# Patient Record
Sex: Female | Born: 1970
Health system: Southern US, Community
[De-identification: ages and names within clinical notes are randomized; demographics above are authoritative.]

## PROBLEM LIST (undated history)

## (undated) DIAGNOSIS — F329 Major depressive disorder, single episode, unspecified: Secondary | ICD-10-CM

## (undated) DIAGNOSIS — I10 Essential (primary) hypertension: Secondary | ICD-10-CM

## (undated) DIAGNOSIS — E119 Type 2 diabetes mellitus without complications: Secondary | ICD-10-CM

## (undated) DIAGNOSIS — G4730 Sleep apnea, unspecified: Secondary | ICD-10-CM

## (undated) DIAGNOSIS — F419 Anxiety disorder, unspecified: Secondary | ICD-10-CM

## (undated) DIAGNOSIS — A048 Other specified bacterial intestinal infections: Secondary | ICD-10-CM

## (undated) DIAGNOSIS — M1990 Unspecified osteoarthritis, unspecified site: Secondary | ICD-10-CM

## (undated) DIAGNOSIS — G2581 Restless legs syndrome: Secondary | ICD-10-CM

## (undated) DIAGNOSIS — K219 Gastro-esophageal reflux disease without esophagitis: Secondary | ICD-10-CM

## (undated) DIAGNOSIS — E785 Hyperlipidemia, unspecified: Secondary | ICD-10-CM

## (undated) DIAGNOSIS — G4700 Insomnia, unspecified: Secondary | ICD-10-CM

## (undated) DIAGNOSIS — F909 Attention-deficit hyperactivity disorder, unspecified type: Secondary | ICD-10-CM

## (undated) DIAGNOSIS — F32A Depression, unspecified: Secondary | ICD-10-CM

## (undated) DIAGNOSIS — M797 Fibromyalgia: Secondary | ICD-10-CM

## (undated) DIAGNOSIS — K589 Irritable bowel syndrome without diarrhea: Secondary | ICD-10-CM

## (undated) DIAGNOSIS — N946 Dysmenorrhea, unspecified: Secondary | ICD-10-CM

## (undated) HISTORY — DX: Fibromyalgia: M79.7

## (undated) HISTORY — DX: Irritable bowel syndrome, unspecified: K58.9

## (undated) HISTORY — DX: Depression, unspecified: F32.A

## (undated) HISTORY — DX: Major depressive disorder, single episode, unspecified: F32.9

## (undated) HISTORY — DX: Type 2 diabetes mellitus without complications: E11.9

## (undated) HISTORY — DX: Sleep apnea, unspecified: G47.30

## (undated) HISTORY — DX: Dysmenorrhea, unspecified: N94.6

## (undated) HISTORY — DX: Restless legs syndrome: G25.81

## (undated) HISTORY — DX: Anxiety disorder, unspecified: F41.9

## (undated) HISTORY — DX: Insomnia, unspecified: G47.00

## (undated) HISTORY — DX: Other specified bacterial intestinal infections: A04.8

## (undated) HISTORY — PX: KNEE ARTHROSCOPY: SHX127

## (undated) HISTORY — DX: Essential (primary) hypertension: I10

## (undated) HISTORY — DX: Hyperlipidemia, unspecified: E78.5

## (undated) HISTORY — DX: Attention-deficit hyperactivity disorder, unspecified type: F90.9

## (undated) HISTORY — DX: Unspecified osteoarthritis, unspecified site: M19.90

## (undated) HISTORY — DX: Gastro-esophageal reflux disease without esophagitis: K21.9

---
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
## 1998-01-18 ENCOUNTER — Inpatient Hospital Stay (HOSPITAL_COMMUNITY): Admission: AD | Admit: 1998-01-18 | Discharge: 1998-01-19 | Payer: Self-pay | Admitting: Obstetrics

## 1998-01-26 ENCOUNTER — Inpatient Hospital Stay (HOSPITAL_COMMUNITY): Admission: RE | Admit: 1998-01-26 | Discharge: 1998-01-27 | Payer: Self-pay | Admitting: Obstetrics

## 1998-01-30 ENCOUNTER — Inpatient Hospital Stay (HOSPITAL_COMMUNITY): Admission: AD | Admit: 1998-01-30 | Discharge: 1998-01-31 | Payer: Self-pay | Admitting: *Deleted

## 1998-02-08 ENCOUNTER — Observation Stay (HOSPITAL_COMMUNITY): Admission: AD | Admit: 1998-02-08 | Discharge: 1998-02-09 | Payer: Self-pay | Admitting: *Deleted

## 1998-02-23 ENCOUNTER — Inpatient Hospital Stay (HOSPITAL_COMMUNITY): Admission: AD | Admit: 1998-02-23 | Discharge: 1998-02-23 | Payer: Self-pay | Admitting: Obstetrics

## 1998-03-15 ENCOUNTER — Inpatient Hospital Stay (HOSPITAL_COMMUNITY): Admission: AD | Admit: 1998-03-15 | Discharge: 1998-03-15 | Payer: Self-pay | Admitting: *Deleted

## 1998-03-17 ENCOUNTER — Encounter: Admission: RE | Admit: 1998-03-17 | Discharge: 1998-06-15 | Payer: Self-pay | Admitting: Obstetrics

## 1998-03-23 ENCOUNTER — Inpatient Hospital Stay (HOSPITAL_COMMUNITY): Admission: AD | Admit: 1998-03-23 | Discharge: 1998-03-23 | Payer: Self-pay | Admitting: Obstetrics

## 1998-03-27 ENCOUNTER — Inpatient Hospital Stay (HOSPITAL_COMMUNITY): Admission: AD | Admit: 1998-03-27 | Discharge: 1998-03-27 | Payer: Self-pay | Admitting: Obstetrics

## 1998-03-30 ENCOUNTER — Observation Stay (HOSPITAL_COMMUNITY): Admission: AD | Admit: 1998-03-30 | Discharge: 1998-03-31 | Payer: Self-pay | Admitting: Obstetrics

## 1998-04-06 ENCOUNTER — Inpatient Hospital Stay (HOSPITAL_COMMUNITY): Admission: AD | Admit: 1998-04-06 | Discharge: 1998-04-06 | Payer: Self-pay | Admitting: Obstetrics

## 1998-04-09 ENCOUNTER — Inpatient Hospital Stay (HOSPITAL_COMMUNITY): Admission: AD | Admit: 1998-04-09 | Discharge: 1998-04-09 | Payer: Self-pay | Admitting: Obstetrics

## 1998-04-12 ENCOUNTER — Inpatient Hospital Stay (HOSPITAL_COMMUNITY): Admission: AD | Admit: 1998-04-12 | Discharge: 1998-04-12 | Payer: Self-pay | Admitting: *Deleted

## 1998-04-17 ENCOUNTER — Inpatient Hospital Stay (HOSPITAL_COMMUNITY): Admission: AD | Admit: 1998-04-17 | Discharge: 1998-04-17 | Payer: Self-pay | Admitting: *Deleted

## 1998-04-18 ENCOUNTER — Observation Stay (HOSPITAL_COMMUNITY): Admission: AD | Admit: 1998-04-18 | Discharge: 1998-04-19 | Payer: Self-pay | Admitting: Obstetrics

## 1998-04-23 ENCOUNTER — Inpatient Hospital Stay (HOSPITAL_COMMUNITY): Admission: AD | Admit: 1998-04-23 | Discharge: 1998-04-23 | Payer: Self-pay | Admitting: Obstetrics

## 1998-04-29 ENCOUNTER — Inpatient Hospital Stay (HOSPITAL_COMMUNITY): Admission: AD | Admit: 1998-04-29 | Discharge: 1998-04-29 | Payer: Self-pay | Admitting: *Deleted

## 1998-04-30 ENCOUNTER — Inpatient Hospital Stay (HOSPITAL_COMMUNITY): Admission: AD | Admit: 1998-04-30 | Discharge: 1998-04-30 | Payer: Self-pay | Admitting: Obstetrics & Gynecology

## 1998-05-04 ENCOUNTER — Encounter (HOSPITAL_COMMUNITY): Admission: RE | Admit: 1998-05-04 | Discharge: 1998-05-09 | Payer: Self-pay | Admitting: Obstetrics

## 1998-05-06 ENCOUNTER — Inpatient Hospital Stay (HOSPITAL_COMMUNITY): Admission: AD | Admit: 1998-05-06 | Discharge: 1998-05-06 | Payer: Self-pay | Admitting: Obstetrics & Gynecology

## 1998-05-06 ENCOUNTER — Inpatient Hospital Stay (HOSPITAL_COMMUNITY): Admission: AD | Admit: 1998-05-06 | Discharge: 1998-05-10 | Payer: Self-pay | Admitting: Obstetrics & Gynecology

## 2001-05-19 ENCOUNTER — Other Ambulatory Visit: Admission: RE | Admit: 2001-05-19 | Discharge: 2001-05-19 | Payer: Self-pay | Admitting: Family Medicine

## 2002-03-13 ENCOUNTER — Encounter: Admission: RE | Admit: 2002-03-13 | Discharge: 2002-03-13 | Payer: Self-pay | Admitting: Family Medicine

## 2002-03-13 ENCOUNTER — Encounter: Payer: Self-pay | Admitting: Family Medicine

## 2002-12-17 HISTORY — PX: DILATION AND CURETTAGE OF UTERUS: SHX78

## 2003-05-05 ENCOUNTER — Encounter: Payer: Self-pay | Admitting: Family Medicine

## 2003-05-05 ENCOUNTER — Encounter: Admission: RE | Admit: 2003-05-05 | Discharge: 2003-05-05 | Payer: Self-pay | Admitting: Family Medicine

## 2003-06-12 ENCOUNTER — Inpatient Hospital Stay (HOSPITAL_COMMUNITY): Admission: AD | Admit: 2003-06-12 | Discharge: 2003-06-12 | Payer: Self-pay | Admitting: Obstetrics & Gynecology

## 2003-06-13 ENCOUNTER — Inpatient Hospital Stay (HOSPITAL_COMMUNITY): Admission: AD | Admit: 2003-06-13 | Discharge: 2003-06-13 | Payer: Self-pay | Admitting: Obstetrics & Gynecology

## 2003-06-20 ENCOUNTER — Inpatient Hospital Stay (HOSPITAL_COMMUNITY): Admission: AD | Admit: 2003-06-20 | Discharge: 2003-06-21 | Payer: Self-pay | Admitting: Obstetrics

## 2003-06-21 ENCOUNTER — Encounter: Payer: Self-pay | Admitting: Obstetrics

## 2003-06-28 ENCOUNTER — Inpatient Hospital Stay (HOSPITAL_COMMUNITY): Admission: AD | Admit: 2003-06-28 | Discharge: 2003-06-30 | Payer: Self-pay | Admitting: Obstetrics & Gynecology

## 2003-06-29 ENCOUNTER — Encounter: Payer: Self-pay | Admitting: Obstetrics & Gynecology

## 2003-07-05 ENCOUNTER — Encounter (INDEPENDENT_AMBULATORY_CARE_PROVIDER_SITE_OTHER): Payer: Self-pay | Admitting: Specialist

## 2003-07-05 ENCOUNTER — Observation Stay (HOSPITAL_COMMUNITY): Admission: RE | Admit: 2003-07-05 | Discharge: 2003-07-06 | Payer: Self-pay | Admitting: Obstetrics & Gynecology

## 2003-10-07 ENCOUNTER — Emergency Department (HOSPITAL_COMMUNITY): Admission: EM | Admit: 2003-10-07 | Discharge: 2003-10-07 | Payer: Self-pay | Admitting: Emergency Medicine

## 2004-07-19 ENCOUNTER — Ambulatory Visit (HOSPITAL_COMMUNITY): Admission: RE | Admit: 2004-07-19 | Discharge: 2004-07-19 | Payer: Self-pay | Admitting: Obstetrics & Gynecology

## 2004-07-25 ENCOUNTER — Ambulatory Visit (HOSPITAL_COMMUNITY): Admission: RE | Admit: 2004-07-25 | Discharge: 2004-07-25 | Payer: Self-pay | Admitting: Gastroenterology

## 2004-10-12 ENCOUNTER — Encounter: Admission: RE | Admit: 2004-10-12 | Discharge: 2004-10-12 | Payer: Self-pay | Admitting: Rheumatology

## 2006-05-26 ENCOUNTER — Ambulatory Visit (HOSPITAL_BASED_OUTPATIENT_CLINIC_OR_DEPARTMENT_OTHER): Admission: RE | Admit: 2006-05-26 | Discharge: 2006-05-26 | Payer: Self-pay | Admitting: Family Medicine

## 2006-06-02 ENCOUNTER — Ambulatory Visit: Payer: Self-pay | Admitting: Internal Medicine

## 2006-12-17 HISTORY — PX: BUNIONECTOMY: SHX129

## 2007-04-08 ENCOUNTER — Inpatient Hospital Stay (HOSPITAL_COMMUNITY): Admission: AD | Admit: 2007-04-08 | Discharge: 2007-04-08 | Payer: Self-pay | Admitting: Obstetrics & Gynecology

## 2007-07-15 ENCOUNTER — Emergency Department (HOSPITAL_COMMUNITY): Admission: EM | Admit: 2007-07-15 | Discharge: 2007-07-16 | Payer: Self-pay | Admitting: Emergency Medicine

## 2008-06-06 ENCOUNTER — Inpatient Hospital Stay (HOSPITAL_COMMUNITY): Admission: AD | Admit: 2008-06-06 | Discharge: 2008-06-07 | Payer: Self-pay | Admitting: Obstetrics & Gynecology

## 2009-03-14 ENCOUNTER — Encounter: Admission: RE | Admit: 2009-03-14 | Discharge: 2009-03-14 | Payer: Self-pay | Admitting: Family Medicine

## 2009-05-11 ENCOUNTER — Other Ambulatory Visit: Admission: RE | Admit: 2009-05-11 | Discharge: 2009-05-11 | Payer: Self-pay | Admitting: Family Medicine

## 2010-07-25 ENCOUNTER — Emergency Department (HOSPITAL_COMMUNITY): Admission: EM | Admit: 2010-07-25 | Discharge: 2010-07-26 | Payer: Self-pay | Admitting: Emergency Medicine

## 2011-01-07 ENCOUNTER — Encounter: Payer: Self-pay | Admitting: Family Medicine

## 2011-01-07 ENCOUNTER — Encounter: Payer: Self-pay | Admitting: Obstetrics & Gynecology

## 2011-03-02 LAB — COMPREHENSIVE METABOLIC PANEL
ALT: 17 U/L (ref 0–35)
AST: 17 U/L (ref 0–37)
Albumin: 3.8 g/dL (ref 3.5–5.2)
Alkaline Phosphatase: 82 U/L (ref 39–117)
BUN: 12 mg/dL (ref 6–23)
CO2: 24 mEq/L (ref 19–32)
Calcium: 9 mg/dL (ref 8.4–10.5)
Chloride: 106 mEq/L (ref 96–112)
Creatinine, Ser: 0.75 mg/dL (ref 0.4–1.2)
GFR calc Af Amer: >60 mL/min (ref >60)
GFR calc non Af Amer: >60 mL/min (ref >60)
Glucose, Bld: 123 mg/dL — ABNORMAL HIGH (ref 70–99)
Potassium: 3.6 mEq/L (ref 3.5–5.1)
Sodium: 139 mEq/L (ref 135–145)
Total Bilirubin: 0.4 mg/dL (ref 0.3–1.2)
Total Protein: 7.5 g/dL (ref 6.0–8.3)

## 2011-03-02 LAB — URINALYSIS, ROUTINE W REFLEX MICROSCOPIC
Bilirubin Urine: NEGATIVE
Glucose, UA: NEGATIVE mg/dL
Ketones, ur: NEGATIVE mg/dL
Nitrite: NEGATIVE
Protein, ur: NEGATIVE mg/dL
Specific Gravity, Urine: 1.029 (ref 1.005–1.030)
Urobilinogen, UA: 1 mg/dL (ref 0.0–1.0)
pH: 6 (ref 5.0–8.0)

## 2011-03-02 LAB — WET PREP, GENITAL
Trich, Wet Prep: NONE SEEN
Yeast Wet Prep HPF POC: NONE SEEN

## 2011-03-02 LAB — CBC
HCT: 41.4 % (ref 36.0–46.0)
Hemoglobin: 14.1 g/dL (ref 12.0–15.0)
MCH: 28.5 pg (ref 26.0–34.0)
MCHC: 34 g/dL (ref 30.0–36.0)
MCV: 83.8 fL (ref 78.0–100.0)
Platelets: 300 10*3/uL (ref 150–400)
RBC: 4.94 MIL/uL (ref 3.87–5.11)
RDW: 15.2 % (ref 11.5–15.5)
WBC: 7.3 10*3/uL (ref 4.0–10.5)

## 2011-03-02 LAB — URINE MICROSCOPIC-ADD ON

## 2011-03-02 LAB — DIFFERENTIAL
Basophils Absolute: 0 10*3/uL (ref 0.0–0.1)
Basophils Relative: 0 % (ref 0–1)
Eosinophils Absolute: 0.1 10*3/uL (ref 0.0–0.7)
Eosinophils Relative: 1 % (ref 0–5)
Lymphocytes Relative: 38 % (ref 12–46)
Lymphs Abs: 2.8 10*3/uL (ref 0.7–4.0)
Monocytes Absolute: 0.5 10*3/uL (ref 0.1–1.0)
Monocytes Relative: 7 % (ref 3–12)
Neutro Abs: 3.8 10*3/uL (ref 1.7–7.7)
Neutrophils Relative %: 53 % (ref 43–77)

## 2011-03-02 LAB — PREGNANCY, URINE: Preg Test, Ur: NEGATIVE

## 2011-03-02 LAB — LIPASE, BLOOD: Lipase: 33 U/L (ref 11–59)

## 2011-03-02 LAB — GC/CHLAMYDIA PROBE AMP, GENITAL
Chlamydia, DNA Probe: NEGATIVE
GC Probe Amp, Genital: NEGATIVE

## 2011-05-04 NOTE — Op Note (Signed)
   NAMEZOHA, Goodman                           ACCOUNT NO.:  [id number]   MEDICAL RECORD NO.:  [id number]                   PATIENT TYPE:  OBV   LOCATION:  9308                                 FACILITY:  [facility]   PHYSICIAN:  Roseanna Rainbow, M.D.         DATE OF BIRTH:  [birth year]   DATE OF PROCEDURE:  07/05/2003  DATE OF DISCHARGE:                                 OPERATIVE REPORT   PREOPERATIVE DIAGNOSIS:  Embryonic demise.   POSTOPERATIVE DIAGNOSIS:  Embryonic demise.   PROCEDURE:  Suction dilatation and curettage.   SURGEON:  Roseanna Rainbow, M.D.   ANESTHESIA:  Managed anesthesia care, paracervical block.   ESTIMATED BLOOD LOSS:  Approximately 50 mL.   COMPLICATIONS:  None.   PROCEDURE:  The patient was taken to the operating room where she was placed  in the dorsal lithotomy position and prepped and draped in the usual sterile  fashion.  A sterile speculum was then placed inside the patient's vagina.  The anterior lip of the cervix was infiltrated with 2 mL of 2% Xylocaine.  The single-tooth tenaculum was then applied to this location.  Four mL of 2%  Xylocaine were then injected at 5 and 7 o'clock to use as a paracervical  block.  The cervix was then dilated with the [facility] dilators.  A 7-mm suction  curette was then advanced into the intrauterine cavity.  The device was  activated and rotated to evacuate the uterus of the products of conception.  The sharp curettage was then performed until a gritty texture was noted.  The suction curette was then reintroduced into the intrauterine cavity and  the remaining products of conception were evacuated.  The single-tooth  tenaculum was then removed with minimal bleeding noted.  At the closure of  the procedure the instrument and pack counts were said to be correct x2.  The patient was taken to the PACU awake and  in stable condition.   PATHOLOGY:  Products of conception.        Roseanna Rainbow, M.D.    [name]  D:  07/05/2003  T:  07/05/2003  Job:  [id number]

## 2011-05-04 NOTE — Discharge Summary (Signed)
[name], [name]                           ACCOUNT NO.:  [id number]   MEDICAL RECORD NO.:  [id number]                   PATIENT TYPE:  INP   LOCATION:  9104                                 FACILITY:  [facility]   PHYSICIAN:  Roseanna Rainbow, M.D.         DATE OF BIRTH:  Jan 27, 1971   DATE OF ADMISSION:  06/28/2003  DATE OF DISCHARGE:  06/30/2003                                 DISCHARGE SUMMARY   CHIEF COMPLAINT:  The patient is a 40 year old, gravida 3, para 1-0-1-1, at  17 and 3/7ths week estimated gestational age with complaints of nausea and  vomiting.   HISTORY OF PRESENT ILLNESS:  The patient also complains of a headache.  Prenatal course, source of care [facility], pregnancy complications and risks,  nausea and vomiting of pregnancy with multiple presentations to maternity  and admissions with dehydration.   MEDICATIONS:  Phenergan.   ALLERGIES:  NO KNOWN DRUG ALLERGIES.   PAST OBSTETRIC AND GYNECOLOGIC HISTORY:  She is status post one NSVD and one  spontaneous abortion.   PAST MEDICAL HISTORY:  She denies.   PAST SURGICAL HISTORY:  She denies.   FAMILY AND SOCIAL HISTORY:  No tobacco, ethanol, or illicit drug use.   The most recent ultrasound at 8 weeks, 1 day with questionable viability.   PHYSICAL EXAMINATION:  VITAL SIGNS:  Temperature 97.7, pulse 88, respiratory  rate 20, blood pressure 114/63.  GENERAL:  Appears fatigued.  ABDOMEN:  Nontender.  PELVIC:  Deferred.  EXTREMITIES:  Nontender.   LABORATORY WORK:  White blood cell count 5600, hemoglobin 14.2.  Complete  metabolic profile normal.  Urinalysis with a specific gravity of 1.030, and  ketones greater than 80.   ASSESSMENT:  1. Hyperemesis gravidarum with mild dehydration refractory to current     antiemetics.  2. Questionable viable early intrauterine pregnancy.   PLAN:  Admit.  Steroid taper, and repeat the ultrasound.   HOSPITAL COURSE:  The patient was admitted.  Lab work from the office  revealed a positive H. pylori result.  The ultrasound revealed a nonviable  intrauterine pregnancy.  There were no findings consistent with a molar  pregnancy.  A beta hCG was 72,000.  Blood type was noted to be B+.  Her  nausea and vomiting had improved significantly, and she was tolerating a  regular diet.  The steroid taper was discontinued.  The plan was to  discharge her home with treatment for H. pylori as follows - Prilosec 20 mg  p.o. b.i.d., amoxicillin 1 gm p.o. b.i.d., Biaxin 500 mg p.o. b.i.d.  This  regimen was to be taken for 14 days.   DISCHARGE DIAGNOSIS:  Hyperemesis gravidarum.  Nonviable early intrauterine  pregnancy consistent with an embryonic demise.   CONDITION:  Stable.   DIET:  Regular.   ACTIVITY:  Ad lib.   MEDICATIONS:  1. Amoxicillin.  2. Biaxin.  3. Prilosec.   DISPOSITION:  The patient  was to follow up in the office in four weeks.                                               Roseanna Rainbow, M.D.    [name]  D:  08/08/2003  T:  08/08/2003  Job:  [id number]

## 2011-05-04 NOTE — Procedures (Signed)
[name], BAU NO.:  [id number]   MEDICAL RECORD NO.:  [id number]          PATIENT TYPE:  OUT   LOCATION:  SLEEP CENTER                 [facility]ACILITY[facility]   PHYSICIAN:  Clinton D. Maple Hudson, M.D. DATE OF BIRTH:  16-Jan-1971   DATE OF STUDY:  05/26/2006                              NOCTURNAL POLYSOMNOGRAM   REFERRING PHYSICIAN:  Dr. Elias Else   INDICATIONS FOR STUDY:  Hypersomnia with sleep apnea.   EPWORTH SLEEPINESS SCORE:  17/24.   BMI:  34.   WEIGHT:  212 pounds.   HOME MEDICATIONS:  Lexapro, Wellbutrin, D-amphetamine.   SLEEP ARCHITECTURE:  Total sleep time 432 minutes with sleep efficiency 91%.  Stage I was 5%, stage II 74%, stages III and IV 3%, REM 18% of total sleep  time.  Sleep latency 16 minutes, REM latency 83 minutes, awake after sleep  onset 28 minutes, arousal index 16.1.   RESPIRATORY DATA:  Apnea/hypopnea index (AHI, RDI) 2.2 obstructive events  per hour which is within normal limits (normal range 0 to 5 per hour).  This  reflected a total of 16 hypopneas.  Most events occurred while sleeping  supine (AHI 5.0) suggesting positional relationship.  REM AHI was 0.8 per  hour.  There were insufficient events to qualify for CPAP titration by split  protocol on the study night.   OXYGEN DATA:  Moderately loud snoring with oxygen desaturation to a nadir of  90%.  Mean oxygen saturation through the study was 97% on room air.   CARDIAC DATA:  Normal sinus rhythm.   MOVEMENT/PARASOMNIA:  A total of 50 limb jerks were recorded of which five  were associated with arousal or awakening for periodic limb movement with  arousal index of 0.7 per hour which is insignificant.   IMPRESSION/RECOMMENDATIONS:  1.  Overall sleep architecture was unremarkable.  There is some mild tossing      and turning with sleep fragmentation but this study does not suggest a      significant sleep disorder as the basis for her sleep complaints.      Effect on sleep of  her medications and adequacy of sleep hygiene at home      should be discussed.  2.  Occasional sleep disordered breathing events, a AHI 2.2 per hour which      is within normal limits (normal range 0 to 5 per hour).      Clinton D. Maple Hudson, M.D.  Diplomate, Biomedical engineer of Sleep Medicine  Electronically Signed     CDY/MEDQ  D:  06/02/2006 08:58:55  T:  06/03/2006 10:00:05  Job:  [id number]

## 2011-06-06 ENCOUNTER — Emergency Department (HOSPITAL_COMMUNITY)
Admission: EM | Admit: 2011-06-06 | Discharge: 2011-06-06 | Disposition: A | Discharge status: Home / Self Care | Payer: Medicare Other | Attending: Emergency Medicine | Admitting: Emergency Medicine

## 2011-06-06 DIAGNOSIS — R109 Unspecified abdominal pain: Secondary | ICD-10-CM | POA: Insufficient documentation

## 2011-06-06 DIAGNOSIS — M62838 Other muscle spasm: Secondary | ICD-10-CM | POA: Insufficient documentation

## 2011-06-06 DIAGNOSIS — F341 Dysthymic disorder: Secondary | ICD-10-CM | POA: Insufficient documentation

## 2011-06-06 LAB — POCT PREGNANCY, URINE: Preg Test, Ur: NEGATIVE

## 2011-06-06 LAB — URINALYSIS, ROUTINE W REFLEX MICROSCOPIC
Bilirubin Urine: NEGATIVE
Glucose, UA: NEGATIVE mg/dL
Hgb urine dipstick: NEGATIVE
Ketones, ur: NEGATIVE mg/dL
Leukocytes, UA: NEGATIVE
Nitrite: NEGATIVE
Protein, ur: NEGATIVE mg/dL
Specific Gravity, Urine: 1.033 — ABNORMAL HIGH (ref 1.005–1.030)
Urobilinogen, UA: 1 mg/dL (ref 0.0–1.0)
pH: 5 (ref 5.0–8.0)

## 2011-09-05 ENCOUNTER — Telehealth: Payer: Self-pay | Admitting: Gastroenterology

## 2011-09-05 NOTE — Telephone Encounter (Signed)
Pt having problems swallowing. Pt scheduled to see Willette Cluster NP 09/06/11@8 :Neta Ehlers will notify pt of appt date and time and fax records.

## 2011-09-06 ENCOUNTER — Ambulatory Visit: Payer: Medicare Other | Admitting: Nurse Practitioner

## 2011-09-13 LAB — CBC
HCT: 40.1
Hemoglobin: 13.6
MCHC: 33.9
MCV: 88.4
Platelets: 293
RBC: 4.53
RDW: 14.4
WBC: 7.2

## 2011-09-13 LAB — WET PREP, GENITAL
Clue Cells Wet Prep HPF POC: NONE SEEN
Trich, Wet Prep: NONE SEEN
Yeast Wet Prep HPF POC: NONE SEEN

## 2011-09-13 LAB — POCT PREGNANCY, URINE
Operator id: 223331
Preg Test, Ur: NEGATIVE

## 2011-09-13 LAB — DIFFERENTIAL
Basophils Absolute: 0.1
Basophils Relative: 1
Eosinophils Absolute: 0.1
Eosinophils Relative: 2
Lymphocytes Relative: 42
Lymphs Abs: 3
Monocytes Absolute: 0.5
Monocytes Relative: 6
Neutro Abs: 3.6
Neutrophils Relative %: 50

## 2011-09-13 LAB — URINALYSIS, ROUTINE W REFLEX MICROSCOPIC
Bilirubin Urine: NEGATIVE
Glucose, UA: NEGATIVE
Hgb urine dipstick: NEGATIVE
Ketones, ur: NEGATIVE
Nitrite: NEGATIVE
Protein, ur: NEGATIVE
Specific Gravity, Urine: 1.015
Urobilinogen, UA: 1
pH: 7

## 2011-09-13 LAB — GC/CHLAMYDIA PROBE AMP, GENITAL
Chlamydia, DNA Probe: NEGATIVE
GC Probe Amp, Genital: NEGATIVE

## 2011-10-01 LAB — URINALYSIS, ROUTINE W REFLEX MICROSCOPIC
Glucose, UA: NEGATIVE
Hgb urine dipstick: NEGATIVE
Nitrite: NEGATIVE
Protein, ur: NEGATIVE
Specific Gravity, Urine: 1.024
Urobilinogen, UA: 2 — ABNORMAL HIGH
pH: 7.5

## 2011-10-01 LAB — COMPREHENSIVE METABOLIC PANEL
ALT: 19
AST: 21
Albumin: 3.2 — ABNORMAL LOW
Alkaline Phosphatase: 75
BUN: 4 — ABNORMAL LOW
CO2: 26
Calcium: 8 — ABNORMAL LOW
Chloride: 101
Creatinine, Ser: 0.7
GFR calc Af Amer: >60
GFR calc non Af Amer: >60
Glucose, Bld: 99
Potassium: 3.4 — ABNORMAL LOW
Sodium: 134 — ABNORMAL LOW
Total Bilirubin: 0.9
Total Protein: 6.7

## 2011-10-01 LAB — DIFFERENTIAL
Basophils Absolute: 0
Basophils Relative: 0
Eosinophils Absolute: 0
Eosinophils Relative: 0
Lymphocytes Relative: 20
Lymphs Abs: 0.9
Monocytes Absolute: 0.3
Monocytes Relative: 8
Neutro Abs: 3.2
Neutrophils Relative %: 72

## 2011-10-01 LAB — LIPASE, BLOOD: Lipase: 16

## 2011-10-01 LAB — CBC
HCT: 39.5
Hemoglobin: 13.7
MCHC: 34.8
MCV: 82.6
Platelets: 259
RBC: 4.77
RDW: 13.5
WBC: 4.5

## 2011-11-14 ENCOUNTER — Encounter: Payer: Self-pay | Admitting: Gastroenterology

## 2011-11-14 ENCOUNTER — Ambulatory Visit (INDEPENDENT_AMBULATORY_CARE_PROVIDER_SITE_OTHER): Payer: Medicare Other | Admitting: Gastroenterology

## 2011-11-14 DIAGNOSIS — K219 Gastro-esophageal reflux disease without esophagitis: Secondary | ICD-10-CM | POA: Insufficient documentation

## 2011-11-14 DIAGNOSIS — R1310 Dysphagia, unspecified: Secondary | ICD-10-CM

## 2011-11-14 DIAGNOSIS — R1314 Dysphagia, pharyngoesophageal phase: Secondary | ICD-10-CM | POA: Insufficient documentation

## 2011-11-14 MED ORDER — PANTOPRAZOLE SODIUM 40 MG PO TBEC: 40.0000 mg | DELAYED_RELEASE_TABLET | Freq: Every day | ORAL | Status: DC

## 2011-11-14 NOTE — Assessment & Plan Note (Signed)
This likely is due to a peptic stricture.  Recommendations #1 upper endoscopy with dilatation as indicated  Risks, alternatives, and complications of the procedure, including bleeding, perforation, and possible need for surgery, were explained to the patient.  Patient's questions were answered.

## 2011-11-14 NOTE — Assessment & Plan Note (Signed)
Plan to begin Protonix 40 mg daily 

## 2011-11-14 NOTE — Patient Instructions (Signed)
Your procedure has been scheduled for 11/16/2011, please follow the seperate instructions.  CC: [name]

## 2011-11-14 NOTE — Progress Notes (Signed)
History of Present Illness: [name] is a pleasant 40 year old African female,  with history of gastroesophageal reflux,  referred at the request of Dr. Aquilla Solian for evaluation of dysphagia. She is complaining of dysphagia to solids. When this occurs she may have moderate chest discomfort. She's also complaining of severe pyrosis. Weight has been stable. She is on no gastric irritants.    Past Medical History  Diagnosis Date  . ADD (attention deficit disorder with hyperactivity)   . Anxiety   . Allergic rhinitis   . GERD (gastroesophageal reflux disease)   . Insomnia   . Dysmenorrhea   . Fibromyalgia   . Restless leg syndrome    Past Surgical History  Procedure Date  . Ear cyst excision 1983    Left  . Bunionectomy 2008  . Dilation and curettage of uterus 2004   family history includes Breast cancer in her maternal grandmother; Diabetes in her sister; and Heart disease in her brother.  There is no history of Colon cancer. Current Outpatient Prescriptions  Medication Sig Dispense Refill  . cyclobenzaprine (FLEXERIL) 10 MG tablet Take 10 mg by mouth 3 (three) times daily as needed.        . DULoxetine (CYMBALTA) 30 MG capsule Take 30 mg by mouth 2 (two) times daily.        . ergocalciferol (VITAMIN D2) 50000 UNITS capsule Take 50,000 Units by mouth once a week. Once a week.       . lidocaine (LIDODERM) 5 % Place 1 patch onto the skin daily. Remove & Discard patch within 12 hours or as directed by MD       . methocarbamol (ROBAXIN) 500 MG tablet Take 500 mg by mouth 2 (two) times daily. 1 tab twice a day.      . traMADol (ULTRAM) 50 MG tablet Take 50 mg by mouth every 6 (six) hours as needed.         Allergies as of 11/14/2011  . (No Known Allergies)    reports that she has quit smoking. She has never used smokeless tobacco. She reports that she does not drink alcohol or use illicit drugs.     Review of Systems: Pertinent positive and negative review of systems were noted  in the above HPI section. All other review of systems were otherwise negative.  Vital signs were reviewed in today's medical record Physical Exam: General: Well developed , well nourished, no acute distress Head: Normocephalic and atraumatic Eyes:  sclerae anicteric, EOMI Ears: Normal auditory acuity Mouth: No deformity or lesions Neck: Supple, no masses or thyromegaly Lungs: Clear throughout to auscultation Heart: Regular rate and rhythm; no murmurs, rubs or bruits Abdomen: Soft, non tender and non distended. No masses, hepatosplenomegaly or hernias noted. Normal Bowel sounds Rectal:deferred Musculoskeletal: Symmetrical with no gross deformities  Skin: No lesions on visible extremities Pulses:  Normal pulses noted Extremities: No clubbing, cyanosis, edema or deformities noted Neurological: Alert oriented x 4, grossly nonfocal Cervical Nodes:  No significant cervical adenopathy Inguinal Nodes: No significant inguinal adenopathy Psychological:  Alert and cooperative. Normal mood and affect

## 2011-11-16 ENCOUNTER — Encounter: Payer: Self-pay | Admitting: Gastroenterology

## 2011-11-16 ENCOUNTER — Ambulatory Visit (AMBULATORY_SURGERY_CENTER): Payer: Medicare Other | Admitting: Gastroenterology

## 2011-11-16 DIAGNOSIS — K219 Gastro-esophageal reflux disease without esophagitis: Secondary | ICD-10-CM

## 2011-11-16 DIAGNOSIS — D131 Benign neoplasm of stomach: Secondary | ICD-10-CM

## 2011-11-16 DIAGNOSIS — K2960 Other gastritis without bleeding: Secondary | ICD-10-CM

## 2011-11-16 DIAGNOSIS — R1310 Dysphagia, unspecified: Secondary | ICD-10-CM

## 2011-11-16 DIAGNOSIS — A048 Other specified bacterial intestinal infections: Secondary | ICD-10-CM

## 2011-11-16 MED ORDER — SODIUM CHLORIDE 0.9 % IV SOLN: 500.0000 mL | INTRAVENOUS | Status: DC

## 2011-11-16 NOTE — Patient Instructions (Signed)
Please refer to blue and green discharge instruction sheets. 

## 2011-11-16 NOTE — Progress Notes (Signed)
Patient did not experience any of the following events: a burn prior to discharge; a fall within the facility; wrong site/side/patient/procedure/implant event; or a hospital transfer or hospital admission upon discharge from the facility. (G8907) Patient did not have preoperative order for IV antibiotic SSI prophylaxis. (G8918)  

## 2011-11-19 ENCOUNTER — Telehealth: Payer: Self-pay | Admitting: *Deleted

## 2011-11-19 NOTE — Telephone Encounter (Signed)
LMOM

## 2011-11-27 ENCOUNTER — Encounter: Payer: Self-pay | Admitting: *Deleted

## 2011-11-28 ENCOUNTER — Telehealth: Payer: Self-pay

## 2011-11-28 MED ORDER — AMOXICILL-CLARITHRO-LANSOPRAZ PO MISC: Freq: Two times a day (BID) | ORAL | Status: DC

## 2011-11-28 NOTE — Telephone Encounter (Signed)
Letter from: Solon Palm,  Needs prevpak    Pt aware and rx sent to pharmacy.

## 2011-11-29 ENCOUNTER — Telehealth: Payer: Self-pay | Admitting: *Deleted

## 2011-11-29 NOTE — Telephone Encounter (Signed)
Dr Arlyce Dice, Pt cant afford Prevpac. Can she have Pylera??

## 2011-11-30 MED ORDER — BIS SUBCIT-METRONID-TETRACYC 140-125-125 MG PO CAPS: 3.0000 | ORAL_CAPSULE | Freq: Four times a day (QID) | ORAL | Status: DC

## 2011-11-30 MED ORDER — OMEPRAZOLE 20 MG PO CPDR: 20.0000 mg | DELAYED_RELEASE_CAPSULE | Freq: Two times a day (BID) | ORAL | Status: DC

## 2011-11-30 NOTE — Telephone Encounter (Signed)
Try pylera aureus the medicines prescribed individually

## 2011-11-30 NOTE — Telephone Encounter (Signed)
Resent in Pylera and omeprazole for pt Informed pt that new med was sent for her

## 2011-12-04 ENCOUNTER — Telehealth: Payer: Self-pay | Admitting: *Deleted

## 2011-12-04 NOTE — Telephone Encounter (Signed)
Left message for pt that h we have done a prior authorization for Prevpac. They would not approve Pylera It is in coverage review now

## 2011-12-05 MED ORDER — CLARITHROMYCIN 500 MG PO TABS: 500.0000 mg | ORAL_TABLET | Freq: Two times a day (BID) | ORAL | Status: AC

## 2011-12-05 MED ORDER — AMOXICILLIN 500 MG PO CAPS: 500.0000 mg | ORAL_CAPSULE | Freq: Two times a day (BID) | ORAL | Status: AC

## 2011-12-05 NOTE — Telephone Encounter (Signed)
Did not apporve prevac. Had to order the medication separate. Will see if this goes through

## 2011-12-06 NOTE — Telephone Encounter (Signed)
Pt was approved for her medication separately,  Pt called and told me she has it

## 2011-12-21 ENCOUNTER — Telehealth: Payer: Self-pay | Admitting: Gastroenterology

## 2011-12-21 NOTE — Telephone Encounter (Signed)
Pt states she is having difficulty swallowing again. Pt scheduled to see Dr. Arlyce Dice 12/26/11@12noon .

## 2011-12-26 ENCOUNTER — Encounter: Payer: Self-pay | Admitting: Gastroenterology

## 2011-12-26 ENCOUNTER — Ambulatory Visit (INDEPENDENT_AMBULATORY_CARE_PROVIDER_SITE_OTHER): Payer: Medicare Other | Admitting: Gastroenterology

## 2011-12-26 VITALS — BP 110/80 | HR 84 | Ht 66.0 in | Wt 230.6 lb

## 2011-12-26 DIAGNOSIS — R1310 Dysphagia, unspecified: Secondary | ICD-10-CM

## 2011-12-26 NOTE — Progress Notes (Signed)
History of Present Illness:  [name] has returned following upper endoscopy with dilatation. No frank stricture was seen although the patient noted significant improvement in her dysphagia to solids and to pills. A hyperplastic polyp was seen in the stomach. She was treated with  Prevpac for H. pylori. She is now complaining of dysphagia to pills. This is a significant problem. She feels like food is stuck in  her neck. She is without abdominal pain or chest pain    Review of Systems: Pertinent positive and negative review of systems were noted in the above HPI section. All other review of systems were otherwise negative.    Current Medications, Allergies, Past Medical History, Past Surgical History, Family History and Social History were reviewed in [employer] electronic medical record  Vital signs were reviewed in today's medical record. Physical Exam: General: Well developed , well nourished, no acute distress

## 2011-12-26 NOTE — Assessment & Plan Note (Addendum)
Recurrent dysphagia is likely related to a stricture. A motility disorder is less likely.  Recommendations #1 repeat endoscopy with dilatation. If she continues to have problems then I would proceed with barium swallow and possible manometry.

## 2011-12-26 NOTE — Patient Instructions (Signed)
Your procedure has been scheduled for 01/01/2012, please follow the seperate instructions.

## 2012-01-01 ENCOUNTER — Encounter: Payer: Self-pay | Admitting: Gastroenterology

## 2012-01-01 ENCOUNTER — Ambulatory Visit (AMBULATORY_SURGERY_CENTER): Payer: Medicare Other | Admitting: Gastroenterology

## 2012-01-01 VITALS — BP 132/70 | HR 79 | Temp 98.1°F | Resp 21 | Ht 66.0 in | Wt 230.0 lb

## 2012-01-01 DIAGNOSIS — R1310 Dysphagia, unspecified: Secondary | ICD-10-CM

## 2012-01-01 NOTE — Progress Notes (Signed)
Patient did not experience any of the following events: a burn prior to discharge; a fall within the facility; wrong site/side/patient/procedure/implant event; or a hospital transfer or hospital admission upon discharge from the facility. (G8907) Patient did not have preoperative order for IV antibiotic SSI prophylaxis. (G8918)  

## 2012-01-01 NOTE — Op Note (Signed)
[facility] [address] [place], [place]  [address]  ENDOSCOPY PROCEDURE REPORT  PATIENT:  [name], [name]  MR#:  [id number] BIRTHDATE:  06-13-1971, 40 yrs. old  GENDER:  female  ENDOSCOPIST:  Barbette Hair. Arlyce Dice, MD Referred by:  Elias Else, M.D.  PROCEDURE DATE:  01/01/2012 PROCEDURE:  EGD, diagnostic 43235, Maloney Dilation of Esophagus ASA CLASS:  Class II INDICATIONS:  dysphagia  MEDICATIONS:   MAC sedation, administered by CRNA propofol 130mg IV, glycopyrrolate (Robinal) 0.2 mg IV, 0.6cc simethancone 0.6 cc PO TOPICAL ANESTHETIC:  DESCRIPTION OF PROCEDURE:   After the risks and benefits of the procedure were explained, informed consent was obtained.  The LB GIF-H180 [id number] endoscope was introduced through the mouth and advanced to the third portion of the duodenum.  The instrument was slowly withdrawn as the mucosa was fully examined. <<PROCEDUREIMAGES>>  Otherwise the examination was normal (see image2, image3, image1, and image4). Dilation with maloney dilator 18mm Minimal resistance; no heme    Retroflexed views revealed no abnormalities.    The scope was then withdrawn from the patient and the procedure completed.  COMPLICATIONS:  None  ENDOSCOPIC IMPRESSION: 1) Otherwise normal examination - s/p maloney dilitation  RECOMMENDATIONS:Barium swallow for any further swallowing difficulties  ______________________________ Barbette Hair. Arlyce Dice, MD  CC:  n. eSIGNED:   Barbette Hair. Advait Buice at 01/01/2012 09:13 AM  [place], [place], [phone]

## 2012-01-01 NOTE — Patient Instructions (Addendum)
FOLLOW DISCHARGE INSTRUCTIONS (BLUE AND GREEN SHEETS)..FOLLOW DILATION DIET TODAY

## 2012-01-01 NOTE — Progress Notes (Signed)
The pt tolerated the rgd very well. Maw  Propofol was administered by Iline Oven, CRNA. maw

## 2012-01-02 ENCOUNTER — Telehealth: Payer: Self-pay | Admitting: *Deleted

## 2012-01-02 NOTE — Telephone Encounter (Signed)
No answer, left message

## 2012-07-26 ENCOUNTER — Other Ambulatory Visit: Payer: Self-pay | Admitting: Gastroenterology

## 2012-09-21 ENCOUNTER — Other Ambulatory Visit: Payer: Self-pay | Admitting: Gastroenterology

## 2013-02-01 ENCOUNTER — Encounter (HOSPITAL_COMMUNITY): Payer: Self-pay | Admitting: Emergency Medicine

## 2013-02-01 ENCOUNTER — Emergency Department (HOSPITAL_COMMUNITY): Payer: Medicare Other

## 2013-02-01 ENCOUNTER — Emergency Department (HOSPITAL_COMMUNITY)
Admission: EM | Admit: 2013-02-01 | Discharge: 2013-02-01 | Disposition: A | Discharge status: Home / Self Care | Payer: Medicare Other | Attending: Emergency Medicine | Admitting: Emergency Medicine

## 2013-02-01 DIAGNOSIS — K219 Gastro-esophageal reflux disease without esophagitis: Secondary | ICD-10-CM | POA: Insufficient documentation

## 2013-02-01 DIAGNOSIS — Z9889 Other specified postprocedural states: Secondary | ICD-10-CM | POA: Insufficient documentation

## 2013-02-01 DIAGNOSIS — F411 Generalized anxiety disorder: Secondary | ICD-10-CM | POA: Insufficient documentation

## 2013-02-01 DIAGNOSIS — R52 Pain, unspecified: Secondary | ICD-10-CM | POA: Insufficient documentation

## 2013-02-01 DIAGNOSIS — Z8669 Personal history of other diseases of the nervous system and sense organs: Secondary | ICD-10-CM | POA: Insufficient documentation

## 2013-02-01 DIAGNOSIS — IMO0001 Reserved for inherently not codable concepts without codable children: Secondary | ICD-10-CM | POA: Insufficient documentation

## 2013-02-01 DIAGNOSIS — Z8659 Personal history of other mental and behavioral disorders: Secondary | ICD-10-CM | POA: Insufficient documentation

## 2013-02-01 DIAGNOSIS — R197 Diarrhea, unspecified: Secondary | ICD-10-CM | POA: Insufficient documentation

## 2013-02-01 DIAGNOSIS — R112 Nausea with vomiting, unspecified: Secondary | ICD-10-CM

## 2013-02-01 DIAGNOSIS — Z8709 Personal history of other diseases of the respiratory system: Secondary | ICD-10-CM | POA: Insufficient documentation

## 2013-02-01 DIAGNOSIS — Z79899 Other long term (current) drug therapy: Secondary | ICD-10-CM | POA: Insufficient documentation

## 2013-02-01 DIAGNOSIS — Z87891 Personal history of nicotine dependence: Secondary | ICD-10-CM | POA: Insufficient documentation

## 2013-02-01 DIAGNOSIS — Z8742 Personal history of other diseases of the female genital tract: Secondary | ICD-10-CM | POA: Insufficient documentation

## 2013-02-01 DIAGNOSIS — R000 Tachycardia, unspecified: Secondary | ICD-10-CM | POA: Insufficient documentation

## 2013-02-01 DIAGNOSIS — G4700 Insomnia, unspecified: Secondary | ICD-10-CM | POA: Insufficient documentation

## 2013-02-01 DIAGNOSIS — Z3202 Encounter for pregnancy test, result negative: Secondary | ICD-10-CM | POA: Insufficient documentation

## 2013-02-01 DIAGNOSIS — R109 Unspecified abdominal pain: Secondary | ICD-10-CM | POA: Insufficient documentation

## 2013-02-01 LAB — URINALYSIS, ROUTINE W REFLEX MICROSCOPIC
Glucose, UA: NEGATIVE mg/dL
Hgb urine dipstick: NEGATIVE
Nitrite: NEGATIVE
Protein, ur: 30 mg/dL — AB
Specific Gravity, Urine: 1.031 — ABNORMAL HIGH (ref 1.005–1.030)
Urobilinogen, UA: 1 mg/dL (ref 0.0–1.0)
pH: 6.5 (ref 5.0–8.0)

## 2013-02-01 LAB — CBC WITH DIFFERENTIAL/PLATELET
Basophils Absolute: 0 10*3/uL (ref 0.0–0.1)
Basophils Relative: 0 % (ref 0–1)
Eosinophils Absolute: 0.1 10*3/uL (ref 0.0–0.7)
Eosinophils Relative: 1 % (ref 0–5)
HCT: 38.5 % (ref 36.0–46.0)
Hemoglobin: 12.1 g/dL (ref 12.0–15.0)
Lymphocytes Relative: 7 % — ABNORMAL LOW (ref 12–46)
Lymphs Abs: 0.7 10*3/uL (ref 0.7–4.0)
MCH: 23.4 pg — ABNORMAL LOW (ref 26.0–34.0)
MCHC: 31.4 g/dL (ref 30.0–36.0)
MCV: 74.3 fL — ABNORMAL LOW (ref 78.0–100.0)
Monocytes Absolute: 0.4 10*3/uL (ref 0.1–1.0)
Monocytes Relative: 4 % (ref 3–12)
Neutro Abs: 8.8 10*3/uL — ABNORMAL HIGH (ref 1.7–7.7)
Neutrophils Relative %: 88 % — ABNORMAL HIGH (ref 43–77)
Platelets: 332 10*3/uL (ref 150–400)
RBC: 5.18 MIL/uL — ABNORMAL HIGH (ref 3.87–5.11)
RDW: 15.5 % (ref 11.5–15.5)
WBC: 10 10*3/uL (ref 4.0–10.5)

## 2013-02-01 LAB — COMPREHENSIVE METABOLIC PANEL
ALT: 11 U/L (ref 0–35)
AST: 12 U/L (ref 0–37)
Albumin: 3.6 g/dL (ref 3.5–5.2)
Alkaline Phosphatase: 91 U/L (ref 39–117)
BUN: 8 mg/dL (ref 6–23)
CO2: 23 mEq/L (ref 19–32)
Calcium: 8.9 mg/dL (ref 8.4–10.5)
Chloride: 100 mEq/L (ref 96–112)
Creatinine, Ser: 0.77 mg/dL (ref 0.50–1.10)
GFR calc Af Amer: >90 mL/min (ref >90)
GFR calc non Af Amer: >90 mL/min (ref >90)
Glucose, Bld: 141 mg/dL — ABNORMAL HIGH (ref 70–99)
Potassium: 3.5 mEq/L (ref 3.5–5.1)
Sodium: 137 mEq/L (ref 135–145)
Total Bilirubin: 0.4 mg/dL (ref 0.3–1.2)
Total Protein: 8.1 g/dL (ref 6.0–8.3)

## 2013-02-01 LAB — LIPASE, BLOOD: Lipase: 18 U/L (ref 11–59)

## 2013-02-01 LAB — SAMPLE TO BLOOD BANK

## 2013-02-01 LAB — URINE MICROSCOPIC-ADD ON

## 2013-02-01 LAB — PREGNANCY, URINE: Preg Test, Ur: NEGATIVE

## 2013-02-01 MED ORDER — ONDANSETRON 8 MG PO TBDP: 8.0000 mg | ORAL_TABLET | Freq: Three times a day (TID) | ORAL | Status: DC | PRN

## 2013-02-01 MED ORDER — SODIUM CHLORIDE 0.9 % IV BOLUS (SEPSIS)
1000.0000 mL | Freq: Once | INTRAVENOUS | Status: AC
  Administered 2013-02-01: 1000 mL via INTRAVENOUS

## 2013-02-01 MED ORDER — ONDANSETRON HCL 4 MG/2ML IJ SOLN
4.0000 mg | Freq: Once | INTRAMUSCULAR | Status: AC
  Administered 2013-02-01: 4 mg via INTRAVENOUS
  Filled 2013-02-01: qty 2

## 2013-02-01 MED ORDER — MORPHINE SULFATE 4 MG/ML IJ SOLN
4.0000 mg | Freq: Once | INTRAMUSCULAR | Status: AC
  Administered 2013-02-01: 4 mg via INTRAVENOUS
  Filled 2013-02-01: qty 1

## 2013-02-01 NOTE — ED Notes (Signed)
Patient is alert and oriented x3.  She is complaining of mid abdominal to right side pain that  Started yesterday and is accompanied nausea, vomiting and diarrhea.

## 2013-02-01 NOTE — ED Notes (Signed)
Pt alert, arrives from home, c/o gen abd pain, onset last pm, describes pain as sharp, radiating to right, resp even unlabored, skin pwd, states n/v

## 2013-02-01 NOTE — Discharge Instructions (Signed)
Abdominal Pain  Abdominal pain can be caused by many things. Your caregiver decides the seriousness of your pain by an examination and possibly blood tests and X-rays. Many cases can be observed and treated at home. Most abdominal pain is not caused by a disease and will probably improve without treatment. However, in many cases, more time must pass before a clear cause of the pain can be found. Before that point, it may not be known if you need more testing, or if hospitalization or surgery is needed.  HOME CARE INSTRUCTIONS    Do not take laxatives unless directed by your caregiver.   Take pain medicine only as directed by your caregiver.   Only take over-the-counter or prescription medicines for pain, discomfort, or fever as directed by your caregiver.   Try a clear liquid diet (broth, tea, or water) for as long as directed by your caregiver. Slowly move to a bland diet as tolerated.  SEEK IMMEDIATE MEDICAL CARE IF:    The pain does not go away.   You have a fever.   You keep throwing up (vomiting).   The pain is felt only in portions of the abdomen. Pain in the right side could possibly be appendicitis. In an adult, pain in the left lower portion of the abdomen could be colitis or diverticulitis.   You pass bloody or black tarry stools.  MAKE SURE YOU:    Understand these instructions.   Will watch your condition.   Will get help right away if you are not doing well or get worse.  Document Released: 09/12/2005 Document Revised: 02/25/2012 Document Reviewed: 07/21/2008  ExitCare Patient Information 2013 [employer].      Nausea and Vomiting  Nausea is a sick feeling that often comes before throwing up (vomiting). Vomiting is a reflex where stomach contents come out of your mouth. Vomiting can cause severe loss of body fluids (dehydration). Children and elderly adults can become dehydrated quickly, especially if they also have diarrhea. Nausea and vomiting are symptoms of a condition or disease. It  is important to find the cause of your symptoms.  CAUSES    Direct irritation of the stomach lining. This irritation can result from increased acid production (gastroesophageal reflux disease), infection, food poisoning, taking certain medicines (such as nonsteroidal anti-inflammatory drugs), alcohol use, or tobacco use.   Signals from the brain.These signals could be caused by a headache, heat exposure, an inner ear disturbance, increased pressure in the brain from injury, infection, a tumor, or a concussion, pain, emotional stimulus, or metabolic problems.   An obstruction in the gastrointestinal tract (bowel obstruction).   Illnesses such as diabetes, hepatitis, gallbladder problems, appendicitis, kidney problems, cancer, sepsis, atypical symptoms of a heart attack, or eating disorders.   Medical treatments such as chemotherapy and radiation.   Receiving medicine that makes you sleep (general anesthetic) during surgery.  DIAGNOSIS  Your caregiver may ask for tests to be done if the problems do not improve after a few days. Tests may also be done if symptoms are severe or if the reason for the nausea and vomiting is not clear. Tests may include:   Urine tests.   Blood tests.   Stool tests.   Cultures (to look for evidence of infection).   X-rays or other imaging studies.  Test results can help your caregiver make decisions about treatment or the need for additional tests.  TREATMENT  You need to stay well hydrated. Drink frequently but in small amounts.You may   wish to drink water, sports drinks, clear broth, or eat frozen ice pops or gelatin dessert to help stay hydrated.When you eat, eating slowly may help prevent nausea.There are also some antinausea medicines that may help prevent nausea.  HOME CARE INSTRUCTIONS    Take all medicine as directed by your caregiver.   If you do not have an appetite, do not force yourself to eat. However, you must continue to drink fluids.   If you have an  appetite, eat a normal diet unless your caregiver tells you differently.   Eat a variety of complex carbohydrates (rice, wheat, potatoes, bread), lean meats, yogurt, fruits, and vegetables.   Avoid high-fat foods because they are more difficult to digest.   Drink enough water and fluids to keep your urine clear or pale yellow.   If you are dehydrated, ask your caregiver for specific rehydration instructions. Signs of dehydration may include:   Severe thirst.   Dry lips and mouth.   Dizziness.   Dark urine.   Decreasing urine frequency and amount.   Confusion.   Rapid breathing or pulse.  SEEK IMMEDIATE MEDICAL CARE IF:    You have blood or brown flecks (like coffee grounds) in your vomit.   You have black or bloody stools.   You have a severe headache or stiff neck.   You are confused.   You have severe abdominal pain.   You have chest pain or trouble breathing.   You do not urinate at least once every 8 hours.   You develop cold or clammy skin.   You continue to vomit for longer than 24 to 48 hours.   You have a fever.  MAKE SURE YOU:    Understand these instructions.   Will watch your condition.   Will get help right away if you are not doing well or get worse.  Document Released: 12/03/2005 Document Revised: 02/25/2012 Document Reviewed: 05/02/2011  [employer] Patient Information 2013 [employer].

## 2013-02-01 NOTE — ED Provider Notes (Addendum)
History     CSN: [id number]  Arrival date & time 02/01/13  7829   First MD Initiated Contact with Patient 02/01/13 [phone]      Chief Complaint  Patient presents with  . Abdominal Pain    (Consider location/radiation/quality/duration/timing/severity/associated sxs/prior treatment) HPI 42 year old female presents to the emergency department with complaint of midline abdominal pain, nausea vomiting and diarrhea. Symptoms started last night around 10. They have been persistent. She denies any fever. No sick contacts, no travel, no unusual foods. No prior abdominal surgeries. Pain has been constant. She has history of fibromyalgia. Last menstrual period was 2 weeks ago.  Past Medical History  Diagnosis Date  . ADD (attention deficit disorder with hyperactivity)   . Anxiety   . Allergic rhinitis   . GERD (gastroesophageal reflux disease)   . Insomnia   . Dysmenorrhea   . Fibromyalgia   . Restless leg syndrome     Past Surgical History  Procedure Laterality Date  . Ear cyst excision  1983    Left  . Bunionectomy  2008    left  . Dilation and curettage of uterus  2004    Family History  Problem Relation Age of Onset  . Breast cancer Maternal Grandmother   . Diabetes Sister   . Heart disease Brother   . Colon cancer Neg Hx     History  Substance Use Topics  . Smoking status: Former Games developer  . Smokeless tobacco: Never Used  . Alcohol Use: No    OB History   Grav Para Term Preterm Abortions TAB SAB Ect Mult Living                  Review of Systems  All other systems reviewed and are negative.    Allergies  Review of patient's allergies indicates no known allergies.  Home Medications   Current Outpatient Rx  Name  Route  Sig  Dispense  Refill  . cyclobenzaprine (FLEXERIL) 10 MG tablet   Oral   Take 10 mg by mouth 3 (three) times daily as needed.           . DULoxetine (CYMBALTA) 30 MG capsule   Oral   Take 30 mg by mouth 2 (two) times daily.            . ergocalciferol (VITAMIN D2) 50000 UNITS capsule   Oral   Take 50,000 Units by mouth once a week. Once a week.          . lidocaine (LIDODERM) 5 %   Transdermal   Place 1 patch onto the skin daily. Remove & Discard patch within 12 hours or as directed by MD          . methocarbamol (ROBAXIN) 500 MG tablet   Oral   Take 500 mg by mouth 2 (two) times daily. 1 tab twice a day.         Marland Kitchen omeprazole (PRILOSEC) 20 MG capsule      TAKE ONE CAPSULE BY MOUTH TWICE A DAY   30 capsule   1   . EXPIRED: pantoprazole (PROTONIX) 40 MG tablet   Oral   Take 1 tablet (40 mg total) by mouth daily.   30 tablet   1   . traMADol (ULTRAM) 50 MG tablet   Oral   Take 50 mg by mouth every 6 (six) hours as needed.             BP 122/81  Pulse 139  Temp(Src) 98  F (36.7 C)  Resp 96  SpO2 96%  LMP 01/17/2013  Physical Exam  Nursing note and vitals reviewed. Constitutional: She is oriented to person, place, and time. She appears well-developed and well-nourished. She appears distressed (uncomfortable-appearing).  HENT:  Head: Normocephalic and atraumatic.  Nose: Nose normal.  Mouth/Throat: Oropharynx is clear and moist.  Eyes: Conjunctivae and EOM are normal. Pupils are equal, round, and reactive to light.  Neck: Normal range of motion. Neck supple. No JVD present. No tracheal deviation present. No thyromegaly present.  Cardiovascular: Normal rate, regular rhythm, normal heart sounds and intact distal pulses.  Exam reveals no gallop and no friction rub.   No murmur heard. Pulmonary/Chest: Effort normal and breath sounds normal. No stridor. No respiratory distress. She has no wheezes. She has no rales. She exhibits no tenderness.  Abdominal: Soft. Bowel sounds are normal. She exhibits no distension and no mass. There is tenderness (diffuse abdominal tenderness, worse in right upper quadrant and midline of abdominal pain. Abdomen is soft. No rebound or guarding). There is no rebound  and no guarding.  Musculoskeletal: Normal range of motion. She exhibits no edema and no tenderness.  Lymphadenopathy:    She has no cervical adenopathy.  Neurological: She is alert and oriented to person, place, and time. She has normal reflexes. She exhibits normal muscle tone. Coordination normal.  Skin: Skin is warm and dry. No rash noted. No erythema. No pallor.  Psychiatric: She has a normal mood and affect. Her behavior is normal. Judgment and thought content normal.    ED Course  Procedures (including critical care time)  Labs Reviewed  URINALYSIS, ROUTINE W REFLEX MICROSCOPIC - Abnormal; Notable for the following:    Color, Urine AMBER (*)    APPearance CLOUDY (*)    Specific Gravity, Urine 1.031 (*)    Bilirubin Urine SMALL (*)    Ketones, ur TRACE (*)    Protein, ur 30 (*)    Leukocytes, UA SMALL (*)    All other components within normal limits  CBC WITH DIFFERENTIAL - Abnormal; Notable for the following:    RBC 5.18 (*)    MCV 74.3 (*)    MCH 23.4 (*)    Neutrophils Relative 88 (*)    Neutro Abs 8.8 (*)    Lymphocytes Relative 7 (*)    All other components within normal limits  COMPREHENSIVE METABOLIC PANEL - Abnormal; Notable for the following:    Glucose, Bld 141 (*)    All other components within normal limits  URINE MICROSCOPIC-ADD ON - Abnormal; Notable for the following:    Squamous Epithelial / LPF MANY (*)    All other components within normal limits  LIPASE, BLOOD  PREGNANCY, URINE  SAMPLE TO BLOOD BANK   No results found.   Date: 02/01/2013  Rate: 127  Rhythm: sinus tachycardia  QRS Axis: normal  Intervals: normal  ST/T Wave abnormalities: normal  Conduction Disutrbances:none  Narrative Interpretation:   Old EKG Reviewed: none available    1. Abdominal pain, acute   2. Nausea vomiting and diarrhea       MDM  42 year old female with abdominal pain associated with nausea vomiting diarrhea. Patient has significant tachycardia. We'll give  IV fluids, pain medicine, Zofran, we'll check baseline labs. Expect patient will need either ultrasound for possible cholecystitis or CT scan he        Olivia Mackie, MD 02/01/13 0606  Pt re-examined.  She reports pain is still worse in midline, but pain  now only in RUQ.  Will get u/s of abd for possible cholelithiasis.  Labs unremarkable.  No further vomiting.  Pain improved after morphine.  Will redose.  Pt will be signed out to oncoming team pending results of u/s.  Olivia Mackie, MD 02/01/13 [phone]

## 2013-02-01 NOTE — ED Notes (Signed)
Voiced understanding of instructions given 

## 2013-12-20 ENCOUNTER — Other Ambulatory Visit: Payer: Self-pay | Admitting: Gastroenterology

## 2014-01-16 ENCOUNTER — Encounter (HOSPITAL_COMMUNITY): Payer: Self-pay | Admitting: Emergency Medicine

## 2014-01-16 ENCOUNTER — Emergency Department (HOSPITAL_COMMUNITY): Payer: Medicare Other

## 2014-01-16 ENCOUNTER — Emergency Department (HOSPITAL_COMMUNITY)
Admission: EM | Admit: 2014-01-16 | Discharge: 2014-01-16 | Disposition: A | Discharge status: Home / Self Care | Payer: Medicare Other | Attending: Emergency Medicine | Admitting: Emergency Medicine

## 2014-01-16 DIAGNOSIS — Z87891 Personal history of nicotine dependence: Secondary | ICD-10-CM | POA: Insufficient documentation

## 2014-01-16 DIAGNOSIS — K219 Gastro-esophageal reflux disease without esophagitis: Secondary | ICD-10-CM | POA: Insufficient documentation

## 2014-01-16 DIAGNOSIS — Z8669 Personal history of other diseases of the nervous system and sense organs: Secondary | ICD-10-CM | POA: Insufficient documentation

## 2014-01-16 DIAGNOSIS — IMO0001 Reserved for inherently not codable concepts without codable children: Secondary | ICD-10-CM | POA: Insufficient documentation

## 2014-01-16 DIAGNOSIS — M62830 Muscle spasm of back: Secondary | ICD-10-CM

## 2014-01-16 DIAGNOSIS — F411 Generalized anxiety disorder: Secondary | ICD-10-CM | POA: Insufficient documentation

## 2014-01-16 DIAGNOSIS — R109 Unspecified abdominal pain: Secondary | ICD-10-CM | POA: Insufficient documentation

## 2014-01-16 DIAGNOSIS — Z8742 Personal history of other diseases of the female genital tract: Secondary | ICD-10-CM | POA: Insufficient documentation

## 2014-01-16 DIAGNOSIS — Z79899 Other long term (current) drug therapy: Secondary | ICD-10-CM | POA: Insufficient documentation

## 2014-01-16 DIAGNOSIS — M5380 Other specified dorsopathies, site unspecified: Secondary | ICD-10-CM | POA: Insufficient documentation

## 2014-01-16 MED ORDER — METHOCARBAMOL 500 MG PO TABS: 500.0000 mg | ORAL_TABLET | Freq: Two times a day (BID) | ORAL | Status: DC | PRN

## 2014-01-16 NOTE — ED Provider Notes (Signed)
Medical screening examination/treatment/procedure(s) were performed by non-physician practitioner and as supervising physician I was immediately available for consultation/collaboration.  EKG Interpretation   None        Orlie Dakin, MD 01/16/14 2126

## 2014-01-16 NOTE — Discharge Instructions (Signed)
Take the prescribed medication as directed. Follow-up with your primary care physician if no improvement in the next few days. Return to the ED for new or worsening symptoms.

## 2014-01-16 NOTE — ED Provider Notes (Signed)
CSN: [id number]     Arrival date & time 01/16/14  1819 History  This chart was scribed for non-physician practitioner, Quincy Carnes, PA-C,working with Orlie Dakin, MD, by Marlowe Kays, ED Scribe.  This patient was seen in room WTR7/WTR7 and the patient's care was started at 7:16 PM.  Chief Complaint  Patient presents with  . Shortness of Breath    pain in l/ribs and shortness of breath   The history is provided by the patient. No language interpreter was used.   HPI Comments:  Rexann Lueras Comer-Steverson is a 43 y.o. female who presents to the Emergency Department complaining of shooting left flank pain that radiates to her mid-back. No recent injuries, trauma, or falls.  She states it may be a muscle spasm that has not resolved yet as it feels "tight and stiff"-- pt has hx of same with similar sx. She reports twisting, pulling, coughing, deep breathing, and any other movement makes the pain worse. The pain is reproducible with palpation. Pt states she has applied heat and ice compresses without relief.  She has also had her daughter massage her back which seemed to make sx worse.  She denies numbness or paresthesias of extremities. She also notes hx of fibromyalgia.  VS stable on arrival.  Past Medical History  Diagnosis Date  . ADD (attention deficit disorder with hyperactivity)   . Anxiety   . Allergic rhinitis   . GERD (gastroesophageal reflux disease)   . Insomnia   . Dysmenorrhea   . Fibromyalgia   . Restless leg syndrome    Past Surgical History  Procedure Laterality Date  . Ear cyst excision  1983    Left  . Bunionectomy  2008    left  . Dilation and curettage of uterus  2004   Family History  Problem Relation Age of Onset  . Breast cancer Maternal Grandmother   . Diabetes Sister   . Heart disease Brother   . Colon cancer Neg Hx    History  Substance Use Topics  . Smoking status: Former Research scientist (life sciences)  . Smokeless tobacco: Never Used  . Alcohol Use: No   OB History    Grav Para Term Preterm Abortions TAB SAB Ect Mult Living                 Review of Systems  Musculoskeletal: Positive for back pain.  All other systems reviewed and are negative.    Allergies  Review of patient's allergies indicates no known allergies.  Home Medications   Current Outpatient Rx  Name  Route  Sig  Dispense  Refill  . cyclobenzaprine (FLEXERIL) 10 MG tablet   Oral   Take 10 mg by mouth 3 (three) times daily as needed for muscle spasms. For muscle spasms         . DULoxetine (CYMBALTA) 30 MG capsule   Oral   Take 30 mg by mouth daily.         . ergocalciferol (VITAMIN D2) 50000 UNITS capsule   Oral   Take 50,000 Units by mouth once a week. Once a week.          . lidocaine (LIDODERM) 5 %   Transdermal   Place 1 patch onto the skin daily. Remove & Discard patch within 12 hours or as directed by MD          . omeprazole (PRILOSEC) 20 MG capsule      TAKE ONE CAPSULE BY MOUTH TWICE A DAY   30  capsule   6   . ondansetron (ZOFRAN-ODT) 8 MG disintegrating tablet   Oral   Take 1 tablet (8 mg total) by mouth every 8 (eight) hours as needed for nausea.   10 tablet   0   . EXPIRED: pantoprazole (PROTONIX) 40 MG tablet   Oral   Take 1 tablet (40 mg total) by mouth daily.   30 tablet   1   . traMADol (ULTRAM) 50 MG tablet   Oral   Take 50 mg by mouth every 6 (six) hours as needed for pain. For pain          Triage Vitals: BP 141/90  Pulse 87  Temp(Src) 97.9 F (36.6 C) (Oral)  Resp 18  Wt 212 lb (96.163 kg)  SpO2 100%  Physical Exam  Nursing note and vitals reviewed. Constitutional: She is oriented to person, place, and time. She appears well-developed and well-nourished.  HENT:  Head: Normocephalic and atraumatic.  Mouth/Throat: Oropharynx is clear and moist.  Eyes: Conjunctivae and EOM are normal. Pupils are equal, round, and reactive to light.  Neck: Normal range of motion.  Cardiovascular: Normal rate, regular rhythm and normal  heart sounds.   Pulmonary/Chest: Effort normal and breath sounds normal. No accessory muscle usage. No respiratory distress. She has no wheezes. She has no rhonchi.  Tenderness to palpation of left, lower, posterior and lateral ribs and paraspinal regions. Mild spasm present.  No bruising or deformity. No crepitus or flail segment. Lungs CTAB.  Musculoskeletal: Normal range of motion. She exhibits tenderness. She exhibits no edema.  Neurological: She is alert and oriented to person, place, and time.  Skin: Skin is warm and dry. No rash noted. Rash is not pustular, not vesicular and not urticarial.  Psychiatric: She has a normal mood and affect. Her behavior is normal.    ED Course  Procedures (including critical care time) DIAGNOSTIC STUDIES: Oxygen Saturation is 100% on RA, normal by my interpretation.   COORDINATION OF CARE: 7:20 PM- Will prescribe muscle relaxer. Pt verbalizes understanding and agrees to plan.  Medications - No data to display  Labs Review Labs Reviewed - No data to display Imaging Review Dg Chest 2 View  01/16/2014   CLINICAL DATA:  Shortness of breath and back pain  EXAM: CHEST  2 VIEW  COMPARISON:  None.  FINDINGS: The heart size and mediastinal contours are within normal limits. Both lungs are clear. The visualized skeletal structures are unremarkable.  IMPRESSION: No active cardiopulmonary disease.   Electronically Signed   By: Skipper Cliche M.D.   On: 01/16/2014 19:09    EKG Interpretation   None       MDM   1. Muscle spasm of back    Chest x-ray is clear.  During evaluation and exam patient does not complaining of cough or SOB to me although this was documented by triage nurse.  Patient's vital signs have remained stable. She has no tachycardia or hypoxia to suggest PE.  Feel sx likely MSK in nature as pain is reproducible with palpation.  She will be started on Robaxin. She is instructed to followup with her primary care physician if no improvement of  symptoms within the next few days.  Discussed plan with pt, she acknowledged understanding and agreed.  Return precautions advised.  I personally performed the services described in this documentation, which was scribed in my presence. The recorded information has been reviewed and is accurate.  Larene Pickett, PA-C 01/16/14 1945

## 2014-01-16 NOTE — ED Notes (Signed)
Pain in l/rib area and shortness of breath x 4 days. Denies fever. Reports occasional cough

## 2014-11-01 ENCOUNTER — Emergency Department (HOSPITAL_COMMUNITY)
Admission: EM | Admit: 2014-11-01 | Discharge: 2014-11-01 | Disposition: A | Discharge status: Home / Self Care | Payer: Medicare Other | Attending: Emergency Medicine | Admitting: Emergency Medicine

## 2014-11-01 ENCOUNTER — Emergency Department (HOSPITAL_COMMUNITY): Payer: Medicare Other

## 2014-11-01 ENCOUNTER — Encounter (HOSPITAL_COMMUNITY): Payer: Self-pay | Admitting: *Deleted

## 2014-11-01 DIAGNOSIS — K219 Gastro-esophageal reflux disease without esophagitis: Secondary | ICD-10-CM | POA: Insufficient documentation

## 2014-11-01 DIAGNOSIS — M797 Fibromyalgia: Secondary | ICD-10-CM | POA: Insufficient documentation

## 2014-11-01 DIAGNOSIS — Y998 Other external cause status: Secondary | ICD-10-CM | POA: Insufficient documentation

## 2014-11-01 DIAGNOSIS — Z79899 Other long term (current) drug therapy: Secondary | ICD-10-CM | POA: Insufficient documentation

## 2014-11-01 DIAGNOSIS — S92911A Unspecified fracture of right toe(s), initial encounter for closed fracture: Secondary | ICD-10-CM

## 2014-11-01 DIAGNOSIS — X58XXXA Exposure to other specified factors, initial encounter: Secondary | ICD-10-CM | POA: Insufficient documentation

## 2014-11-01 DIAGNOSIS — S92411A Displaced fracture of proximal phalanx of right great toe, initial encounter for closed fracture: Secondary | ICD-10-CM | POA: Insufficient documentation

## 2014-11-01 DIAGNOSIS — Y9389 Activity, other specified: Secondary | ICD-10-CM | POA: Insufficient documentation

## 2014-11-01 DIAGNOSIS — Z8742 Personal history of other diseases of the female genital tract: Secondary | ICD-10-CM | POA: Insufficient documentation

## 2014-11-01 DIAGNOSIS — G2581 Restless legs syndrome: Secondary | ICD-10-CM | POA: Insufficient documentation

## 2014-11-01 DIAGNOSIS — W010XXA Fall on same level from slipping, tripping and stumbling without subsequent striking against object, initial encounter: Secondary | ICD-10-CM | POA: Insufficient documentation

## 2014-11-01 DIAGNOSIS — G4700 Insomnia, unspecified: Secondary | ICD-10-CM | POA: Insufficient documentation

## 2014-11-01 DIAGNOSIS — F419 Anxiety disorder, unspecified: Secondary | ICD-10-CM | POA: Insufficient documentation

## 2014-11-01 DIAGNOSIS — Y9289 Other specified places as the place of occurrence of the external cause: Secondary | ICD-10-CM | POA: Insufficient documentation

## 2014-11-01 DIAGNOSIS — Z87891 Personal history of nicotine dependence: Secondary | ICD-10-CM | POA: Insufficient documentation

## 2014-11-01 DIAGNOSIS — T1490XA Injury, unspecified, initial encounter: Secondary | ICD-10-CM

## 2014-11-01 DIAGNOSIS — Z8709 Personal history of other diseases of the respiratory system: Secondary | ICD-10-CM | POA: Insufficient documentation

## 2014-11-01 DIAGNOSIS — W108XXA Fall (on) (from) other stairs and steps, initial encounter: Secondary | ICD-10-CM | POA: Insufficient documentation

## 2014-11-01 MED ORDER — HYDROCODONE-ACETAMINOPHEN 5-325 MG PO TABS: 1.0000 | ORAL_TABLET | ORAL | Status: DC | PRN

## 2014-11-01 MED ORDER — NAPROXEN 375 MG PO TABS: 375.0000 mg | ORAL_TABLET | Freq: Two times a day (BID) | ORAL | Status: DC

## 2014-11-01 NOTE — ED Notes (Signed)
Bed: WA22 Expected date:  Expected time:  Means of arrival:  Comments: 

## 2014-11-01 NOTE — ED Notes (Signed)
Pt states that she slipped down 2 stairs and injured rt foot; pt c/o pain behind the toe area; pt reports swelling to ankle area; pt able to move ankle without difficulty; pt c/o tenderness upon palpation to top of foot and pain upon standing

## 2014-11-01 NOTE — ED Provider Notes (Signed)
CSN: [id number]     Arrival date & time 11/01/14  0654 History   First MD Initiated Contact with Patient 11/01/14 [phone]     Chief Complaint  Patient presents with  . Foot Injury     (Consider location/radiation/quality/duration/timing/severity/associated sxs/prior Treatment) HPI   Patient to the ER with complaints of right toe pain. She slipped yesterday or early this morning and twister her ankle and stubbed her toe. She is having pain with walking, touching her right great toe and ROM. She has taken Ibuprofen for pain with some mild relief. She denies loc, hitting her head or neck pain.   Past Medical History  Diagnosis Date  . ADD (attention deficit disorder with hyperactivity)   . Anxiety   . Allergic rhinitis   . GERD (gastroesophageal reflux disease)   . Insomnia   . Dysmenorrhea   . Fibromyalgia   . Restless leg syndrome    Past Surgical History  Procedure Laterality Date  . Ear cyst excision  1983    Left  . Bunionectomy  2008    left  . Dilation and curettage of uterus  2004   Family History  Problem Relation Age of Onset  . Breast cancer Maternal Grandmother   . Diabetes Sister   . Heart disease Brother   . Colon cancer Neg Hx    History  Substance Use Topics  . Smoking status: Former Research scientist (life sciences)  . Smokeless tobacco: Never Used  . Alcohol Use: No   OB History    No data available     Review of Systems  10 Systems reviewed and are negative for acute change except as noted in the HPI.     Allergies  Review of patient's allergies indicates no known allergies.  Home Medications   Prior to Admission medications   Medication Sig Start Date End Date Taking? Authorizing Provider  cyclobenzaprine (FLEXERIL) 10 MG tablet Take 10 mg by mouth at bedtime. For muscle spasms   Yes Historical Provider, MD  DULoxetine (CYMBALTA) 60 MG capsule Take 60 mg by mouth 2 (two) times daily.   Yes Historical Provider, MD  gabapentin (NEURONTIN) 100 MG capsule Take 200 mg  by mouth at bedtime.   Yes Historical Provider, MD  omeprazole (PRILOSEC) 20 MG capsule Take 20 mg by mouth 2 (two) times daily before a meal.   Yes Historical Provider, MD  traMADol (ULTRAM) 50 MG tablet Take 100 mg by mouth 2 (two) times daily. For pain   Yes Historical Provider, MD  zolpidem (AMBIEN) 10 MG tablet Take 10 mg by mouth at bedtime.    Yes Historical Provider, MD  methocarbamol (ROBAXIN) 500 MG tablet Take 1 tablet (500 mg total) by mouth 2 (two) times daily as needed. 01/16/14   Larene Pickett, PA-C   BP 138/63 mmHg  Pulse 114  Temp(Src) 97.8 F (36.6 C) (Oral)  Resp 18  Ht 5\' 6"  (1.676 m)  Wt 206 lb (93.441 kg)  BMI 33.27 kg/m2  SpO2 98%  LMP 10/31/2014 Physical Exam  Constitutional: She appears well-developed and well-nourished. No distress.  HENT:  Head: Normocephalic and atraumatic.  Eyes: Pupils are equal, round, and reactive to light.  Neck: Normal range of motion. Neck supple.  Cardiovascular: Normal rate and regular rhythm.   Pulmonary/Chest: Effort normal.  Abdominal: Soft.  Musculoskeletal:       Right foot: There is swelling. There is normal range of motion, no tenderness, no bony tenderness, normal capillary refill, no crepitus, no deformity  and no laceration.       Feet:  Swelling to right malleolus but without tenderness to palpation. + great toe tenderness, swelling, mild ecchymosis  Neurological: She is alert.  Skin: Skin is warm and dry.  Nursing note and vitals reviewed.    ED Course  Procedures (including critical care time) Labs Review Labs Reviewed - No data to display  Imaging Review Dg Foot 2 Views Right  11/01/2014   CLINICAL DATA:  Left on steps, pain in distal metatarsal  EXAM: RIGHT FOOT - 2 VIEW  COMPARISON:  None.  FINDINGS: There is a tiny avulsion fracture along the lateral base of the distal phalanx of the first digit which appears chronic.  No fracture or dislocation of mid foot or forefoot. The phalanges are normal. The  calcaneus is normal. No soft tissue abnormality.  IMPRESSION: No evidence of acute fracture.  Small avulsion fracture at the base of the proximal phalanx of first digit is favored to be a remote fracture.   Electronically Signed   By: Suzy Bouchard M.D.   On: 11/01/2014 07:38     EKG Interpretation None      MDM   Final diagnoses:  Toe fracture, left, closed, initial encounter    Will be placed in post op boot and given crutches. Will be given small rx of Vicodin and antiinflammatories.  Recommend RICE. Follow-up with Ortho  43 y.o.Lavena Loretto Comer-Steverson's evaluation in the Emergency Department is complete. It has been determined that no acute conditions requiring further emergency intervention are present at this time. The patient/guardian have been advised of the diagnosis and plan. We have discussed signs and symptoms that warrant return to the ED, such as changes or worsening in symptoms.  Vital signs are stable at discharge. Filed Vitals:   11/01/14 0712  BP: 138/63  Pulse: 114  Temp: 97.8 F (36.6 C)  Resp: 18    Patient/guardian has voiced understanding and agreed to follow-up with the PCP or specialist.     Linus Mako, PA-C 11/01/14 [place], MD 11/01/14 [phone]

## 2014-11-01 NOTE — ED Notes (Signed)
Gave pt cup of ice and placed ice pack on foot. Pt states no other needs at this time, call bell in reach

## 2014-11-01 NOTE — Discharge Instructions (Signed)

## 2014-12-04 ENCOUNTER — Other Ambulatory Visit: Payer: Self-pay | Admitting: Gastroenterology

## 2015-02-22 ENCOUNTER — Encounter: Payer: Self-pay | Admitting: Gastroenterology

## 2015-04-18 ENCOUNTER — Ambulatory Visit (INDEPENDENT_AMBULATORY_CARE_PROVIDER_SITE_OTHER): Payer: PPO | Admitting: Gastroenterology

## 2015-04-18 ENCOUNTER — Other Ambulatory Visit (INDEPENDENT_AMBULATORY_CARE_PROVIDER_SITE_OTHER): Payer: PPO

## 2015-04-18 ENCOUNTER — Encounter: Payer: Self-pay | Admitting: Gastroenterology

## 2015-04-18 VITALS — BP 108/64 | HR 72 | Ht 65.25 in | Wt 223.0 lb

## 2015-04-18 DIAGNOSIS — D508 Other iron deficiency anemias: Secondary | ICD-10-CM

## 2015-04-18 DIAGNOSIS — D509 Iron deficiency anemia, unspecified: Secondary | ICD-10-CM | POA: Diagnosis not present

## 2015-04-18 DIAGNOSIS — R1314 Dysphagia, pharyngoesophageal phase: Secondary | ICD-10-CM | POA: Diagnosis not present

## 2015-04-18 LAB — CBC WITH DIFFERENTIAL/PLATELET
Basophils Absolute: 0.1 10*3/uL (ref 0.0–0.1)
Basophils Relative: 0.7 % (ref 0.0–3.0)
Eosinophils Absolute: 0.1 10*3/uL (ref 0.0–0.7)
Eosinophils Relative: 1.1 % (ref 0.0–5.0)
HCT: 29.1 % — ABNORMAL LOW (ref 36.0–46.0)
Hemoglobin: 8.9 g/dL — ABNORMAL LOW (ref 12.0–15.0)
Lymphocytes Relative: 34.5 % (ref 12.0–46.0)
Lymphs Abs: 2.4 10*3/uL (ref 0.7–4.0)
MCHC: 30.4 g/dL (ref 30.0–36.0)
MCV: 59.1 fl — ABNORMAL LOW (ref 78.0–100.0)
Monocytes Absolute: 0.5 10*3/uL (ref 0.1–1.0)
Monocytes Relative: 7.7 % (ref 3.0–12.0)
Neutro Abs: 3.9 10*3/uL (ref 1.4–7.7)
Neutrophils Relative %: 56 % (ref 43.0–77.0)
Platelets: 386 10*3/uL (ref 150.0–400.0)
RBC: 4.93 Mil/uL (ref 3.87–5.11)
RDW: 20.4 % — ABNORMAL HIGH (ref 11.5–15.5)
WBC: 6.9 10*3/uL (ref 4.0–10.5)

## 2015-04-18 NOTE — Assessment & Plan Note (Addendum)
Anemia may be due to menorrhagia.  Chronic GI blood loss is also concerned.    Recommendations #1 colonoscopy #2 stool Hemoccults  Cc Dr. Nancy Fetter

## 2015-04-18 NOTE — Progress Notes (Signed)
_                                                                                                                History of Present Illness:  [name] is a pleasant 44 year old Afro-American female referred at the request of Dr. Nancy Fetter for evaluation of anemia.  Blood work demonstrated a hemoglobin of 9 with MCV 62 in September, 2015.  B12 and folate levels were normal.  She states that she has heavy menstrual periods.  She's not aware of rectal bleeding.  She does complain of mild lower and upper abdominal pain.  She's on no gastric irritants including nonsteroidals.  She has occasional pyrosis which is well-controlled with omeprazole.  She has a history of esophageal stricture but denies dysphagia.  Bowel habits have been stable.   Past Medical History  Diagnosis Date  . ADD (attention deficit disorder with hyperactivity)   . Anxiety   . Allergic rhinitis   . GERD (gastroesophageal reflux disease)   . Insomnia   . Dysmenorrhea   . Fibromyalgia   . Restless leg syndrome   . Arthritis   . Depression   . Irritable bowel syndrome    Past Surgical History  Procedure Laterality Date  . Ear cyst excision  1983    Left  . Bunionectomy  2008    left  . Dilation and curettage of uterus  2004   family history includes Breast cancer in her maternal grandmother; Diabetes in her sister; Heart disease in her brother. There is no history of Colon cancer, Colon polyps, Esophageal cancer, Kidney disease, or Gallbladder disease. Current Outpatient Prescriptions  Medication Sig Dispense Refill  . cyclobenzaprine (FLEXERIL) 10 MG tablet Take 10 mg by mouth at bedtime. For muscle spasms    . DULoxetine (CYMBALTA) 30 MG capsule Take 30 mg by mouth 2 (two) times daily.  1  . gabapentin (NEURONTIN) 100 MG capsule Take 200 mg by mouth at bedtime.    Marland Kitchen omeprazole (PRILOSEC) 20 MG capsule TAKE ONE CAPSULE BY MOUTH TWICE A DAY 30 capsule 6  . traMADol (ULTRAM) 50 MG tablet Take 100  mg by mouth 4 (four) times daily. For pain    . Vitamin D, Ergocalciferol, (DRISDOL) 50000 UNITS CAPS capsule Take 50,000 Units by mouth. Pt takes twice a week  0  . VOLTAREN 1 % GEL Apply topically as needed.   3  . zolpidem (AMBIEN) 10 MG tablet Take 10 mg by mouth at bedtime.      No current facility-administered medications for this visit.   Allergies as of 04/18/2015  . (No Known Allergies)    reports that she has quit smoking. She has never used smokeless tobacco. She reports that she does not drink alcohol or use illicit drugs.   Review of Systems: Pertinent positive and negative review of systems were noted in the above HPI section. All other review of systems were otherwise negative.  Vital signs were reviewed in today's medical record Physical Exam: General: Well developed ,  well nourished, no acute distress Skin: anicteric Head: Normocephalic and atraumatic Eyes:  sclerae anicteric, EOMI Ears: Normal auditory acuity Mouth: No deformity or lesions Neck: Supple, no masses or thyromegaly Lymph Nodes: no lymphadenopathy Lungs: Clear throughout to auscultation Heart: Regular rate and rhythm; no murmurs, rubs or bruits Gastroinestinal: Soft, non tender and non distended. No masses, hepatosplenomegaly or hernias noted. Normal Bowel sounds Rectal:deferred Musculoskeletal: Symmetrical with no gross deformities  Skin: No lesions on visible extremities Pulses:  Normal pulses noted Extremities: No clubbing, cyanosis, edema or deformities noted Neurological: Alert oriented x 4, grossly nonfocal Cervical Nodes:  No significant cervical adenopathy Inguinal Nodes: No significant inguinal adenopathy Psychological:  Alert and cooperative. Normal mood and affect  See Assessment and Plan under Problem List

## 2015-04-18 NOTE — Assessment & Plan Note (Signed)
Asymptomatic since last dilation

## 2015-04-18 NOTE — Patient Instructions (Addendum)
You have been scheduled for a colonoscopy. Please follow written instructions given to you at your visit today.  Please pick up your prep supplies at the pharmacy within the next 1-3 days. If you use inhalers (even only as needed), please bring them with you on the day of your procedure. Your physician has requested that you go to www.[email or web address].com and enter the access code given to you at your visit today. This web site gives a general overview about your procedure. However, you should still follow specific instructions given to you by our office regarding your preparation for the procedure.  Go to the basement for labs today Suprep sample kit given

## 2015-04-19 ENCOUNTER — Ambulatory Visit (AMBULATORY_SURGERY_CENTER): Payer: PPO | Admitting: Gastroenterology

## 2015-04-19 ENCOUNTER — Encounter: Payer: Self-pay | Admitting: Gastroenterology

## 2015-04-19 VITALS — BP 131/86 | HR 89 | Temp 97.9°F | Resp 21 | Ht 65.0 in | Wt 223.0 lb

## 2015-04-19 DIAGNOSIS — D509 Iron deficiency anemia, unspecified: Secondary | ICD-10-CM | POA: Diagnosis not present

## 2015-04-19 MED ORDER — SODIUM CHLORIDE 0.9 % IV SOLN: 500.0000 mL | INTRAVENOUS | Status: DC

## 2015-04-19 NOTE — Op Note (Signed)
[place]  [address]. [place], [address]   COLONOSCOPY PROCEDURE REPORT  PATIENT: [name], [name]  MR#: [id number] BIRTHDATE: Oct 10, 1971 , 43  yrs. old GENDER: female ENDOSCOPIST: Inda Castle, MD REFERRED BY: PROCEDURE DATE:  04/19/2015 PROCEDURE:   Colonoscopy, diagnostic First Screening Colonoscopy - Avg.  risk and is 50 yrs.  old or older Yes.  Prior Negative Screening - Now for repeat screening. N/A  History of Adenoma - Now for follow-up colonoscopy & has been > or = to 3 yrs.  N/A ASA CLASS:   Class II INDICATIONS:iron deficiency anemia. MEDICATIONS: Monitored anesthesia care and Propofol 60 mg IV  DESCRIPTION OF PROCEDURE:   After the risks benefits and alternatives of the procedure were thoroughly explained, informed consent was obtained.  The digital rectal exam revealed no abnormalities of the rectum.   The LB [id number]  endoscope was introduced through the anus and advanced to the sigmoid colon. No adverse events experienced.   Limited by poor preparation.   The quality of the prep was poor.  The instrument was then slowly withdrawn as the colon was fully examined.      COLON FINDINGS: Exam was limited to the sigmoid colon and rectum. There was solid stool throughout the examined area.     The time to cecum =    Withdrawal time =      The scope was withdrawn and the procedure completed. COMPLICATIONS: There were no immediate complications.  ENDOSCOPIC IMPRESSION: limited sigmoidoscopy due to 2 retained solid stool  RECOMMENDATIONS: reprep for colonoscopy  eSigned:  Inda Castle, MD 04/19/2015 10:57 AM   cc:

## 2015-04-19 NOTE — Patient Instructions (Signed)
Discharge instructions given. Incomplete exam. Reschedule for repeat exam. Instructions given in recovery room by Earnestine Mealing RN with a double prep. Resume previous medications. YOU HAD AN ENDOSCOPIC PROCEDURE TODAY AT [facility]:   Refer to the procedure report that was given to you for any specific questions about what was found during the examination.  If the procedure report does not answer your questions, please call your gastroenterologist to clarify.  If you requested that your care partner not be given the details of your procedure findings, then the procedure report has been included in a sealed envelope for you to review at your convenience later.  YOU SHOULD EXPECT: Some feelings of bloating in the abdomen. Passage of more gas than usual.  Walking can help get rid of the air that was put into your GI tract during the procedure and reduce the bloating. If you had a lower endoscopy (such as a colonoscopy or flexible sigmoidoscopy) you may notice spotting of blood in your stool or on the toilet paper. If you underwent a bowel prep for your procedure, you may not have a normal bowel movement for a few days.  Please Note:  You might notice some irritation and congestion in your nose or some drainage.  This is from the oxygen used during your procedure.  There is no need for concern and it should clear up in a day or so.  SYMPTOMS TO REPORT IMMEDIATELY:   Following lower endoscopy (colonoscopy or flexible sigmoidoscopy):  Excessive amounts of blood in the stool  Significant tenderness or worsening of abdominal pains  Swelling of the abdomen that is new, acute  Fever of 100F or higher   Following upper endoscopy (EGD)  Vomiting of blood or coffee ground material  New chest pain or pain under the shoulder blades  Painful or persistently difficult swallowing  New shortness of breath  Fever of 100F or higher  Black, tarry-looking stools  For urgent or emergent  issues, a gastroenterologist can be reached at any hour by calling [phone].   DIET: Your first meal following the procedure should be a small meal and then it is ok to progress to your normal diet. Heavy or fried foods are harder to digest and may make you feel nauseous or bloated.  Likewise, meals heavy in dairy and vegetables can increase bloating.  Drink plenty of fluids but you should avoid alcoholic beverages for 24 hours.  ACTIVITY:  You should plan to take it easy for the rest of today and you should NOT DRIVE or use heavy machinery until tomorrow (because of the sedation medicines used during the test).    FOLLOW UP: Our staff will call the number listed on your records the next business day following your procedure to check on you and address any questions or concerns that you may have regarding the information given to you following your procedure. If we do not reach you, we will leave a message.  However, if you are feeling well and you are not experiencing any problems, there is no need to return our call.  We will assume that you have returned to your regular daily activities without incident.  If any biopsies were taken you will be contacted by phone or by letter within the next 1-3 weeks.  Please call us at [phone] if you have not heard about the biopsies in 3 weeks.    SIGNATURES/CONFIDENTIALITY: You and/or your care partner have signed paperwork which will be entered  into your electronic medical record.  These signatures attest to the fact that that the information above on your After Visit Summary has been reviewed and is understood.  Full responsibility of the confidentiality of this discharge information lies with you and/or your care-partner.

## 2015-04-19 NOTE — Progress Notes (Signed)
Exam incomplete - procedure stopped per Dr Deatra Ina  Pt stable to RR

## 2015-04-20 ENCOUNTER — Telehealth: Payer: Self-pay

## 2015-04-20 NOTE — Telephone Encounter (Signed)
No answer, left voicemail message.

## 2015-05-02 ENCOUNTER — Ambulatory Visit (AMBULATORY_SURGERY_CENTER): Payer: PPO | Admitting: Gastroenterology

## 2015-05-02 ENCOUNTER — Encounter: Payer: Self-pay | Admitting: Gastroenterology

## 2015-05-02 VITALS — BP 118/74 | HR 74 | Temp 98.0°F | Resp 18 | Ht 65.0 in | Wt 223.0 lb

## 2015-05-02 DIAGNOSIS — D508 Other iron deficiency anemias: Secondary | ICD-10-CM | POA: Diagnosis not present

## 2015-05-02 MED ORDER — SODIUM CHLORIDE 0.9 % IV SOLN: 500.0000 mL | INTRAVENOUS | Status: DC

## 2015-05-02 NOTE — Progress Notes (Signed)
Stable to RR 

## 2015-05-02 NOTE — Patient Instructions (Signed)
YOU HAD AN ENDOSCOPIC PROCEDURE TODAY AT [facility]:   Refer to the procedure report that was given to you for any specific questions about what was found during the examination.  If the procedure report does not answer your questions, please call your gastroenterologist to clarify.  If you requested that your care partner not be given the details of your procedure findings, then the procedure report has been included in a sealed envelope for you to review at your convenience later.  YOU SHOULD EXPECT: Some feelings of bloating in the abdomen. Passage of more gas than usual.  Walking can help get rid of the air that was put into your GI tract during the procedure and reduce the bloating. If you had a lower endoscopy (such as a colonoscopy or flexible sigmoidoscopy) you may notice spotting of blood in your stool or on the toilet paper. If you underwent a bowel prep for your procedure, you may not have a normal bowel movement for a few days.  Please Note:  You might notice some irritation and congestion in your nose or some drainage.  This is from the oxygen used during your procedure.  There is no need for concern and it should clear up in a day or so.  SYMPTOMS TO REPORT IMMEDIATELY:   Following lower endoscopy (colonoscopy or flexible sigmoidoscopy):  Excessive amounts of blood in the stool  Significant tenderness or worsening of abdominal pains  Swelling of the abdomen that is new, acute  Fever of 100F or higher   For urgent or emergent issues, a gastroenterologist can be reached at any hour by calling [phone].   DIET: Your first meal following the procedure should be a small meal and then it is ok to progress to your normal diet. Heavy or fried foods are harder to digest and may make you feel nauseous or bloated.  Likewise, meals heavy in dairy and vegetables can increase bloating.  Drink plenty of fluids but you should avoid alcoholic beverages for 24  hours.  ACTIVITY:  You should plan to take it easy for the rest of today and you should NOT DRIVE or use heavy machinery until tomorrow (because of the sedation medicines used during the test).    FOLLOW UP: Our staff will call the number listed on your records the next business day following your procedure to check on you and address any questions or concerns that you may have regarding the information given to you following your procedure. If we do not reach you, we will leave a message.  However, if you are feeling well and you are not experiencing any problems, there is no need to return our call.  We will assume that you have returned to your regular daily activities without incident.  If any biopsies were taken you will be contacted by phone or by letter within the next 1-3 weeks.  Please call us at [phone] if you have not heard about the biopsies in 3 weeks.    SIGNATURES/CONFIDENTIALITY: You and/or your care partner have signed paperwork which will be entered into your electronic medical record.  These signatures attest to the fact that that the information above on your After Visit Summary has been reviewed and is understood.  Full responsibility of the confidentiality of this discharge information lies with you and/or your care-partner.  Be sure to follow the directions on your hemoccult cards so you don't have a false positive.

## 2015-05-02 NOTE — Op Note (Signed)
[place]  [address]. [place], [address]   COLONOSCOPY PROCEDURE REPORT  PATIENT: [name], [name]  MR#: [id number] BIRTHDATE: 25-Nov-1971 , 43  yrs. old GENDER: female ENDOSCOPIST: Inda Castle, MD REFERRED BY: PROCEDURE DATE:  05/02/2015 PROCEDURE:   Colonoscopy, screening First Screening Colonoscopy - Avg.  risk and is 50 yrs.  old or older Yes.  Prior Negative Screening - Now for repeat screening. N/A  History of Adenoma - Now for follow-up colonoscopy & has been > or = to 3 yrs.  N/A  Polyps removed today? No ASA CLASS:   Class II INDICATIONS:iron deficiency anemia. MEDICATIONS: Monitored anesthesia care, Propofol 300 mg IV, and Lidocaine 40 mg IV  DESCRIPTION OF PROCEDURE:   After the risks benefits and alternatives of the procedure were thoroughly explained, informed consent was obtained.  The digital rectal exam revealed no abnormalities of the rectum.   The LB [id number]-H190 [id number]  endoscope was introduced through the anus and advanced to the cecum, which was identified by both the appendix and ileocecal valve. No adverse events experienced.   The quality of the prep was (Suprep was used) adequate  The instrument was then slowly withdrawn as the colon was fully examined.      COLON FINDINGS: A normal appearing cecum, ileocecal valve, and appendiceal orifice were identified.  The ascending, transverse, descending, sigmoid colon, and rectum appeared unremarkable. Retroflexed views revealed no abnormalities. The time to cecum = -5.8 Withdrawal time = 5.8   The scope was withdrawn and the procedure completed. COMPLICATIONS: There were no immediate complications.  ENDOSCOPIC IMPRESSION: Normal colonoscopy  RECOMMENDATIONS: Followup hemeoccults  7-10 days  eSigned:  Inda Castle, MD 05/02/2015 2:30 PM   cc: Theodis Aguas, MD

## 2015-05-03 ENCOUNTER — Telehealth: Payer: Self-pay | Admitting: *Deleted

## 2015-05-03 ENCOUNTER — Other Ambulatory Visit: Payer: Self-pay

## 2015-05-03 DIAGNOSIS — D649 Anemia, unspecified: Secondary | ICD-10-CM

## 2015-05-03 NOTE — Telephone Encounter (Signed)
  Follow up Call-  Call back number 05/02/2015 04/19/2015  Post procedure Call Back phone  # [phone]  Permission to leave phone message Yes Yes     Patient questions:  Do you have a fever, pain , or abdominal swelling? No. Pain Score  0 *  Have you tolerated food without any problems? Yes.    Have you been able to return to your normal activities? Yes.    Do you have any questions about your discharge instructions: Diet   No. Medications  No. Follow up visit  No.  Do you have questions or concerns about your Care? No.  Actions: * If pain score is 4 or above: No action needed, pain <4.

## 2015-05-21 ENCOUNTER — Other Ambulatory Visit: Payer: Self-pay | Admitting: Gastroenterology

## 2016-01-11 DIAGNOSIS — Z79899 Other long term (current) drug therapy: Secondary | ICD-10-CM | POA: Diagnosis not present

## 2016-01-11 DIAGNOSIS — M797 Fibromyalgia: Secondary | ICD-10-CM | POA: Diagnosis not present

## 2016-01-11 DIAGNOSIS — M1711 Unilateral primary osteoarthritis, right knee: Secondary | ICD-10-CM | POA: Diagnosis not present

## 2016-01-11 DIAGNOSIS — F5102 Adjustment insomnia: Secondary | ICD-10-CM | POA: Diagnosis not present

## 2016-01-11 DIAGNOSIS — E559 Vitamin D deficiency, unspecified: Secondary | ICD-10-CM | POA: Diagnosis not present

## 2016-01-11 DIAGNOSIS — M2550 Pain in unspecified joint: Secondary | ICD-10-CM | POA: Diagnosis not present

## 2016-01-11 DIAGNOSIS — R5383 Other fatigue: Secondary | ICD-10-CM | POA: Diagnosis not present

## 2016-02-10 DIAGNOSIS — Z1231 Encounter for screening mammogram for malignant neoplasm of breast: Secondary | ICD-10-CM | POA: Diagnosis not present

## 2016-02-10 DIAGNOSIS — Z803 Family history of malignant neoplasm of breast: Secondary | ICD-10-CM | POA: Diagnosis not present

## 2016-06-11 DIAGNOSIS — M797 Fibromyalgia: Secondary | ICD-10-CM | POA: Diagnosis not present

## 2016-06-11 DIAGNOSIS — G4709 Other insomnia: Secondary | ICD-10-CM | POA: Diagnosis not present

## 2016-06-11 DIAGNOSIS — R5381 Other malaise: Secondary | ICD-10-CM | POA: Diagnosis not present

## 2016-06-11 DIAGNOSIS — M174 Other bilateral secondary osteoarthritis of knee: Secondary | ICD-10-CM | POA: Diagnosis not present

## 2016-06-13 DIAGNOSIS — Z1389 Encounter for screening for other disorder: Secondary | ICD-10-CM | POA: Diagnosis not present

## 2016-06-13 DIAGNOSIS — D509 Iron deficiency anemia, unspecified: Secondary | ICD-10-CM | POA: Diagnosis not present

## 2016-06-13 DIAGNOSIS — J3489 Other specified disorders of nose and nasal sinuses: Secondary | ICD-10-CM | POA: Diagnosis not present

## 2016-06-13 DIAGNOSIS — M797 Fibromyalgia: Secondary | ICD-10-CM | POA: Diagnosis not present

## 2016-07-13 DIAGNOSIS — M1712 Unilateral primary osteoarthritis, left knee: Secondary | ICD-10-CM | POA: Diagnosis not present

## 2016-07-13 DIAGNOSIS — M1711 Unilateral primary osteoarthritis, right knee: Secondary | ICD-10-CM | POA: Diagnosis not present

## 2016-07-20 DIAGNOSIS — M1712 Unilateral primary osteoarthritis, left knee: Secondary | ICD-10-CM | POA: Diagnosis not present

## 2016-07-20 DIAGNOSIS — M1711 Unilateral primary osteoarthritis, right knee: Secondary | ICD-10-CM | POA: Diagnosis not present

## 2016-07-20 DIAGNOSIS — Z79899 Other long term (current) drug therapy: Secondary | ICD-10-CM | POA: Diagnosis not present

## 2016-07-20 DIAGNOSIS — M2550 Pain in unspecified joint: Secondary | ICD-10-CM | POA: Diagnosis not present

## 2016-07-21 DIAGNOSIS — Z5181 Encounter for therapeutic drug level monitoring: Secondary | ICD-10-CM | POA: Diagnosis not present

## 2016-08-03 DIAGNOSIS — M1711 Unilateral primary osteoarthritis, right knee: Secondary | ICD-10-CM | POA: Diagnosis not present

## 2016-08-03 DIAGNOSIS — M1712 Unilateral primary osteoarthritis, left knee: Secondary | ICD-10-CM | POA: Diagnosis not present

## 2016-08-06 DIAGNOSIS — M1711 Unilateral primary osteoarthritis, right knee: Secondary | ICD-10-CM | POA: Diagnosis not present

## 2016-08-07 ENCOUNTER — Other Ambulatory Visit: Payer: Self-pay | Admitting: Orthopaedic Surgery

## 2016-08-07 DIAGNOSIS — M25561 Pain in right knee: Secondary | ICD-10-CM

## 2016-08-10 DIAGNOSIS — M1712 Unilateral primary osteoarthritis, left knee: Secondary | ICD-10-CM | POA: Diagnosis not present

## 2016-08-18 ENCOUNTER — Ambulatory Visit
Admission: RE | Admit: 2016-08-18 | Discharge: 2016-08-18 | Disposition: A | Discharge status: Home / Self Care | Payer: PPO | Source: Ambulatory Visit | Attending: Orthopaedic Surgery | Admitting: Orthopaedic Surgery

## 2016-08-18 DIAGNOSIS — M25561 Pain in right knee: Secondary | ICD-10-CM

## 2016-08-21 DIAGNOSIS — M1712 Unilateral primary osteoarthritis, left knee: Secondary | ICD-10-CM | POA: Diagnosis not present

## 2016-08-23 DIAGNOSIS — M1712 Unilateral primary osteoarthritis, left knee: Secondary | ICD-10-CM | POA: Diagnosis not present

## 2016-08-23 DIAGNOSIS — M1611 Unilateral primary osteoarthritis, right hip: Secondary | ICD-10-CM | POA: Diagnosis not present

## 2016-08-29 DIAGNOSIS — M1711 Unilateral primary osteoarthritis, right knee: Secondary | ICD-10-CM | POA: Diagnosis not present

## 2016-09-06 DIAGNOSIS — M25552 Pain in left hip: Secondary | ICD-10-CM | POA: Diagnosis not present

## 2016-10-14 ENCOUNTER — Telehealth: Payer: Self-pay | Admitting: Radiology

## 2016-10-14 NOTE — Telephone Encounter (Signed)
Received refill request for Tramadol

## 2016-10-14 NOTE — Telephone Encounter (Signed)
Patient needs follow up appt with Dr Estanislado Pandy pls call to make appt. Jan/ Feb please

## 2016-10-14 NOTE — Telephone Encounter (Signed)
last visit 08/29/16 next visit not scheduled, sent message for appt scheduling UDS 07/30/16 narcotic agreement 07/20/16 ok to refill Tramadol ?

## 2016-10-15 NOTE — Telephone Encounter (Signed)
ok to refill Tramadol

## 2016-10-15 NOTE — Telephone Encounter (Signed)
LMOM for patient to call back and schedule January follow up.

## 2016-10-16 ENCOUNTER — Telehealth: Payer: Self-pay | Admitting: Radiology

## 2016-10-16 MED ORDER — TRAMADOL HCL 50 MG PO TABS: 100.0000 mg | ORAL_TABLET | Freq: Two times a day (BID) | ORAL | 3 refills | Status: DC

## 2016-10-16 MED ORDER — VOLTAREN 1 % TD GEL: TRANSDERMAL | 3 refills | Status: DC

## 2016-10-16 NOTE — Telephone Encounter (Signed)
Refill request received via fax for Voltaren gel  Ok to refill per Dr Estanislado Pandy

## 2016-10-17 ENCOUNTER — Telehealth: Payer: Self-pay | Admitting: Radiology

## 2016-10-17 NOTE — Telephone Encounter (Signed)
request received via fax for PA v gel

## 2016-10-22 NOTE — Telephone Encounter (Signed)
Have tried to do this on cover my meds / can not pull her up

## 2016-10-23 NOTE — Telephone Encounter (Signed)
Have been able to pull up and complete. Approved

## 2016-11-13 ENCOUNTER — Other Ambulatory Visit: Payer: Self-pay | Admitting: Radiology

## 2016-11-13 NOTE — Telephone Encounter (Signed)
Refill request received via fax for   Flexeril

## 2016-11-14 MED ORDER — CYCLOBENZAPRINE HCL 10 MG PO TABS: 10.0000 mg | ORAL_TABLET | Freq: Every day | ORAL | 5 refills | Status: DC

## 2016-11-14 NOTE — Telephone Encounter (Signed)
09/06/16 last visit  12/26/16 next visit  Ok to refill per Dr Estanislado Pandy

## 2016-11-23 ENCOUNTER — Other Ambulatory Visit: Payer: Self-pay | Admitting: *Deleted

## 2016-11-23 MED ORDER — ZOLPIDEM TARTRATE 10 MG PO TABS: 10.0000 mg | ORAL_TABLET | Freq: Every evening | ORAL | 5 refills | Status: DC | PRN

## 2016-11-23 NOTE — Telephone Encounter (Signed)
Last Visit: 06/11/16 Next Visit: 12/26/16  Okay to refill Ambien ?

## 2016-12-18 DIAGNOSIS — J019 Acute sinusitis, unspecified: Secondary | ICD-10-CM | POA: Diagnosis not present

## 2016-12-25 DIAGNOSIS — M7062 Trochanteric bursitis, left hip: Secondary | ICD-10-CM

## 2016-12-25 DIAGNOSIS — Z862 Personal history of diseases of the blood and blood-forming organs and certain disorders involving the immune mechanism: Secondary | ICD-10-CM | POA: Insufficient documentation

## 2016-12-25 DIAGNOSIS — R768 Other specified abnormal immunological findings in serum: Secondary | ICD-10-CM | POA: Insufficient documentation

## 2016-12-25 DIAGNOSIS — F5101 Primary insomnia: Secondary | ICD-10-CM | POA: Insufficient documentation

## 2016-12-25 DIAGNOSIS — R5383 Other fatigue: Secondary | ICD-10-CM | POA: Insufficient documentation

## 2016-12-25 DIAGNOSIS — M7061 Trochanteric bursitis, right hip: Secondary | ICD-10-CM | POA: Insufficient documentation

## 2016-12-25 DIAGNOSIS — M170 Bilateral primary osteoarthritis of knee: Secondary | ICD-10-CM | POA: Insufficient documentation

## 2016-12-25 DIAGNOSIS — M797 Fibromyalgia: Secondary | ICD-10-CM | POA: Insufficient documentation

## 2016-12-25 DIAGNOSIS — Z8719 Personal history of other diseases of the digestive system: Secondary | ICD-10-CM | POA: Insufficient documentation

## 2016-12-25 NOTE — Progress Notes (Signed)
Office Visit Note  Patient: [name]             Date of Birth: 1971/04/07           MRN: [id number]             PCP: Lynne Logan, MD Referring: Donald Prose, MD Visit Date: 12/26/2016 Occupation: Disability, Financial risk analyst    Subjective:  Pain in right knee   History of Present Illness: [name] is a 46 y.o. female with history of fibromyalgia and osteoarthritis. She states her right knee joint has been having more discomfort since she has popped her patella recently. She had some swelling in her knee joint which is resolved now. She is having pain in bilateral trapezius area, bilateral shoulders lateral knee joints . She also has some discomfort in her bilateral feet. She reports the pain from fibromyalgia scale of 0-10 about 7 and fatigue but 7 as well.  Activities of Daily Living:  Patient reports morning stiffness for 1 hour.   Patient Reports nocturnal pain.  Difficulty dressing/grooming: Reports Difficulty climbing stairs: Reports Difficulty getting out of chair: Reports Difficulty using hands for taps, buttons, cutlery, and/or writing: Reports   Review of Systems  Constitutional: Positive for fatigue. Negative for night sweats, weight gain, weight loss and weakness.  HENT: Positive for mouth dryness. Negative for mouth sores, trouble swallowing, trouble swallowing and nose dryness.   Eyes: Negative for pain, redness, visual disturbance and dryness.  Respiratory: Negative for cough, shortness of breath and difficulty breathing.   Cardiovascular: Positive for hypertension. Negative for chest pain, palpitations, irregular heartbeat and swelling in legs/feet.  Gastrointestinal: Negative for blood in stool, constipation and diarrhea.  Endocrine: Negative for increased urination.  Genitourinary: Negative for vaginal dryness.  Musculoskeletal: Positive for arthralgias, joint pain, myalgias, morning stiffness and myalgias. Negative for joint  swelling, muscle weakness and muscle tenderness.  Skin: Negative for color change, rash, hair loss, skin tightness, ulcers and sensitivity to sunlight.  Allergic/Immunologic: Negative for susceptible to infections.  Neurological: Negative for dizziness, memory loss and night sweats.  Hematological: Negative for swollen glands.  Psychiatric/Behavioral: Positive for sleep disturbance. Negative for depressed mood. The patient is not nervous/anxious.     PMFS History:  Patient Active Problem List   Diagnosis Date Noted  . Fibromyalgia 12/25/2016  . Other fatigue 12/25/2016  . Primary insomnia 12/25/2016  . Primary osteoarthritis of both knees 12/25/2016  . ANA positive 12/25/2016  . History of gastroesophageal reflux (GERD) 12/25/2016  . History of anemia 12/25/2016  . Trochanteric bursitis of both hips 12/25/2016  . Anemia, iron deficiency 04/18/2015  . Dysphagia, pharyngoesophageal phase 11/14/2011  . Esophageal reflux 11/14/2011    Past Medical History:  Diagnosis Date  . ADD (attention deficit disorder with hyperactivity)   . Allergic rhinitis   . Anxiety   . Arthritis   . Depression   . Dysmenorrhea   . Fibromyalgia   . GERD (gastroesophageal reflux disease)   . Insomnia   . Irritable bowel syndrome   . Restless leg syndrome     Family History  Problem Relation Age of Onset  . Breast cancer Maternal Grandmother   . Diabetes Sister   . Heart disease Brother   . Colon cancer Neg Hx   . Colon polyps Neg Hx   . Esophageal cancer Neg Hx   . Kidney disease Neg Hx   . Gallbladder disease Neg Hx    Past Surgical History:  Procedure Laterality  Date  . BUNIONECTOMY  2008   left  . DILATION AND CURETTAGE OF UTERUS  2004  . EAR CYST EXCISION  1983   Left   Social History   Social History Narrative  . No narrative on file     Objective: Vital Signs: BP (!) 146/98   Pulse (!) 119   Resp 18   Ht 5\' 5"  (1.651 m)   Wt 232 lb (105.2 kg)   BMI 38.61 kg/m     Physical Exam  Constitutional: She is oriented to person, place, and time. She appears well-developed and well-nourished.  HENT:  Head: Normocephalic and atraumatic.  Eyes: Conjunctivae and EOM are normal.  Neck: Normal range of motion.  Cardiovascular: Normal rate, regular rhythm, normal heart sounds and intact distal pulses.   Pulmonary/Chest: Effort normal and breath sounds normal.  Abdominal: Soft. Bowel sounds are normal.  Lymphadenopathy:    She has no cervical adenopathy.  Neurological: She is alert and oriented to person, place, and time.  Skin: Skin is warm and dry. Capillary refill takes less than 2 seconds.  Psychiatric: She has a normal mood and affect. Her behavior is normal.  Nursing note and vitals reviewed.    Musculoskeletal Exam: He spine, thoracic spine good range of motion discomfort and lumbar region. Shoulder joints, elbow joints, wrist joints, MCPs PIPs DIPs with good range of motion with no synovitis. Hip joints, knee joints, ankles MTPs PIPs DIPs with good range of motion with no synovitis. She had painful range of motion of her right knee joint. Fibromyalgia tender points out of 18 positive.  CDAI Exam: No CDAI exam completed.    Investigation: Findings:  Labs from July 02, 2011 show CMP with GFR, CBC with diff, and all are normal.  C3-4 are normal.  ANA is positive with a titer of 1:40 with a speckled pattern, vitamin D 25-OH is decreased at 19.   X-rays From July 09, 2011 of hands show PIP/DIP joint space narrowing bilaterally which is consistent with OA.   07/21/2016 UDS , narcotic agreement on file 07/2016/    Imaging: No results found.  Speciality Comments: No specialty comments available.    Procedures:  No procedures performed Allergies: Patient has no known allergies.   Assessment / Plan:     Visit Diagnoses: Fibromyalgia: She's been having generalized pain and discomfort and positive tender points. She's been using tramadol which  controls her pain symptoms. Indications side effects contraindications were again reviewed.  Other fatigue: Increased fatigue due to chronic insomnia  Primary insomnia good sleep hygiene was discussed.  Primary osteoarthritis of both knees she's been having increased pain in bilateral knee joints. She has right patellar instability I've advised her to make a follow-up appointment with Dr. Durward Fortes. I will refill her Voltaren gel prescription today. Weight loss diet and exercise was also discussed at length today  Trochanteric bursitis of both hips: Is better  Her other medical problems are as follows  History of gastroesophageal reflux (GERD)  History of anemia  Vitamin D deficiency  Gastroesophageal reflux disease without esophagitis  Iron deficiency anemia, unspecified iron deficiency anemia type  ANA positive - 1:80 centromere in January 2017, no clinical features of autoimmune disease   Other medical problems include history of depression and attention deficit disorder Orders: No orders of the defined types were placed in this encounter.  Meds ordered this encounter  Medications  . VOLTAREN 1 % GEL    Sig: 3 grams tid  Dispense:  3 Tube    Refill:  3    Face-to-face time spent with patient was 30 minutes. 50% of time was spent in counseling and coordination of care.  Follow-Up Instructions: Return in about 6 months (around 06/25/2017) for Fibromyalgia, osteoarthritis.   Bo Merino, MD  Note - This record has been created using Editor, commissioning.  Chart creation errors have been sought, but may not always  have been located. Such creation errors do not reflect on  the standard of medical care.

## 2016-12-26 ENCOUNTER — Ambulatory Visit (INDEPENDENT_AMBULATORY_CARE_PROVIDER_SITE_OTHER): Payer: PPO | Admitting: Rheumatology

## 2016-12-26 ENCOUNTER — Encounter: Payer: Self-pay | Admitting: Rheumatology

## 2016-12-26 VITALS — BP 146/98 | HR 119 | Resp 18 | Ht 65.0 in | Wt 232.0 lb

## 2016-12-26 DIAGNOSIS — R5383 Other fatigue: Secondary | ICD-10-CM

## 2016-12-26 DIAGNOSIS — Z862 Personal history of diseases of the blood and blood-forming organs and certain disorders involving the immune mechanism: Secondary | ICD-10-CM | POA: Diagnosis not present

## 2016-12-26 DIAGNOSIS — M797 Fibromyalgia: Secondary | ICD-10-CM

## 2016-12-26 DIAGNOSIS — E559 Vitamin D deficiency, unspecified: Secondary | ICD-10-CM | POA: Diagnosis not present

## 2016-12-26 DIAGNOSIS — R768 Other specified abnormal immunological findings in serum: Secondary | ICD-10-CM | POA: Diagnosis not present

## 2016-12-26 DIAGNOSIS — M7062 Trochanteric bursitis, left hip: Secondary | ICD-10-CM | POA: Diagnosis not present

## 2016-12-26 DIAGNOSIS — F5101 Primary insomnia: Secondary | ICD-10-CM | POA: Diagnosis not present

## 2016-12-26 DIAGNOSIS — M7061 Trochanteric bursitis, right hip: Secondary | ICD-10-CM

## 2016-12-26 DIAGNOSIS — Z8719 Personal history of other diseases of the digestive system: Secondary | ICD-10-CM | POA: Diagnosis not present

## 2016-12-26 DIAGNOSIS — D509 Iron deficiency anemia, unspecified: Secondary | ICD-10-CM | POA: Diagnosis not present

## 2016-12-26 DIAGNOSIS — K219 Gastro-esophageal reflux disease without esophagitis: Secondary | ICD-10-CM

## 2016-12-26 DIAGNOSIS — M170 Bilateral primary osteoarthritis of knee: Secondary | ICD-10-CM | POA: Diagnosis not present

## 2016-12-26 MED ORDER — VOLTAREN 1 % TD GEL: TRANSDERMAL | 3 refills | Status: DC

## 2016-12-26 NOTE — Patient Instructions (Signed)
Supplements for OA Natural anti-inflammatories  You can purchase these at [employer], [employer] or online.  . Turmeric (capsules)  . Ginger (ginger root or capsules)  . Omega 3 (Fish, flax seeds, chia seeds, walnuts, almonds)  . Tart cherry (dried or extract)   Patient should be under the care of a physician while taking these supplements. This may not be reproduced without the permission of Dr. Deja Pisarski.  

## 2016-12-27 ENCOUNTER — Ambulatory Visit (INDEPENDENT_AMBULATORY_CARE_PROVIDER_SITE_OTHER): Payer: PPO

## 2016-12-27 ENCOUNTER — Encounter (INDEPENDENT_AMBULATORY_CARE_PROVIDER_SITE_OTHER): Payer: Self-pay | Admitting: Orthopaedic Surgery

## 2016-12-27 ENCOUNTER — Ambulatory Visit (INDEPENDENT_AMBULATORY_CARE_PROVIDER_SITE_OTHER): Payer: PPO | Admitting: Orthopaedic Surgery

## 2016-12-27 VITALS — BP 146/95 | HR 117 | Resp 14 | Ht 65.0 in | Wt 232.0 lb

## 2016-12-27 DIAGNOSIS — M25561 Pain in right knee: Secondary | ICD-10-CM

## 2016-12-27 DIAGNOSIS — G8929 Other chronic pain: Secondary | ICD-10-CM

## 2016-12-27 NOTE — Progress Notes (Signed)
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 Long discussion re above including surgery, procedure, period ofmobilization, physical therapy, and possible limitations of procedure  Follow-Up Instructions: No Follow-up on file.   Orders:  No orders of the defined types were placed in this encounter.  No orders of the defined types were placed in this encounter.     Procedures: No procedures performed   Clinical Data: No additional findings.   Subjective: Chief Complaint  Patient presents with  . Right Knee - Pain    Right knee pain x 6 months, popping, clicking, grinding noises x 2 years, swelling, difficulty walking, difficulty w/stairs, difficulty sleeping, no injury, Tramadol - helps some, ready to discuss total knee replacement  recurrent episodes of right patella "coming out of place".Patient has been able to reduce the patella on each occasion. She feels as though this problem has been recurrent over period of several years. Dr. Estanislado Pandy has completed a course of [place] supplementation. She had an MRI scan of her right knee in September demonstrating mild degenerative changes. Chondromalacia was identified along the lateral patella facet and lateral femoral condyle articulation along with mild degenerative changes in both medial and lateral compartment.  Review of Systems   Objective: Vital Signs: BP (!) 146/95 (BP Location: Right Arm, Patient Position: Sitting, Cuff Size: Normal)   Pulse (!) 117   Resp 14   Ht 5\' 5"  (1.651 m)   Wt 232 lb (105.2 kg)   LMP 12/18/2016   BMI  38.61 kg/m   Physical Exam  Ortho Exam right knee exam with apprehension on lateral patellar motion. Positive patellar crepitation. Mild pain along the lateral patella facet. No pain medially. No medial lateral joint pain. Full extension and flexion to over 100 without instability. No calf discomfort or popliteal mass. Neurovascular exam intact distally. Straight leg raise negative bilaterally . No specialty comments available.  Imaging: No results found.  PMFS History: Patient Active Problem List   Diagnosis Date Noted  . Fibromyalgia 12/25/2016  . Other fatigue 12/25/2016  . Primary insomnia 12/25/2016  . Primary osteoarthritis of both knees 12/25/2016  . ANA positive 12/25/2016  . History of gastroesophageal reflux (GERD) 12/25/2016  . History of anemia 12/25/2016  . Trochanteric bursitis of both hips 12/25/2016  . Anemia, iron deficiency 04/18/2015  . Dysphagia, pharyngoesophageal phase 11/14/2011  . Esophageal reflux 11/14/2011   Past Medical History:  Diagnosis Date  . ADD (attention deficit disorder with hyperactivity)   . Allergic rhinitis   . Anxiety   . Arthritis   . Depression   . Dysmenorrhea   . Fibromyalgia   . GERD (gastroesophageal reflux disease)   . Insomnia   . Irritable bowel syndrome   . Restless leg syndrome     Family History  Problem Relation Age of Onset  . Breast cancer Maternal Grandmother   . Diabetes Sister   . Heart disease Brother   . Colon cancer Neg Hx   . Colon polyps Neg  Hx   . Esophageal cancer Neg Hx   . Kidney disease Neg Hx   . Gallbladder disease Neg Hx     Past Surgical History:  Procedure Laterality Date  . BUNIONECTOMY  2008   left  . DILATION AND CURETTAGE OF UTERUS  2004  . EAR CYST EXCISION  1983   Left   Social History   Occupational History  . Disabled    Social History Main Topics  . Smoking status: Former Smoker    Packs/day: 0.10    Years: 12.00    Types: Cigarettes    Quit date: 12/27/1993  .  Smokeless tobacco: Never Used  . Alcohol use No  . Drug use: No  . Sexual activity: Not on file

## 2017-01-07 ENCOUNTER — Inpatient Hospital Stay (INDEPENDENT_AMBULATORY_CARE_PROVIDER_SITE_OTHER): Payer: PPO | Admitting: Orthopaedic Surgery

## 2017-01-09 ENCOUNTER — Inpatient Hospital Stay (INDEPENDENT_AMBULATORY_CARE_PROVIDER_SITE_OTHER): Payer: PPO | Admitting: Orthopedic Surgery

## 2017-01-10 DIAGNOSIS — M2241 Chondromalacia patellae, right knee: Secondary | ICD-10-CM | POA: Diagnosis not present

## 2017-01-10 DIAGNOSIS — M223X1 Other derangements of patella, right knee: Secondary | ICD-10-CM | POA: Diagnosis not present

## 2017-01-10 DIAGNOSIS — M94261 Chondromalacia, right knee: Secondary | ICD-10-CM | POA: Diagnosis not present

## 2017-01-10 DIAGNOSIS — G8918 Other acute postprocedural pain: Secondary | ICD-10-CM | POA: Diagnosis not present

## 2017-01-10 DIAGNOSIS — M2211 Recurrent subluxation of patella, right knee: Secondary | ICD-10-CM | POA: Diagnosis not present

## 2017-01-14 ENCOUNTER — Encounter (INDEPENDENT_AMBULATORY_CARE_PROVIDER_SITE_OTHER): Payer: Self-pay | Admitting: Orthopaedic Surgery

## 2017-01-14 ENCOUNTER — Ambulatory Visit (INDEPENDENT_AMBULATORY_CARE_PROVIDER_SITE_OTHER): Payer: PPO | Admitting: Orthopaedic Surgery

## 2017-01-14 VITALS — BP 124/95 | HR 123 | Resp 14 | Ht 65.0 in | Wt 232.0 lb

## 2017-01-14 DIAGNOSIS — M25561 Pain in right knee: Secondary | ICD-10-CM

## 2017-01-14 DIAGNOSIS — G8929 Other chronic pain: Secondary | ICD-10-CM

## 2017-01-14 NOTE — Progress Notes (Signed)
Office Visit Note   Patient: [name]           Date of Birth: [birth year]           MRN: [id number] Visit Date: 01/14/2017              Requested by: Donald Prose, MD [place], [place] [address] PCP: Lynne Logan, MD   Assessment & Plan: Visit Diagnoses: 4 days status post right knee arthroscopy with lateral release and plication of the medial patellofemoral ligament. Noted to have significant cartilage pathology in the mid weightbearing surface of the medial femoral condyle. Doing well without any problems.  Plan: Dressing change right knee, continue with knee immobilizer and weightbearing as tolerated in the knee immobilizer and crutches. We'll see in 1 week and start therapy.  Follow-Up Instructions: No Follow-up on file.   Orders:  No orders of the defined types were placed in this encounter.  No orders of the defined types were placed in this encounter.     Procedures: No procedures performed   Clinical Data: No additional findings.   Subjective: No chief complaint on file.   Right knee 4 days status of Right knee arthroscopic lateral release right patella   Ambulating with crutches. Pt states she is having trouble getting comfortable and takesOxy  at night and tramadol daytime  Denies shortness of breath or chest pain. No fever or chills. Calf discomfort. No numbness or tingling.  Pathology identified at the time of surgery included lateral patellar tilt with chondromalacia of the lateral patella. Lateral release was performed. I also performed an open medial patellofemoral ligament plication. There were areas of grade 3 chondromalacia in the mid weightbearing surface of the femoral condyle this was debridement.  Review of Systems   Objective: Vital Signs: LMP 12/18/2016   Physical Exam  Ortho Exam right knee dressing was removed. Wounds looked just fine. Minimal effusion. No calf pain. No distal edema. Neurovascular  exam intact.  Specialty Comments:  No specialty comments available.  Imaging: No results found.   PMFS History: Patient Active Problem List   Diagnosis Date Noted  . Fibromyalgia 12/25/2016  . Other fatigue 12/25/2016  . Primary insomnia 12/25/2016  . Primary osteoarthritis of both knees 12/25/2016  . ANA positive 12/25/2016  . History of gastroesophageal reflux (GERD) 12/25/2016  . History of anemia 12/25/2016  . Trochanteric bursitis of both hips 12/25/2016  . Anemia, iron deficiency 04/18/2015  . Dysphagia, pharyngoesophageal phase 11/14/2011  . Esophageal reflux 11/14/2011   Past Medical History:  Diagnosis Date  . ADD (attention deficit disorder with hyperactivity)   . Allergic rhinitis   . Anxiety   . Arthritis   . Depression   . Dysmenorrhea   . Fibromyalgia   . GERD (gastroesophageal reflux disease)   . Insomnia   . Irritable bowel syndrome   . Restless leg syndrome     Family History  Problem Relation Age of Onset  . Breast cancer Maternal Grandmother   . Diabetes Sister   . Heart disease Brother   . Colon cancer Neg Hx   . Colon polyps Neg Hx   . Esophageal cancer Neg Hx   . Kidney disease Neg Hx   . Gallbladder disease Neg Hx     Past Surgical History:  Procedure Laterality Date  . BUNIONECTOMY  2008   left  . DILATION AND CURETTAGE OF UTERUS  2004  . [place]  Left   Social History   Occupational History  . Disabled    Social History Main Topics  . Smoking status: Former Smoker    Packs/day: 0.10    Years: 12.00    Types: Cigarettes    Quit date: 12/27/1993  . Smokeless tobacco: Never Used  . Alcohol use No  . Drug use: No  . Sexual activity: Not on file

## 2017-01-18 ENCOUNTER — Encounter (INDEPENDENT_AMBULATORY_CARE_PROVIDER_SITE_OTHER): Payer: Self-pay | Admitting: Orthopaedic Surgery

## 2017-01-18 NOTE — Progress Notes (Deleted)
   Office Visit Note   Patient: [name]           Date of Birth: June 04, 1971           MRN: [id number] Visit Date: 01/21/2017              Requested by: Donald Prose, MD [place], [place] [address] PCP: Lynne Logan, MD   Assessment & Plan: Visit Diagnoses: No diagnosis found.  Plan: ***  Follow-Up Instructions: No Follow-up on file.   Orders:  No orders of the defined types were placed in this encounter.  No orders of the defined types were placed in this encounter.     Procedures: No procedures performed   Clinical Data: No additional findings.   Subjective: No chief complaint on file.   2 week, 4 day status post right patella subluxation with mild tri compartment DJD      Review of Systems   Objective: Vital Signs: There were no vitals taken for this visit.  Physical Exam  Ortho Exam  Specialty Comments:  No specialty comments available.  Imaging: No results found.   PMFS History: Patient Active Problem List   Diagnosis Date Noted  . Fibromyalgia 12/25/2016  . Other fatigue 12/25/2016  . Primary insomnia 12/25/2016  . Primary osteoarthritis of both knees 12/25/2016  . ANA positive 12/25/2016  . History of gastroesophageal reflux (GERD) 12/25/2016  . History of anemia 12/25/2016  . Trochanteric bursitis of both hips 12/25/2016  . Anemia, iron deficiency 04/18/2015  . Dysphagia, pharyngoesophageal phase 11/14/2011  . Esophageal reflux 11/14/2011   Past Medical History:  Diagnosis Date  . ADD (attention deficit disorder with hyperactivity)   . Allergic rhinitis   . Anxiety   . Arthritis   . Depression   . Dysmenorrhea   . Fibromyalgia   . GERD (gastroesophageal reflux disease)   . Insomnia   . Irritable bowel syndrome   . Restless leg syndrome     Family History  Problem Relation Age of Onset  . Breast cancer Maternal Grandmother   . Diabetes Sister   . Heart disease Brother   . Colon  cancer Neg Hx   . Colon polyps Neg Hx   . Esophageal cancer Neg Hx   . Kidney disease Neg Hx   . Gallbladder disease Neg Hx     Past Surgical History:  Procedure Laterality Date  . BUNIONECTOMY  2008   left  . DILATION AND CURETTAGE OF UTERUS  2004  . EAR CYST EXCISION  1983   Left   Social History   Occupational History  . Disabled    Social History Main Topics  . Smoking status: Former Smoker    Packs/day: 0.10    Years: 12.00    Types: Cigarettes    Quit date: 12/27/1993  . Smokeless tobacco: Never Used  . Alcohol use No  . Drug use: No  . Sexual activity: Not on file

## 2017-01-21 ENCOUNTER — Ambulatory Visit (INDEPENDENT_AMBULATORY_CARE_PROVIDER_SITE_OTHER): Payer: PPO | Admitting: Orthopaedic Surgery

## 2017-01-22 ENCOUNTER — Ambulatory Visit (INDEPENDENT_AMBULATORY_CARE_PROVIDER_SITE_OTHER): Payer: PPO | Admitting: Orthopaedic Surgery

## 2017-01-25 ENCOUNTER — Encounter (INDEPENDENT_AMBULATORY_CARE_PROVIDER_SITE_OTHER): Payer: Self-pay | Admitting: Orthopaedic Surgery

## 2017-01-25 ENCOUNTER — Ambulatory Visit (INDEPENDENT_AMBULATORY_CARE_PROVIDER_SITE_OTHER): Payer: PPO | Admitting: Orthopaedic Surgery

## 2017-01-25 VITALS — BP 148/98 | HR 110 | Ht 65.0 in | Wt 232.0 lb

## 2017-01-25 DIAGNOSIS — M25561 Pain in right knee: Secondary | ICD-10-CM

## 2017-01-25 DIAGNOSIS — G8929 Other chronic pain: Secondary | ICD-10-CM

## 2017-01-25 NOTE — Progress Notes (Signed)
Office Visit Note   Patient: [name]           Date of Birth: 08/01/1971           MRN: [id number] Visit Date: 01/25/2017              Requested by: Donald Prose, MD [place] [address] [place], [place] [address] PCP: Lynne Logan, MD   Assessment & Plan: Visit Diagnoses: 2 weeks status post right knee arthroscopy including an arthroscopic lateral release and then an open plication of the medial patellofemoral ligament. Doing well without complication  Plan: Range of motion exercises. Crutches as necessary, continue with knee immobilizer. unable to work   Follow-Up Instructions: No Follow-up on file.   Orders:  No orders of the defined types were placed in this encounter.  No orders of the defined types were placed in this encounter.     Procedures: No procedures performed   Clinical Data: No additional findings.   Subjective: Chief Complaint  Patient presents with  . Right Knee - Follow-up    Patient is 46 y.o female she is status postpost right knee arthroscopy with lateral release and plication of the medial patellofemoral ligament. She is in knee immobilizer. She occasionally uses crutches while in the house. Incision is healing well. She is has been trying to move leg some, and can hear noticeable crunching sound upon movement. Overall she is doing better. She takes tramadol for her pain.     Review of Systems   Objective: Vital Signs: BP (!) 148/98 (BP Location: Left Arm, Patient Position: Sitting)   Pulse (!) 110   Ht 5\' 5"  (1.651 m)   Wt 232 lb (105.2 kg)   BMI 38.61 kg/m   Physical Exam  Ortho Exam right knee exam out of the knee immobilizer. Little if any effusion or swelling. Incision is healing nicely without evidence of infection. No calf pain. Neurovascular exam intact. full extension right knee. Approximately 50-60 of flexion. No calf pain.  Specialty Comments:  No specialty comments available.  Imaging: No results  found.   PMFS History: Patient Active Problem List   Diagnosis Date Noted  . Fibromyalgia 12/25/2016  . Other fatigue 12/25/2016  . Primary insomnia 12/25/2016  . Primary osteoarthritis of both knees 12/25/2016  . ANA positive 12/25/2016  . History of gastroesophageal reflux (GERD) 12/25/2016  . History of anemia 12/25/2016  . Trochanteric bursitis of both hips 12/25/2016  . Anemia, iron deficiency 04/18/2015  . Dysphagia, pharyngoesophageal phase 11/14/2011  . Esophageal reflux 11/14/2011   Past Medical History:  Diagnosis Date  . ADD (attention deficit disorder with hyperactivity)   . Allergic rhinitis   . Anxiety   . Arthritis   . Depression   . Dysmenorrhea   . Fibromyalgia   . GERD (gastroesophageal reflux disease)   . Insomnia   . Irritable bowel syndrome   . Restless leg syndrome     Family History  Problem Relation Age of Onset  . Breast cancer Maternal Grandmother   . Diabetes Sister   . Heart disease Brother   . Colon cancer Neg Hx   . Colon polyps Neg Hx   . Esophageal cancer Neg Hx   . Kidney disease Neg Hx   . Gallbladder disease Neg Hx     Past Surgical History:  Procedure Laterality Date  . BUNIONECTOMY  2008   left  . DILATION AND CURETTAGE OF UTERUS  2004  . EAR CYST EXCISION  1983   Left   Social History   Occupational History  . Disabled    Social History Main Topics  . Smoking status: Former Smoker    Packs/day: 0.10    Years: 12.00    Types: Cigarettes    Quit date: 12/27/1993  . Smokeless tobacco: Never Used  . Alcohol use No  . Drug use: No  . Sexual activity: Not on file

## 2017-02-06 ENCOUNTER — Encounter (INDEPENDENT_AMBULATORY_CARE_PROVIDER_SITE_OTHER): Payer: Self-pay | Admitting: Orthopedic Surgery

## 2017-02-06 ENCOUNTER — Ambulatory Visit (INDEPENDENT_AMBULATORY_CARE_PROVIDER_SITE_OTHER): Payer: PPO | Admitting: Orthopedic Surgery

## 2017-02-06 VITALS — BP 140/90 | HR 100 | Resp 14 | Ht 68.0 in | Wt 236.0 lb

## 2017-02-06 DIAGNOSIS — Z9889 Other specified postprocedural states: Secondary | ICD-10-CM

## 2017-02-06 NOTE — Progress Notes (Signed)
Office Visit Note   Patient: [name]           Date of Birth: 12/01/1971           MRN: [id number] Visit Date: 02/06/2017              Requested by: Donald Prose, MD [place], [place] [address] PCP: Lynne Logan, MD   Assessment & Plan: Visit Diagnoses:  1. Post-operative state   2. Status post lateral retinacular release right knee with medial imbrication  Plan:  #1: Continue exercise program as taught. Continue to use knee immobilizer but is to continue doing her motion exercises. #2: Follow back up in 2 weeks for recheck evaluation  Follow-Up Instructions: Return in about 2 weeks (around 02/20/2017).   Orders:  No orders of the defined types were placed in this encounter.  No orders of the defined types were placed in this encounter.     Procedures: No procedures performed   Clinical Data: No additional findings.   Subjective: Chief Complaint  Patient presents with  . Right Knee - Routine Post Op    Patient is 46 y.o female she is 4 status postpost right knee arthroscopy with lateral release and plication of the medial patellofemoral ligament. She is in knee immobilizer. Incision is healing well. She is has been having a "buring" sensation over incision site.  Overall she is doing better. She takes tramadol for her paiin. She also is doing the PT exercises she showed on last visit.    Review of Systems   Objective: Vital Signs: BP 140/90   Pulse 100   Resp 14   Ht 5\' 8"  (1.727 m)   Wt 236 lb (107 kg)   BMI 35.88 kg/m   Physical Exam  Musculoskeletal:       Right knee: She exhibits no effusion.    Right Knee Exam   Tenderness  The patient is experiencing tenderness in the pes anserinus.  Other  Other tests: no effusion present  Comments:  Wound is well-healed. No swelling over the patella. No real effusion. She has motion from 0 to about 95 actively. All nontender. No warmth or erythema. Distal little  bit of crepitus with range of motion.      Specialty Comments:  No specialty comments available.  Imaging: No results found.   PMFS History: Patient Active Problem List   Diagnosis Date Noted  . Fibromyalgia 12/25/2016  . Other fatigue 12/25/2016  . Primary insomnia 12/25/2016  . Primary osteoarthritis of both knees 12/25/2016  . ANA positive 12/25/2016  . History of gastroesophageal reflux (GERD) 12/25/2016  . History of anemia 12/25/2016  . Trochanteric bursitis of both hips 12/25/2016  . Anemia, iron deficiency 04/18/2015  . Dysphagia, pharyngoesophageal phase 11/14/2011  . Esophageal reflux 11/14/2011   Past Medical History:  Diagnosis Date  . ADD (attention deficit disorder with hyperactivity)   . Allergic rhinitis   . Anxiety   . Arthritis   . Depression   . Dysmenorrhea   . Fibromyalgia   . GERD (gastroesophageal reflux disease)   . Insomnia   . Irritable bowel syndrome   . Restless leg syndrome     Family History  Problem Relation Age of Onset  . Breast cancer Maternal Grandmother   . Diabetes Sister   . Heart disease Brother   . Colon cancer Neg Hx   . Colon polyps Neg Hx   . Esophageal cancer Neg Hx   .  Kidney disease Neg Hx   . Gallbladder disease Neg Hx     Past Surgical History:  Procedure Laterality Date  . BUNIONECTOMY  2008   left  . DILATION AND CURETTAGE OF UTERUS  2004  . EAR CYST EXCISION  1983   Left   Social History   Occupational History  . Disabled    Social History Main Topics  . Smoking status: Former Smoker    Packs/day: 0.10    Years: 12.00    Types: Cigarettes    Quit date: 12/27/1993  . Smokeless tobacco: Never Used  . Alcohol use No  . Drug use: No  . Sexual activity: Not on file

## 2017-02-25 ENCOUNTER — Ambulatory Visit (INDEPENDENT_AMBULATORY_CARE_PROVIDER_SITE_OTHER): Payer: PPO | Admitting: Orthopaedic Surgery

## 2017-02-26 ENCOUNTER — Encounter (INDEPENDENT_AMBULATORY_CARE_PROVIDER_SITE_OTHER): Payer: Self-pay | Admitting: Orthopaedic Surgery

## 2017-02-26 ENCOUNTER — Ambulatory Visit (INDEPENDENT_AMBULATORY_CARE_PROVIDER_SITE_OTHER): Payer: PPO | Admitting: Orthopaedic Surgery

## 2017-02-26 ENCOUNTER — Other Ambulatory Visit (INDEPENDENT_AMBULATORY_CARE_PROVIDER_SITE_OTHER): Payer: Self-pay

## 2017-02-26 VITALS — BP 138/91 | HR 124 | Resp 14 | Ht 65.0 in | Wt 235.0 lb

## 2017-02-26 DIAGNOSIS — G8929 Other chronic pain: Secondary | ICD-10-CM | POA: Diagnosis not present

## 2017-02-26 DIAGNOSIS — M25561 Pain in right knee: Secondary | ICD-10-CM | POA: Diagnosis not present

## 2017-02-26 NOTE — Progress Notes (Signed)
Office Visit Note   Patient: [name]           Date of Birth: 01/21/1971           MRN: [id number] Visit Date: 02/26/2017              Requested by: Donald Prose, MD [place], [place] [address] PCP: Lynne Logan, MD   Assessment & Plan: Visit Diagnoses: 6 weeks status post right knee arthroscopic lateral release and open medial patellofemoral ligament plication-doing well  Plan:es: Knee buttress support, continue home exercises follow-up as needed. No procedures performed   Clinical Data: No additional findings.   Subjective: Chief Complaint  Patient presents with  . Right Knee - Follow-up    01/10/17 Arthroscopic Right Knee, tenderness, good range of motion, no swelling, no pain  6 weeks status post arthroscopic lateral release right knee with open medial patellofemoral ligament plication for chronic lateral patella subluxation. Very happy with her present results. Finish her course of physical therapy with a home exercise program. Present sensation of patella subluxation  Review of Systems   Objective: Vital Signs: BP (!) 138/91 (BP Location: Right Arm, Patient Position: Sitting, Cuff Size: Normal)   Pulse (!) 124   Resp 14   Ht 5\' 5"  (1.651 m)   Wt 235 lb (106.6 kg)   LMP 02/10/2017   BMI 39.11 kg/m   Physical Exam  Ortho Exam right knee exam without evidence of effusion. No patella crepitation or apprehension. Midline incision is healing with a keloid formation. Arthroscopic portals healed nicely. No calf pain. No distal edema. Neurovascular exam intact. Full extension and flexion over 105  Specialty Comments:  No specialty comments available.  Imaging: No results found.   PMFS History: Patient Active Problem List   Diagnosis Date Noted  . Fibromyalgia 12/25/2016  . Other fatigue 12/25/2016  . Primary insomnia 12/25/2016  . Primary osteoarthritis of both knees 12/25/2016  . ANA positive 12/25/2016  . History  of gastroesophageal reflux (GERD) 12/25/2016  . History of anemia 12/25/2016  . Trochanteric bursitis of both hips 12/25/2016  . Anemia, iron deficiency 04/18/2015  . Dysphagia, pharyngoesophageal phase 11/14/2011  . Esophageal reflux 11/14/2011   Past Medical History:  Diagnosis Date  . ADD (attention deficit disorder with hyperactivity)   . Allergic rhinitis   . Anxiety   . Arthritis   . Depression   . Dysmenorrhea   . Fibromyalgia   . GERD (gastroesophageal reflux disease)   . Insomnia   . Irritable bowel syndrome   . Restless leg syndrome     Family History  Problem Relation Age of Onset  . Breast cancer Maternal Grandmother   . Diabetes Sister   . Heart disease Brother   . Stroke Mother   . Colon cancer Neg Hx   . Colon polyps Neg Hx   . Esophageal cancer Neg Hx   . Kidney disease Neg Hx   . Gallbladder disease Neg Hx     Past Surgical History:  Procedure Laterality Date  . BUNIONECTOMY  2008   left  . DILATION AND CURETTAGE OF UTERUS  2004  . EAR CYST EXCISION  1983   Left   Social History   Occupational History  . Disabled    Social History Main Topics  . Smoking status: Former Smoker    Packs/day: 0.10    Years: 12.00    Types: Cigarettes    Quit date: 12/27/1993  . Smokeless  tobacco: Never Used  . Alcohol use No  . Drug use: No  . Sexual activity: Not on file

## 2017-03-28 ENCOUNTER — Encounter (INDEPENDENT_AMBULATORY_CARE_PROVIDER_SITE_OTHER): Payer: Self-pay | Admitting: Orthopaedic Surgery

## 2017-03-28 ENCOUNTER — Ambulatory Visit (INDEPENDENT_AMBULATORY_CARE_PROVIDER_SITE_OTHER): Payer: PPO | Admitting: Orthopaedic Surgery

## 2017-03-28 VITALS — BP 140/97 | HR 103 | Ht 64.0 in | Wt 240.0 lb

## 2017-03-28 DIAGNOSIS — M25561 Pain in right knee: Secondary | ICD-10-CM

## 2017-03-28 DIAGNOSIS — G8929 Other chronic pain: Secondary | ICD-10-CM

## 2017-03-28 NOTE — Progress Notes (Signed)
Office Visit Note   Patient: [name]           Date of Birth: 05/04/1971           MRN: [id number] Visit Date: 03/28/2017              Requested by: Donald Prose, MD [place], [place] [address] PCP: Lynne Logan, MD   Assessment & Plan: Visit Diagnoses:  1. Chronic pain of right knee   10 weeks status post right knee arthroscopy with arthroscopic lateral release and open medial plication of the medial patellofemoral ligament. Overall appears to be doing well. Still some weakness of the quadriceps  Plan: Continue with cane as needed, [employer] to work with exercise bicycle and machines. Office 1 month  Follow-Up Instructions: Return in about 1 month (around 04/27/2017).   Orders:  No orders of the defined types were placed in this encounter.  No orders of the defined types were placed in this encounter.     Procedures: No procedures performed   Clinical Data: No additional findings.   Subjective: Chief Complaint  Patient presents with  . Right Knee - Routine Post Op    [name] is 2 1/2 months status post right knee arthroscopy. She relates even with the brace, her kneecap is trying to move left and she has pain in the patella region. Ambulates with a cane   Definite improvement in pain and ambulatory status over the past month. Mrs. Goodman has been experiencing some "warmth" and occasional pain along the medial aspect of her patella and had some concerns. No fever or chills. No numbness or tingling. Spurring and some discomfort as the day progresses with "quivering" of the muscles HPI  Review of Systems   Objective: Vital Signs: BP (!) 140/97   Pulse (!) 103   Ht 5\' 4"  (1.626 m)   Wt 240 lb (108.9 kg)   BMI 41.20 kg/m   Physical Exam  Ortho Exam right knee exam with well healing incision associated with keloid formation. No obvious effusion. No apprehension with patellar motion. Full extension and 108 of  knee flexion. No calf pain. No swelling distally. Neurovascular exam intact.  Specialty Comments:  No specialty comments available.  Imaging: No results found.   PMFS History: Patient Active Problem List   Diagnosis Date Noted  . Fibromyalgia 12/25/2016  . Other fatigue 12/25/2016  . Primary insomnia 12/25/2016  . Primary osteoarthritis of both knees 12/25/2016  . ANA positive 12/25/2016  . History of gastroesophageal reflux (GERD) 12/25/2016  . History of anemia 12/25/2016  . Trochanteric bursitis of both hips 12/25/2016  . Anemia, iron deficiency 04/18/2015  . Dysphagia, pharyngoesophageal phase 11/14/2011  . Esophageal reflux 11/14/2011   Past Medical History:  Diagnosis Date  . ADD (attention deficit disorder with hyperactivity)   . Allergic rhinitis   . Anxiety   . Arthritis   . Depression   . Dysmenorrhea   . Fibromyalgia   . GERD (gastroesophageal reflux disease)   . Insomnia   . Irritable bowel syndrome   . Restless leg syndrome     Family History  Problem Relation Age of Onset  . Breast cancer Maternal Grandmother   . Diabetes Sister   . Heart disease Brother   . Stroke Mother   . Colon cancer Neg Hx   . Colon polyps Neg Hx   . Esophageal cancer Neg Hx   . Kidney disease Neg Hx   .  Gallbladder disease Neg Hx     Past Surgical History:  Procedure Laterality Date  . BUNIONECTOMY  2008   left  . DILATION AND CURETTAGE OF UTERUS  2004  . EAR CYST EXCISION  1983   Left   Social History   Occupational History  . Disabled    Social History Main Topics  . Smoking status: Former Smoker    Packs/day: 0.10    Years: 12.00    Types: Cigarettes    Quit date: 12/27/1993  . Smokeless tobacco: Never Used  . Alcohol use No  . Drug use: No  . Sexual activity: Not on file

## 2017-04-20 DIAGNOSIS — N760 Acute vaginitis: Secondary | ICD-10-CM | POA: Diagnosis not present

## 2017-04-20 DIAGNOSIS — N898 Other specified noninflammatory disorders of vagina: Secondary | ICD-10-CM | POA: Diagnosis not present

## 2017-04-22 ENCOUNTER — Encounter (INDEPENDENT_AMBULATORY_CARE_PROVIDER_SITE_OTHER): Payer: Self-pay | Admitting: Orthopaedic Surgery

## 2017-04-22 ENCOUNTER — Ambulatory Visit (INDEPENDENT_AMBULATORY_CARE_PROVIDER_SITE_OTHER): Payer: PPO | Admitting: Orthopaedic Surgery

## 2017-04-22 VITALS — BP 136/90 | HR 107 | Ht 64.0 in | Wt 240.0 lb

## 2017-04-22 DIAGNOSIS — G8929 Other chronic pain: Secondary | ICD-10-CM

## 2017-04-22 DIAGNOSIS — M25561 Pain in right knee: Secondary | ICD-10-CM

## 2017-04-22 NOTE — Progress Notes (Signed)
Office Visit Note   Patient: [name]           Date of Birth: 1971-04-05           MRN: [id number] Visit Date: 04/22/2017              Requested by: Donald Prose, MD [address], [place] [id number] PCP: Donald Prose, MD   Assessment & Plan: Visit Diagnoses:  1. Chronic pain of right knee   3 months status post arthroscopy right knee with lateral release and medial patellofemoral plication. Doing well  Plan: Continue strengthening exercises and weight loss. We'll see back as needed  Follow-Up Instructions: Return if symptoms worsen or fail to improve.   Orders:  No orders of the defined types were placed in this encounter.  No orders of the defined types were placed in this encounter.     Procedures: No procedures performed   Clinical Data: No additional findings.   Subjective: Chief Complaint  Patient presents with  . Right Knee - Routine Post Op    Ms. [name] is a 46 y o status post 3 months Right knee arthroscopy.   No related patellar apprehension. No fever or chills calf pain or distal edema. No significant knee pain  HPI  Review of Systems   Objective: Vital Signs: BP 136/90   Pulse (!) 107   Ht 5\' 4"  (1.626 m)   Wt 240 lb (108.9 kg)   BMI 41.20 kg/m   Physical Exam  Ortho Exam right knee with full extension and over 105 of flexion. No instability. Longitudinal knee incision healing with a keloid but without any pain. No knee effusion. No patella apprehension. No calf pain or distal edema. No pain with motion of patella  Specialty Comments:  No specialty comments available.  Imaging: No results found.   PMFS History: Patient Active Problem List   Diagnosis Date Noted  . Fibromyalgia 12/25/2016  . Other fatigue 12/25/2016  . Primary insomnia 12/25/2016  . Primary osteoarthritis of both knees 12/25/2016  . ANA positive 12/25/2016  . History of gastroesophageal reflux (GERD) 12/25/2016  .  History of anemia 12/25/2016  . Trochanteric bursitis of both hips 12/25/2016  . Anemia, iron deficiency 04/18/2015  . Dysphagia, pharyngoesophageal phase 11/14/2011  . Esophageal reflux 11/14/2011   Past Medical History:  Diagnosis Date  . ADD (attention deficit disorder with hyperactivity)   . Allergic rhinitis   . Anxiety   . Arthritis   . Depression   . Dysmenorrhea   . Fibromyalgia   . GERD (gastroesophageal reflux disease)   . Insomnia   . Irritable bowel syndrome   . Restless leg syndrome     Family History  Problem Relation Age of Onset  . Breast cancer Maternal Grandmother   . Diabetes Sister   . Heart disease Brother   . Stroke Mother   . Colon cancer Neg Hx   . Colon polyps Neg Hx   . Esophageal cancer Neg Hx   . Kidney disease Neg Hx   . Gallbladder disease Neg Hx     Past Surgical History:  Procedure Laterality Date  . BUNIONECTOMY  2008   left  . DILATION AND CURETTAGE OF UTERUS  2004  . EAR CYST EXCISION  1983   Left   Social History   Occupational History  . Disabled    Social History Main Topics  . Smoking status: Former Smoker    Packs/day: 0.10  Years: 12.00    Types: Cigarettes    Quit date: 12/27/1993  . Smokeless tobacco: Never Used  . Alcohol use No  . Drug use: No  . Sexual activity: Not on file     Garald Balding, MD   Note - This record has been created using [employer].  Chart creation errors have been sought, but may not always  have been located. Such creation errors do not reflect on  the standard of medical care.

## 2017-04-24 ENCOUNTER — Other Ambulatory Visit: Payer: Self-pay | Admitting: *Deleted

## 2017-04-24 MED ORDER — TRAMADOL HCL 50 MG PO TABS: 100.0000 mg | ORAL_TABLET | Freq: Two times a day (BID) | ORAL | 3 refills | Status: DC

## 2017-04-24 NOTE — Telephone Encounter (Signed)
Last Visit: 12/26/16 Next Visit:  06/25/17 UDS: 07/21/16 Narc Agreement: 07/20/16  Okay to refill Tramadol?

## 2017-04-24 NOTE — Telephone Encounter (Signed)
ok 

## 2017-04-30 ENCOUNTER — Encounter: Payer: Self-pay | Admitting: Gastroenterology

## 2017-04-30 ENCOUNTER — Ambulatory Visit (INDEPENDENT_AMBULATORY_CARE_PROVIDER_SITE_OTHER): Payer: PPO | Admitting: Gastroenterology

## 2017-04-30 VITALS — BP 124/82 | HR 84 | Ht 65.75 in | Wt 237.0 lb

## 2017-04-30 DIAGNOSIS — A048 Other specified bacterial intestinal infections: Secondary | ICD-10-CM

## 2017-04-30 DIAGNOSIS — G8929 Other chronic pain: Secondary | ICD-10-CM | POA: Diagnosis not present

## 2017-04-30 DIAGNOSIS — R1013 Epigastric pain: Secondary | ICD-10-CM | POA: Diagnosis not present

## 2017-04-30 MED ORDER — OMEPRAZOLE 40 MG PO CPDR: 40.0000 mg | DELAYED_RELEASE_CAPSULE | Freq: Every day | ORAL | 3 refills | Status: DC

## 2017-04-30 NOTE — Progress Notes (Signed)
[facility] Consult Note:  History: [name] 04/30/2017  Referring physician: Donald Prose, MD  Reason for consult/chief complaint: Abdominal Pain (burning x 1 month) and Gastroesophageal Reflux (usues OTC Prilosec 20mg  BID)   Subjective  HPI:  This is a 46 year old woman referred by primary care for epigastric pain. She previously saw Dr. Deatra Ina for dysphagia and iron deficiency anemia. Colonoscopy was normal in May 2016. Upper endoscopy in January 2013 was normal except for a benign-appearing small gastric polyp. This biopsy revealed hyperplastic tissue, but also positive for H. pylori. It is not clear which antibiotics were used for treatment. She says a Prevpac was originally tried, but insurance required something else. It does not appear that any confirmation of eradication was done afterward. She has been bothered by epigastric and chest burning for about the last month, it may be a little worse after meals. She denies dysphagia, odynophagia, nausea, vomiting, early satiety or weight loss. She has been on omeprazole 20 mg twice daily for years. She denies NSAID use.  ROS:  Review of Systems  Constitutional: Negative for appetite change and unexpected weight change.  HENT: Negative for mouth sores and voice change.   Eyes: Negative for pain and redness.  Respiratory: Negative for cough and shortness of breath.   Cardiovascular: Negative for chest pain and palpitations.  Genitourinary: Negative for dysuria and hematuria.  Musculoskeletal: Positive for arthralgias. Negative for myalgias.  Skin: Negative for pallor and rash.  Neurological: Negative for weakness and headaches.  Hematological: Negative for adenopathy.     Past Medical History: Past Medical History:  Diagnosis Date  . ADD (attention deficit disorder with hyperactivity)   . Allergic rhinitis   . Anxiety   . Arthritis   . Depression   . Dysmenorrhea   . Fibromyalgia   .  GERD (gastroesophageal reflux disease)   . Insomnia   . Irritable bowel syndrome   . Restless leg syndrome      Past Surgical History: Past Surgical History:  Procedure Laterality Date  . BUNIONECTOMY  2008   left  . DILATION AND CURETTAGE OF UTERUS  2004  . EAR CYST EXCISION  1983   Left     Family History: Family History  Problem Relation Age of Onset  . Breast cancer Maternal Grandmother   . Diabetes Sister   . Heart disease Brother   . Stroke Mother   . Colon cancer Neg Hx   . Colon polyps Neg Hx   . Esophageal cancer Neg Hx   . Kidney disease Neg Hx   . Gallbladder disease Neg Hx   . Stomach cancer Neg Hx     Social History: Social History   Social History  . Marital status: Married    Spouse name: N/A  . Number of children: 1  . Years of education: N/A   Occupational History  . Disabled    Social History Main Topics  . Smoking status: Former Smoker    Packs/day: 0.10    Years: 12.00    Types: Cigarettes    Quit date: 12/27/1993  . Smokeless tobacco: Never Used  . Alcohol use No  . Drug use: No  . Sexual activity: Yes   Other Topics Concern  . None   Social History Narrative  . None    Allergies: No Known Allergies  Outpatient Meds: Current Outpatient Prescriptions  Medication Sig Dispense Refill  . cyclobenzaprine (FLEXERIL) 10 MG tablet Take 1 tablet (10 mg total) by mouth  at bedtime. For muscle spasms 30 tablet 5  . DULoxetine (CYMBALTA) 30 MG capsule Take 30 mg by mouth 2 (two) times daily.  1  . gabapentin (NEURONTIN) 300 MG capsule Take 600 mg by mouth at bedtime.     Marland Kitchen oxyCODONE-acetaminophen (PERCOCET/ROXICET) 5-325 MG tablet     . traMADol (ULTRAM) 50 MG tablet Take 2 tablets (100 mg total) by mouth 2 (two) times daily. For pain 120 tablet 3  . Vitamin D, Ergocalciferol, (DRISDOL) 50000 UNITS CAPS capsule Take 50,000 Units by mouth. Pt takes twice a week  0  . VOLTAREN 1 % GEL 3 grams tid 3 Tube 3  . omeprazole (PRILOSEC) 40 MG  capsule Take 1 capsule (40 mg total) by mouth daily. 30 capsule 3  . zolpidem (AMBIEN) 10 MG tablet Take 1 tablet (10 mg total) by mouth at bedtime as needed for sleep. 30 tablet 5   No current facility-administered medications for this visit.       ___________________________________________________________________ Objective   Exam:  BP 124/82   Pulse 84   Ht 5' 5.75" (1.67 m) Comment: w/o shoes  Wt 237 lb (107.5 kg)   BMI 38.55 kg/m    General: this is a(n) Overweight middle-aged woman who is otherwise well-appearing   Eyes: sclera anicteric, no redness  ENT: oral mucosa moist without lesions, no cervical or supraclavicular lymphadenopathy, good dentition  CV: RRR without murmur, S1/S2, no JVD, no peripheral edema  Resp: clear to auscultation bilaterally, normal RR and effort noted  GI: soft, mild epigastric tenderness, with active bowel sounds. No guarding or palpable organomegaly noted.  Skin; warm and dry, no rash or jaundice noted  Neuro: awake, alert and oriented x 3. Normal gross motor function and fluent speech  No recent labs  Assessment: Encounter Diagnoses  Name Primary?  . Abdominal pain, chronic, epigastric Yes  . H. pylori infection     Recurrence of chronic epigastric pain, cause is unclear. She has not been using NSAIDs, and H. pylori was treated in the past. If H. pylori eradicated, ulcer seems unlikely.  Plan:  Urea breath test after she is off omeprazole for 5 days After that, began omeprazole 40 mg once in the morning. If insufficient symptom relief, add 20 mg OTC omeprazole in the evening.   Thank you for the courtesy of this consult.  Please call me with any questions or concerns.  Nelida Meuse III  CC: Donald Prose, MD

## 2017-04-30 NOTE — Patient Instructions (Signed)
If you are age 48 or older, your body mass index should be between 23-30. Your Body mass index is 38.55 kg/m. If this is out of the aforementioned range listed, please consider follow up with your Primary Care Provider.  If you are age 13 or younger, your body mass index should be between 19-25. Your Body mass index is 38.55 kg/m. If this is out of the aformentioned range listed, please consider follow up with your Primary Care Provider.   Your physician has requested that you have a H.pylori breath test. Please take your lab order to a [facility] lab and have completed. Hold Omeprazole 5 days prior.  Thank you for choosing [facility]  Dr Wilfrid Lund III

## 2017-05-01 LAB — H. PYLORI BREATH TEST: H. pylori Breath Test: NOT DETECTED

## 2017-05-02 ENCOUNTER — Telehealth: Payer: Self-pay | Admitting: Rheumatology

## 2017-05-02 NOTE — Telephone Encounter (Signed)
Prescription was authorized on 04/24/17. Called a verbal prescription to pharmacy.

## 2017-05-02 NOTE — Telephone Encounter (Signed)
Pharmacy calling to get approval for refill on patients Tramadol 50mg . States they faxed request around 5/11. Please call or fax to advise.

## 2017-05-20 ENCOUNTER — Emergency Department (HOSPITAL_COMMUNITY)
Admission: EM | Admit: 2017-05-20 | Discharge: 2017-05-20 | Disposition: A | Discharge status: Home / Self Care | Payer: PPO | Attending: Emergency Medicine | Admitting: Emergency Medicine

## 2017-05-20 ENCOUNTER — Emergency Department (HOSPITAL_COMMUNITY): Payer: PPO

## 2017-05-20 ENCOUNTER — Encounter (HOSPITAL_COMMUNITY): Payer: Self-pay | Admitting: Emergency Medicine

## 2017-05-20 DIAGNOSIS — F909 Attention-deficit hyperactivity disorder, unspecified type: Secondary | ICD-10-CM | POA: Diagnosis not present

## 2017-05-20 DIAGNOSIS — R000 Tachycardia, unspecified: Secondary | ICD-10-CM | POA: Diagnosis not present

## 2017-05-20 DIAGNOSIS — J189 Pneumonia, unspecified organism: Secondary | ICD-10-CM | POA: Diagnosis not present

## 2017-05-20 DIAGNOSIS — Z87891 Personal history of nicotine dependence: Secondary | ICD-10-CM | POA: Diagnosis not present

## 2017-05-20 DIAGNOSIS — R079 Chest pain, unspecified: Secondary | ICD-10-CM | POA: Diagnosis not present

## 2017-05-20 DIAGNOSIS — Z79899 Other long term (current) drug therapy: Secondary | ICD-10-CM | POA: Insufficient documentation

## 2017-05-20 DIAGNOSIS — Z5321 Procedure and treatment not carried out due to patient leaving prior to being seen by health care provider: Secondary | ICD-10-CM | POA: Diagnosis not present

## 2017-05-20 DIAGNOSIS — R05 Cough: Secondary | ICD-10-CM | POA: Diagnosis not present

## 2017-05-20 DIAGNOSIS — R0602 Shortness of breath: Secondary | ICD-10-CM | POA: Diagnosis not present

## 2017-05-20 LAB — COMPREHENSIVE METABOLIC PANEL
ALT: 18 U/L (ref 14–54)
AST: 20 U/L (ref 15–41)
Albumin: 3.5 g/dL (ref 3.5–5.0)
Alkaline Phosphatase: 92 U/L (ref 38–126)
Anion gap: 9 (ref 5–15)
BUN: 11 mg/dL (ref 6–20)
CO2: 25 mmol/L (ref 22–32)
Calcium: 8.5 mg/dL — ABNORMAL LOW (ref 8.9–10.3)
Chloride: 102 mmol/L (ref 101–111)
Creatinine, Ser: 0.88 mg/dL (ref 0.44–1.00)
GFR calc Af Amer: >60 mL/min (ref >60)
GFR calc non Af Amer: >60 mL/min (ref >60)
Glucose, Bld: 142 mg/dL — ABNORMAL HIGH (ref 65–99)
Potassium: 3.5 mmol/L (ref 3.5–5.1)
Sodium: 136 mmol/L (ref 135–145)
Total Bilirubin: 0.3 mg/dL (ref 0.3–1.2)
Total Protein: 7.7 g/dL (ref 6.5–8.1)

## 2017-05-20 LAB — CBC WITH DIFFERENTIAL/PLATELET
Basophils Absolute: 0 10*3/uL (ref 0.0–0.1)
Basophils Relative: 0 %
Eosinophils Absolute: 0.1 10*3/uL (ref 0.0–0.7)
Eosinophils Relative: 1 %
HCT: 33.5 % — ABNORMAL LOW (ref 36.0–46.0)
Hemoglobin: 10.1 g/dL — ABNORMAL LOW (ref 12.0–15.0)
Lymphocytes Relative: 26 %
Lymphs Abs: 2.3 10*3/uL (ref 0.7–4.0)
MCH: 19.9 pg — ABNORMAL LOW (ref 26.0–34.0)
MCHC: 30.1 g/dL (ref 30.0–36.0)
MCV: 65.9 fL — ABNORMAL LOW (ref 78.0–100.0)
Monocytes Absolute: 0.8 10*3/uL (ref 0.1–1.0)
Monocytes Relative: 9 %
Neutro Abs: 5.8 10*3/uL (ref 1.7–7.7)
Neutrophils Relative %: 64 %
Platelets: 280 10*3/uL (ref 150–400)
RBC: 5.08 MIL/uL (ref 3.87–5.11)
RDW: 19.8 % — ABNORMAL HIGH (ref 11.5–15.5)
WBC: 9 10*3/uL (ref 4.0–10.5)

## 2017-05-20 LAB — I-STAT CG4 LACTIC ACID, ED: Lactic Acid, Venous: 1.29 mmol/L (ref 0.5–1.9)

## 2017-05-20 LAB — I-STAT BETA HCG BLOOD, ED (MC, WL, AP ONLY): I-stat hCG, quantitative: <5 m[IU]/mL (ref <5)

## 2017-05-20 LAB — I-STAT TROPONIN, ED: Troponin i, poc: 0 ng/mL (ref 0.00–0.08)

## 2017-05-20 MED ORDER — SODIUM CHLORIDE 0.9 % IV BOLUS (SEPSIS)
1000.0000 mL | Freq: Once | INTRAVENOUS | Status: AC
  Administered 2017-05-20: 1000 mL via INTRAVENOUS

## 2017-05-20 MED ORDER — ALBUTEROL SULFATE (2.5 MG/3ML) 0.083% IN NEBU
5.0000 mg | INHALATION_SOLUTION | Freq: Once | RESPIRATORY_TRACT | Status: AC
  Administered 2017-05-20: 5 mg via RESPIRATORY_TRACT
  Filled 2017-05-20: qty 6

## 2017-05-20 MED ORDER — AZITHROMYCIN 250 MG PO TABS
ORAL_TABLET | ORAL | 0 refills | Status: DC
Start: 2017-05-20 — End: 2017-06-25

## 2017-05-20 MED ORDER — DEXTROSE 5 % IV SOLN: 1.0000 g | INTRAVENOUS | Status: DC

## 2017-05-20 MED ORDER — AMOXICILLIN 500 MG PO CAPS: 1000.0000 mg | ORAL_CAPSULE | Freq: Three times a day (TID) | ORAL | 0 refills | Status: DC

## 2017-05-20 MED ORDER — ACETAMINOPHEN 500 MG PO TABS
1000.0000 mg | ORAL_TABLET | Freq: Once | ORAL | Status: AC
  Administered 2017-05-20: 1000 mg via ORAL
  Filled 2017-05-20: qty 2

## 2017-05-20 MED ORDER — IBUPROFEN 200 MG PO TABS
400.0000 mg | ORAL_TABLET | Freq: Once | ORAL | Status: AC
  Administered 2017-05-20: 400 mg via ORAL
  Filled 2017-05-20: qty 2

## 2017-05-20 MED ORDER — DEXTROSE 5 % IV SOLN
500.0000 mg | Freq: Once | INTRAVENOUS | Status: AC
  Administered 2017-05-20: 500 mg via INTRAVENOUS
  Filled 2017-05-20: qty 500

## 2017-05-20 MED ORDER — SODIUM CHLORIDE 0.9 % IV BOLUS (SEPSIS)
500.0000 mL | Freq: Once | INTRAVENOUS | Status: AC
  Administered 2017-05-20: 500 mL via INTRAVENOUS

## 2017-05-20 MED ORDER — CEFTRIAXONE SODIUM 1 G IJ SOLR
1.0000 g | Freq: Once | INTRAMUSCULAR | Status: AC
  Administered 2017-05-20: 1 g via INTRAVENOUS
  Filled 2017-05-20: qty 10

## 2017-05-20 MED ORDER — DEXTROSE 5 % IV SOLN: 500.0000 mg | INTRAVENOUS | Status: DC

## 2017-05-20 NOTE — ED Notes (Signed)
Patient given cup of ice water and tolerating well.

## 2017-05-20 NOTE — Progress Notes (Signed)
Pharmacy Antibiotic Note  [name] is a 46 y.o. female admitted on 05/20/2017 with CAP.  Pharmacy has been consulted for ceftriaxone/azithromycin dosing.  Plan:  Azithromcyin 500 mg IV q24h  Ceftriaxone 1 gr IV q24h  Monitor clinical course, renal function, cultures as available   Dosage will likely remain stable at above dosaeg and need for further dosage adjustment appears unlikely at present.    Will sign off at this time.  Please reconsult if a change in clinical status warrants re-evaluation of dosage.    Height: 5\' 5"  (165.1 cm) Weight: 237 lb (107.5 kg) IBW/kg (Calculated) : 57  Temp (24hrs), Avg:98.7 F (37.1 C), Min:98.7 F (37.1 C), Max:98.7 F (37.1 C)  No results for input(s): WBC, CREATININE, LATICACIDVEN, VANCOTROUGH, VANCOPEAK, VANCORANDOM, GENTTROUGH, GENTPEAK, GENTRANDOM, TOBRATROUGH, TOBRAPEAK, TOBRARND, AMIKACINPEAK, AMIKACINTROU, AMIKACIN in the last 168 hours.  CrCl cannot be calculated (Patient's most recent lab result is older than the maximum 21 days allowed.).    No Known Allergies  Antimicrobials this admission: 6/4 ceftriaxone >>  6/4 azithromycin >>   Dose adjustments this admission: ---  Microbiology results: ----   Thank you for allowing pharmacy to be a part of this patient's care.  Royetta Asal, PharmD, BCPS Pager [phone] 05/20/2017 9:41 AM

## 2017-05-20 NOTE — ED Provider Notes (Signed)
[facility] DEPT Provider Note   CSN: [id number] Arrival date & time: 05/20/17  0830     History   Chief Complaint Chief Complaint  Patient presents with  . Shortness of Breath    HPI [name] is a 46 y.o. female.  Patient c/o non productive cough, rattling feeling in chest w cough, sob, general malaise, for the past 4 days. Symptoms acute onset, persistent, moderate, slowly worse. Poor po intake during that time. No vomiting or diarrhea. Denies sore throat or other viral uri symptoms. No headache. No chest pain. Cough episodic, non productive. No known ill contacts.    The history is provided by the patient.  Shortness of Breath  Associated symptoms include a fever and cough. Pertinent negatives include no headaches, no sore throat, no neck pain, no chest pain, no vomiting, no abdominal pain, no rash and no leg swelling.    Past Medical History:  Diagnosis Date  . ADD (attention deficit disorder with hyperactivity)   . Allergic rhinitis   . Anxiety   . Arthritis   . Depression   . Dysmenorrhea   . Fibromyalgia   . GERD (gastroesophageal reflux disease)   . Insomnia   . Irritable bowel syndrome   . Restless leg syndrome     Patient Active Problem List   Diagnosis Date Noted  . Fibromyalgia 12/25/2016  . Other fatigue 12/25/2016  . Primary insomnia 12/25/2016  . Primary osteoarthritis of both knees 12/25/2016  . ANA positive 12/25/2016  . History of gastroesophageal reflux (GERD) 12/25/2016  . History of anemia 12/25/2016  . Trochanteric bursitis of both hips 12/25/2016  . Anemia, iron deficiency 04/18/2015  . Dysphagia, pharyngoesophageal phase 11/14/2011  . Esophageal reflux 11/14/2011    Past Surgical History:  Procedure Laterality Date  . BUNIONECTOMY  2008   left  . DILATION AND CURETTAGE OF UTERUS  2004  . EAR CYST EXCISION  1983   Left    OB History    No data available       Home Medications    Prior to Admission  medications   Medication Sig Start Date End Date Taking? Authorizing Provider  cyclobenzaprine (FLEXERIL) 10 MG tablet Take 1 tablet (10 mg total) by mouth at bedtime. For muscle spasms 11/14/16   Bo Merino, MD  DULoxetine (CYMBALTA) 30 MG capsule Take 30 mg by mouth 2 (two) times daily. 04/05/15   [provider]  gabapentin (NEURONTIN) 300 MG capsule Take 600 mg by mouth at bedtime.  10/10/16   [provider]  omeprazole (PRILOSEC) 40 MG capsule Take 1 capsule (40 mg total) by mouth daily. 04/30/17   Doran Stabler, MD  oxyCODONE-acetaminophen (PERCOCET/ROXICET) 5-325 MG tablet  01/10/17   [provider]  traMADol (ULTRAM) 50 MG tablet Take 2 tablets (100 mg total) by mouth 2 (two) times daily. For pain 04/24/17   Bo Merino, MD  Vitamin D, Ergocalciferol, (DRISDOL) 50000 UNITS CAPS capsule Take 50,000 Units by mouth. Pt takes twice a week 02/10/15   [provider]  VOLTAREN 1 % GEL 3 grams tid 12/26/16   Bo Merino, MD  zolpidem (AMBIEN) 10 MG tablet Take 1 tablet (10 mg total) by mouth at bedtime as needed for sleep. 11/23/16 12/23/16  Eliezer Lofts, PA-C    Family History Family History  Problem Relation Age of Onset  . Breast cancer Maternal Grandmother   . Diabetes Sister   . Heart disease Brother   . Stroke Mother   .  Colon cancer Neg Hx   . Colon polyps Neg Hx   . Esophageal cancer Neg Hx   . Kidney disease Neg Hx   . Gallbladder disease Neg Hx   . Stomach cancer Neg Hx     Social History Social History  Substance Use Topics  . Smoking status: Former Smoker    Packs/day: 0.10    Years: 12.00    Types: Cigarettes    Quit date: 12/27/1993  . Smokeless tobacco: Never Used  . Alcohol use No     Allergies   Patient has no known allergies.   Review of Systems Review of Systems  Constitutional: Positive for fever.  HENT: Negative for sore throat.   Eyes: Negative for redness.  Respiratory: Positive for cough  and shortness of breath.   Cardiovascular: Negative for chest pain and leg swelling.  Gastrointestinal: Negative for abdominal pain and vomiting.  Genitourinary: Negative for dysuria and flank pain.  Musculoskeletal: Negative for back pain and neck pain.  Skin: Negative for rash.  Neurological: Negative for headaches.  Hematological: Does not bruise/bleed easily.  Psychiatric/Behavioral: Negative for confusion.     Physical Exam Updated Vital Signs BP 113/85 (BP Location: Left Arm)   Pulse (!) 125   Temp 98.7 F (37.1 C) (Oral)   Resp (!) 24   Ht 1.651 m (5\' 5" )   Wt 107.5 kg (237 lb)   LMP 04/22/2017   SpO2 100%   BMI 39.44 kg/m   Physical Exam  Constitutional: She appears well-developed and well-nourished. No distress.  HENT:  Nose: Nose normal.  Mouth/Throat: Oropharynx is clear and moist.  Eyes: Conjunctivae are normal. No scleral icterus.  Neck: Neck supple. No tracheal deviation present.  Cardiovascular: Regular rhythm, normal heart sounds and intact distal pulses.  Exam reveals no gallop and no friction rub.   No murmur heard. Tachycardic.   Pulmonary/Chest: Effort normal. No respiratory distress.  Rhonchi left  Abdominal: Soft. Normal appearance and bowel sounds are normal. She exhibits no distension. There is no tenderness.  Genitourinary:  Genitourinary Comments: No cva tenderness  Musculoskeletal: She exhibits no edema.  Neurological: She is alert.  Skin: Skin is warm and dry. No rash noted. She is not diaphoretic.  Psychiatric: She has a normal mood and affect.  Nursing note and vitals reviewed.    ED Treatments / Results  Labs (all labs ordered are listed, but only abnormal results are displayed)  Results for orders placed or performed during the hospital encounter of 05/20/17  Comprehensive metabolic panel  Result Value Ref Range   Sodium 136 135 - 145 mmol/L   Potassium 3.5 3.5 - 5.1 mmol/L   Chloride 102 101 - 111 mmol/L   CO2 25 22 - 32  mmol/L   Glucose, Bld 142 (H) 65 - 99 mg/dL   BUN 11 6 - 20 mg/dL   Creatinine, Ser 0.88 0.44 - 1.00 mg/dL   Calcium 8.5 (L) 8.9 - 10.3 mg/dL   Total Protein 7.7 6.5 - 8.1 g/dL   Albumin 3.5 3.5 - 5.0 g/dL   AST 20 15 - 41 U/L   ALT 18 14 - 54 U/L   Alkaline Phosphatase 92 38 - 126 U/L   Total Bilirubin 0.3 0.3 - 1.2 mg/dL   GFR calc non Af Amer >60 >60 mL/min   GFR calc Af Amer >60 >60 mL/min   Anion gap 9 5 - 15  CBC with Differential  Result Value Ref Range   WBC 9.0 4.0 -  10.5 K/uL   RBC 5.08 3.87 - 5.11 MIL/uL   Hemoglobin 10.1 (L) 12.0 - 15.0 g/dL   HCT 33.5 (L) 36.0 - 46.0 %   MCV 65.9 (L) 78.0 - 100.0 fL   MCH 19.9 (L) 26.0 - 34.0 pg   MCHC 30.1 30.0 - 36.0 g/dL   RDW 19.8 (H) 11.5 - 15.5 %   Platelets 280 150 - 400 K/uL   Neutrophils Relative % 64 %   Lymphocytes Relative 26 %   Monocytes Relative 9 %   Eosinophils Relative 1 %   Basophils Relative 0 %   Neutro Abs 5.8 1.7 - 7.7 K/uL   Lymphs Abs 2.3 0.7 - 4.0 K/uL   Monocytes Absolute 0.8 0.1 - 1.0 K/uL   Eosinophils Absolute 0.1 0.0 - 0.7 K/uL   Basophils Absolute 0.0 0.0 - 0.1 K/uL   RBC Morphology POLYCHROMASIA PRESENT   I-Stat CG4 Lactic Acid, ED  Result Value Ref Range   Lactic Acid, Venous 1.29 0.5 - 1.9 mmol/L  I-stat troponin, ED  Result Value Ref Range   Troponin i, poc 0.00 0.00 - 0.08 ng/mL   Comment 3          I-Stat Beta hCG blood, ED ([facility], [facility], [facility] only)  Result Value Ref Range   I-stat hCG, quantitative <5.0 <5 mIU/mL   Comment 3           Dg Chest 2 View  Result Date: 05/20/2017 CLINICAL DATA:  Cough, congestion, shortness of Breath, chest pain EXAM: CHEST  2 VIEW COMPARISON:  01/16/2014 FINDINGS: Patchy airspace disease in the lingula concerning for early infiltrate. Right lung is clear. Heart is normal size. No effusions. No acute bony abnormality. IMPRESSION: Patchy airspace disease in the lingula concerning for early pneumonia. Electronically Signed   By: Rolm Baptise M.D.   On: 05/20/2017  09:07    EKG  EKG Interpretation  Date/Time:  Monday May 20 2017 08:39:28 EDT Ventricular Rate:  122 PR Interval:    QRS Duration: 80 QT Interval:  310 QTC Calculation: 442 R Axis:   34 Text Interpretation:  Sinus tachycardia Baseline wander in lead(s) II III aVL aVF V1 V2 V3 V4 V6 Confirmed by Ashok Cordia  MD, Lennette Bihari ([id number]) on 05/20/2017 9:17:26 AM       Radiology Dg Chest 2 View  Result Date: 05/20/2017 CLINICAL DATA:  Cough, congestion, shortness of Breath, chest pain EXAM: CHEST  2 VIEW COMPARISON:  01/16/2014 FINDINGS: Patchy airspace disease in the lingula concerning for early infiltrate. Right lung is clear. Heart is normal size. No effusions. No acute bony abnormality. IMPRESSION: Patchy airspace disease in the lingula concerning for early pneumonia. Electronically Signed   By: Rolm Baptise M.D.   On: 05/20/2017 09:07    Procedures Procedures (including critical care time)  Medications Ordered in ED Medications  cefTRIAXone (ROCEPHIN) 1 g in dextrose 5 % 50 mL IVPB (not administered)  azithromycin (ZITHROMAX) 500 mg in dextrose 5 % 250 mL IVPB (not administered)  sodium chloride 0.9 % bolus 1,000 mL (not administered)  acetaminophen (TYLENOL) tablet 1,000 mg (not administered)  ibuprofen (ADVIL,MOTRIN) tablet 400 mg (not administered)  albuterol (PROVENTIL) (2.5 MG/3ML) 0.083% nebulizer solution 5 mg (5 mg Nebulization Given 05/20/17 0909)     Initial Impression / Assessment and Plan / ED Course  I have reviewed the triage vital signs and the nursing notes.  Pertinent labs & imaging results that were available during my care of the patient were reviewed by  me and considered in my medical decision making (see chart for details).  Iv ns bolus. Labs and cultures.  Motrin and tylenol po.  Iv abx given.   Discussed  xrays w pt.   Reviewed nursing notes and prior charts for additional history.   Productive cough in ed.    Pt is tolerating po fluids.  On recheck, hr  92, rr 16, pulse ox 97% room air.    Pt feels improved, and currently appears stable for d/c.    Final Clinical Impressions(s) / ED Diagnoses   Final diagnoses:  None    New Prescriptions New Prescriptions   No medications on file     Lajean Saver, MD 05/20/17 1230

## 2017-05-20 NOTE — ED Notes (Signed)
Bed: WA20 Expected date:  Expected time:  Means of arrival:  Comments: 

## 2017-05-20 NOTE — ED Triage Notes (Signed)
Pt complaint of SOB onset 0400 this morning with associated nonproductive cough and chest pain.

## 2017-05-20 NOTE — Discharge Instructions (Signed)
It was our pleasure to provide your ER care today - we hope that you feel better.  Take antibiotics (amoxicillin and zithromax) as prescribed.  Rest. Drink plenty of fluids.  Follow up with primary care doctor in 1 week if symptoms fail to improve/resolve.  Return to ER if worse, increased trouble breathing, weak/faint, other concern.

## 2017-05-22 ENCOUNTER — Encounter (HOSPITAL_COMMUNITY): Payer: Self-pay | Admitting: Emergency Medicine

## 2017-05-22 ENCOUNTER — Emergency Department (HOSPITAL_COMMUNITY)
Admission: EM | Admit: 2017-05-22 | Discharge: 2017-05-22 | Disposition: A | Discharge status: Home / Self Care | Payer: PPO | Attending: Emergency Medicine | Admitting: Emergency Medicine

## 2017-05-22 DIAGNOSIS — R0989 Other specified symptoms and signs involving the circulatory and respiratory systems: Secondary | ICD-10-CM | POA: Diagnosis not present

## 2017-05-22 DIAGNOSIS — R0602 Shortness of breath: Secondary | ICD-10-CM | POA: Diagnosis not present

## 2017-05-22 DIAGNOSIS — Z5321 Procedure and treatment not carried out due to patient leaving prior to being seen by health care provider: Secondary | ICD-10-CM | POA: Diagnosis not present

## 2017-05-22 NOTE — ED Triage Notes (Signed)
Per EMS pt from home , was seen here 2 days ago, DX and treated for pneumonia , no relief . Alert and oriented x 4,

## 2017-05-22 NOTE — ED Notes (Signed)
Called pt to place in room X1 No response

## 2017-05-22 NOTE — ED Triage Notes (Signed)
Pt dx with pneumonia 2 days ago. Pt states she continues to feel sick, felt nausea with vomiting today, shortness of breath. States abdominal pain after vomiting. Pt is taking antibiotics for pneumonia. No distress. EKG performed and given to MD>

## 2017-05-22 NOTE — ED Notes (Signed)
Pt called twice, no answer

## 2017-05-25 LAB — CULTURE, BLOOD (ROUTINE X 2)
Culture: NO GROWTH
Culture: NO GROWTH
Special Requests: ADEQUATE
Special Requests: ADEQUATE

## 2017-06-04 ENCOUNTER — Other Ambulatory Visit: Payer: Self-pay | Admitting: *Deleted

## 2017-06-04 MED ORDER — ZOLPIDEM TARTRATE 10 MG PO TABS: 10.0000 mg | ORAL_TABLET | Freq: Every evening | ORAL | 5 refills | Status: DC | PRN

## 2017-06-04 NOTE — Telephone Encounter (Signed)
Refill request received via fax  Last Visit: 12/26/16 Next Visit:  06/25/17  Okay to refill Ambien?

## 2017-06-04 NOTE — Telephone Encounter (Signed)
ok 

## 2017-06-18 ENCOUNTER — Other Ambulatory Visit (HOSPITAL_COMMUNITY)
Admission: RE | Admit: 2017-06-18 | Discharge: 2017-06-18 | Disposition: A | Discharge status: Home / Self Care | Payer: PPO | Source: Ambulatory Visit | Attending: Family Medicine | Admitting: Family Medicine

## 2017-06-18 ENCOUNTER — Other Ambulatory Visit: Payer: Self-pay | Admitting: Family Medicine

## 2017-06-18 DIAGNOSIS — D509 Iron deficiency anemia, unspecified: Secondary | ICD-10-CM | POA: Diagnosis not present

## 2017-06-18 DIAGNOSIS — Z124 Encounter for screening for malignant neoplasm of cervix: Secondary | ICD-10-CM | POA: Insufficient documentation

## 2017-06-18 DIAGNOSIS — F411 Generalized anxiety disorder: Secondary | ICD-10-CM | POA: Diagnosis not present

## 2017-06-18 DIAGNOSIS — Z136 Encounter for screening for cardiovascular disorders: Secondary | ICD-10-CM | POA: Diagnosis not present

## 2017-06-18 DIAGNOSIS — Z0000 Encounter for general adult medical examination without abnormal findings: Secondary | ICD-10-CM | POA: Diagnosis not present

## 2017-06-18 DIAGNOSIS — K219 Gastro-esophageal reflux disease without esophagitis: Secondary | ICD-10-CM | POA: Diagnosis not present

## 2017-06-18 DIAGNOSIS — Z01419 Encounter for gynecological examination (general) (routine) without abnormal findings: Secondary | ICD-10-CM | POA: Diagnosis not present

## 2017-06-18 DIAGNOSIS — M797 Fibromyalgia: Secondary | ICD-10-CM | POA: Diagnosis not present

## 2017-06-20 NOTE — Progress Notes (Signed)
Office Visit Note  Patient: [name]             Date of Birth: 1971-06-20           MRN: [id number]             PCP: Donald Prose, MD Referring: Donald Prose, MD Visit Date: 06/25/2017 Occupation: @GUAROCC @    Subjective:  Joint Pain (fell off porch last week )   History of Present Illness: Makylee Sanborn Goodman is a 46 y.o. female with history of fibromyalgia and osteoarthritis. She states she fell off her porch about a week ago and twisted her right ankle. She states the ankle is gradually getting better. She also had pneumonia in June for which she had to take antibiotics. She continues to have pain in her bilateral trochanteric bursa . She's been having some popping sensation in her knee joints but not much discomfort. Patient reports that her pain level is manageable with tramadol.  Activities of Daily Living:  Patient reports morning stiffness for 1 hour.   Patient Denies nocturnal pain.  Difficulty dressing/grooming: Denies Difficulty climbing stairs: Reports Difficulty getting out of chair: Reports Difficulty using hands for taps, buttons, cutlery, and/or writing: Denies   Review of Systems  Constitutional: Positive for fatigue. Negative for night sweats, weight gain, weight loss and weakness.  HENT: Positive for mouth dryness. Negative for mouth sores, trouble swallowing, trouble swallowing and nose dryness.   Eyes: Negative for pain, redness and visual disturbance.  Respiratory: Negative for cough, shortness of breath and difficulty breathing.   Cardiovascular: Negative for chest pain, palpitations, hypertension, irregular heartbeat and swelling in legs/feet.  Gastrointestinal: Negative for blood in stool, constipation and diarrhea.  Endocrine: Negative for increased urination.  Genitourinary: Negative for vaginal dryness.  Musculoskeletal: Positive for arthralgias, joint pain, myalgias, morning stiffness and myalgias. Negative for joint swelling, muscle  weakness and muscle tenderness.  Skin: Negative for color change, rash, hair loss, skin tightness, ulcers and sensitivity to sunlight.  Allergic/Immunologic: Negative for susceptible to infections.  Neurological: Negative for dizziness, memory loss and night sweats.  Hematological: Negative for swollen glands.  Psychiatric/Behavioral: Negative for depressed mood and sleep disturbance. The patient is not nervous/anxious.     PMFS History:  Patient Active Problem List   Diagnosis Date Noted  . Fibromyalgia 12/25/2016  . Other fatigue 12/25/2016  . Primary insomnia 12/25/2016  . Primary osteoarthritis of both knees 12/25/2016  . ANA positive 12/25/2016  . History of gastroesophageal reflux (GERD) 12/25/2016  . History of anemia 12/25/2016  . Trochanteric bursitis of both hips 12/25/2016  . Anemia, iron deficiency 04/18/2015  . Dysphagia, pharyngoesophageal phase 11/14/2011  . Esophageal reflux 11/14/2011    Past Medical History:  Diagnosis Date  . ADD (attention deficit disorder with hyperactivity)   . Allergic rhinitis   . Anxiety   . Arthritis   . Depression   . Dysmenorrhea   . Fibromyalgia   . GERD (gastroesophageal reflux disease)   . Insomnia   . Irritable bowel syndrome   . Restless leg syndrome     Family History  Problem Relation Age of Onset  . Breast cancer Maternal Grandmother   . Diabetes Sister   . Heart disease Brother   . Stroke Mother   . Colon cancer Neg Hx   . Colon polyps Neg Hx   . Esophageal cancer Neg Hx   . Kidney disease Neg Hx   . Gallbladder disease Neg Hx   . Stomach  cancer Neg Hx    Past Surgical History:  Procedure Laterality Date  . BUNIONECTOMY  2008   left  . DILATION AND CURETTAGE OF UTERUS  2004  . EAR CYST EXCISION  1983   Left   Social History   Social History Narrative  . No narrative on file     Objective: Vital Signs: Resp 14   Ht 5\' 5"  (1.651 m)   Wt 234 lb (106.1 kg)   LMP 06/10/2017   BMI 38.94 kg/m     Physical Exam  Constitutional: She is oriented to person, place, and time. She appears well-developed and well-nourished.  HENT:  Head: Normocephalic and atraumatic.  Eyes: Conjunctivae and EOM are normal.  Neck: Normal range of motion.  Cardiovascular: Normal rate, regular rhythm, normal heart sounds and intact distal pulses.   Pulmonary/Chest: Effort normal and breath sounds normal.  Abdominal: Soft. Bowel sounds are normal.  Lymphadenopathy:    She has no cervical adenopathy.  Neurological: She is alert and oriented to person, place, and time.  Skin: Skin is warm and dry. Capillary refill takes less than 2 seconds.  Psychiatric: She has a normal mood and affect. Her behavior is normal.  Nursing note and vitals reviewed.    Musculoskeletal Exam: C-spine and thoracic lumbar spine good range of motion. Shoulder joints elbow joints wrist joint MCPs PIPs DIPs with good range of motion. She is some tenderness over bilateral trochanteric bursa area. Crepitus with range of motion of the bilateral knee joints. I did not appreciate any warmth or swelling over her writing can joint. Fibromyalgia tender points were 12 out of 18 positive.  CDAI Exam: No CDAI exam completed.    Investigation: Findings:  UDS and Narcotic Agreement 07/30/2016   CBC Latest Ref Rng & Units 05/20/2017 04/18/2015 02/01/2013  WBC 4.0 - 10.5 K/uL 9.0 6.9 10.0  Hemoglobin 12.0 - 15.0 g/dL 10.1(L) 8.9 Repeated and verified X2.(L) 12.1  Hematocrit 36.0 - 46.0 % 33.5(L) 29.1(L) 38.5  Platelets 150 - 400 K/uL 280 386.0 332   CMP Latest Ref Rng & Units 05/20/2017 02/01/2013 07/25/2010  Glucose 65 - 99 mg/dL 142(H) 141(H) 123(H)  BUN 6 - 20 mg/dL 11 8 12   Creatinine 0.44 - 1.00 mg/dL 0.88 0.77 0.75  Sodium 135 - 145 mmol/L 136 137 139  Potassium 3.5 - 5.1 mmol/L 3.5 3.5 3.6  Chloride 101 - 111 mmol/L 102 100 106  CO2 22 - 32 mmol/L 25 23 24   Calcium 8.9 - 10.3 mg/dL 8.5(L) 8.9 9.0  Total Protein 6.5 - 8.1 g/dL 7.7 8.1 7.5   Total Bilirubin 0.3 - 1.2 mg/dL 0.3 0.4 0.4  Alkaline Phos 38 - 126 U/L 92 91 82  AST 15 - 41 U/L 20 12 17   ALT 14 - 54 U/L 18 11 17     Imaging: No results found.  Speciality Comments: No specialty comments available.    Procedures:  No procedures performed Allergies: Patient has no known allergies.   Assessment / Plan:     Visit Diagnoses: Fibromyalgia: She continues to have some generalized pain and discomfort and has positive tender points.  Other fatigue: She's been experiencing chronic fatigue.  Primary insomnia: Good sleep hygiene discussed.  Trochanteric bursitis of both hips: Her trochanteric bursa are still causing some discomfort. Stretching exercises were discussed.  Primary osteoarthritis of both knees: She has some crepitus but no warmth or swelling.  Right ankle injury: She does not have much swelling continue to monitor.  Vitamin D  deficiency - Plan: VITAMIN D 25 Hydroxy (Vit-D Deficiency, Fractures). I will check vitamin D levels today.  Medication monitoring encounter . She takes tramadol for pain management.- Plan: Pain Mgmt, Profile 5 w/Conf, U, Pain Mgmt, Tramadol w/medMATCH, U  ANA positive - 1:80 centromere pattern with no clinical features of autoimmune disease  History of anemia  History of gastroesophageal reflux (GERD)  Gastroesophageal reflux disease without esophagitis    Orders: Orders Placed This Encounter  Procedures  . Pain Mgmt, Profile 5 w/Conf, U  . Pain Mgmt, Tramadol w/medMATCH, U  . VITAMIN D 25 Hydroxy (Vit-D Deficiency, Fractures)   No orders of the defined types were placed in this encounter.   Face-to-face time spent with patient was 30 minutes. 50% of time was spent in counseling and coordination of care.  Follow-Up Instructions: Return in about 6 months (around 12/26/2017) for FMS,OA.   Bo Merino, MD  Note - This record has been created using Editor, commissioning.  Chart creation errors have been sought, but  may not always  have been located. Such creation errors do not reflect on  the standard of medical care.

## 2017-06-21 LAB — CYTOLOGY - PAP: Diagnosis: NEGATIVE

## 2017-06-25 ENCOUNTER — Ambulatory Visit (INDEPENDENT_AMBULATORY_CARE_PROVIDER_SITE_OTHER): Payer: PPO | Admitting: Rheumatology

## 2017-06-25 ENCOUNTER — Encounter: Payer: Self-pay | Admitting: Rheumatology

## 2017-06-25 VITALS — Resp 14 | Ht 65.0 in | Wt 234.0 lb

## 2017-06-25 DIAGNOSIS — E559 Vitamin D deficiency, unspecified: Secondary | ICD-10-CM | POA: Diagnosis not present

## 2017-06-25 DIAGNOSIS — Z862 Personal history of diseases of the blood and blood-forming organs and certain disorders involving the immune mechanism: Secondary | ICD-10-CM

## 2017-06-25 DIAGNOSIS — M7061 Trochanteric bursitis, right hip: Secondary | ICD-10-CM

## 2017-06-25 DIAGNOSIS — M7062 Trochanteric bursitis, left hip: Secondary | ICD-10-CM | POA: Diagnosis not present

## 2017-06-25 DIAGNOSIS — M797 Fibromyalgia: Secondary | ICD-10-CM | POA: Diagnosis not present

## 2017-06-25 DIAGNOSIS — Z8719 Personal history of other diseases of the digestive system: Secondary | ICD-10-CM

## 2017-06-25 DIAGNOSIS — R5383 Other fatigue: Secondary | ICD-10-CM

## 2017-06-25 DIAGNOSIS — R768 Other specified abnormal immunological findings in serum: Secondary | ICD-10-CM | POA: Diagnosis not present

## 2017-06-25 DIAGNOSIS — F5101 Primary insomnia: Secondary | ICD-10-CM | POA: Diagnosis not present

## 2017-06-25 DIAGNOSIS — M170 Bilateral primary osteoarthritis of knee: Secondary | ICD-10-CM

## 2017-06-25 DIAGNOSIS — K219 Gastro-esophageal reflux disease without esophagitis: Secondary | ICD-10-CM

## 2017-06-25 DIAGNOSIS — R7303 Prediabetes: Secondary | ICD-10-CM | POA: Diagnosis not present

## 2017-06-25 DIAGNOSIS — Z5181 Encounter for therapeutic drug level monitoring: Secondary | ICD-10-CM | POA: Diagnosis not present

## 2017-06-26 LAB — PAIN MGMT, PROFILE 5 W/CONF, U
Amphetamines: NEGATIVE ng/mL (ref <500)
Barbiturates: NEGATIVE ng/mL (ref <300)
Benzodiazepines: NEGATIVE ng/mL (ref <100)
Cocaine Metabolite: NEGATIVE ng/mL (ref <150)
Creatinine: 276.7 mg/dL (ref >=20.0)
Marijuana Metabolite: NEGATIVE ng/mL (ref <20)
Methadone Metabolite: NEGATIVE ng/mL (ref <100)
Opiates: NEGATIVE ng/mL (ref <100)
Oxidant: NEGATIVE ug/mL (ref <200)
Oxycodone: NEGATIVE ng/mL (ref <100)
pH: 6.81 (ref 4.5–9.0)

## 2017-06-26 LAB — VITAMIN D 25 HYDROXY (VIT D DEFICIENCY, FRACTURES): Vit D, 25-Hydroxy: 17 ng/mL — ABNORMAL LOW (ref 30–100)

## 2017-06-28 LAB — PAIN MGMT, TRAMADOL W/MEDMATCH, U
Desmethyltramadol: 8284 ng/mL — ABNORMAL HIGH (ref <100)
Tramadol: 30186 ng/mL — ABNORMAL HIGH (ref <100)

## 2017-06-28 NOTE — Progress Notes (Signed)
Call in vitamin D 50,000 units twice a week for 3 months to check vitamin D level in 3 months

## 2017-07-02 ENCOUNTER — Telehealth: Payer: Self-pay | Admitting: *Deleted

## 2017-07-02 DIAGNOSIS — E559 Vitamin D deficiency, unspecified: Secondary | ICD-10-CM

## 2017-07-02 MED ORDER — VITAMIN D (ERGOCALCIFEROL) 1.25 MG (50000 UNIT) PO CAPS
50000.0000 [IU] | ORAL_CAPSULE | ORAL | 0 refills | Status: DC
Start: 2017-07-04 — End: 2018-01-02

## 2017-07-02 NOTE — Telephone Encounter (Signed)
-----   Message from Bo Merino, MD sent at 06/28/2017  1:15 PM EDT ----- Call in vitamin D 50,000 units twice a week for 3 months to check vitamin D level in 3 months

## 2017-07-14 ENCOUNTER — Other Ambulatory Visit: Payer: Self-pay | Admitting: Rheumatology

## 2017-07-15 NOTE — Telephone Encounter (Signed)
Last Visit: 06/25/17 Next Visit due in January 2019. Message sent to the front to schedule patient.   Okay to refill per Dr. Estanislado Pandy.

## 2017-07-19 NOTE — Progress Notes (Deleted)
   Office Visit Note   Patient: [name]           Date of Birth: [birth year]           MRN: [id number] Visit Date: 01/21/2017              Requested by: Donald Prose, MD [place], [place] [address] PCP: Donald Prose, MD   Assessment & Plan: Visit Diagnoses: No diagnosis found.  Plan: ***  Follow-Up Instructions: No Follow-up on file.   Orders:  No orders of the defined types were placed in this encounter.  No orders of the defined types were placed in this encounter.     Procedures: No procedures performed   Clinical Data: No additional findings.   Subjective: No chief complaint on file.   2 week, 4 day status post right patella subluxation with mild tri compartment DJD      Review of Systems   Objective: Vital Signs: Ht 5\' 5"  (1.651 m)   Wt 232 lb (105.2 kg)   BMI 38.61 kg/m   Physical Exam  Ortho Exam  Specialty Comments:  No specialty comments available.  Imaging: No results found.   PMFS History: Patient Active Problem List   Diagnosis Date Noted  . Fibromyalgia 12/25/2016  . Other fatigue 12/25/2016  . Primary insomnia 12/25/2016  . Primary osteoarthritis of both knees 12/25/2016  . ANA positive 12/25/2016  . History of gastroesophageal reflux (GERD) 12/25/2016  . History of anemia 12/25/2016  . Trochanteric bursitis of both hips 12/25/2016  . Anemia, iron deficiency 04/18/2015  . Dysphagia, pharyngoesophageal phase 11/14/2011  . Esophageal reflux 11/14/2011   Past Medical History:  Diagnosis Date  . ADD (attention deficit disorder with hyperactivity)   . Allergic rhinitis   . Anxiety   . Arthritis   . Depression   . Dysmenorrhea   . Fibromyalgia   . GERD (gastroesophageal reflux disease)   . Insomnia   . Irritable bowel syndrome   . Restless leg syndrome     Family History  Problem Relation Age of Onset  . Breast cancer Maternal Grandmother   . Diabetes Sister   . Heart disease  Brother   . Stroke Mother   . Colon cancer Neg Hx   . Colon polyps Neg Hx   . Esophageal cancer Neg Hx   . Kidney disease Neg Hx   . Gallbladder disease Neg Hx   . Stomach cancer Neg Hx     Past Surgical History:  Procedure Laterality Date  . BUNIONECTOMY  2008   left  . DILATION AND CURETTAGE OF UTERUS  2004  . EAR CYST EXCISION  1983   Left   Social History   Occupational History  . Disabled    Social History Main Topics  . Smoking status: Former Smoker    Packs/day: 0.10    Years: 12.00    Types: Cigarettes    Quit date: 12/27/1993  . Smokeless tobacco: Never Used  . Alcohol use No  . Drug use: No  . Sexual activity: Yes

## 2017-07-19 NOTE — Progress Notes (Deleted)
   Office Visit Note   Patient: [name]           Date of Birth: 17-Mar-1971           MRN: [id number] Visit Date: 01/21/2017              Requested by: Donald Prose, MD [place], [place] [address] PCP: Donald Prose, MD   Assessment & Plan: Visit Diagnoses:  1. Erroneous encounter - disregard     Plan: ***  Follow-Up Instructions: No Follow-up on file.   Orders:  No orders of the defined types were placed in this encounter.  No orders of the defined types were placed in this encounter.     Procedures: No procedures performed   Clinical Data: No additional findings.   Subjective: No chief complaint on file.   HPI  Review of Systems   Objective:  Physical Exam  Ortho Exam  Specialty Comments:  No specialty comments available.  Imaging: No results found.   PMFS History: Patient Active Problem List   Diagnosis Date Noted  . Fibromyalgia 12/25/2016  . Other fatigue 12/25/2016  . Primary insomnia 12/25/2016  . Primary osteoarthritis of both knees 12/25/2016  . ANA positive 12/25/2016  . History of gastroesophageal reflux (GERD) 12/25/2016  . History of anemia 12/25/2016  . Trochanteric bursitis of both hips 12/25/2016  . Anemia, iron deficiency 04/18/2015  . Dysphagia, pharyngoesophageal phase 11/14/2011  . Esophageal reflux 11/14/2011   Past Medical History:  Diagnosis Date  . ADD (attention deficit disorder with hyperactivity)   . Allergic rhinitis   . Anxiety   . Arthritis   . Depression   . Dysmenorrhea   . Fibromyalgia   . GERD (gastroesophageal reflux disease)   . Insomnia   . Irritable bowel syndrome   . Restless leg syndrome     Family History  Problem Relation Age of Onset  . Breast cancer Maternal Grandmother   . Diabetes Sister   . Heart disease Brother   . Stroke Mother   . Colon cancer Neg Hx   . Colon polyps Neg Hx   . Esophageal cancer Neg Hx   . Kidney disease Neg Hx   .  Gallbladder disease Neg Hx   . Stomach cancer Neg Hx     Past Surgical History:  Procedure Laterality Date  . BUNIONECTOMY  2008   left  . DILATION AND CURETTAGE OF UTERUS  2004  . EAR CYST EXCISION  1983   Left   Social History   Occupational History  . Disabled    Social History Main Topics  . Smoking status: Former Smoker    Packs/day: 0.10    Years: 12.00    Types: Cigarettes    Quit date: 12/27/1993  . Smokeless tobacco: Never Used  . Alcohol use No  . Drug use: No  . Sexual activity: Yes

## 2017-08-06 ENCOUNTER — Ambulatory Visit (INDEPENDENT_AMBULATORY_CARE_PROVIDER_SITE_OTHER): Payer: PPO | Admitting: Orthopedic Surgery

## 2017-09-25 DIAGNOSIS — R7303 Prediabetes: Secondary | ICD-10-CM | POA: Diagnosis not present

## 2017-09-26 NOTE — Progress Notes (Signed)
Pt cancelled

## 2017-11-15 ENCOUNTER — Inpatient Hospital Stay (HOSPITAL_COMMUNITY)
Admission: AD | Admit: 2017-11-15 | Discharge: 2017-11-15 | Disposition: A | Discharge status: Home / Self Care | Payer: PPO | Source: Ambulatory Visit | Attending: Obstetrics & Gynecology | Admitting: Obstetrics & Gynecology

## 2017-11-15 ENCOUNTER — Other Ambulatory Visit: Payer: Self-pay

## 2017-11-15 ENCOUNTER — Encounter (HOSPITAL_COMMUNITY): Payer: Self-pay | Admitting: *Deleted

## 2017-11-15 DIAGNOSIS — K219 Gastro-esophageal reflux disease without esophagitis: Secondary | ICD-10-CM | POA: Diagnosis not present

## 2017-11-15 DIAGNOSIS — F909 Attention-deficit hyperactivity disorder, unspecified type: Secondary | ICD-10-CM | POA: Insufficient documentation

## 2017-11-15 DIAGNOSIS — R030 Elevated blood-pressure reading, without diagnosis of hypertension: Secondary | ICD-10-CM | POA: Insufficient documentation

## 2017-11-15 DIAGNOSIS — K589 Irritable bowel syndrome without diarrhea: Secondary | ICD-10-CM | POA: Diagnosis not present

## 2017-11-15 DIAGNOSIS — F419 Anxiety disorder, unspecified: Secondary | ICD-10-CM | POA: Diagnosis not present

## 2017-11-15 DIAGNOSIS — Z3202 Encounter for pregnancy test, result negative: Secondary | ICD-10-CM | POA: Diagnosis not present

## 2017-11-15 DIAGNOSIS — F329 Major depressive disorder, single episode, unspecified: Secondary | ICD-10-CM | POA: Insufficient documentation

## 2017-11-15 DIAGNOSIS — R112 Nausea with vomiting, unspecified: Secondary | ICD-10-CM | POA: Diagnosis not present

## 2017-11-15 DIAGNOSIS — R109 Unspecified abdominal pain: Secondary | ICD-10-CM | POA: Diagnosis present

## 2017-11-15 DIAGNOSIS — Z87891 Personal history of nicotine dependence: Secondary | ICD-10-CM | POA: Diagnosis not present

## 2017-11-15 DIAGNOSIS — G2581 Restless legs syndrome: Secondary | ICD-10-CM | POA: Diagnosis not present

## 2017-11-15 DIAGNOSIS — R102 Pelvic and perineal pain: Secondary | ICD-10-CM | POA: Insufficient documentation

## 2017-11-15 LAB — URINALYSIS, ROUTINE W REFLEX MICROSCOPIC
Bacteria, UA: NONE SEEN
Bilirubin Urine: NEGATIVE
Glucose, UA: NEGATIVE mg/dL
Hgb urine dipstick: NEGATIVE
Ketones, ur: NEGATIVE mg/dL
Leukocytes, UA: NEGATIVE
Nitrite: NEGATIVE
Protein, ur: 30 mg/dL — AB
Specific Gravity, Urine: 1.026 (ref 1.005–1.030)
pH: 5 (ref 5.0–8.0)

## 2017-11-15 LAB — CBC WITH DIFFERENTIAL/PLATELET
Basophils Absolute: 0 10*3/uL (ref 0.0–0.1)
Basophils Relative: 0 %
Eosinophils Absolute: 0.1 10*3/uL (ref 0.0–0.7)
Eosinophils Relative: 1 %
HCT: 34.3 % — ABNORMAL LOW (ref 36.0–46.0)
Hemoglobin: 9.9 g/dL — ABNORMAL LOW (ref 12.0–15.0)
Lymphocytes Relative: 34 %
Lymphs Abs: 2.2 10*3/uL (ref 0.7–4.0)
MCH: 20 pg — ABNORMAL LOW (ref 26.0–34.0)
MCHC: 28.9 g/dL — ABNORMAL LOW (ref 30.0–36.0)
MCV: 69.3 fL — ABNORMAL LOW (ref 78.0–100.0)
Monocytes Absolute: 0.4 10*3/uL (ref 0.1–1.0)
Monocytes Relative: 6 %
Neutro Abs: 3.8 10*3/uL (ref 1.7–7.7)
Neutrophils Relative %: 59 %
Platelets: 332 10*3/uL (ref 150–400)
RBC: 4.95 MIL/uL (ref 3.87–5.11)
RDW: 18.1 % — ABNORMAL HIGH (ref 11.5–15.5)
WBC: 6.4 10*3/uL (ref 4.0–10.5)

## 2017-11-15 LAB — COMPREHENSIVE METABOLIC PANEL
ALT: 15 U/L (ref 14–54)
AST: 19 U/L (ref 15–41)
Albumin: 3.9 g/dL (ref 3.5–5.0)
Alkaline Phosphatase: 90 U/L (ref 38–126)
Anion gap: 8 (ref 5–15)
BUN: 11 mg/dL (ref 6–20)
CO2: 26 mmol/L (ref 22–32)
Calcium: 8.8 mg/dL — ABNORMAL LOW (ref 8.9–10.3)
Chloride: 105 mmol/L (ref 101–111)
Creatinine, Ser: 0.86 mg/dL (ref 0.44–1.00)
GFR calc Af Amer: >60 mL/min (ref >60)
GFR calc non Af Amer: >60 mL/min (ref >60)
Glucose, Bld: 150 mg/dL — ABNORMAL HIGH (ref 65–99)
Potassium: 3.7 mmol/L (ref 3.5–5.1)
Sodium: 139 mmol/L (ref 135–145)
Total Bilirubin: 0.4 mg/dL (ref 0.3–1.2)
Total Protein: 8.1 g/dL (ref 6.5–8.1)

## 2017-11-15 LAB — WET PREP, GENITAL
Clue Cells Wet Prep HPF POC: NONE SEEN
Sperm: NONE SEEN
Trich, Wet Prep: NONE SEEN
Yeast Wet Prep HPF POC: NONE SEEN

## 2017-11-15 LAB — POCT PREGNANCY, URINE: Preg Test, Ur: NEGATIVE

## 2017-11-15 LAB — LIPASE, BLOOD: Lipase: 30 U/L (ref 11–51)

## 2017-11-15 MED ORDER — ONDANSETRON 4 MG PO TBDP: 4.0000 mg | ORAL_TABLET | Freq: Three times a day (TID) | ORAL | 0 refills | Status: DC | PRN

## 2017-11-15 MED ORDER — ONDANSETRON 8 MG PO TBDP
8.0000 mg | ORAL_TABLET | Freq: Once | ORAL | Status: AC
  Administered 2017-11-15: 8 mg via ORAL
  Filled 2017-11-15: qty 1

## 2017-11-15 NOTE — MAU Provider Note (Signed)
History     CSN: [id number]  Arrival date and time: 11/15/17 0900   First Provider Initiated Contact with Patient 11/15/17 [phone]      Chief Complaint  Patient presents with  . Abdominal Pain  . Emesis   HPI   Ms.[name] is 46 y.o. non pregnant female here in MAU with vomiting times 3 days. States the vomiting started all of a sudden. She attests to 4-5 episodes of vomiting per day. She has not taken anything for the nausea. States she has regular periods; however has not had a period this month. States she is concerned she is pregnant.  Has a primary care Dr. However has not notified them of this problem. Also having left lower, suprapubic and epigastric pain. The pain started at the same time as the vomiting.  Tried eating peanut butter crackers last night at midnight and vomited that up. New sexual partner; Started having sex Oct 20. Highly desires to become pregnant.   OB History    Gravida Para Term Preterm AB Living   2 1 1  0 1 1   SAB TAB Ectopic Multiple Live Births   1       1      Past Medical History:  Diagnosis Date  . ADD (attention deficit disorder with hyperactivity)   . Allergic rhinitis   . Anxiety   . Arthritis   . Depression   . Dysmenorrhea   . Fibromyalgia   . GERD (gastroesophageal reflux disease)   . Insomnia   . Irritable bowel syndrome   . Restless leg syndrome     Past Surgical History:  Procedure Laterality Date  . BUNIONECTOMY  2008   left  . DILATION AND CURETTAGE OF UTERUS  2004  . EAR CYST EXCISION  1983   Left    Family History  Problem Relation Age of Onset  . Breast cancer Maternal Grandmother   . Diabetes Sister   . Heart disease Brother   . Stroke Mother   . Colon cancer Neg Hx   . Colon polyps Neg Hx   . Esophageal cancer Neg Hx   . Kidney disease Neg Hx   . Gallbladder disease Neg Hx   . Stomach cancer Neg Hx     Social History   Tobacco Use  . Smoking status: Former Smoker    Packs/day: 0.10   Years: 12.00    Pack years: 1.20    Types: Cigarettes    Last attempt to quit: 12/27/1993    Years since quitting: 23.9  . Smokeless tobacco: Never Used  Substance Use Topics  . Alcohol use: No  . Drug use: No    Allergies: No Known Allergies  Medications Prior to Admission  Medication Sig Dispense Refill Last Dose  . cyclobenzaprine (FLEXERIL) 10 MG tablet TAKE 1 TABLET BY MOUTH AT BEDTIME FOR MUSCLE SPASMS 30 tablet 5   . diphenhydrAMINE (BENADRYL) 25 mg capsule Take 25 mg by mouth every 6 (six) hours as needed for allergies.   Taking  . DULoxetine (CYMBALTA) 30 MG capsule Take 90 mg by mouth at bedtime.   1 Taking  . gabapentin (NEURONTIN) 300 MG capsule Take 600 mg by mouth at bedtime.    Taking  . omeprazole (PRILOSEC) 40 MG capsule Take 1 capsule (40 mg total) by mouth daily. 30 capsule 3 Taking  . traMADol (ULTRAM) 50 MG tablet Take 2 tablets (100 mg total) by mouth 2 (two) times daily. For pain (Patient taking  differently: Take 100 mg by mouth at bedtime. For pain) 120 tablet 3 Taking  . Vitamin D, Ergocalciferol, (DRISDOL) 50000 units CAPS capsule Take 1 capsule (50,000 Units total) by mouth 2 (two) times a week. 24 capsule 0   . VOLTAREN 1 % GEL 3 grams tid (Patient taking differently: Apply 3 g topically daily as needed (for pain). ) 3 Tube 3 Taking  . zolpidem (AMBIEN) 10 MG tablet Take 1 tablet (10 mg total) by mouth at bedtime as needed for sleep. 30 tablet 5 Taking   Results for orders placed or performed during the hospital encounter of 11/15/17 (from the past 48 hour(s))  Urinalysis, Routine w reflex microscopic     Status: Abnormal   Collection Time: 11/15/17  9:20 AM  Result Value Ref Range   Color, Urine YELLOW YELLOW   APPearance HAZY (A) CLEAR   Specific Gravity, Urine 1.026 1.005 - 1.030   pH 5.0 5.0 - 8.0   Glucose, UA NEGATIVE NEGATIVE mg/dL   Hgb urine dipstick NEGATIVE NEGATIVE   Bilirubin Urine NEGATIVE NEGATIVE   Ketones, ur NEGATIVE NEGATIVE mg/dL    Protein, ur 30 (A) NEGATIVE mg/dL   Nitrite NEGATIVE NEGATIVE   Leukocytes, UA NEGATIVE NEGATIVE   RBC / HPF 0-5 0 - 5 RBC/hpf   WBC, UA 0-5 0 - 5 WBC/hpf   Bacteria, UA NONE SEEN NONE SEEN   Squamous Epithelial / LPF 0-5 (A) NONE SEEN   Mucus PRESENT   Pregnancy, urine POC     Status: None   Collection Time: 11/15/17  9:42 AM  Result Value Ref Range   Preg Test, Ur NEGATIVE NEGATIVE    Comment:        THE SENSITIVITY OF THIS METHODOLOGY IS >24 mIU/mL   CBC with Differential     Status: Abnormal   Collection Time: 11/15/17  9:56 AM  Result Value Ref Range   WBC 6.4 4.0 - 10.5 K/uL   RBC 4.95 3.87 - 5.11 MIL/uL   Hemoglobin 9.9 (L) 12.0 - 15.0 g/dL   HCT 34.3 (L) 36.0 - 46.0 %   MCV 69.3 (L) 78.0 - 100.0 fL   MCH 20.0 (L) 26.0 - 34.0 pg   MCHC 28.9 (L) 30.0 - 36.0 g/dL   RDW 18.1 (H) 11.5 - 15.5 %   Platelets 332 150 - 400 K/uL   Neutrophils Relative % 59 %   Neutro Abs 3.8 1.7 - 7.7 K/uL   Lymphocytes Relative 34 %   Lymphs Abs 2.2 0.7 - 4.0 K/uL   Monocytes Relative 6 %   Monocytes Absolute 0.4 0.1 - 1.0 K/uL   Eosinophils Relative 1 %   Eosinophils Absolute 0.1 0.0 - 0.7 K/uL   Basophils Relative 0 %   Basophils Absolute 0.0 0.0 - 0.1 K/uL  Comprehensive metabolic panel     Status: Abnormal   Collection Time: 11/15/17  9:56 AM  Result Value Ref Range   Sodium 139 135 - 145 mmol/L   Potassium 3.7 3.5 - 5.1 mmol/L   Chloride 105 101 - 111 mmol/L   CO2 26 22 - 32 mmol/L   Glucose, Bld 150 (H) 65 - 99 mg/dL   BUN 11 6 - 20 mg/dL   Creatinine, Ser 0.86 0.44 - 1.00 mg/dL   Calcium 8.8 (L) 8.9 - 10.3 mg/dL   Total Protein 8.1 6.5 - 8.1 g/dL   Albumin 3.9 3.5 - 5.0 g/dL   AST 19 15 - 41 U/L   ALT 15  14 - 54 U/L   Alkaline Phosphatase 90 38 - 126 U/L   Total Bilirubin 0.4 0.3 - 1.2 mg/dL   GFR calc non Af Amer >60 >60 mL/min   GFR calc Af Amer >60 >60 mL/min    Comment: (NOTE) The eGFR has been calculated using the CKD EPI equation. This calculation has not been  validated in all clinical situations. eGFR's persistently <60 mL/min signify possible Chronic Kidney Disease.    Anion gap 8 5 - 15  Lipase, blood     Status: None   Collection Time: 11/15/17  9:56 AM  Result Value Ref Range   Lipase 30 11 - 51 U/L  Wet prep, genital     Status: Abnormal   Collection Time: 11/15/17 11:16 AM  Result Value Ref Range   Yeast Wet Prep HPF POC NONE SEEN NONE SEEN   Trich, Wet Prep NONE SEEN NONE SEEN   Clue Cells Wet Prep HPF POC NONE SEEN NONE SEEN   WBC, Wet Prep HPF POC FEW (A) NONE SEEN    Comment: MANY BACTERIA SEEN   Sperm NONE SEEN     Review of Systems  Constitutional: Negative for fever.  Gastrointestinal: Positive for abdominal pain, nausea and vomiting. Negative for constipation and diarrhea.  Genitourinary: Negative for dysuria, vaginal bleeding and vaginal discharge.   Physical Exam   Blood pressure (!) 145/94, pulse (!) 102, temperature 97.9 F (36.6 C), temperature source Oral, resp. rate 19, height 5' 5"  (1.651 m), weight 225 lb 8 oz (102.3 kg), last menstrual period 09/23/2017, SpO2 99 %.  Physical Exam  Constitutional: She is oriented to person, place, and time. She appears well-developed and well-nourished. No distress.  HENT:  Head: Normocephalic.  Eyes: Pupils are equal, round, and reactive to light.  GI: Soft. Normal appearance and bowel sounds are normal. There is generalized tenderness. There is no rigidity, no rebound and no guarding.  Musculoskeletal: Normal range of motion.  Neurological: She is alert and oriented to person, place, and time.  Skin: Skin is warm. She is not diaphoretic.  Psychiatric: Her behavior is normal.   MAU Course  Procedures  None  MDM  Zofran given 8 mg ODT Patient states the nausea has subsided.  Labs WNL Patient appears in no distress, minimal tenderness on exam without rebound. No fever or elevated WBC. Stable to for DC home with follow up from PCP   Assessment and Plan   A:  1.  Nausea and vomiting, intractability of vomiting not specified, unspecified vomiting type   2. Pelvic pain in female   3. Encounter for pregnancy test, result negative   4. Elevated BP without diagnosis of hypertension     P:  Discharge home with strict return precautions If pain/vomiting worsens go to [facility] ED Return to MAU for GYN emergencies only Rx: Zofran Follow up with PCP Discussed risk of pregnancy with chronic conditions and maternal age.  Pregnancy tests at home encouraged.    Lezlie Lye, NP 11/15/2017 5:28 PM

## 2017-11-15 NOTE — Discharge Instructions (Signed)
Acute Pain, Adult  Acute pain is a type of pain that may last for just a few days or as long as six months. It is often related to an illness, injury, or medical procedure. Acute pain may be mild, moderate, or severe. It usually goes away once your injury has healed or you are no longer ill.  Pain can make it hard for you to do daily activities. It can cause anxiety and lead to other problems if left untreated. Treatment depends on the cause and severity of your acute pain.  Follow these instructions at home:   Check your pain level as told by your health care provider.   Take over-the-counter and prescription medicines only as told by your health care provider.   If you are taking prescription pain medicine:  ? Ask your health care provider about taking a stool softener or laxative to prevent constipation.  ? Do not stop taking the medicine suddenly. Talk to your health care provider about how and when to discontinue prescription pain medicine.  ? If your pain is severe, do not take more pills than instructed by your health care provider.  ? Do not take other over-the-counter pain medicines in addition to this medicine unless told by your health care provider.  ? Do not drive or operate heavy machinery while taking prescription pain medicine.   Apply ice or heat as told by your health care provider. These may reduce swelling and pain.   Ask your health care provider if other strategies such as distraction, relaxation, or physical therapies can help your pain.   Keep all follow-up visits as told by your health care provider. This is important.  Contact a health care provider if:   You have pain that is not controlled by medicine.   Your pain does not improve or gets worse.   You have side effects from pain medicines, such as vomitingor confusion.  Get help right away if:   You have severe pain.   You have trouble breathing.   You lose consciousness.   You have chest pain or pressure that lasts for more  than a few minutes. Along with the chest pain you may:  ? Have pain or discomfort in one or both arms, your back, neck, jaw, or stomach.  ? Have shortness of breath.  ? Break out in a cold sweat.  ? Feel nauseous.  ? Become light-headed.  These symptoms may represent a serious problem that is an emergency. Do not wait to see if the symptoms will go away. Get medical help right away. Call your local emergency services (911 in the [place] Do not drive yourself to the hospital.  This information is not intended to replace advice given to you by your health care provider. Make sure you discuss any questions you have with your health care provider.  Document Released: 12/18/2015 Document Revised: 05/11/2016 Document Reviewed: 12/18/2015  Elsevier Interactive Patient Education  2018 [employer]

## 2017-11-15 NOTE — MAU Note (Signed)
Pt present to [facility] c/o GI symptoms  for 3 days, last held food down at midnight, and increased abdominal pain today.  Pt also states "my cycle is late".

## 2017-11-15 NOTE — MAU Note (Signed)
Pt presents with c/o lower abdominal pain that began around midnight.  Denies VB.  LMP10/8/18.  Pt also reports vomiting x3 days, able to keep somethings down.

## 2017-11-16 LAB — HIV ANTIBODY (ROUTINE TESTING W REFLEX): HIV Screen 4th Generation wRfx: NONREACTIVE

## 2017-11-16 LAB — RPR: RPR Ser Ql: NONREACTIVE

## 2017-11-18 ENCOUNTER — Ambulatory Visit (INDEPENDENT_AMBULATORY_CARE_PROVIDER_SITE_OTHER): Payer: PPO | Admitting: Orthopaedic Surgery

## 2017-11-18 ENCOUNTER — Ambulatory Visit (INDEPENDENT_AMBULATORY_CARE_PROVIDER_SITE_OTHER): Payer: Self-pay

## 2017-11-18 ENCOUNTER — Encounter (INDEPENDENT_AMBULATORY_CARE_PROVIDER_SITE_OTHER): Payer: Self-pay | Admitting: Orthopaedic Surgery

## 2017-11-18 VITALS — BP 129/74 | HR 112 | Resp 14 | Ht 63.0 in | Wt 245.0 lb

## 2017-11-18 DIAGNOSIS — M25561 Pain in right knee: Secondary | ICD-10-CM | POA: Diagnosis not present

## 2017-11-18 DIAGNOSIS — G8929 Other chronic pain: Secondary | ICD-10-CM | POA: Diagnosis not present

## 2017-11-18 LAB — GC/CHLAMYDIA PROBE AMP (~~LOC~~) NOT AT ARMC
Chlamydia: NEGATIVE
Neisseria Gonorrhea: NEGATIVE

## 2017-11-18 MED ORDER — BUPIVACAINE HCL 0.5 % IJ SOLN
2.0000 mL | INTRAMUSCULAR | Status: AC | PRN
  Administered 2017-11-18: 2 mL via INTRA_ARTICULAR

## 2017-11-18 MED ORDER — METHYLPREDNISOLONE ACETATE 40 MG/ML IJ SUSP
80.0000 mg | INTRAMUSCULAR | Status: AC | PRN
  Administered 2017-11-18: 80 mg

## 2017-11-18 MED ORDER — LIDOCAINE HCL 1 % IJ SOLN
2.0000 mL | INTRAMUSCULAR | Status: AC | PRN
  Administered 2017-11-18: 2 mL

## 2017-11-18 NOTE — Progress Notes (Signed)
Office Visit Note   Patient: [name]           Date of Birth: 1971/02/13           MRN: [id number] Visit Date: 11/18/2017              Requested by: Donald Prose, MD [address] [place], [place] [address] PCP: Donald Prose, MD   Assessment & Plan: Visit Diagnoses:  1. Chronic pain of right knee     Plan: Three-week history of right knee pain without injury or trauma. Films demonstrate mild to moderate degenerative changes. Will inject the joint with Depo-Medrol and monitor her response.  Follow-Up Instructions: Return if symptoms worsen or fail to improve.   Orders:  Orders Placed This Encounter  Procedures  . XR KNEE 3 VIEW RIGHT   No orders of the defined types were placed in this encounter.     Procedures: Large Joint Inj: R knee on 11/18/2017 11:04 AM Indications: pain and diagnostic evaluation Details: 25 G 1.5 in needle, anteromedial approach  Arthrogram: No  Medications: 2 mL lidocaine 1 %; 2 mL bupivacaine 0.5 %; 80 mg methylPREDNISolone acetate 40 MG/ML Procedure, treatment alternatives, risks and benefits explained, specific risks discussed. Consent was given by the patient. Immediately prior to procedure a time out was called to verify the correct patient, procedure, equipment, support staff and site/side marked as required. Patient was prepped and draped in the usual sterile fashion.       Clinical Data: No additional findings.   Subjective: Chief Complaint  Patient presents with  . Right Knee - Routine Post Op, Edema, Pain, Numbness, Fracture    [name] is a 46 y o S/P 10 months Right knee arthroscopy. She relates she has pain, swelling and sometimes knee locks.  Posterior pain that "shoots" down her R knee.  10 months status post arthroscopic lateral release right knee with an open medial patellofemoral ligament plication. Did very well until about 3 weeks ago with an insidious onset of right knee pain with  mild swelling. Has also experienced some popliteal pain and on occasion referred pain to the calf. No fever or chills shortness of breath or chest pain.  HPI  Review of Systems  Constitutional: Negative for chills, fatigue and fever.  Eyes: Negative for itching.  Respiratory: Negative for chest tightness and shortness of breath.   Cardiovascular: Negative for chest pain, palpitations and leg swelling.  Gastrointestinal: Negative for blood in stool, constipation and diarrhea.  Endocrine: Negative for polyuria.  Genitourinary: Negative for dysuria.  Musculoskeletal: Negative for back pain, joint swelling, neck pain and neck stiffness.  Allergic/Immunologic: Negative for immunocompromised state.  Neurological: Negative for dizziness and numbness.  Hematological: Does not bruise/bleed easily.  Psychiatric/Behavioral: The patient is not nervous/anxious.      Objective: Vital Signs: BP 129/74   Pulse (!) 112   Resp 14   Ht 5\' 3"  (1.6 m)   Wt 245 lb (111.1 kg)   BMI 43.40 kg/m   Physical Exam  Ortho Exam awake alert and oriented 3. Comfortable sitting. Minimal effusion right knee. Predominantly medial joint pain with some mild patella crepitation. No pain laterally. Fullness in the popliteal space could be a popliteal cyst. No calf pain. No distal edema. Neurovascular exam intact. Straight leg raise negative. Painless range of motion both hips  Specialty Comments:  No specialty comments available.  Imaging: Xr Knee 3 View Right  Result Date: 11/18/2017 Films of the  right knee replacement in 3 projections standing. There is mild decrease in the medial joint space mild degenerative changes about the patellofemoral joint with peripheral osteophytes. No ectopic calcification no evidence of fracture. Films consistent where a with mild to moderate osteoarthritis and at least the medial and patellofemoral compartments    PMFS History: Patient Active Problem List   Diagnosis Date Noted   . Fibromyalgia 12/25/2016  . Other fatigue 12/25/2016  . Primary insomnia 12/25/2016  . Primary osteoarthritis of both knees 12/25/2016  . ANA positive 12/25/2016  . History of gastroesophageal reflux (GERD) 12/25/2016  . History of anemia 12/25/2016  . Trochanteric bursitis of both hips 12/25/2016  . Anemia, iron deficiency 04/18/2015  . Dysphagia, pharyngoesophageal phase 11/14/2011  . Esophageal reflux 11/14/2011   Past Medical History:  Diagnosis Date  . ADD (attention deficit disorder with hyperactivity)   . Allergic rhinitis   . Anxiety   . Arthritis   . Depression   . Dysmenorrhea   . Fibromyalgia   . GERD (gastroesophageal reflux disease)   . Insomnia   . Irritable bowel syndrome   . Restless leg syndrome     Family History  Problem Relation Age of Onset  . Breast cancer Maternal Grandmother   . Diabetes Sister   . Heart disease Brother   . Stroke Mother   . Colon cancer Neg Hx   . Colon polyps Neg Hx   . Esophageal cancer Neg Hx   . Kidney disease Neg Hx   . Gallbladder disease Neg Hx   . Stomach cancer Neg Hx     Past Surgical History:  Procedure Laterality Date  . BUNIONECTOMY  2008   left  . DILATION AND CURETTAGE OF UTERUS  2004  . EAR CYST EXCISION  1983   Left   Social History   Occupational History  . Occupation: Disabled  Tobacco Use  . Smoking status: Former Smoker    Packs/day: 0.10    Years: 12.00    Pack years: 1.20    Types: Cigarettes    Last attempt to quit: 12/27/1993    Years since quitting: 23.9  . Smokeless tobacco: Never Used  Substance and Sexual Activity  . Alcohol use: No  . Drug use: No  . Sexual activity: Yes

## 2017-11-29 ENCOUNTER — Other Ambulatory Visit: Payer: Self-pay

## 2017-11-29 MED ORDER — TRAMADOL HCL 50 MG PO TABS: 100.0000 mg | ORAL_TABLET | Freq: Every day | ORAL | 2 refills | Status: DC

## 2017-11-29 NOTE — Telephone Encounter (Signed)
ok 

## 2017-11-29 NOTE — Telephone Encounter (Signed)
Last Visit: 06/25/17 Next visit: 01/02/18 UDS: 06/25/17 Narc Agreement: 07/23/17  Okay to refill Tramadol?

## 2017-11-29 NOTE — Addendum Note (Signed)
Addended by: Carole Binning on: 11/29/2017 04:42 PM   Modules accepted: Orders

## 2017-11-29 NOTE — Telephone Encounter (Signed)
Patient would like a Rx refill for Tramadol.  Cb# is [id number].  Please advise.  Thank you.

## 2017-12-20 NOTE — Progress Notes (Signed)
Office Visit Note  Patient: [name]             Date of Birth: [birth year]           MRN: [id number]             PCP: Donald Prose, MD Referring: Donald Prose, MD Visit Date: 01/02/2018 Occupation: @GUAROCC @    Subjective:  Generalized pain    History of Present Illness: [name] is a 47 y.o. female with history of fibromyalgia and osteoarthritis of bilateral knees.  She states that she has been having a fibromyalgia flare since September. She states her generalized pain is worse with weather changes.  She has also had increased stress since moving in with her niece. She has had increased fatigue and insomnia.  She takes Ambien 10 mg at bedtime every night.  She is also on Flexeril, Cymbalta, Gabapentin, and tramadol.  She states that she has tried water aerobics for about 1 year and it did not help.  She tries to meditate on a regular basis.  She continues to have trapezius muscle tension and tenderness.  She does not want a cortisone injection due to it not helping in the past.  She reports point tenderness of bilateral trochanteric bursitis.  She states her right knee occasionally causes discomfort and swelling.  She elevates her knee, which helps resolve the swelling. She states her carpal tunnel symptoms have returned.  She previously had a nerve conduction test that was negative.    She reports a rash on bilateral arms, ear sores, and nasal sores.      Activities of Daily Living:  Patient reports morning stiffness for 1 hour.   Patient Reports nocturnal pain.  Difficulty dressing/grooming: Denies Difficulty climbing stairs: Reports Difficulty getting out of chair: Denies Difficulty using hands for taps, buttons, cutlery, and/or writing: Reports   Review of Systems  Constitutional: Positive for fatigue and weakness. Negative for night sweats.  HENT: Positive for ear pain (sores ) and nose dryness (Nasal sores). Negative for mouth dryness.   Eyes:  Negative for redness, visual disturbance and dryness.  Respiratory: Positive for shortness of breath. Negative for cough.   Cardiovascular: Negative for chest pain, palpitations, hypertension and swelling in legs/feet.  Gastrointestinal: Negative for blood in stool, constipation and diarrhea.  Endocrine: Negative for increased urination.  Genitourinary: Negative for painful urination.  Musculoskeletal: Positive for arthralgias, joint pain and morning stiffness. Negative for gait problem, joint swelling, myalgias, muscle weakness, muscle tenderness and myalgias.  Skin: Positive for rash. Negative for color change, pallor, hair loss, nodules/bumps, redness, skin tightness, ulcers and sensitivity to sunlight.  Neurological: Positive for dizziness. Negative for numbness, headaches, memory loss and night sweats.  Hematological: Positive for bruising/bleeding tendency.  Psychiatric/Behavioral: Negative for depressed mood, hallucinations and sleep disturbance. The patient is nervous/anxious.     PMFS History:  Patient Active Problem List   Diagnosis Date Noted  . Fibromyalgia 12/25/2016  . Other fatigue 12/25/2016  . Primary insomnia 12/25/2016  . Primary osteoarthritis of both knees 12/25/2016  . ANA positive 12/25/2016  . History of gastroesophageal reflux (GERD) 12/25/2016  . History of anemia 12/25/2016  . Trochanteric bursitis of both hips 12/25/2016  . Anemia, iron deficiency 04/18/2015  . Dysphagia, pharyngoesophageal phase 11/14/2011  . Esophageal reflux 11/14/2011    Past Medical History:  Diagnosis Date  . ADD (attention deficit disorder with hyperactivity)   . Allergic rhinitis   . Anxiety   . Arthritis   .  Depression   . Dysmenorrhea   . Fibromyalgia   . GERD (gastroesophageal reflux disease)   . Insomnia   . Irritable bowel syndrome   . Restless leg syndrome     Family History  Problem Relation Age of Onset  . Breast cancer Maternal Grandmother   . Diabetes Sister     . Heart disease Brother   . Stroke Mother   . Colon cancer Neg Hx   . Colon polyps Neg Hx   . Esophageal cancer Neg Hx   . Kidney disease Neg Hx   . Gallbladder disease Neg Hx   . Stomach cancer Neg Hx    Past Surgical History:  Procedure Laterality Date  . BUNIONECTOMY  2008   left  . DILATION AND CURETTAGE OF UTERUS  2004  . EAR CYST EXCISION  1983   Left   Social History   Social History Narrative  . Not on file     Objective: Vital Signs: BP 125/90 (BP Location: Left Arm, Patient Position: Sitting, Cuff Size: Normal)   Pulse (!) 106   Resp 18   Ht 5\' 5"  (1.651 m)   Wt 236 lb (107 kg)   BMI 39.27 kg/m    Physical Exam  Constitutional: She is oriented to person, place, and time. She appears well-developed and well-nourished.  HENT:  Head: Normocephalic and atraumatic.  Eyes: Conjunctivae and EOM are normal.  Neck: Normal range of motion.  Cardiovascular: Normal rate, regular rhythm, normal heart sounds and intact distal pulses.  Pulmonary/Chest: Effort normal and breath sounds normal.  Abdominal: Soft. Bowel sounds are normal.  Lymphadenopathy:    She has no cervical adenopathy.  Neurological: She is alert and oriented to person, place, and time.  Skin: Skin is warm and dry. Capillary refill takes less than 2 seconds.  Psychiatric: She has a normal mood and affect. Her behavior is normal.  Nursing note and vitals reviewed.    Musculoskeletal Exam: C-spine limited ROM.  Thoracic and lumbar spine good ROM.  Shoulder joints, elbow joints, wrist joints, MCPs, PIPs, and DIPs good ROM with no synovitis.  Hip joints, knee joints, ankle joints, MTPs, PIPs, and DIPs good ROM with no synovitis. Right knee crepitus.  No warmth or effusion. Trochanteric bursitis bilaterally.  Generalized hyperalgesia on exam.  She has muscle tension and tenderness in the trapezius region.    Investigation: No additional findings.UDS: 06/25/2017 Narc agreement: 08/02/2017 CBC Latest Ref  Rng & Units 11/15/2017 05/20/2017 04/18/2015  WBC 4.0 - 10.5 K/uL 6.4 9.0 6.9  Hemoglobin 12.0 - 15.0 g/dL 9.9(L) 10.1(L) 8.9 Repeated and verified X2.(L)  Hematocrit 36.0 - 46.0 % 34.3(L) 33.5(L) 29.1(L)  Platelets 150 - 400 K/uL 332 280 386.0   CMP Latest Ref Rng & Units 11/15/2017 05/20/2017 02/01/2013  Glucose 65 - 99 mg/dL 150(H) 142(H) 141(H)  BUN 6 - 20 mg/dL 11 11 8   Creatinine 0.44 - 1.00 mg/dL 0.86 0.88 0.77  Sodium 135 - 145 mmol/L 139 136 137  Potassium 3.5 - 5.1 mmol/L 3.7 3.5 3.5  Chloride 101 - 111 mmol/L 105 102 100  CO2 22 - 32 mmol/L 26 25 23   Calcium 8.9 - 10.3 mg/dL 8.8(L) 8.5(L) 8.9  Total Protein 6.5 - 8.1 g/dL 8.1 7.7 8.1  Total Bilirubin 0.3 - 1.2 mg/dL 0.4 0.3 0.4  Alkaline Phos 38 - 126 U/L 90 92 91  AST 15 - 41 U/L 19 20 12   ALT 14 - 54 U/L 15 18 11  Imaging: No results found.  Speciality Comments: No specialty comments available.    Procedures:  No procedures performed Allergies: Patient has no known allergies.   Assessment / Plan:     Visit Diagnoses: Fibromyalgia: She has been flaring since September 2018.  She is on Flexeril, Ambien, Gabapentin, Cymbalta, and Tramadol.  UDS and narcotic agreement are up to date.  A refill for Ambien as sent to the pharmacy.  Encouraged her to try exercising regularly.    Other fatigue: Chronic and worsening due to insomnia.    Primary insomnia: Worsening.  She is on Ambien 10 mg at bedtime. A refill was sent to the pharmacy today.    Primary osteoarthritis of both knees: She has crepitus of right knee.  No warmth or effusion.   ANA positive - 1:80 centromere pattern with no clinical features of autoimmune disease  Trochanteric bursitis of both hips: Tenderness bilaterally.  She performs exercises at home on a regular basis.    History of vitamin D deficiency -She has a history of vitamin D deficiency.  She has not been on supplements recently.  A vitamin D level was drawn today to monitor level. Plan: VITAMIN D  25 Hydroxy (Vit-D Deficiency, Fractures)  Medication monitoring encounter - tramadol-UDS: 06/25/2017, Narc agreement: 08/02/2017  Intranasal ulcers -she has been having more frequent nasal ulcers and sores in her ear canals.  She also reports an intermittent rash on the anterior surface of her forearms.  No sores were present today on exam. A referral to dermatology was made. Plan: Ambulatory referral to Dermatology   History of gastroesophageal reflux (GERD)  History of anemia     Orders: Orders Placed This Encounter  Procedures  . VITAMIN D 25 Hydroxy (Vit-D Deficiency, Fractures)  . Ambulatory referral to Dermatology   Meds ordered this encounter  Medications  . zolpidem (AMBIEN) 10 MG tablet    Sig: Take 1 tablet (10 mg total) by mouth at bedtime as needed for sleep.    Dispense:  30 tablet    Refill:  2    Face-to-face time spent with patient was 30 minutes. Greater than 50% of time was spent in counseling and coordination of care.  Follow-Up Instructions: Return in about 6 months (around 07/02/2018) for Fibromyalgia, Osteoarthritis.   Bo Merino, MD  Note - This record has been created using Editor, commissioning.  Chart creation errors have been sought, but may not always  have been located. Such creation errors do not reflect on  the standard of medical care.

## 2018-01-02 ENCOUNTER — Ambulatory Visit: Payer: PPO | Admitting: Rheumatology

## 2018-01-02 ENCOUNTER — Encounter: Payer: Self-pay | Admitting: Rheumatology

## 2018-01-02 VITALS — BP 125/90 | HR 106 | Resp 18 | Ht 65.0 in | Wt 236.0 lb

## 2018-01-02 DIAGNOSIS — Z8719 Personal history of other diseases of the digestive system: Secondary | ICD-10-CM

## 2018-01-02 DIAGNOSIS — Z8639 Personal history of other endocrine, nutritional and metabolic disease: Secondary | ICD-10-CM

## 2018-01-02 DIAGNOSIS — Z862 Personal history of diseases of the blood and blood-forming organs and certain disorders involving the immune mechanism: Secondary | ICD-10-CM

## 2018-01-02 DIAGNOSIS — M170 Bilateral primary osteoarthritis of knee: Secondary | ICD-10-CM | POA: Diagnosis not present

## 2018-01-02 DIAGNOSIS — F5101 Primary insomnia: Secondary | ICD-10-CM

## 2018-01-02 DIAGNOSIS — Z5181 Encounter for therapeutic drug level monitoring: Secondary | ICD-10-CM

## 2018-01-02 DIAGNOSIS — M7061 Trochanteric bursitis, right hip: Secondary | ICD-10-CM | POA: Diagnosis not present

## 2018-01-02 DIAGNOSIS — J340 Abscess, furuncle and carbuncle of nose: Secondary | ICD-10-CM | POA: Diagnosis not present

## 2018-01-02 DIAGNOSIS — R5383 Other fatigue: Secondary | ICD-10-CM

## 2018-01-02 DIAGNOSIS — R768 Other specified abnormal immunological findings in serum: Secondary | ICD-10-CM

## 2018-01-02 DIAGNOSIS — M7062 Trochanteric bursitis, left hip: Secondary | ICD-10-CM | POA: Diagnosis not present

## 2018-01-02 DIAGNOSIS — M797 Fibromyalgia: Secondary | ICD-10-CM

## 2018-01-02 MED ORDER — ZOLPIDEM TARTRATE 10 MG PO TABS: 10.0000 mg | ORAL_TABLET | Freq: Every evening | ORAL | 2 refills | Status: DC | PRN

## 2018-01-03 LAB — VITAMIN D 25 HYDROXY (VIT D DEFICIENCY, FRACTURES): Vit D, 25-Hydroxy: 24 ng/mL — ABNORMAL LOW (ref 30–100)

## 2018-01-03 NOTE — Progress Notes (Signed)
Her Vitamin D is low.  Please advise her to take Vitamin D 50,000 units once a week.  We will recheck vitamin D levels in 3 months.

## 2018-01-06 ENCOUNTER — Telehealth: Payer: Self-pay | Admitting: *Deleted

## 2018-01-06 DIAGNOSIS — E559 Vitamin D deficiency, unspecified: Secondary | ICD-10-CM

## 2018-01-06 MED ORDER — VITAMIN D (ERGOCALCIFEROL) 1.25 MG (50000 UNIT) PO CAPS: 50000.0000 [IU] | ORAL_CAPSULE | ORAL | 0 refills | Status: DC

## 2018-01-06 NOTE — Telephone Encounter (Signed)
-----   Message from Ofilia Neas, PA-C sent at 01/03/2018  8:08 AM EST ----- Her Vitamin D is low.  Please advise her to take Vitamin D 50,000 units once a week.  We will recheck vitamin D levels in 3 months.

## 2018-01-10 DIAGNOSIS — N6324 Unspecified lump in the left breast, lower inner quadrant: Secondary | ICD-10-CM | POA: Diagnosis not present

## 2018-01-10 DIAGNOSIS — N6489 Other specified disorders of breast: Secondary | ICD-10-CM | POA: Diagnosis not present

## 2018-01-10 DIAGNOSIS — Z1231 Encounter for screening mammogram for malignant neoplasm of breast: Secondary | ICD-10-CM | POA: Diagnosis not present

## 2018-01-22 DIAGNOSIS — Z1231 Encounter for screening mammogram for malignant neoplasm of breast: Secondary | ICD-10-CM | POA: Diagnosis not present

## 2018-01-22 DIAGNOSIS — N6489 Other specified disorders of breast: Secondary | ICD-10-CM | POA: Diagnosis not present

## 2018-01-22 DIAGNOSIS — N6324 Unspecified lump in the left breast, lower inner quadrant: Secondary | ICD-10-CM | POA: Diagnosis not present

## 2018-01-27 ENCOUNTER — Other Ambulatory Visit: Payer: Self-pay | Admitting: Gastroenterology

## 2018-01-27 NOTE — Telephone Encounter (Signed)
Refill request for omeprazole 40 mg one po daily.  Last seen 04-2017.

## 2018-01-29 DIAGNOSIS — W19XXXA Unspecified fall, initial encounter: Secondary | ICD-10-CM | POA: Diagnosis not present

## 2018-01-29 DIAGNOSIS — S20219A Contusion of unspecified front wall of thorax, initial encounter: Secondary | ICD-10-CM | POA: Diagnosis not present

## 2018-02-13 ENCOUNTER — Telehealth: Payer: Self-pay | Admitting: Rheumatology

## 2018-02-13 DIAGNOSIS — L700 Acne vulgaris: Secondary | ICD-10-CM | POA: Diagnosis not present

## 2018-02-13 MED ORDER — CYCLOBENZAPRINE HCL 10 MG PO TABS: ORAL_TABLET | ORAL | 1 refills | Status: DC

## 2018-02-13 NOTE — Telephone Encounter (Signed)
Patient request a refill on Flexeril sent to [facility] on [address].

## 2018-02-13 NOTE — Telephone Encounter (Signed)
Last visit: 01/02/2018 Next visit: message sent to the front desk to schedule patient.   Last fill:  07/15/2017 with 5 refills   Okay to refill flexeril?

## 2018-02-13 NOTE — Telephone Encounter (Signed)
ok 

## 2018-02-28 ENCOUNTER — Other Ambulatory Visit: Payer: Self-pay | Admitting: Rheumatology

## 2018-03-03 ENCOUNTER — Other Ambulatory Visit: Payer: Self-pay | Admitting: *Deleted

## 2018-03-03 ENCOUNTER — Other Ambulatory Visit: Payer: Self-pay

## 2018-03-03 DIAGNOSIS — Z5181 Encounter for therapeutic drug level monitoring: Secondary | ICD-10-CM

## 2018-03-03 NOTE — Telephone Encounter (Signed)
Last Visit: 01/02/18 Next Visit: 07/01/18 UDS: 06/25/17 Narc Agreement: 07/23/17  Patient is coming 03/03/18 to update UDS.  Okay to refill Tramadol?

## 2018-03-03 NOTE — Telephone Encounter (Signed)
Patient came in office for UDS

## 2018-03-03 NOTE — Telephone Encounter (Signed)
ok to refill after UDS obtained.

## 2018-03-06 LAB — PAIN MGMT, PROFILE 5 W/CONF, U
Alphahydroxyalprazolam: NEGATIVE ng/mL (ref <25)
Alphahydroxymidazolam: NEGATIVE ng/mL (ref <50)
Alphahydroxytriazolam: NEGATIVE ng/mL (ref <50)
Aminoclonazepam: NEGATIVE ng/mL (ref <25)
Amphetamines: NEGATIVE ng/mL (ref <500)
Barbiturates: NEGATIVE ng/mL (ref <300)
Benzodiazepines: NEGATIVE ng/mL (ref <100)
Cocaine Metabolite: NEGATIVE ng/mL (ref <150)
Creatinine: 248.8 mg/dL
Hydroxyethylflurazepam: NEGATIVE ng/mL (ref <50)
Lorazepam: NEGATIVE ng/mL (ref <50)
Marijuana Metabolite: NEGATIVE ng/mL (ref <20)
Methadone Metabolite: NEGATIVE ng/mL (ref <100)
Nordiazepam: NEGATIVE ng/mL (ref <50)
Opiates: NEGATIVE ng/mL (ref <100)
Oxazepam: NEGATIVE ng/mL (ref <50)
Oxidant: NEGATIVE ug/mL (ref <200)
Oxycodone: NEGATIVE ng/mL (ref <100)
Temazepam: NEGATIVE ng/mL (ref <50)
pH: 6.66 (ref 4.5–9.0)

## 2018-03-06 LAB — PAIN MGMT, TRAMADOL W/MEDMATCH, U
Desmethyltramadol: 2923 ng/mL — ABNORMAL HIGH (ref <100)
Tramadol: 11412 ng/mL — ABNORMAL HIGH (ref <100)

## 2018-03-26 DIAGNOSIS — M797 Fibromyalgia: Secondary | ICD-10-CM | POA: Diagnosis not present

## 2018-03-26 DIAGNOSIS — A6000 Herpesviral infection of urogenital system, unspecified: Secondary | ICD-10-CM | POA: Diagnosis not present

## 2018-03-26 DIAGNOSIS — R7303 Prediabetes: Secondary | ICD-10-CM | POA: Diagnosis not present

## 2018-03-26 DIAGNOSIS — Z136 Encounter for screening for cardiovascular disorders: Secondary | ICD-10-CM | POA: Diagnosis not present

## 2018-03-26 DIAGNOSIS — F411 Generalized anxiety disorder: Secondary | ICD-10-CM | POA: Diagnosis not present

## 2018-04-10 ENCOUNTER — Other Ambulatory Visit: Payer: Self-pay | Admitting: Rheumatology

## 2018-04-11 NOTE — Telephone Encounter (Signed)
Last Visit: 01/02/18 Next Visit: 07/01/18 UDS: 03/03/18 Narc Agreement: 07/23/17  Okay to refill Tramadol?

## 2018-04-13 ENCOUNTER — Other Ambulatory Visit: Payer: Self-pay | Admitting: Rheumatology

## 2018-04-14 NOTE — Telephone Encounter (Signed)
Last Visit: 01/02/18 Next Visit: 07/01/18  Okay to refill per Dr. Estanislado Pandy

## 2018-04-20 ENCOUNTER — Other Ambulatory Visit: Payer: Self-pay | Admitting: Rheumatology

## 2018-04-21 NOTE — Telephone Encounter (Signed)
Last Visit: 01/02/18 Next Visit: 07/01/18  Okay to refill Ambien?

## 2018-05-17 ENCOUNTER — Other Ambulatory Visit: Payer: Self-pay | Admitting: Physician Assistant

## 2018-05-19 NOTE — Telephone Encounter (Signed)
Last Visit: 01/02/18 Next Visit: 07/01/18 UDS: 03/03/18 Narc Agreement: 07/23/17  Okay to refill Tramadol?

## 2018-05-20 ENCOUNTER — Telehealth: Payer: Self-pay | Admitting: Rheumatology

## 2018-05-20 NOTE — Telephone Encounter (Signed)
Patient called stating Dr. Estanislado Pandy ordered her a TENS unit approximately 14 years ago.  Patient states it just stopped working and she is requesting a new prescription for one.   Patient states she also notices when she brushes her hair a lot is coming out and states when she parts her hair it looks "wider."  Patient is requesting a return call.

## 2018-05-20 NOTE — Telephone Encounter (Signed)
She will need an appointment to discuss TENS unit.  She should see dermatologist for hair loss.

## 2018-05-21 NOTE — Telephone Encounter (Signed)
Patient has been scheduled to be seen on 05/27/18 at 2:20 pm. Patient advised to see a dermatologist for hair loss. Patient verbalized understanding.

## 2018-05-21 NOTE — Telephone Encounter (Signed)
Attempted to contact the patient and left message for patient to call the office.  

## 2018-05-22 NOTE — Progress Notes (Signed)
Office Visit Note  Patient: [name]             Date of Birth: 1971-11-27           MRN: [id number]             PCP: Donald Prose, MD Referring: Donald Prose, MD Visit Date: 05/27/2018 Occupation: @GUAROCC @    Subjective:  Generalized pain   History of Present Illness: [name] is a 47 y.o. female with history of fibromyalgia and osteoarthritis.  She takes tramadol 50 mg tablets 2 tablets every evening at bedtime as needed for pain relief.  She also takes Ambien 10 mg at bedtime to help with insomnia.  She takes Flexeril 10 mg at bedtime for muscle spasms.  She is on gabapentin 600 mg at bedtime.  She was recently switched from Cymbalta to Lexapro 10 mg.  She has not noticed much benefit since switching to Lexapro will be seeing her PCP in July will be increasing her dose at that point.  She states that her fibromyalgia has been flaring for the past 3 weeks.  She states that she has no air conditioning at home so she has not been sleeping well at night.  She has been having worsening insomnia and fatigue.  She has generalized muscle aches and muscle tenderness.  She denies any pain or swelling in her knee joints at this time.  She continues to have trochanteric bursitis bilaterally worse on the left side.  She states that she has been having episodes of dizziness and has fallen 3 times.  She denies seeing her PCP for evaluation.  She states she recently got glasses but has not noticed any improvement of dizziness.  She has been having increased pain on the dorsal aspect of the left foot and left shoulder pain.  She denies any injuries of either area.   She reports that she was seen by [facility] dermatology in February 2019.  They evaluated several spots on her face.  They recommended using clindamycin and benzyl peroxide gel topically for treatment of acne.  They did not take any biopsies or make any formal diagnosis.    Activities of Daily Living:  Patient reports  morning stiffness for 4 hour.   Patient Reports nocturnal pain.  Difficulty dressing/grooming: Denies Difficulty climbing stairs: Reports Difficulty getting out of chair: Denies Difficulty using hands for taps, buttons, cutlery, and/or writing: Denies   Review of Systems  Constitutional: Positive for fatigue.  HENT: Positive for mouth dryness. Negative for mouth sores and nose dryness.   Eyes: Negative for pain, visual disturbance and dryness.  Respiratory: Negative for cough, hemoptysis and difficulty breathing.   Cardiovascular: Negative for chest pain, palpitations, hypertension and swelling in legs/feet.  Gastrointestinal: Negative for blood in stool, constipation, diarrhea and heartburn.  Endocrine: Negative for increased urination.  Genitourinary: Negative for painful urination.  Musculoskeletal: Positive for arthralgias, joint pain, joint swelling and morning stiffness. Negative for myalgias, muscle weakness, muscle tenderness and myalgias.  Skin: Positive for hair loss. Negative for color change, pallor, rash, nodules/bumps, skin tightness, ulcers and sensitivity to sunlight.  Allergic/Immunologic: Negative for susceptible to infections.  Neurological: Positive for headaches. Negative for numbness and memory loss.  Hematological: Positive for bruising/bleeding tendency. Negative for swollen glands.  Psychiatric/Behavioral: Positive for depressed mood. Negative for sleep disturbance. The patient is nervous/anxious.     PMFS History:  Patient Active Problem List   Diagnosis Date Noted  . Fibromyalgia 12/25/2016  . Other  fatigue 12/25/2016  . Primary insomnia 12/25/2016  . Primary osteoarthritis of both knees 12/25/2016  . ANA positive 12/25/2016  . History of gastroesophageal reflux (GERD) 12/25/2016  . History of anemia 12/25/2016  . Trochanteric bursitis of both hips 12/25/2016  . Anemia, iron deficiency 04/18/2015  . Dysphagia, pharyngoesophageal phase 11/14/2011  .  Esophageal reflux 11/14/2011    Past Medical History:  Diagnosis Date  . ADD (attention deficit disorder with hyperactivity)   . Allergic rhinitis   . Anxiety   . Arthritis   . Depression   . Dysmenorrhea   . Fibromyalgia   . GERD (gastroesophageal reflux disease)   . Insomnia   . Irritable bowel syndrome   . Restless leg syndrome     Family History  Problem Relation Age of Onset  . Breast cancer Maternal Grandmother   . Diabetes Sister   . Heart disease Brother   . Stroke Mother   . Diabetes Sister   . Colon cancer Neg Hx   . Colon polyps Neg Hx   . Esophageal cancer Neg Hx   . Kidney disease Neg Hx   . Gallbladder disease Neg Hx   . Stomach cancer Neg Hx    Past Surgical History:  Procedure Laterality Date  . BUNIONECTOMY  2008   left  . DILATION AND CURETTAGE OF UTERUS  2004  . EAR CYST EXCISION  1983   Left   Social History   Social History Narrative  . Not on file     Objective: Vital Signs: BP (!) 125/94 (BP Location: Left Arm, Patient Position: Sitting, Cuff Size: Normal)   Pulse (!) 108   Resp 17   Ht 5\' 5"  (1.651 m)   Wt 240 lb (108.9 kg)   BMI 39.94 kg/m    Physical Exam  Constitutional: She is oriented to person, place, and time. She appears well-developed and well-nourished.  HENT:  Head: Normocephalic and atraumatic.  Eyes: Conjunctivae and EOM are normal.  Neck: Normal range of motion.  Cardiovascular: Normal rate, regular rhythm, normal heart sounds and intact distal pulses.  Pulmonary/Chest: Effort normal and breath sounds normal.  Abdominal: Soft. Bowel sounds are normal.  Lymphadenopathy:    She has no cervical adenopathy.  Neurological: She is alert and oriented to person, place, and time.  Skin: Skin is warm and dry. Capillary refill takes less than 2 seconds.  Psychiatric: She has a normal mood and affect. Her behavior is normal.  Nursing note and vitals reviewed.   Musculoskeletal Exam: C-spine, thoracic spine, lumbar spine  good range of motion.  No midline spinal tenderness.  No SI joint tenderness.  Shoulder joints, elbow joints, wrist joints, MCPs, PIPs, DIPs good range of motion with no synovitis.  No warmth or effusion of bilateral knee joints.  She has bilateral knee crepitus.  She has a dorsal spur on the left foot.  PIP and DIP synovial thickening consistent with osteoarthritis of bilateral feet.  She has tenderness of the left trochanteric bursa.  CDAI Exam: No CDAI exam completed.    Investigation: No additional findings. CBC Latest Ref Rng & Units 11/15/2017 05/20/2017 04/18/2015  WBC 4.0 - 10.5 K/uL 6.4 9.0 6.9  Hemoglobin 12.0 - 15.0 g/dL 9.9(L) 10.1(L) 8.9 Repeated and verified X2.(L)  Hematocrit 36.0 - 46.0 % 34.3(L) 33.5(L) 29.1(L)  Platelets 150 - 400 K/uL 332 280 386.0   CMP Latest Ref Rng & Units 11/15/2017 05/20/2017 02/01/2013  Glucose 65 - 99 mg/dL 150(H) 142(H) 141(H)  BUN  6 - 20 mg/dL 11 11 8   Creatinine 0.44 - 1.00 mg/dL 0.86 0.88 0.77  Sodium 135 - 145 mmol/L 139 136 137  Potassium 3.5 - 5.1 mmol/L 3.7 3.5 3.5  Chloride 101 - 111 mmol/L 105 102 100  CO2 22 - 32 mmol/L 26 25 23   Calcium 8.9 - 10.3 mg/dL 8.8(L) 8.5(L) 8.9  Total Protein 6.5 - 8.1 g/dL 8.1 7.7 8.1  Total Bilirubin 0.3 - 1.2 mg/dL 0.4 0.3 0.4  Alkaline Phos 38 - 126 U/L 90 92 91  AST 15 - 41 U/L 19 20 12   ALT 14 - 54 U/L 15 18 11      Imaging: No results found.  Speciality Comments: No specialty comments available.    Procedures:  No procedures performed Allergies: Patient has no known allergies.   Assessment / Plan:     Visit Diagnoses: Fibromyalgia -she has been having a fibromyalgia flare for the past 3 weeks.  She has generalized muscle aches and muscle tenderness on exam today.  She was recently switched from Cymbalta 90 mg to Lexapro 10 mg and has not noticed any benefit yet.  She continues to take Ambien 10 mg at bedtime, gabapentin 600 mg at bedtime, and Flexeril 10 mg at bedtime for muscle spasms.  She  takes tramadol 50 mg 1 to 2 tablets as needed for pain relief.  She does not need any refills today.  We discussed the importance of regular exercise as well as good sleep hygiene.   Other fatigue: Chronic and worsening related to insomnia.  Primary insomnia: She is been having worsening insomnia due to not having air conditioning for the past 3 weeks.  We discussed the importance of good sleep hygiene and getting rest on a regular basis.  She takes Ambien 10 mg at bedtime.  She is also on gabapentin 600 mg at bedtime and Flexeril 10 mg at bedtime for muscle spasms.  Primary osteoarthritis of both knees: No warmth or effusion of bilateral knee joints.  She has bilateral knee crepitus.  She has no discomfort in any joints at this time.  ANA positive - 1:80 centromere pattern: We will check AVISE labs.  She has hyperpigmentation on the bridge of her nose as well as several lesions on along her jawline.  She is going to follow-up with [facility] dermatology for a skin biopsy.  She is also been having intermittent intranasal ulcerations and increased mouth dryness.  She is also had increased hair loss.  Intranasal ulcers -She was evaluated by [facility].  She continues to have occasional intranasal ulcerations.   Trochanteric bursitis of both hips: She has tenderness of bilateral trochanteric bursa, worse on the left side.  She performs stretching exercises on a daily basis.  She declined a cortisone injection today in the office.  Other medical conditions are listed as follows:  History of gastroesophageal reflux (GERD)  History of anemia  Vitamin D deficiency      Orders: No orders of the defined types were placed in this encounter.  No orders of the defined types were placed in this encounter.   Face-to-face time spent with patient was 30 minutes.> 50% of time was spent in counseling and coordination of care.  Follow-Up Instructions: Return in about 6 months (around  11/26/2018) for Fibromyalgia, Osteoarthritis.   Ofilia Neas, PA-C   I examined and evaluated the patient with Hazel Sams PA.  Patient still has hyperpigmented rash on her face.  I did not see any synovitis  on examination.  I have advised her to schedule an appointment with a dermatologist for possible biopsy.  We will also obtainAVISE labs to evaluate further.  The plan of care was discussed as noted above.  Bo Merino, MD  Note - This record has been created using Editor, commissioning.  Chart creation errors have been sought, but may not always  have been located. Such creation errors do not reflect on  the standard of medical care.

## 2018-05-27 ENCOUNTER — Ambulatory Visit: Payer: PPO | Admitting: Rheumatology

## 2018-05-27 ENCOUNTER — Encounter: Payer: Self-pay | Admitting: Physician Assistant

## 2018-05-27 ENCOUNTER — Encounter: Payer: Self-pay | Admitting: Rheumatology

## 2018-05-27 VITALS — BP 125/94 | HR 108 | Resp 17 | Ht 65.0 in | Wt 240.0 lb

## 2018-05-27 DIAGNOSIS — M170 Bilateral primary osteoarthritis of knee: Secondary | ICD-10-CM

## 2018-05-27 DIAGNOSIS — D8989 Other specified disorders involving the immune mechanism, not elsewhere classified: Secondary | ICD-10-CM | POA: Diagnosis not present

## 2018-05-27 DIAGNOSIS — M7062 Trochanteric bursitis, left hip: Secondary | ICD-10-CM

## 2018-05-27 DIAGNOSIS — Z862 Personal history of diseases of the blood and blood-forming organs and certain disorders involving the immune mechanism: Secondary | ICD-10-CM | POA: Diagnosis not present

## 2018-05-27 DIAGNOSIS — F5101 Primary insomnia: Secondary | ICD-10-CM | POA: Diagnosis not present

## 2018-05-27 DIAGNOSIS — E559 Vitamin D deficiency, unspecified: Secondary | ICD-10-CM

## 2018-05-27 DIAGNOSIS — J340 Abscess, furuncle and carbuncle of nose: Secondary | ICD-10-CM | POA: Diagnosis not present

## 2018-05-27 DIAGNOSIS — R5383 Other fatigue: Secondary | ICD-10-CM | POA: Diagnosis not present

## 2018-05-27 DIAGNOSIS — Z8719 Personal history of other diseases of the digestive system: Secondary | ICD-10-CM | POA: Diagnosis not present

## 2018-05-27 DIAGNOSIS — M7061 Trochanteric bursitis, right hip: Secondary | ICD-10-CM | POA: Diagnosis not present

## 2018-05-27 DIAGNOSIS — M797 Fibromyalgia: Secondary | ICD-10-CM

## 2018-05-27 DIAGNOSIS — K219 Gastro-esophageal reflux disease without esophagitis: Secondary | ICD-10-CM | POA: Diagnosis not present

## 2018-05-27 DIAGNOSIS — R768 Other specified abnormal immunological findings in serum: Secondary | ICD-10-CM

## 2018-05-28 ENCOUNTER — Telehealth: Payer: Self-pay | Admitting: Rheumatology

## 2018-05-28 NOTE — Telephone Encounter (Signed)
Patient called stating she saw Lovena Le yesterday and forgot to ask her about getting a prescription for a TENS unit.  Patient is requesting a return call.

## 2018-05-29 NOTE — Telephone Encounter (Signed)
Please give patient a prescription for TENS unit with 4 electrodes , 2 for cervical region and to 4 lumbar region.

## 2018-05-29 NOTE — Telephone Encounter (Signed)
Patient states she uses her TENS unit on her shoulders and on her lower back.  Patient has an appointment with the dermatologist on 05/30/18. Patient did her blood work on 05/27/18 and we will be getting a copy of her [facility]ap labs as soon as they are ready.

## 2018-05-30 DIAGNOSIS — L700 Acne vulgaris: Secondary | ICD-10-CM | POA: Diagnosis not present

## 2018-05-30 DIAGNOSIS — L821 Other seborrheic keratosis: Secondary | ICD-10-CM | POA: Diagnosis not present

## 2018-06-04 NOTE — Telephone Encounter (Signed)
Patient advised prescription for TENS unit is ready.

## 2018-06-18 ENCOUNTER — Other Ambulatory Visit: Payer: Self-pay | Admitting: Rheumatology

## 2018-06-18 ENCOUNTER — Other Ambulatory Visit: Payer: Self-pay | Admitting: Physician Assistant

## 2018-06-18 NOTE — Telephone Encounter (Signed)
Last Visit: 05/27/18 Next visit: 11/25/18 UDS: 03/03/18 Narc Agreement: 07/23/18  Okay to refill Tramadol?

## 2018-06-18 NOTE — Telephone Encounter (Signed)
Last Visit: 05/27/18 Next Visit due December 2019. Message sent to the front to schedule patient.   Okay to refill per Dr. Estanislado Pandy

## 2018-06-23 DIAGNOSIS — R7303 Prediabetes: Secondary | ICD-10-CM | POA: Diagnosis not present

## 2018-06-23 DIAGNOSIS — M797 Fibromyalgia: Secondary | ICD-10-CM | POA: Diagnosis not present

## 2018-06-23 DIAGNOSIS — L659 Nonscarring hair loss, unspecified: Secondary | ICD-10-CM | POA: Diagnosis not present

## 2018-06-23 DIAGNOSIS — Z1389 Encounter for screening for other disorder: Secondary | ICD-10-CM | POA: Diagnosis not present

## 2018-06-23 DIAGNOSIS — Z0000 Encounter for general adult medical examination without abnormal findings: Secondary | ICD-10-CM | POA: Diagnosis not present

## 2018-06-23 DIAGNOSIS — F411 Generalized anxiety disorder: Secondary | ICD-10-CM | POA: Diagnosis not present

## 2018-07-01 ENCOUNTER — Ambulatory Visit: Payer: PPO | Admitting: Physician Assistant

## 2018-07-21 ENCOUNTER — Other Ambulatory Visit: Payer: Self-pay | Admitting: Physician Assistant

## 2018-07-21 NOTE — Telephone Encounter (Signed)
Ok to refill. Please notify Pt that she needs to update narcotic agreement.

## 2018-07-21 NOTE — Telephone Encounter (Signed)
Last visit: 05/27/2018 Next visit: 11/25/2018 UDS: 03/03/2018 c/w Narc agreement: 08/02/2017  Last fill: 06/18/2018  Okay to refill tramadol?

## 2018-07-21 NOTE — Telephone Encounter (Signed)
Advised patient the refill has been sent. Also advised patient that before her next refill, she is due to update UDS and narcotic agreement. Patient verbalized understanding.

## 2018-07-23 DIAGNOSIS — R922 Inconclusive mammogram: Secondary | ICD-10-CM | POA: Diagnosis not present

## 2018-08-02 ENCOUNTER — Other Ambulatory Visit: Payer: Self-pay | Admitting: Physician Assistant

## 2018-08-04 NOTE — Telephone Encounter (Signed)
Last visit: 05/27/2018 Next visit: 11/25/2018  Okay to refill Ambien?

## 2018-08-21 ENCOUNTER — Telehealth: Payer: Self-pay | Admitting: Rheumatology

## 2018-08-21 NOTE — Telephone Encounter (Signed)
Called patient to discuss AVISE labs, per Dr. Estanislado Pandy. Patient verbalized understanding. Labs will be sent to the scan center.

## 2018-08-21 NOTE — Telephone Encounter (Signed)
Patient called requesting a return call with the results from her bloodwork.  Patient states she went to [facility].

## 2018-08-25 ENCOUNTER — Other Ambulatory Visit: Payer: Self-pay | Admitting: *Deleted

## 2018-08-25 ENCOUNTER — Other Ambulatory Visit: Payer: Self-pay | Admitting: Rheumatology

## 2018-08-25 ENCOUNTER — Other Ambulatory Visit: Payer: Self-pay

## 2018-08-25 DIAGNOSIS — Z5181 Encounter for therapeutic drug level monitoring: Secondary | ICD-10-CM

## 2018-08-25 DIAGNOSIS — E559 Vitamin D deficiency, unspecified: Secondary | ICD-10-CM | POA: Diagnosis not present

## 2018-08-26 ENCOUNTER — Ambulatory Visit (INDEPENDENT_AMBULATORY_CARE_PROVIDER_SITE_OTHER): Payer: PPO | Admitting: Orthopaedic Surgery

## 2018-08-26 ENCOUNTER — Ambulatory Visit (INDEPENDENT_AMBULATORY_CARE_PROVIDER_SITE_OTHER): Payer: PPO

## 2018-08-26 ENCOUNTER — Encounter (INDEPENDENT_AMBULATORY_CARE_PROVIDER_SITE_OTHER): Payer: Self-pay | Admitting: Orthopaedic Surgery

## 2018-08-26 ENCOUNTER — Telehealth: Payer: Self-pay | Admitting: *Deleted

## 2018-08-26 VITALS — BP 141/96 | HR 96 | Ht 65.0 in | Wt 240.0 lb

## 2018-08-26 DIAGNOSIS — E559 Vitamin D deficiency, unspecified: Secondary | ICD-10-CM

## 2018-08-26 DIAGNOSIS — M25572 Pain in left ankle and joints of left foot: Secondary | ICD-10-CM

## 2018-08-26 MED ORDER — VITAMIN D (ERGOCALCIFEROL) 1.25 MG (50000 UNIT) PO CAPS: 50000.0000 [IU] | ORAL_CAPSULE | ORAL | 0 refills | Status: DC

## 2018-08-26 NOTE — Telephone Encounter (Signed)
-----   Message from Bo Merino, MD sent at 08/26/2018  8:48 AM EDT ----- Please call in vitamin D 50,000 units twice a week for 3 months repeat vitamin D in 3 months

## 2018-08-26 NOTE — Telephone Encounter (Signed)
Last visit: 05/27/2018 Next visit: 11/25/2018  Okay to refill per Dr. Estanislado Pandy

## 2018-08-26 NOTE — Progress Notes (Signed)
Office Visit Note   Patient: [name]           Date of Birth: 1971-08-05           MRN: [id number] Visit Date: 08/26/2018              Requested by: Donald Prose, MD [address] [place], [place] [address] PCP: Donald Prose, MD   Assessment & Plan: Visit Diagnoses:  1. Pain in left ankle and joints of left foot     Plan: Degenerative arthritis midfoot.  Might have posterior tibial tendinitis.  Will apply equalizer boot and use Voltaren gel.  Return in 6 to 8 weeks.  Consider MRI scan if pain persists versus local injections in the midfoot  Follow-Up Instructions: Return if symptoms worsen or fail to improve.   Orders:  Orders Placed This Encounter  Procedures  . XR Foot Complete Left   No orders of the defined types were placed in this encounter.     Procedures: No procedures performed   Clinical Data: No additional findings.   Subjective: Chief Complaint  Patient presents with  . Follow-up    L FOOT PAIN FOR 1 YR JUST GETTING WORSE RADIATES FROM TOP OF FOOT TO BACK OF ANKLE  [name] relates about a year history of a problem with her left foot.  She denies any injury or trauma.  She has difficulty bearing weight.  Mild swelling behind the ankle.  No numbness or tingling.  She is on disability and is not working.  Does have Voltaren gel which she has tried on a limited basis.  Experiencing any pain toes.  No heel pain.  Pain seems to originate in the midfoot and radiates along the posterior medial aspect of the ankle to the level of the Achilles tendon.  HPI  Review of Systems  Constitutional: Positive for fatigue. Negative for fever.  HENT: Negative for ear pain.   Eyes: Negative for pain.  Respiratory: Negative for cough and shortness of breath.   Cardiovascular: Negative for leg swelling.  Gastrointestinal: Negative for constipation and diarrhea.  Genitourinary: Negative for difficulty urinating.  Musculoskeletal:  Negative for back pain and neck pain.  Skin: Negative for rash.  Allergic/Immunologic: Negative for food allergies.  Neurological: Positive for weakness. Negative for numbness.  Hematological: Bruises/bleeds easily.  Psychiatric/Behavioral: Positive for sleep disturbance.     Objective: Vital Signs: BP (!) 141/96 (BP Location: Left Arm, Patient Position: Sitting, Cuff Size: Normal)   Pulse 96   Ht 5\' 5"  (1.651 m)   Wt 240 lb (108.9 kg)   BMI 39.94 kg/m   Physical Exam  Constitutional: She is oriented to person, place, and time. She appears well-developed and well-nourished.  HENT:  Mouth/Throat: Oropharynx is clear and moist.  Eyes: Pupils are equal, round, and reactive to light. EOM are normal.  Pulmonary/Chest: Effort normal.  Neurological: She is alert and oriented to person, place, and time.  Skin: Skin is warm and dry.  Psychiatric: She has a normal mood and affect. Her behavior is normal.    Ortho Exam awake alert and oriented x3.  Comfortable sitting.  Examination left foot reveals some tenderness across the mid foot dorsally.  No crepitation.  Neurovascular exam appears to be intact.  No forefoot pain.  No heel pain.  Mild discomfort along the posterior tibial tendon behind the medial malleolus with slight swelling no crepitation.  Sensory exam intact.  Skin intact.  No ankle pain.  Specialty Comments:  No specialty comments available.  Imaging: Xr Foot Complete Left  Result Date: 08/26/2018 Films of the left foot were obtained several projections.  There are some degenerative changes in the midfoot with slight sagging of the navicular.  No acute changes.    PMFS History: Patient Active Problem List   Diagnosis Date Noted  . Fibromyalgia 12/25/2016  . Other fatigue 12/25/2016  . Primary insomnia 12/25/2016  . Primary osteoarthritis of both knees 12/25/2016  . ANA positive 12/25/2016  . History of gastroesophageal reflux (GERD) 12/25/2016  . History of anemia  12/25/2016  . Trochanteric bursitis of both hips 12/25/2016  . Anemia, iron deficiency 04/18/2015  . Dysphagia, pharyngoesophageal phase 11/14/2011  . Esophageal reflux 11/14/2011   Past Medical History:  Diagnosis Date  . ADD (attention deficit disorder with hyperactivity)   . Allergic rhinitis   . Anxiety   . Arthritis   . Depression   . Dysmenorrhea   . Fibromyalgia   . GERD (gastroesophageal reflux disease)   . Insomnia   . Irritable bowel syndrome   . Restless leg syndrome     Family History  Problem Relation Age of Onset  . Breast cancer Maternal Grandmother   . Diabetes Sister   . Heart disease Brother   . Stroke Mother   . Diabetes Sister   . Colon cancer Neg Hx   . Colon polyps Neg Hx   . Esophageal cancer Neg Hx   . Kidney disease Neg Hx   . Gallbladder disease Neg Hx   . Stomach cancer Neg Hx     Past Surgical History:  Procedure Laterality Date  . BUNIONECTOMY  2008   left  . DILATION AND CURETTAGE OF UTERUS  2004  . EAR CYST EXCISION  1983   Left   Social History   Occupational History  . Occupation: Disabled  Tobacco Use  . Smoking status: Former Smoker    Packs/day: 0.10    Years: 12.00    Pack years: 1.20    Types: Cigarettes    Last attempt to quit: 12/27/1993    Years since quitting: 24.6  . Smokeless tobacco: Never Used  Substance and Sexual Activity  . Alcohol use: No  . Drug use: No  . Sexual activity: Yes

## 2018-08-28 ENCOUNTER — Other Ambulatory Visit: Payer: Self-pay | Admitting: Physician Assistant

## 2018-08-28 LAB — PAIN MGMT, PROFILE 5 W/CONF, U
Amphetamines: NEGATIVE ng/mL (ref <500)
Barbiturates: NEGATIVE ng/mL (ref <300)
Benzodiazepines: NEGATIVE ng/mL (ref <100)
Cocaine Metabolite: NEGATIVE ng/mL (ref <150)
Creatinine: 134.5 mg/dL
Marijuana Metabolite: NEGATIVE ng/mL (ref <20)
Methadone Metabolite: NEGATIVE ng/mL (ref <100)
Opiates: NEGATIVE ng/mL (ref <100)
Oxidant: NEGATIVE ug/mL (ref <200)
Oxycodone: NEGATIVE ng/mL (ref <100)
pH: 7.56 (ref 4.5–9.0)

## 2018-08-28 LAB — PAIN MGMT, TRAMADOL W/MEDMATCH, U
Desmethyltramadol: 4501 ng/mL — ABNORMAL HIGH (ref <100)
Tramadol: 23800 ng/mL — ABNORMAL HIGH (ref <100)

## 2018-08-28 LAB — VITAMIN D 25 HYDROXY (VIT D DEFICIENCY, FRACTURES): Vit D, 25-Hydroxy: 14 ng/mL — ABNORMAL LOW (ref 30–100)

## 2018-08-28 NOTE — Telephone Encounter (Signed)
Last visit: 05/27/2018 Next visit: 11/25/2018 UDS: 08/25/18 Narc Agreement: 08/25/18  Okay to refill Tramadol?

## 2018-09-29 ENCOUNTER — Other Ambulatory Visit: Payer: Self-pay | Admitting: Physician Assistant

## 2018-09-29 NOTE — Telephone Encounter (Signed)
Last visit: 05/27/2018 Next visit: 11/25/2018 UDS: 08/25/18 Narc Agreement: 08/25/18  Last Visit: 08/28/18  Okay to refill Tramadol?

## 2018-10-12 ENCOUNTER — Other Ambulatory Visit: Payer: Self-pay | Admitting: Rheumatology

## 2018-11-06 ENCOUNTER — Telehealth: Payer: Self-pay | Admitting: Rheumatology

## 2018-11-06 MED ORDER — TRAMADOL HCL 50 MG PO TABS: ORAL_TABLET | ORAL | 0 refills | Status: DC

## 2018-11-06 MED ORDER — CYCLOBENZAPRINE HCL 10 MG PO TABS: 10.0000 mg | ORAL_TABLET | Freq: Every day | ORAL | 1 refills | Status: DC

## 2018-11-06 NOTE — Telephone Encounter (Signed)
Patient requesting a refill on Tramadol, and Flexeril sent to [facility] on [address]. Patient has moved, and request rx's to be sent to this location.

## 2018-11-06 NOTE — Telephone Encounter (Signed)
Last visit: 05/27/2018 Next visit: 11/25/2018 UDS: 08/25/18 Narc Agreement: 08/25/18  Okay to refillTramadol and Flexeril?

## 2018-11-12 NOTE — Progress Notes (Deleted)
Office Visit Note  Patient: [name]             Date of Birth: October 08, 1971           MRN: [id number]             PCP: Donald Prose, MD Referring: Donald Prose, MD Visit Date: 11/25/2018 Occupation: @GUAROCC @  Subjective:  No chief complaint on file.   History of Present Illness: [name] is a 47 y.o. female ***   Activities of Daily Living:  Patient reports morning stiffness for *** {minute/hour:19697}.   Patient {ACTIONS;DENIES/REPORTS:21021675::"Denies"} nocturnal pain.  Difficulty dressing/grooming: {ACTIONS;DENIES/REPORTS:21021675::"Denies"} Difficulty climbing stairs: {ACTIONS;DENIES/REPORTS:21021675::"Denies"} Difficulty getting out of chair: {ACTIONS;DENIES/REPORTS:21021675::"Denies"} Difficulty using hands for taps, buttons, cutlery, and/or writing: {ACTIONS;DENIES/REPORTS:21021675::"Denies"}  No Rheumatology ROS completed.   PMFS History:  Patient Active Problem List   Diagnosis Date Noted  . Fibromyalgia 12/25/2016  . Other fatigue 12/25/2016  . Primary insomnia 12/25/2016  . Primary osteoarthritis of both knees 12/25/2016  . ANA positive 12/25/2016  . History of gastroesophageal reflux (GERD) 12/25/2016  . History of anemia 12/25/2016  . Trochanteric bursitis of both hips 12/25/2016  . Anemia, iron deficiency 04/18/2015  . Dysphagia, pharyngoesophageal phase 11/14/2011  . Esophageal reflux 11/14/2011    Past Medical History:  Diagnosis Date  . ADD (attention deficit disorder with hyperactivity)   . Allergic rhinitis   . Anxiety   . Arthritis   . Depression   . Dysmenorrhea   . Fibromyalgia   . GERD (gastroesophageal reflux disease)   . Insomnia   . Irritable bowel syndrome   . Restless leg syndrome     Family History  Problem Relation Age of Onset  . Breast cancer Maternal Grandmother   . Diabetes Sister   . Heart disease Brother   . Stroke Mother   . Diabetes Sister   . Colon cancer Neg Hx   . Colon polyps Neg Hx    . Esophageal cancer Neg Hx   . Kidney disease Neg Hx   . Gallbladder disease Neg Hx   . Stomach cancer Neg Hx    Past Surgical History:  Procedure Laterality Date  . BUNIONECTOMY  2008   left  . DILATION AND CURETTAGE OF UTERUS  2004  . EAR CYST EXCISION  1983   Left   Social History   Social History Narrative  . Not on file    Objective: Vital Signs: There were no vitals taken for this visit.   Physical Exam   Musculoskeletal Exam: ***  CDAI Exam: CDAI Score: Not documented Patient Global Assessment: Not documented; Provider Global Assessment: Not documented Swollen: Not documented; Tender: Not documented Joint Exam   Not documented   There is currently no information documented on the homunculus. Go to the Rheumatology activity and complete the homunculus joint exam.  Investigation: No additional findings.  Imaging: No results found.  Recent Labs: Lab Results  Component Value Date   WBC 6.4 11/15/2017   HGB 9.9 (L) 11/15/2017   PLT 332 11/15/2017   NA 139 11/15/2017   K 3.7 11/15/2017   CL 105 11/15/2017   CO2 26 11/15/2017   GLUCOSE 150 (H) 11/15/2017   BUN 11 11/15/2017   CREATININE 0.86 11/15/2017   BILITOT 0.4 11/15/2017   ALKPHOS 90 11/15/2017   AST 19 11/15/2017   ALT 15 11/15/2017   PROT 8.1 11/15/2017   ALBUMIN 3.9 11/15/2017   CALCIUM 8.8 (L) 11/15/2017   GFRAA >60 11/15/2017  Speciality Comments: No specialty comments available.  Procedures:  No procedures performed Allergies: Patient has no known allergies.   Assessment / Plan:     Visit Diagnoses: No diagnosis found.   Orders: No orders of the defined types were placed in this encounter.  No orders of the defined types were placed in this encounter.   Face-to-face time spent with patient was *** minutes. Greater than 50% of time was spent in counseling and coordination of care.  Follow-Up Instructions: No follow-ups on file.   Earnestine Mealing, CMA  Note - This  record has been created using Editor, commissioning.  Chart creation errors have been sought, but may not always  have been located. Such creation errors do not reflect on  the standard of medical care.

## 2018-11-25 ENCOUNTER — Ambulatory Visit: Payer: PPO | Admitting: Physician Assistant

## 2018-12-12 NOTE — Progress Notes (Signed)
Office Visit Note  Patient: [name]             Date of Birth: 1971-07-18           MRN: [id number]             PCP: Donald Prose, MD Referring: Donald Prose, MD Visit Date: 12/16/2018 Occupation: @GUAROCC @  Subjective:  Generalized pain   History of Present Illness: [name] is a 47 y.o. female with history of fibromyalgia and osteoarthritis.  She is taking Flexeril 10 mg po at bedtime for muscle spasms.  She takes tramadol 50 mg 2 tablets by mouth at bedtime for pain relief.  She is taking Ambien 10 mg po at bedtime.  She uses voltaren gel topically PRN for pain relief.  She reports she recently has had several falls. She states her last fall was around Thanksgiving.  She states she fell down for 4 stairs, which caused increased pain in right knee joint.  She has noticed some right knee joint swelling if standing for prolonged periods of time. She has not followed up with Dr. Durward Fortes recently regarding the right knee joint pain.  She has recently moved to the first floor of an apartment which has helped. She continues to have left heel pain, which did not improve after wearing the boot.  She will be scheduling a follow up visit with Dr. Durward Fortes soon.  Patient but she has been having increased pain in both hands and has noticed decreased grip strength.  She denies any joint swelling.  She reports that she has been having worsening fatigue recently.  She states that she is only been sleeping about 3 hours per night.  She states that she has very interrupted sleep at night.  She states that she snores but is not a sleep study in several years.  She states that she was diagnosed with restless leg syndrome but not sleep apnea at that time.  She continues have generalized muscle aches and muscle tenderness.  She states she is been having more frequent muscle spasms recently.  She continues to have intermittent trochanteric bursitis bilaterally.  She perform stretching  exercises on a regular basis. Patient reports that she has been having right upper quadrant pain off and on for several weeks.  She states that she is following up with her primary care next week for evaluation.  She denies any associated symptoms at this time.  Activities of Daily Living:  Patient reports morning stiffness for 2 hours.   Patient Reports nocturnal pain.  Difficulty dressing/grooming: Denies Difficulty climbing stairs: Reports Difficulty getting out of chair: Denies Difficulty using hands for taps, buttons, cutlery, and/or writing: Reports  Review of Systems  Constitutional: Positive for fatigue.  HENT: Positive for mouth dryness. Negative for mouth sores, trouble swallowing, trouble swallowing and nose dryness.   Eyes: Negative for pain, redness, itching, visual disturbance and dryness.  Respiratory: Negative for cough, hemoptysis, shortness of breath, wheezing and difficulty breathing.   Cardiovascular: Positive for palpitations. Negative for hypertension, irregular heartbeat and swelling in legs/feet.  Gastrointestinal: Positive for abdominal pain. Negative for blood in stool, constipation and diarrhea.  Endocrine: Negative for increased urination.  Genitourinary: Negative for painful urination and nocturia.  Musculoskeletal: Positive for arthralgias, joint pain, joint swelling and morning stiffness. Negative for myalgias, muscle weakness, muscle tenderness and myalgias.  Skin: Positive for rash. Negative for color change, pallor, hair loss, nodules/bumps, skin tightness, ulcers and sensitivity to sunlight.  Allergic/Immunologic: Negative  for susceptible to infections.  Neurological: Positive for headaches. Negative for light-headedness.  Hematological: Negative for swollen glands.  Psychiatric/Behavioral: Positive for depressed mood. Negative for confusion and sleep disturbance. The patient is nervous/anxious.     PMFS History:  Patient Active Problem List    Diagnosis Date Noted  . Fibromyalgia 12/25/2016  . Other fatigue 12/25/2016  . Primary insomnia 12/25/2016  . Primary osteoarthritis of both knees 12/25/2016  . ANA positive 12/25/2016  . History of gastroesophageal reflux (GERD) 12/25/2016  . History of anemia 12/25/2016  . Trochanteric bursitis of both hips 12/25/2016  . Anemia, iron deficiency 04/18/2015  . Dysphagia, pharyngoesophageal phase 11/14/2011  . Esophageal reflux 11/14/2011    Past Medical History:  Diagnosis Date  . ADD (attention deficit disorder with hyperactivity)   . Allergic rhinitis   . Anxiety   . Arthritis   . Depression   . Dysmenorrhea   . Fibromyalgia   . GERD (gastroesophageal reflux disease)   . Insomnia   . Irritable bowel syndrome   . Restless leg syndrome     Family History  Problem Relation Age of Onset  . Breast cancer Maternal Grandmother   . Diabetes Sister   . Heart disease Brother   . Stroke Mother   . Diabetes Sister   . Colon cancer Neg Hx   . Colon polyps Neg Hx   . Esophageal cancer Neg Hx   . Kidney disease Neg Hx   . Gallbladder disease Neg Hx   . Stomach cancer Neg Hx    Past Surgical History:  Procedure Laterality Date  . BUNIONECTOMY  2008   left  . DILATION AND CURETTAGE OF UTERUS  2004  . EAR CYST EXCISION  1983   Left   Social History   Social History Narrative  . Not on file    Objective: Vital Signs: BP (!) 144/96 (BP Location: Left Arm, Patient Position: Sitting, Cuff Size: Normal)   Pulse (!) 110   Resp 14   Ht 5\' 5"  (1.651 m)   Wt 230 lb (104.3 kg)   BMI 38.27 kg/m    Physical Exam Vitals signs and nursing note reviewed.  Constitutional:      Appearance: She is well-developed.  HENT:     Head: Normocephalic and atraumatic.  Eyes:     Conjunctiva/sclera: Conjunctivae normal.  Neck:     Musculoskeletal: Normal range of motion.  Cardiovascular:     Rate and Rhythm: Normal rate and regular rhythm.     Heart sounds: Normal heart sounds.    Pulmonary:     Effort: Pulmonary effort is normal.     Breath sounds: Normal breath sounds.  Abdominal:     General: Bowel sounds are normal.     Palpations: Abdomen is soft.  Lymphadenopathy:     Cervical: No cervical adenopathy.  Skin:    General: Skin is warm and dry.     Capillary Refill: Capillary refill takes less than 2 seconds.  Neurological:     Mental Status: She is alert and oriented to person, place, and time.  Psychiatric:        Behavior: Behavior normal.      Musculoskeletal Exam: She has positive tender points and generalized hyperalgesia on exam.  She has trapezius muscle spasms and muscle tension bilaterally.  C-spine, thoracic spine, lumbar spine good range of motion with some discomfort.  Shoulder joints, elbow joints, wrist joints, MCPs, PIPs, DIPs good range of motion with no synovitis.  She  has complete fist formation bilaterally.  Hip joints good range of motion with no discomfort.  She has no tenderness over trochanteric bursa at this time.  Knee joints, ankle joints, MTPs, PIPs, DIPs good range of motion with no synovitis.  No warmth or effusion of bilateral knee joints.  No tenderness or swelling of ankle joints.  CDAI Exam: CDAI Score: Not documented Patient Global Assessment: Not documented; Provider Global Assessment: Not documented Swollen: Not documented; Tender: Not documented Joint Exam   Not documented   There is currently no information documented on the homunculus. Go to the Rheumatology activity and complete the homunculus joint exam.  Investigation: No additional findings.  Imaging: No results found.  Recent Labs: Lab Results  Component Value Date   WBC 6.4 11/15/2017   HGB 9.9 (L) 11/15/2017   PLT 332 11/15/2017   NA 139 11/15/2017   K 3.7 11/15/2017   CL 105 11/15/2017   CO2 26 11/15/2017   GLUCOSE 150 (H) 11/15/2017   BUN 11 11/15/2017   CREATININE 0.86 11/15/2017   BILITOT 0.4 11/15/2017   ALKPHOS 90 11/15/2017   AST 19  11/15/2017   ALT 15 11/15/2017   PROT 8.1 11/15/2017   ALBUMIN 3.9 11/15/2017   CALCIUM 8.8 (L) 11/15/2017   GFRAA >60 11/15/2017    Speciality Comments: No specialty comments available.  Procedures:  No procedures performed Allergies: Patient has no known allergies.   Assessment / Plan:     Visit Diagnoses: Fibromyalgia -she continues have generalized hyperalgesia and positive tender points on exam.  She has generalized muscle aches and muscle tenderness.  She has been having increased muscle spasms recently.  She continues take Flexeril 10 mg 1 tablet by mouth at bedtime.  She has been having worsening fatigue and insomnia.  She sleeps about 3 hours per night.  She is interrupted sleep at night due to discomfort as well as waking herself up snoring.  She was encouraged to discuss with her PCP ordering a sleep study.  She continues take Ambien 10 mg 1 tablet by mouth at bedtime.  She is also been taking tramadol 50 mg 2 tablets by mouth at bedtime for pain relief.  Several months ago she switched from Cymbalta 60 mg by mouth daily to Lexapro 10 mg by mouth daily which seems to be managing her depression.  She continues to take gabapentin 600 mg by mouth at bedtime.  She is requesting a refill of tramadol today.  Refill be sent to the pharmacy.  She was encouraged to stay active and exercise on a regular basis.  We discussed conceive hygiene habits.  Other fatigue: She is been having worsening fatigue related to insomnia.  She is only been sleeping about 3 hours per night.  She was encouraged to get a sleep study.  Primary insomnia -She takes Ambien 10 mg at bedtime.  She is also on gabapentin 600 mg at bedtime and Flexeril 10 mg at bedtime for muscle spasms.  Good sleep hygiene was discussed.  She was encouraged to discuss with her primary care ordering a sleep study.  She reports that Ambien helps her fall asleep at night but she wakes herself up snoring several times per night and has  difficulty falling back asleep.  Primary osteoarthritis of both knees: No warmth or effusion noted.  She is good range of motion.  She is been having increased right knee pain since falling on her knee around Thanksgiving.  She felt on for steps and landed  on her right knee.  She will be following up with Dr. Durward Fortes soon.  She continues to use Voltaren gel topically PRN.  Trochanteric bursitis of both hips: She has no tenderness on exam today but has intermittent bilateral trochanteric bursitis.  She performs stretching exercises on a regular basis.  ANA positive: She has no clinical features of autoimmune disease at this time.  Other medical conditions are listed as follows:  Vitamin D deficiency  History of gastroesophageal reflux (GERD)  History of anemia   Orders: No orders of the defined types were placed in this encounter.  Meds ordered this encounter  Medications  . traMADol (ULTRAM) 50 MG tablet    Sig: TAKE 2 TABLETS BY MOUTH EVERY EVENING AT BEDTIME AS NEEDED FOR PAIN    Dispense:  60 tablet    Refill:  0    Not to exceed 5 additional fills before 02/24/2019    Face-to-face time spent with patient was 30 minutes. Greater than 50% of time was spent in counseling and coordination of care.  Follow-Up Instructions: Return in about 6 months (around 06/16/2019) for Fibromyalgia, Osteoarthritis.   Ofilia Neas, PA-C  Note - This record has been created using Dragon software.  Chart creation errors have been sought, but may not always  have been located. Such creation errors do not reflect on  the standard of medical care.

## 2018-12-16 ENCOUNTER — Encounter: Payer: Self-pay | Admitting: Physician Assistant

## 2018-12-16 ENCOUNTER — Telehealth: Payer: Self-pay | Admitting: Rheumatology

## 2018-12-16 ENCOUNTER — Ambulatory Visit (INDEPENDENT_AMBULATORY_CARE_PROVIDER_SITE_OTHER): Payer: PPO | Admitting: Physician Assistant

## 2018-12-16 VITALS — BP 144/96 | HR 110 | Resp 14 | Ht 65.0 in | Wt 230.0 lb

## 2018-12-16 DIAGNOSIS — M7062 Trochanteric bursitis, left hip: Secondary | ICD-10-CM | POA: Diagnosis not present

## 2018-12-16 DIAGNOSIS — M170 Bilateral primary osteoarthritis of knee: Secondary | ICD-10-CM

## 2018-12-16 DIAGNOSIS — R7689 Other specified abnormal immunological findings in serum: Secondary | ICD-10-CM

## 2018-12-16 DIAGNOSIS — M7061 Trochanteric bursitis, right hip: Secondary | ICD-10-CM | POA: Diagnosis not present

## 2018-12-16 DIAGNOSIS — Z8719 Personal history of other diseases of the digestive system: Secondary | ICD-10-CM

## 2018-12-16 DIAGNOSIS — R768 Other specified abnormal immunological findings in serum: Secondary | ICD-10-CM | POA: Diagnosis not present

## 2018-12-16 DIAGNOSIS — E559 Vitamin D deficiency, unspecified: Secondary | ICD-10-CM | POA: Diagnosis not present

## 2018-12-16 DIAGNOSIS — F5101 Primary insomnia: Secondary | ICD-10-CM | POA: Diagnosis not present

## 2018-12-16 DIAGNOSIS — Z862 Personal history of diseases of the blood and blood-forming organs and certain disorders involving the immune mechanism: Secondary | ICD-10-CM

## 2018-12-16 DIAGNOSIS — R5383 Other fatigue: Secondary | ICD-10-CM | POA: Diagnosis not present

## 2018-12-16 DIAGNOSIS — M797 Fibromyalgia: Secondary | ICD-10-CM

## 2018-12-16 MED ORDER — TRAMADOL HCL 50 MG PO TABS: ORAL_TABLET | ORAL | 0 refills | Status: DC

## 2018-12-16 MED ORDER — ZOLPIDEM TARTRATE 10 MG PO TABS: ORAL_TABLET | ORAL | 2 refills | Status: DC

## 2018-12-16 NOTE — Telephone Encounter (Signed)
Patient called stating that she does need a refill of her Ambien to be sent to [facility] on [facility].

## 2018-12-16 NOTE — Telephone Encounter (Signed)
Last Visit: 12/16/18 Next visit: 06/16/19  Okay to refill Ambien?

## 2018-12-23 DIAGNOSIS — Z1231 Encounter for screening mammogram for malignant neoplasm of breast: Secondary | ICD-10-CM | POA: Diagnosis not present

## 2018-12-23 DIAGNOSIS — Z803 Family history of malignant neoplasm of breast: Secondary | ICD-10-CM | POA: Diagnosis not present

## 2019-01-06 DIAGNOSIS — G479 Sleep disorder, unspecified: Secondary | ICD-10-CM | POA: Diagnosis not present

## 2019-01-06 DIAGNOSIS — R7303 Prediabetes: Secondary | ICD-10-CM | POA: Diagnosis not present

## 2019-01-06 DIAGNOSIS — R400 Somnolence: Secondary | ICD-10-CM | POA: Diagnosis not present

## 2019-01-06 DIAGNOSIS — M797 Fibromyalgia: Secondary | ICD-10-CM | POA: Diagnosis not present

## 2019-01-06 DIAGNOSIS — F411 Generalized anxiety disorder: Secondary | ICD-10-CM | POA: Diagnosis not present

## 2019-01-16 ENCOUNTER — Telehealth: Payer: Self-pay | Admitting: Rheumatology

## 2019-01-16 MED ORDER — TRAMADOL HCL 50 MG PO TABS: ORAL_TABLET | ORAL | 0 refills | Status: DC

## 2019-01-16 MED ORDER — CYCLOBENZAPRINE HCL 10 MG PO TABS: 10.0000 mg | ORAL_TABLET | Freq: Every day | ORAL | 1 refills | Status: DC

## 2019-01-16 NOTE — Telephone Encounter (Signed)
Patient called requesting prescription refills of Tramadol, Flexeril, and Zolpidem sent to [facility] at [facility] in [place].

## 2019-01-16 NOTE — Telephone Encounter (Signed)
Last Visit: 12/16/18 Next Visit: 06/16/19 UDS:08/25/18 Narc Agreement: 08/25/18  Okay to refill Tramadol and Flexeril  ?

## 2019-01-26 ENCOUNTER — Ambulatory Visit (INDEPENDENT_AMBULATORY_CARE_PROVIDER_SITE_OTHER): Payer: PPO | Admitting: Orthopaedic Surgery

## 2019-02-24 ENCOUNTER — Telehealth: Payer: Self-pay | Admitting: Rheumatology

## 2019-02-24 NOTE — Telephone Encounter (Signed)
Patient called stating she has bilateral hand pain, redness, and itching.  Patient states the itching began 3 days ago.  Patient states her hands are not swollen.  Patient requested a return call before scheduling an appointment.

## 2019-02-24 NOTE — Telephone Encounter (Signed)
Patient states her hands and fingers are hurting and itching. Patient states if she is scratching she can't satisfy the itch. Patient states she does not have a rash. Patient states she had trouble with dry skin and has been using increased lotion. Patient states she has tried benadryl for the itching and that did not help with the itching at all. Patient states when she scratches she ends up with welps. Patient states she has a dermatologist. Patient advised to contact the dermatologist.

## 2019-02-26 ENCOUNTER — Other Ambulatory Visit: Payer: Self-pay | Admitting: Physician Assistant

## 2019-02-26 NOTE — Telephone Encounter (Signed)
Last Visit: 12/16/18 Next Visit: 06/16/19 UDS:08/25/18 Narc Agreement: 08/25/18  Okay to refill Tramadol

## 2019-02-26 NOTE — Telephone Encounter (Signed)
Please advise patient to return to the office to update UDS and narcotic agreement.

## 2019-02-27 ENCOUNTER — Other Ambulatory Visit: Payer: Self-pay | Admitting: *Deleted

## 2019-02-27 DIAGNOSIS — Z5181 Encounter for therapeutic drug level monitoring: Secondary | ICD-10-CM

## 2019-02-27 DIAGNOSIS — G8929 Other chronic pain: Secondary | ICD-10-CM

## 2019-02-27 NOTE — Telephone Encounter (Signed)
Attempted to contact the patient and left message for patient to call the office.  

## 2019-03-01 LAB — PAIN MGMT, PROFILE 5 W/CONF, U
Amphetamines: NEGATIVE ng/mL (ref <500)
Barbiturates: NEGATIVE ng/mL (ref <300)
Benzodiazepines: NEGATIVE ng/mL (ref <100)
Cocaine Metabolite: NEGATIVE ng/mL (ref <150)
Creatinine: 178.2 mg/dL
Marijuana Metabolite: NEGATIVE ng/mL (ref <20)
Methadone Metabolite: NEGATIVE ng/mL (ref <100)
Opiates: NEGATIVE ng/mL (ref <100)
Oxidant: NEGATIVE ug/mL (ref <200)
Oxycodone: NEGATIVE ng/mL (ref <100)
pH: 6.67 (ref 4.5–9.0)

## 2019-03-01 LAB — PAIN MGMT, TRAMADOL W/MEDMATCH, U
Desmethyltramadol: NEGATIVE ng/mL (ref <100)
Tramadol: NEGATIVE ng/mL (ref <100)

## 2019-03-02 NOTE — Progress Notes (Signed)
C/w

## 2019-03-28 ENCOUNTER — Other Ambulatory Visit: Payer: Self-pay | Admitting: Physician Assistant

## 2019-03-30 ENCOUNTER — Other Ambulatory Visit: Payer: Self-pay | Admitting: Physician Assistant

## 2019-03-30 NOTE — Telephone Encounter (Signed)
Last Visit: 12/16/2018 Next Visit: 06/16/2019  Okay to refill per Dr. Estanislado Pandy.

## 2019-03-30 NOTE — Telephone Encounter (Signed)
Last Visit: 12/16/2018 Next Visit: 06/16/2019 UDS:02/27/2019 c/w Narc Agreement: 02/27/2019  Last fill: 02/26/2019  Okay to refill tramadol?

## 2019-05-07 ENCOUNTER — Other Ambulatory Visit: Payer: Self-pay | Admitting: Physician Assistant

## 2019-05-07 NOTE — Telephone Encounter (Signed)
Last Visit: 12/16/2018 Next Visit: 06/16/2019 UDS: 03/16/19 Narc Agreement: 03/16/19  Okay to refill Tramadol and Ambien?

## 2019-05-29 NOTE — Progress Notes (Deleted)
Office Visit Note  Patient: [name]             Date of Birth: [birth year]           MRN: [id number]             PCP: Donald Prose, MD Referring: Donald Prose, MD Visit Date: 06/12/2019 Occupation: @GUAROCC @  Subjective:  No chief complaint on file.   History of Present Illness: [name] is a 48 y.o. female ***   Activities of Daily Living:  Patient reports morning stiffness for *** {minute/hour:19697}.   Patient {ACTIONS;DENIES/REPORTS:21021675::"Denies"} nocturnal pain.  Difficulty dressing/grooming: {ACTIONS;DENIES/REPORTS:21021675::"Denies"} Difficulty climbing stairs: {ACTIONS;DENIES/REPORTS:21021675::"Denies"} Difficulty getting out of chair: {ACTIONS;DENIES/REPORTS:21021675::"Denies"} Difficulty using hands for taps, buttons, cutlery, and/or writing: {ACTIONS;DENIES/REPORTS:21021675::"Denies"}  No Rheumatology ROS completed.   PMFS History:  Patient Active Problem List   Diagnosis Date Noted  . Fibromyalgia 12/25/2016  . Other fatigue 12/25/2016  . Primary insomnia 12/25/2016  . Primary osteoarthritis of both knees 12/25/2016  . ANA positive 12/25/2016  . History of gastroesophageal reflux (GERD) 12/25/2016  . History of anemia 12/25/2016  . Trochanteric bursitis of both hips 12/25/2016  . Anemia, iron deficiency 04/18/2015  . Dysphagia, pharyngoesophageal phase 11/14/2011  . Esophageal reflux 11/14/2011    Past Medical History:  Diagnosis Date  . ADD (attention deficit disorder with hyperactivity)   . Allergic rhinitis   . Anxiety   . Arthritis   . Depression   . Dysmenorrhea   . Fibromyalgia   . GERD (gastroesophageal reflux disease)   . Insomnia   . Irritable bowel syndrome   . Restless leg syndrome     Family History  Problem Relation Age of Onset  . Breast cancer Maternal Grandmother   . Diabetes Sister   . Heart disease Brother   . Stroke Mother   . Diabetes Sister   . Colon cancer Neg Hx   . Colon polyps Neg Hx    . Esophageal cancer Neg Hx   . Kidney disease Neg Hx   . Gallbladder disease Neg Hx   . Stomach cancer Neg Hx    Past Surgical History:  Procedure Laterality Date  . BUNIONECTOMY  2008   left  . DILATION AND CURETTAGE OF UTERUS  2004  . EAR CYST EXCISION  1983   Left   Social History   Social History Narrative  . Not on file    There is no immunization history on file for this patient.   Objective: Vital Signs: There were no vitals taken for this visit.   Physical Exam   Musculoskeletal Exam: ***  CDAI Exam: CDAI Score: - Patient Global: -; Provider Global: - Swollen: -; Tender: - Joint Exam   No joint exam has been documented for this visit   There is currently no information documented on the homunculus. Go to the Rheumatology activity and complete the homunculus joint exam.  Investigation: No additional findings.  Imaging: No results found.  Recent Labs: Lab Results  Component Value Date   WBC 6.4 11/15/2017   HGB 9.9 (L) 11/15/2017   PLT 332 11/15/2017   NA 139 11/15/2017   K 3.7 11/15/2017   CL 105 11/15/2017   CO2 26 11/15/2017   GLUCOSE 150 (H) 11/15/2017   BUN 11 11/15/2017   CREATININE 0.86 11/15/2017   BILITOT 0.4 11/15/2017   ALKPHOS 90 11/15/2017   AST 19 11/15/2017   ALT 15 11/15/2017   PROT 8.1 11/15/2017   ALBUMIN 3.9 11/15/2017  CALCIUM 8.8 (L) 11/15/2017   GFRAA >60 11/15/2017    Speciality Comments: No specialty comments available.  Procedures:  No procedures performed Allergies: Patient has no known allergies.   Assessment / Plan:     Visit Diagnoses: No diagnosis found.   Orders: No orders of the defined types were placed in this encounter.  No orders of the defined types were placed in this encounter.   Face-to-face time spent with patient was *** minutes. Greater than 50% of time was spent in counseling and coordination of care.  Follow-Up Instructions: No follow-ups on file.   Ofilia Neas, PA-C  Note  - This record has been created using Dragon software.  Chart creation errors have been sought, but may not always  have been located. Such creation errors do not reflect on  the standard of medical care.

## 2019-06-03 DIAGNOSIS — R062 Wheezing: Secondary | ICD-10-CM | POA: Diagnosis not present

## 2019-06-03 DIAGNOSIS — R0781 Pleurodynia: Secondary | ICD-10-CM | POA: Diagnosis not present

## 2019-06-12 ENCOUNTER — Ambulatory Visit: Payer: PPO | Admitting: Physician Assistant

## 2019-06-16 ENCOUNTER — Ambulatory Visit: Payer: Self-pay | Admitting: Physician Assistant

## 2019-06-17 DIAGNOSIS — R0681 Apnea, not elsewhere classified: Secondary | ICD-10-CM | POA: Diagnosis not present

## 2019-06-17 DIAGNOSIS — R0683 Snoring: Secondary | ICD-10-CM | POA: Diagnosis not present

## 2019-06-17 DIAGNOSIS — G2581 Restless legs syndrome: Secondary | ICD-10-CM | POA: Diagnosis not present

## 2019-06-17 DIAGNOSIS — G4700 Insomnia, unspecified: Secondary | ICD-10-CM | POA: Diagnosis not present

## 2019-06-17 DIAGNOSIS — D509 Iron deficiency anemia, unspecified: Secondary | ICD-10-CM | POA: Diagnosis not present

## 2019-06-24 DIAGNOSIS — R7303 Prediabetes: Secondary | ICD-10-CM | POA: Diagnosis not present

## 2019-06-24 DIAGNOSIS — Z136 Encounter for screening for cardiovascular disorders: Secondary | ICD-10-CM | POA: Diagnosis not present

## 2019-06-24 DIAGNOSIS — D509 Iron deficiency anemia, unspecified: Secondary | ICD-10-CM | POA: Diagnosis not present

## 2019-06-26 DIAGNOSIS — F411 Generalized anxiety disorder: Secondary | ICD-10-CM | POA: Diagnosis not present

## 2019-06-26 DIAGNOSIS — N3281 Overactive bladder: Secondary | ICD-10-CM | POA: Diagnosis not present

## 2019-06-26 DIAGNOSIS — Z1389 Encounter for screening for other disorder: Secondary | ICD-10-CM | POA: Diagnosis not present

## 2019-06-26 DIAGNOSIS — M797 Fibromyalgia: Secondary | ICD-10-CM | POA: Diagnosis not present

## 2019-06-26 DIAGNOSIS — E119 Type 2 diabetes mellitus without complications: Secondary | ICD-10-CM | POA: Diagnosis not present

## 2019-06-26 DIAGNOSIS — Z0000 Encounter for general adult medical examination without abnormal findings: Secondary | ICD-10-CM | POA: Diagnosis not present

## 2019-06-26 DIAGNOSIS — F900 Attention-deficit hyperactivity disorder, predominantly inattentive type: Secondary | ICD-10-CM | POA: Diagnosis not present

## 2019-06-28 DIAGNOSIS — G4733 Obstructive sleep apnea (adult) (pediatric): Secondary | ICD-10-CM | POA: Diagnosis not present

## 2019-06-29 DIAGNOSIS — D509 Iron deficiency anemia, unspecified: Secondary | ICD-10-CM | POA: Diagnosis not present

## 2019-06-29 DIAGNOSIS — G4733 Obstructive sleep apnea (adult) (pediatric): Secondary | ICD-10-CM | POA: Diagnosis not present

## 2019-06-29 DIAGNOSIS — G2581 Restless legs syndrome: Secondary | ICD-10-CM | POA: Diagnosis not present

## 2019-07-09 ENCOUNTER — Telehealth: Payer: Self-pay | Admitting: Rheumatology

## 2019-07-09 NOTE — Telephone Encounter (Signed)
Patient had labs done around July 8th. PCP was to send over results. Patient would like to know if we received them? Please call to advise.

## 2019-07-09 NOTE — Telephone Encounter (Signed)
Patient advised we do not have lab results from PCP. Patient will call and have them sent over.

## 2019-07-14 NOTE — Progress Notes (Signed)
Office Visit Note  Patient: [name]             Date of Birth: 1971-05-01           MRN: [id number]             PCP: Donald Prose, MD Referring: Donald Prose, MD Visit Date: 07/28/2019 Occupation: @GUAROCC @  Subjective:  RUQ pain   History of Present Illness: [name] is a 48 y.o. female with history of fibromyalgia and osteoarthritis.  She denies any recent fibromyalgia flares.  She states that her muscle aches and muscle tenderness have been manageable recently.  She continues to take tramadol 50 mg 1 to 2 tablets by mouth at bedtime as needed for pain relief.  She is been taking Flexeril 10 mg 1 tablet by mouth at bedtime as needed for muscle spasms.  She reports that she got tested for sleep apnea and started using a CPAP machine for the past 1 week.  She states that her fatigue and insomnia have improved since starting on the CPAP.  She continues to take Ambien 10 mg 1 tablet by mouth at bedtime as needed for insomnia.  She presents today with right upper quadrant and epigastric pain that started 2 to 3 days ago.  She has been experiencing constipation and nausea but has not had any vomiting or blood in her stool.  She states the pain is 10 out of 10 currently.  She states the pain is progressively been getting worse.  She denies any fevers or chills.  She has been taking Tylenol for pain relief.  She has a virtual visit with her PCP at 12 PM today.  She denies any sick contacts.  She continues to have a good appetite but the pain is worse after eating.  She reports that she still has her gallbladder.  She denies any recent NSAID use.  She states that she did start taking iron tablets 1 month ago.   Activities of Daily Living:  Patient reports morning stiffness for 1  hour.   Patient Denies nocturnal pain.  Difficulty dressing/grooming: Denies Difficulty climbing stairs: Denies Difficulty getting out of chair: Denies Difficulty using hands for taps, buttons,  cutlery, and/or writing: Denies  Review of Systems  Constitutional: Negative for fatigue.  HENT: Positive for mouth dryness. Negative for mouth sores and nose dryness.   Eyes: Negative for pain, visual disturbance and dryness.  Respiratory: Negative for cough, hemoptysis, shortness of breath and difficulty breathing.   Cardiovascular: Negative for chest pain, palpitations, hypertension and swelling in legs/feet.  Gastrointestinal: Positive for abdominal pain, constipation and nausea. Negative for blood in stool and diarrhea.  Endocrine: Negative for increased urination.  Genitourinary: Negative for painful urination.  Musculoskeletal: Negative for arthralgias, joint pain, joint swelling, myalgias, muscle weakness, morning stiffness, muscle tenderness and myalgias.  Skin: Negative for color change, pallor, rash, hair loss, nodules/bumps, skin tightness, ulcers and sensitivity to sunlight.  Allergic/Immunologic: Negative for susceptible to infections.  Neurological: Negative for dizziness, numbness, headaches and weakness.  Hematological: Negative for swollen glands.  Psychiatric/Behavioral: Negative for depressed mood and sleep disturbance. The patient is not nervous/anxious.     PMFS History:  Patient Active Problem List   Diagnosis Date Noted  . Fibromyalgia 12/25/2016  . Other fatigue 12/25/2016  . Primary insomnia 12/25/2016  . Primary osteoarthritis of both knees 12/25/2016  . ANA positive 12/25/2016  . History of gastroesophageal reflux (GERD) 12/25/2016  . History of anemia 12/25/2016  .  Trochanteric bursitis of both hips 12/25/2016  . Anemia, iron deficiency 04/18/2015  . Dysphagia, pharyngoesophageal phase 11/14/2011  . Esophageal reflux 11/14/2011    Past Medical History:  Diagnosis Date  . ADD (attention deficit disorder with hyperactivity)   . Allergic rhinitis   . Anxiety   . Arthritis   . Depression   . Dysmenorrhea   . Fibromyalgia   . GERD (gastroesophageal  reflux disease)   . Insomnia   . Irritable bowel syndrome   . Restless leg syndrome     Family History  Problem Relation Age of Onset  . Breast cancer Maternal Grandmother   . Diabetes Sister   . Heart disease Brother   . Stroke Mother   . Diabetes Sister   . Colon cancer Neg Hx   . Colon polyps Neg Hx   . Esophageal cancer Neg Hx   . Kidney disease Neg Hx   . Gallbladder disease Neg Hx   . Stomach cancer Neg Hx    Past Surgical History:  Procedure Laterality Date  . BUNIONECTOMY  2008   left  . DILATION AND CURETTAGE OF UTERUS  2004  . EAR CYST EXCISION  1983   Left   Social History   Social History Narrative  . Not on file    There is no immunization history on file for this patient.   Objective: Vital Signs: BP 129/85 (BP Location: Left Arm, Patient Position: Sitting, Cuff Size: Large)   Pulse 82   Resp 14   Ht 5\' 5"  (1.651 m)   Wt 241 lb 3.2 oz (109.4 kg)   BMI 40.14 kg/m    Physical Exam Vitals signs and nursing note reviewed.  Constitutional:      Appearance: She is well-developed.  HENT:     Head: Normocephalic and atraumatic.  Eyes:     Conjunctiva/sclera: Conjunctivae normal.  Neck:     Musculoskeletal: Normal range of motion.  Cardiovascular:     Rate and Rhythm: Normal rate and regular rhythm.     Heart sounds: Normal heart sounds.  Pulmonary:     Effort: Pulmonary effort is normal.     Breath sounds: Normal breath sounds.  Abdominal:     General: Bowel sounds are normal.     Palpations: Abdomen is soft.     Tenderness: There is abdominal tenderness.     Comments: Tenderness in the right upper quadrant and epigastric region.  Lymphadenopathy:     Cervical: No cervical adenopathy.  Skin:    General: Skin is warm and dry.     Capillary Refill: Capillary refill takes less than 2 seconds.  Neurological:     Mental Status: She is alert and oriented to person, place, and time.  Psychiatric:        Behavior: Behavior normal.       Musculoskeletal Exam: C-spine, thoracic spine, lumbar spine good range of motion.  No midline spinal tenderness.  No SI joint tenderness.  Shoulder joints, elbows, wrist joints, MCPs and PIPs, DIPs good range of motion no synovitis.  Hip joints, knee joints, ankle joints, MTPs, PIPs, DIPs good range of motion no synovitis.  No warmth or effusion of bilateral knee joints.  No tenderness or swelling of ankle joints.  No tenderness over trochanteric bursa bilaterally.  CDAI Exam: CDAI Score: - Patient Global: -; Provider Global: - Swollen: -; Tender: - Joint Exam   No joint exam has been documented for this visit   There is currently no information  documented on the homunculus. Go to the Rheumatology activity and complete the homunculus joint exam.  Investigation: No additional findings.  Imaging: No results found.  Recent Labs: Lab Results  Component Value Date   WBC 6.4 11/15/2017   HGB 9.9 (L) 11/15/2017   PLT 332 11/15/2017   NA 139 11/15/2017   K 3.7 11/15/2017   CL 105 11/15/2017   CO2 26 11/15/2017   GLUCOSE 150 (H) 11/15/2017   BUN 11 11/15/2017   CREATININE 0.86 11/15/2017   BILITOT 0.4 11/15/2017   ALKPHOS 90 11/15/2017   AST 19 11/15/2017   ALT 15 11/15/2017   PROT 8.1 11/15/2017   ALBUMIN 3.9 11/15/2017   CALCIUM 8.8 (L) 11/15/2017   GFRAA >60 11/15/2017    Speciality Comments: No specialty comments available.  Procedures:  No procedures performed Allergies: Patient has no known allergies.   Assessment / Plan:     Visit Diagnoses: Fibromyalgia - Her fibromyalgia pain has been manageable.  She has no generalizedmuscle tenderness at this time.  Her trochanter bursitis has resolved.  Her fatigue and insomnia have improved since starting to use a CPAP machine 1 week ago.  She continues to take tramadol 50 mg 2 tablets by mouth at bedtime as needed for pain relief.  She takes tramadol sparingly.  She takes Flexeril 10 mg 1 tablet by mouth at bedtime as needed for  muscle spasms.  She continues to take Ambien 10 mg by mouth at bedtime as needed for insomnia.  Refills of tramadol and Flexeril was sent to the pharmacy today.  She was encouraged to stay active and exercise on a basis.  She will follow-up in the office in 6 months.  Other fatigue - Her level fatigue has improved since starting to use a CPAP machine 1 week ago.   Primary insomnia - She started using a CPAP machine 1 week ago and has been sleeping better at night.  Her fatigue and daytime drowsiness has also improved.  She continues to take Ambien 10 mg 1 tablet by mouth at bedtime as needed for insomnia.    Primary osteoarthritis of both knees - She has good range of motion with no discomfort.  No warmth or effusion was noted.  Trochanteric bursitis of both hips - Resolved.  She has no tenderness on exam.  ANA positive -She has no clinical features of autoimmune disease.  Right upper quadrant abdominal tenderness without rebound tenderness -She presents today with right upper quadrant and epigastric tenderness.  No rebound tenderness was noted.  Her discomfort started 2 to 3 days ago.  She denies any sick contacts.  She has been experiencing constipation and nausea but has not had any vomiting or blood in her stool.  She denies any fevers or chills.  She continues to have a good appetite but states that the pain is worse after eating.  She has not taken any NSAIDs recently.  She was started on iron tablets 1 month ago.  She currently rates the pain a 10 out of 10.  She has been taking Tylenol as needed for pain relief.  She has a virtual visit scheduled with her PCP at 12 PM today.  She is followed by [facility] and was advised to call their office ASAP to see if they can fit her in for an appointment.  Other medical conditions are listed as follows:  Vitamin D deficiency    History of gastroesophageal reflux (GERD)   History of anemia   Gastroesophageal  reflux disease without esophagitis      Orders: No orders of the defined types were placed in this encounter.  Meds ordered this encounter  Medications  . cyclobenzaprine (FLEXERIL) 10 MG tablet    Sig: TAKE 1 TABLET BY MOUTH EVERYDAY AT BEDTIME AS NEEDED    Dispense:  90 tablet    Refill:  0  . traMADol (ULTRAM) 50 MG tablet    Sig: Take 2 tablets by mouth at bedtime as needed for pain relief.    Dispense:  60 tablet    Refill:  0    Not to exceed 4 additional fills before 09/27/2019      Follow-Up Instructions: Return in about 6 months (around 01/28/2020) for Fibromyalgia.   Ofilia Neas, PA-C  Note - This record has been created using Dragon software.  Chart creation errors have been sought, but may not always  have been located. Such creation errors do not reflect on  the standard of medical care.

## 2019-07-28 ENCOUNTER — Encounter: Payer: Self-pay | Admitting: Physician Assistant

## 2019-07-28 ENCOUNTER — Ambulatory Visit (INDEPENDENT_AMBULATORY_CARE_PROVIDER_SITE_OTHER): Payer: PPO | Admitting: Physician Assistant

## 2019-07-28 ENCOUNTER — Other Ambulatory Visit: Payer: Self-pay

## 2019-07-28 VITALS — BP 129/85 | HR 82 | Resp 14 | Ht 65.0 in | Wt 241.2 lb

## 2019-07-28 DIAGNOSIS — Z862 Personal history of diseases of the blood and blood-forming organs and certain disorders involving the immune mechanism: Secondary | ICD-10-CM

## 2019-07-28 DIAGNOSIS — Z8719 Personal history of other diseases of the digestive system: Secondary | ICD-10-CM | POA: Diagnosis not present

## 2019-07-28 DIAGNOSIS — F5101 Primary insomnia: Secondary | ICD-10-CM

## 2019-07-28 DIAGNOSIS — E559 Vitamin D deficiency, unspecified: Secondary | ICD-10-CM | POA: Diagnosis not present

## 2019-07-28 DIAGNOSIS — R1011 Right upper quadrant pain: Secondary | ICD-10-CM | POA: Diagnosis not present

## 2019-07-28 DIAGNOSIS — K219 Gastro-esophageal reflux disease without esophagitis: Secondary | ICD-10-CM

## 2019-07-28 DIAGNOSIS — R5383 Other fatigue: Secondary | ICD-10-CM

## 2019-07-28 DIAGNOSIS — M7062 Trochanteric bursitis, left hip: Secondary | ICD-10-CM

## 2019-07-28 DIAGNOSIS — R7689 Other specified abnormal immunological findings in serum: Secondary | ICD-10-CM

## 2019-07-28 DIAGNOSIS — F900 Attention-deficit hyperactivity disorder, predominantly inattentive type: Secondary | ICD-10-CM | POA: Diagnosis not present

## 2019-07-28 DIAGNOSIS — M7061 Trochanteric bursitis, right hip: Secondary | ICD-10-CM | POA: Diagnosis not present

## 2019-07-28 DIAGNOSIS — M797 Fibromyalgia: Secondary | ICD-10-CM | POA: Diagnosis not present

## 2019-07-28 DIAGNOSIS — R10811 Right upper quadrant abdominal tenderness: Secondary | ICD-10-CM

## 2019-07-28 DIAGNOSIS — Z7984 Long term (current) use of oral hypoglycemic drugs: Secondary | ICD-10-CM | POA: Diagnosis not present

## 2019-07-28 DIAGNOSIS — N3281 Overactive bladder: Secondary | ICD-10-CM | POA: Diagnosis not present

## 2019-07-28 DIAGNOSIS — R768 Other specified abnormal immunological findings in serum: Secondary | ICD-10-CM | POA: Diagnosis not present

## 2019-07-28 DIAGNOSIS — E1165 Type 2 diabetes mellitus with hyperglycemia: Secondary | ICD-10-CM | POA: Diagnosis not present

## 2019-07-28 DIAGNOSIS — M170 Bilateral primary osteoarthritis of knee: Secondary | ICD-10-CM | POA: Diagnosis not present

## 2019-07-28 MED ORDER — CYCLOBENZAPRINE HCL 10 MG PO TABS: ORAL_TABLET | ORAL | 0 refills | Status: DC

## 2019-07-28 MED ORDER — TRAMADOL HCL 50 MG PO TABS: ORAL_TABLET | ORAL | 0 refills | Status: DC

## 2019-07-29 DIAGNOSIS — E1165 Type 2 diabetes mellitus with hyperglycemia: Secondary | ICD-10-CM | POA: Diagnosis not present

## 2019-07-29 DIAGNOSIS — R1011 Right upper quadrant pain: Secondary | ICD-10-CM | POA: Diagnosis not present

## 2019-07-29 DIAGNOSIS — Z7984 Long term (current) use of oral hypoglycemic drugs: Secondary | ICD-10-CM | POA: Diagnosis not present

## 2019-07-31 ENCOUNTER — Other Ambulatory Visit: Payer: Self-pay | Admitting: Family Medicine

## 2019-07-31 DIAGNOSIS — R110 Nausea: Secondary | ICD-10-CM

## 2019-07-31 DIAGNOSIS — R1011 Right upper quadrant pain: Secondary | ICD-10-CM

## 2019-08-07 ENCOUNTER — Ambulatory Visit
Admission: RE | Admit: 2019-08-07 | Discharge: 2019-08-07 | Disposition: A | Discharge status: Home / Self Care | Payer: PPO | Source: Ambulatory Visit | Attending: Family Medicine | Admitting: Family Medicine

## 2019-08-07 DIAGNOSIS — R110 Nausea: Secondary | ICD-10-CM

## 2019-08-07 DIAGNOSIS — K760 Fatty (change of) liver, not elsewhere classified: Secondary | ICD-10-CM | POA: Diagnosis not present

## 2019-08-07 DIAGNOSIS — R1011 Right upper quadrant pain: Secondary | ICD-10-CM

## 2019-08-18 DIAGNOSIS — R1013 Epigastric pain: Secondary | ICD-10-CM | POA: Diagnosis not present

## 2019-08-18 DIAGNOSIS — R1011 Right upper quadrant pain: Secondary | ICD-10-CM | POA: Diagnosis not present

## 2019-08-20 ENCOUNTER — Other Ambulatory Visit: Payer: Self-pay | Admitting: Gastroenterology

## 2019-08-20 ENCOUNTER — Other Ambulatory Visit (HOSPITAL_COMMUNITY): Payer: Self-pay | Admitting: Gastroenterology

## 2019-08-20 DIAGNOSIS — R1011 Right upper quadrant pain: Secondary | ICD-10-CM

## 2019-08-20 DIAGNOSIS — R1013 Epigastric pain: Secondary | ICD-10-CM

## 2019-08-28 ENCOUNTER — Other Ambulatory Visit: Payer: PPO

## 2019-09-02 ENCOUNTER — Ambulatory Visit (HOSPITAL_COMMUNITY)
Admission: RE | Admit: 2019-09-02 | Discharge: 2019-09-02 | Disposition: A | Discharge status: Home / Self Care | Payer: PPO | Source: Ambulatory Visit | Attending: Gastroenterology | Admitting: Gastroenterology

## 2019-09-02 ENCOUNTER — Other Ambulatory Visit: Payer: Self-pay

## 2019-09-02 DIAGNOSIS — R1011 Right upper quadrant pain: Secondary | ICD-10-CM | POA: Diagnosis not present

## 2019-09-02 MED ORDER — TECHNETIUM TC 99M MEBROFENIN IV KIT
5.0400 | PACK | Freq: Once | INTRAVENOUS | Status: AC | PRN
  Administered 2019-09-02: 5.04 via INTRAVENOUS

## 2019-09-14 DIAGNOSIS — G4733 Obstructive sleep apnea (adult) (pediatric): Secondary | ICD-10-CM | POA: Diagnosis not present

## 2019-09-24 DIAGNOSIS — Z1159 Encounter for screening for other viral diseases: Secondary | ICD-10-CM | POA: Diagnosis not present

## 2019-09-29 DIAGNOSIS — R1013 Epigastric pain: Secondary | ICD-10-CM | POA: Diagnosis not present

## 2019-09-29 DIAGNOSIS — K317 Polyp of stomach and duodenum: Secondary | ICD-10-CM | POA: Diagnosis not present

## 2019-09-29 DIAGNOSIS — K2930 Chronic superficial gastritis without bleeding: Secondary | ICD-10-CM | POA: Diagnosis not present

## 2019-09-29 DIAGNOSIS — K2970 Gastritis, unspecified, without bleeding: Secondary | ICD-10-CM | POA: Diagnosis not present

## 2019-10-07 DIAGNOSIS — K2930 Chronic superficial gastritis without bleeding: Secondary | ICD-10-CM | POA: Diagnosis not present

## 2019-10-07 DIAGNOSIS — K317 Polyp of stomach and duodenum: Secondary | ICD-10-CM | POA: Diagnosis not present

## 2019-10-14 DIAGNOSIS — G4733 Obstructive sleep apnea (adult) (pediatric): Secondary | ICD-10-CM | POA: Diagnosis not present

## 2019-10-14 DIAGNOSIS — D509 Iron deficiency anemia, unspecified: Secondary | ICD-10-CM | POA: Diagnosis not present

## 2019-10-14 DIAGNOSIS — G2581 Restless legs syndrome: Secondary | ICD-10-CM | POA: Diagnosis not present

## 2019-11-11 ENCOUNTER — Other Ambulatory Visit: Payer: Self-pay | Admitting: Physician Assistant

## 2019-11-11 NOTE — Telephone Encounter (Signed)
Last Visit: 07/28/2019 Next Visit: 01/26/2020 UDS:02/27/2019 c/w Narc Agreement: 02/27/2019  Last fill Lorrin Mais: 05/07/2019 (30 tab, 2 refills) Last fill tramadol: 07/28/2019 (60 tab) Last fill flexeril: 07/28/2019 (90d)  Attempted to contact patient and left message on machine to advise patient she is due to update UDS (at a [facility]).   Okay to refill ambien, tramadol and flexeril?

## 2019-11-14 DIAGNOSIS — G4733 Obstructive sleep apnea (adult) (pediatric): Secondary | ICD-10-CM | POA: Diagnosis not present

## 2019-11-25 DIAGNOSIS — G4733 Obstructive sleep apnea (adult) (pediatric): Secondary | ICD-10-CM | POA: Diagnosis not present

## 2019-12-28 ENCOUNTER — Emergency Department (HOSPITAL_COMMUNITY): Payer: PPO

## 2019-12-28 ENCOUNTER — Emergency Department (HOSPITAL_COMMUNITY)
Admission: EM | Admit: 2019-12-28 | Discharge: 2019-12-28 | Disposition: A | Discharge status: Home / Self Care | Payer: PPO | Attending: Emergency Medicine | Admitting: Emergency Medicine

## 2019-12-28 ENCOUNTER — Other Ambulatory Visit: Payer: Self-pay

## 2019-12-28 DIAGNOSIS — R109 Unspecified abdominal pain: Secondary | ICD-10-CM | POA: Diagnosis not present

## 2019-12-28 DIAGNOSIS — Z20822 Contact with and (suspected) exposure to covid-19: Secondary | ICD-10-CM | POA: Diagnosis not present

## 2019-12-28 DIAGNOSIS — R739 Hyperglycemia, unspecified: Secondary | ICD-10-CM

## 2019-12-28 DIAGNOSIS — E1165 Type 2 diabetes mellitus with hyperglycemia: Secondary | ICD-10-CM | POA: Diagnosis not present

## 2019-12-28 DIAGNOSIS — Z7984 Long term (current) use of oral hypoglycemic drugs: Secondary | ICD-10-CM | POA: Insufficient documentation

## 2019-12-28 DIAGNOSIS — R000 Tachycardia, unspecified: Secondary | ICD-10-CM | POA: Diagnosis not present

## 2019-12-28 DIAGNOSIS — Z87891 Personal history of nicotine dependence: Secondary | ICD-10-CM | POA: Diagnosis not present

## 2019-12-28 DIAGNOSIS — R1033 Periumbilical pain: Secondary | ICD-10-CM | POA: Insufficient documentation

## 2019-12-28 DIAGNOSIS — Z79899 Other long term (current) drug therapy: Secondary | ICD-10-CM | POA: Insufficient documentation

## 2019-12-28 DIAGNOSIS — R1084 Generalized abdominal pain: Secondary | ICD-10-CM | POA: Diagnosis not present

## 2019-12-28 DIAGNOSIS — R52 Pain, unspecified: Secondary | ICD-10-CM | POA: Diagnosis not present

## 2019-12-28 DIAGNOSIS — R0602 Shortness of breath: Secondary | ICD-10-CM | POA: Diagnosis not present

## 2019-12-28 DIAGNOSIS — I10 Essential (primary) hypertension: Secondary | ICD-10-CM | POA: Diagnosis not present

## 2019-12-28 LAB — CBC WITH DIFFERENTIAL/PLATELET
Abs Immature Granulocytes: 0.03 10*3/uL (ref 0.00–0.07)
Basophils Absolute: 0.1 10*3/uL (ref 0.0–0.1)
Basophils Relative: 1 %
Eosinophils Absolute: 0.1 10*3/uL (ref 0.0–0.5)
Eosinophils Relative: 1 %
HCT: 43.7 % (ref 36.0–46.0)
Hemoglobin: 13.1 g/dL (ref 12.0–15.0)
Immature Granulocytes: 0 %
Lymphocytes Relative: 36 %
Lymphs Abs: 3 10*3/uL (ref 0.7–4.0)
MCH: 22.7 pg — ABNORMAL LOW (ref 26.0–34.0)
MCHC: 30 g/dL (ref 30.0–36.0)
MCV: 75.9 fL — ABNORMAL LOW (ref 80.0–100.0)
Monocytes Absolute: 0.4 10*3/uL (ref 0.1–1.0)
Monocytes Relative: 5 %
Neutro Abs: 4.8 10*3/uL (ref 1.7–7.7)
Neutrophils Relative %: 57 %
Platelets: 368 10*3/uL (ref 150–400)
RBC: 5.76 MIL/uL — ABNORMAL HIGH (ref 3.87–5.11)
RDW: 18.1 % — ABNORMAL HIGH (ref 11.5–15.5)
WBC: 8.4 10*3/uL (ref 4.0–10.5)
nRBC: 0 % (ref 0.0–0.2)

## 2019-12-28 LAB — URINALYSIS, ROUTINE W REFLEX MICROSCOPIC
Bilirubin Urine: NEGATIVE
Glucose, UA: >=500 mg/dL — AB
Ketones, ur: NEGATIVE mg/dL
Nitrite: NEGATIVE
Protein, ur: NEGATIVE mg/dL
Specific Gravity, Urine: 1.036 — ABNORMAL HIGH (ref 1.005–1.030)
pH: 5 (ref 5.0–8.0)

## 2019-12-28 LAB — BLOOD GAS, VENOUS
Acid-base deficit: 0.3 mmol/L (ref 0.0–2.0)
Bicarbonate: 24.6 mmol/L (ref 20.0–28.0)
O2 Saturation: 87.9 %
Patient temperature: 98.6
pCO2, Ven: 43.4 mmHg — ABNORMAL LOW (ref 44.0–60.0)
pH, Ven: 7.371 (ref 7.250–7.430)
pO2, Ven: 58.7 mmHg — ABNORMAL HIGH (ref 32.0–45.0)

## 2019-12-28 LAB — COMPREHENSIVE METABOLIC PANEL
ALT: 41 U/L (ref 0–44)
AST: 24 U/L (ref 15–41)
Albumin: 4.1 g/dL (ref 3.5–5.0)
Alkaline Phosphatase: 173 U/L — ABNORMAL HIGH (ref 38–126)
Anion gap: 11 (ref 5–15)
BUN: 11 mg/dL (ref 6–20)
CO2: 24 mmol/L (ref 22–32)
Calcium: 9 mg/dL (ref 8.9–10.3)
Chloride: 97 mmol/L — ABNORMAL LOW (ref 98–111)
Creatinine, Ser: 0.89 mg/dL (ref 0.44–1.00)
GFR calc Af Amer: >60 mL/min (ref >60)
GFR calc non Af Amer: >60 mL/min (ref >60)
Glucose, Bld: 593 mg/dL (ref 70–99)
Potassium: 3.7 mmol/L (ref 3.5–5.1)
Sodium: 132 mmol/L — ABNORMAL LOW (ref 135–145)
Total Bilirubin: 0.5 mg/dL (ref 0.3–1.2)
Total Protein: 8.7 g/dL — ABNORMAL HIGH (ref 6.5–8.1)

## 2019-12-28 LAB — I-STAT CHEM 8, ED
BUN: 10 mg/dL (ref 6–20)
BUN: 9 mg/dL (ref 6–20)
Calcium, Ion: 1.03 mmol/L — ABNORMAL LOW (ref 1.15–1.40)
Calcium, Ion: 1.11 mmol/L — ABNORMAL LOW (ref 1.15–1.40)
Chloride: 100 mmol/L (ref 98–111)
Chloride: 100 mmol/L (ref 98–111)
Creatinine, Ser: 0.7 mg/dL (ref 0.44–1.00)
Creatinine, Ser: 0.6 mg/dL (ref 0.44–1.00)
Glucose, Bld: 619 mg/dL (ref 70–99)
Glucose, Bld: 519 mg/dL (ref 70–99)
HCT: 45 % (ref 36.0–46.0)
HCT: 39 % (ref 36.0–46.0)
Hemoglobin: 15.3 g/dL — ABNORMAL HIGH (ref 12.0–15.0)
Hemoglobin: 13.3 g/dL (ref 12.0–15.0)
Potassium: 3.8 mmol/L (ref 3.5–5.1)
Potassium: 3.9 mmol/L (ref 3.5–5.1)
Sodium: 134 mmol/L — ABNORMAL LOW (ref 135–145)
Sodium: 137 mmol/L (ref 135–145)
TCO2: 25 mmol/L (ref 22–32)
TCO2: 27 mmol/L (ref 22–32)

## 2019-12-28 LAB — POC SARS CORONAVIRUS 2 AG -  ED: SARS Coronavirus 2 Ag: NEGATIVE

## 2019-12-28 LAB — I-STAT BETA HCG BLOOD, ED (MC, WL, AP ONLY): I-stat hCG, quantitative: <5 m[IU]/mL (ref <5)

## 2019-12-28 LAB — CBG MONITORING, ED
Glucose-Capillary: 595 mg/dL (ref 70–99)
Glucose-Capillary: 469 mg/dL — ABNORMAL HIGH (ref 70–99)

## 2019-12-28 LAB — LIPASE, BLOOD: Lipase: 42 U/L (ref 11–51)

## 2019-12-28 MED ORDER — SODIUM CHLORIDE 0.9 % IV BOLUS
1000.0000 mL | Freq: Once | INTRAVENOUS | Status: AC
  Administered 2019-12-28: 1000 mL via INTRAVENOUS

## 2019-12-28 MED ORDER — TRAMADOL HCL 50 MG PO TABS: 50.0000 mg | ORAL_TABLET | Freq: Once | ORAL | Status: DC

## 2019-12-28 MED ORDER — IOHEXOL 300 MG/ML  SOLN
100.0000 mL | Freq: Once | INTRAMUSCULAR | Status: AC | PRN
  Administered 2019-12-28: 100 mL via INTRAVENOUS

## 2019-12-28 MED ORDER — DICYCLOMINE HCL 20 MG PO TABS: 20.0000 mg | ORAL_TABLET | Freq: Two times a day (BID) | ORAL | 0 refills | Status: DC

## 2019-12-28 MED ORDER — INSULIN ASPART 100 UNIT/ML ~~LOC~~ SOLN
10.0000 [IU] | Freq: Once | SUBCUTANEOUS | Status: AC
  Administered 2019-12-28: 10 [IU] via INTRAVENOUS
  Filled 2019-12-28: qty 0.1

## 2019-12-28 MED ORDER — ONDANSETRON 8 MG PO TBDP: 8.0000 mg | ORAL_TABLET | Freq: Three times a day (TID) | ORAL | 0 refills | Status: DC | PRN

## 2019-12-28 MED ORDER — ONDANSETRON HCL 4 MG/2ML IJ SOLN
4.0000 mg | Freq: Once | INTRAMUSCULAR | Status: AC
  Administered 2019-12-28: 4 mg via INTRAVENOUS
  Filled 2019-12-28: qty 2

## 2019-12-28 MED ORDER — DICYCLOMINE HCL 10 MG PO CAPS
20.0000 mg | ORAL_CAPSULE | Freq: Once | ORAL | Status: AC
  Administered 2019-12-28: 20 mg via ORAL
  Filled 2019-12-28: qty 2

## 2019-12-28 MED ORDER — SODIUM CHLORIDE (PF) 0.9 % IJ SOLN
INTRAMUSCULAR | Status: AC
  Filled 2019-12-28: qty 50

## 2019-12-28 NOTE — ED Notes (Signed)
Pt ambulatory to RR without assistance  

## 2019-12-28 NOTE — ED Notes (Signed)
X-ray at bedside

## 2019-12-28 NOTE — ED Triage Notes (Signed)
Pt arrived via [facility] from home CC hyperglycemia, and abd pain. Pt reports home meter [phone]. Pt is on metformin. Per EMS A/OX4  Hx diabetes, sleep apnea

## 2019-12-28 NOTE — ED Provider Notes (Signed)
[facility] DEPT Provider Note   CSN: [id number] Arrival date & time: 12/28/19  1431     History Chief Complaint  Patient presents with  . Hyperglycemia    [name] is a 49 y.o. female.  HPI      Olivia Popal Goodman is a 49 y.o. female, with a history of fibromyalgia, DM, IBS, GERD, presenting to the ED with nausea and vomiting for the last 5 days.  Also notes 2 weeks of polyuria and polydipsia, though she states her blood sugars did not rise above 200 until last several days.  CBG at home over 500 today.  Occasional shortness of breath today. She endorses some intermittent epigastric and periumbilical abdominal discomfort that she states is consistent with exacerbation of fibromyalgia.  This has been present for the last several days, intermittent, worse with vomiting. One episode of soft stool today, but otherwise BMs normal. Patient states she does newly diagnosed with diabetes September 2020 and placed on Metformin once daily, which she adds has been previously controlling her blood sugars adequately.  She last took her Metformin last night.  Denies fever/chills, cough, chest pain, orthopnea, lower extremity edema or pain, hematochezia/melena, dysuria, hematuria, or any other complaints.   Past Medical History:  Diagnosis Date  . ADD (attention deficit disorder with hyperactivity)   . Allergic rhinitis   . Anxiety   . Arthritis   . Depression   . Dysmenorrhea   . Fibromyalgia   . GERD (gastroesophageal reflux disease)   . Insomnia   . Irritable bowel syndrome   . Restless leg syndrome     Patient Active Problem List   Diagnosis Date Noted  . Fibromyalgia 12/25/2016  . Other fatigue 12/25/2016  . Primary insomnia 12/25/2016  . Primary osteoarthritis of both knees 12/25/2016  . ANA positive 12/25/2016  . History of gastroesophageal reflux (GERD) 12/25/2016  . History of anemia 12/25/2016  . Trochanteric bursitis of  both hips 12/25/2016  . Anemia, iron deficiency 04/18/2015  . Dysphagia, pharyngoesophageal phase 11/14/2011  . Esophageal reflux 11/14/2011    Past Surgical History:  Procedure Laterality Date  . BUNIONECTOMY  2008   left  . DILATION AND CURETTAGE OF UTERUS  2004  . EAR CYST EXCISION  1983   Left     OB History    Gravida  2   Para  1   Term  1   Preterm  0   AB  1   Living  1     SAB  1   TAB      Ectopic      Multiple      Live Births  1           Family History  Problem Relation Age of Onset  . Breast cancer Maternal Grandmother   . Diabetes Sister   . Heart disease Brother   . Stroke Mother   . Diabetes Sister   . Colon cancer Neg Hx   . Colon polyps Neg Hx   . Esophageal cancer Neg Hx   . Kidney disease Neg Hx   . Gallbladder disease Neg Hx   . Stomach cancer Neg Hx     Social History   Tobacco Use  . Smoking status: Former Smoker    Packs/day: 0.10    Years: 12.00    Pack years: 1.20    Types: Cigarettes    Quit date: 12/27/1993    Years since quitting: 26.0  .  Smokeless tobacco: Never Used  Substance Use Topics  . Alcohol use: No  . Drug use: No    Home Medications Prior to Admission medications   Medication Sig Start Date End Date Taking? Authorizing Provider  cyclobenzaprine (FLEXERIL) 10 MG tablet TAKE 1 TABLET BY MOUTH EVERYDAY AT BEDTIME AS NEEDED Patient taking differently: Take 10 mg by mouth at bedtime as needed for muscle spasms.  11/11/19  Yes Deveshwar, Abel Presto, MD  escitalopram (LEXAPRO) 20 MG tablet Take 20 mg by mouth at bedtime.  11/11/19  Yes [provider]  gabapentin (NEURONTIN) 300 MG capsule Take 600 mg by mouth at bedtime.  10/10/16  Yes [provider]  metFORMIN (GLUCOPHAGE-XR) 500 MG 24 hr tablet Take 500 mg by mouth every evening.  06/26/19  Yes [provider]  omeprazole (PRILOSEC) 40 MG capsule TAKE 1 CAPSULE BY MOUTH EVERY DAY Patient taking differently: Take 40 mg by  mouth daily.  01/27/18  Yes Danis, Kirke Corin, MD  oxybutynin (DITROPAN) 5 MG tablet Take 5 mg by mouth 2 (two) times daily.  07/27/19  Yes [provider]  traMADol (ULTRAM) 50 MG tablet TAKE 2 TABLETS BY MOUTH EVERY DAY AT BEDTIME AS NEEDED FOR PAIN Patient taking differently: Take 100 mg by mouth at bedtime as needed for moderate pain.  11/11/19  Yes Deveshwar, Abel Presto, MD  VOLTAREN 1 % GEL 3 grams tid Patient taking differently: Apply 3 g topically daily as needed (for pain).  12/26/16  Yes Deveshwar, Abel Presto, MD  zolpidem (AMBIEN) 10 MG tablet TAKE 1 TABLET BY MOUTH EVERY EVENING AT BEDTIME AS NEEDED FOR SLEEP Patient taking differently: Take 10 mg by mouth at bedtime as needed for sleep.  11/11/19  Yes Deveshwar, Abel Presto, MD  dicyclomine (BENTYL) 20 MG tablet Take 1 tablet (20 mg total) by mouth 2 (two) times daily. 12/28/19   Treyana Sturgell C, PA-C  ondansetron (ZOFRAN ODT) 8 MG disintegrating tablet Take 1 tablet (8 mg total) by mouth every 8 (eight) hours as needed for nausea or vomiting. 12/28/19   Sunaina Ferrando C, PA-C    Allergies    Patient has no known allergies.  Review of Systems   Review of Systems  Constitutional: Negative for chills, diaphoresis and fever.  Respiratory: Positive for shortness of breath. Negative for cough.   Cardiovascular: Negative for chest pain and leg swelling.  Gastrointestinal: Positive for abdominal pain, nausea and vomiting. Negative for blood in stool and diarrhea.  Endocrine: Positive for polydipsia and polyuria.  Genitourinary: Negative for dysuria, flank pain and hematuria.  Musculoskeletal: Negative for back pain.  Neurological: Positive for light-headedness. Negative for syncope and weakness.  All other systems reviewed and are negative.   Physical Exam Updated Vital Signs BP (!) 153/95   Pulse (!) 118   Temp 98 F (36.7 C)   Resp 18   SpO2 100%   Physical Exam Vitals and nursing note reviewed.  Constitutional:      General: She is  not in acute distress.    Appearance: She is well-developed. She is obese. She is not diaphoretic.  HENT:     Head: Normocephalic and atraumatic.     Mouth/Throat:     Mouth: Mucous membranes are dry.     Pharynx: Oropharynx is clear.  Eyes:     Conjunctiva/sclera: Conjunctivae normal.  Cardiovascular:     Rate and Rhythm: Regular rhythm. Tachycardia present.     Pulses: Normal pulses.  Radial pulses are 2+ on the right side and 2+ on the left side.       Posterior tibial pulses are 2+ on the right side and 2+ on the left side.     Heart sounds: Normal heart sounds.     Comments: Tactile temperature in the extremities appropriate and equal bilaterally. Pulmonary:     Effort: Pulmonary effort is normal. No respiratory distress.     Breath sounds: Normal breath sounds.     Comments: No increased work of breathing.  Speaks in full sentences without difficulty. Abdominal:     Palpations: Abdomen is soft.     Tenderness: There is no abdominal tenderness. There is no guarding.  Musculoskeletal:     Cervical back: Neck supple.     Right lower leg: No edema.     Left lower leg: No edema.  Lymphadenopathy:     Cervical: No cervical adenopathy.  Skin:    General: Skin is warm and dry.  Neurological:     Mental Status: She is alert.  Psychiatric:        Mood and Affect: Mood and affect normal.        Speech: Speech normal.        Behavior: Behavior normal.     ED Results / Procedures / Treatments   Labs (all labs ordered are listed, but only abnormal results are displayed) Labs Reviewed  COMPREHENSIVE METABOLIC PANEL - Abnormal; Notable for the following components:      Result Value   Sodium 132 (*)    Chloride 97 (*)    Glucose, Bld 593 (*)    Total Protein 8.7 (*)    Alkaline Phosphatase 173 (*)    All other components within normal limits  CBC WITH DIFFERENTIAL/PLATELET - Abnormal; Notable for the following components:   RBC 5.76 (*)    MCV 75.9 (*)    MCH 22.7  (*)    RDW 18.1 (*)    All other components within normal limits  URINALYSIS, ROUTINE W REFLEX MICROSCOPIC - Abnormal; Notable for the following components:   Color, Urine STRAW (*)    APPearance HAZY (*)    Specific Gravity, Urine 1.036 (*)    Glucose, UA >=500 (*)    Hgb urine dipstick SMALL (*)    Leukocytes,Ua MODERATE (*)    Bacteria, UA RARE (*)    Crystals PRESENT (*)    All other components within normal limits  BLOOD GAS, VENOUS - Abnormal; Notable for the following components:   pCO2, Ven 43.4 (*)    pO2, Ven 58.7 (*)    All other components within normal limits  I-STAT CHEM 8, ED - Abnormal; Notable for the following components:   Glucose, Bld 519 (*)    Calcium, Ion 1.11 (*)    All other components within normal limits  CBG MONITORING, ED - Abnormal; Notable for the following components:   Glucose-Capillary 595 (*)    All other components within normal limits  CBG MONITORING, ED - Abnormal; Notable for the following components:   Glucose-Capillary 469 (*)    All other components within normal limits  I-STAT CHEM 8, ED - Abnormal; Notable for the following components:   Sodium 134 (*)    Glucose, Bld 619 (*)    Calcium, Ion 1.03 (*)    Hemoglobin 15.3 (*)    All other components within normal limits  NOVEL CORONAVIRUS, NAA (HOSP ORDER, SEND-OUT TO REF LAB; TAT 18-24 HRS)  LIPASE, BLOOD  I-STAT BETA HCG BLOOD, ED ([facility], [facility], [facility] ONLY)  POC SARS CORONAVIRUS 2 AG -  ED    EKG None  Radiology CT ABDOMEN PELVIS W CONTRAST  Result Date: 12/28/2019 CLINICAL DATA:  Periumbilical and lower abdominal pain EXAM: CT ABDOMEN AND PELVIS WITH CONTRAST TECHNIQUE: Multidetector CT imaging of the abdomen and pelvis was performed using the standard protocol following bolus administration of intravenous contrast. CONTRAST:  163mL OMNIPAQUE IOHEXOL 300 MG/ML  SOLN COMPARISON:  07/25/2010 FINDINGS: Lower chest: Lung bases are clear. No effusions. Heart is normal size. Hepatobiliary:  Diffuse low-density throughout the liver compatible with fatty infiltration. No focal abnormality. Gallbladder unremarkable. Pancreas: No focal abnormality or ductal dilatation. Spleen: No focal abnormality.  Normal size. Adrenals/Urinary Tract: No adrenal abnormality. No focal renal abnormality. No stones or hydronephrosis. Urinary bladder is unremarkable. Stomach/Bowel: Normal appendix. Stomach, large and small bowel grossly unremarkable. Vascular/Lymphatic: No evidence of aneurysm or adenopathy. Reproductive: Next Uterus and adnexa unremarkable.  No mass. Other: No free fluid or free air. Small umbilical hernia containing fat. Musculoskeletal: No acute bony abnormality. IMPRESSION: Fatty infiltration of the liver. Normal appendix. Small umbilical hernia containing fat, stable since 2011. Electronically Signed   By: Rolm Baptise M.D.   On: 12/28/2019 18:25   DG Chest Portable 1 View  Result Date: 12/28/2019 CLINICAL DATA:  Shortness of breath.  Hyperglycemia. EXAM: PORTABLE CHEST 1 VIEW COMPARISON:  05/20/2017 FINDINGS: The heart size and mediastinal contours are within normal limits. Both lungs are clear. Mild thoracic spondylosis. No blunting of the costophrenic angles. IMPRESSION: No active disease. Electronically Signed   By: Van Clines M.D.   On: 12/28/2019 16:07    Procedures Procedures (including critical care time)  Medications Ordered in ED Medications  sodium chloride (PF) 0.9 % injection (has no administration in time range)  sodium chloride 0.9 % bolus 1,000 mL (0 mLs Intravenous Stopped 12/28/19 1731)  ondansetron (ZOFRAN) injection 4 mg (4 mg Intravenous Given 12/28/19 1548)  insulin aspart (novoLOG) injection 10 Units (10 Units Intravenous Given 12/28/19 1736)  iohexol (OMNIPAQUE) 300 MG/ML solution 100 mL (100 mLs Intravenous Contrast Given 12/28/19 1807)  dicyclomine (BENTYL) capsule 20 mg (20 mg Oral Given 12/28/19 1854)  sodium chloride 0.9 % bolus 1,000 mL (0 mLs  Intravenous Stopped 12/28/19 2003)  ondansetron (ZOFRAN) injection 4 mg (4 mg Intravenous Given 12/28/19 1854)    ED Course  I have reviewed the triage vital signs and the nursing notes.  Pertinent labs & imaging results that were available during my care of the patient were reviewed by me and considered in my medical decision making (see chart for details).  Clinical Course as of Dec 28 2051  Molli Knock Dec 28, 2019  2040 Patient states she feels much better.  Abdominal pain resolved after Bentyl.  Nausea resolved.  Tolerating PO.  Pulse rate 98.   [SJ]    Clinical Course User Index [SJ] Ortencia Askari, Helane Gunther, PA-C   MDM Rules/Calculators/A&P                       Patient presents with nausea and vomiting over the last several days as well as concerns for high blood sugar. Patient's blood glucose improved.  No evidence of DKA.  Evidence of dehydration noted and addressed with IV fluids. No acute abnormality on CT abdomen/pelvis or chest x-ray. Tolerating PO at time of discharge. The patient was given instructions for home care as well as return precautions. Patient voices understanding of  these instructions, accepts the plan, and is comfortable with discharge.  Findings and plan of care discussed with Hal Neer, MD. Dr. Roslynn Amble personally evaluated and examined this patient.  Final Clinical Impression(s) / ED Diagnoses Final diagnoses:  Hyperglycemia    Rx / DC Orders ED Discharge Orders         Ordered    dicyclomine (BENTYL) 20 MG tablet  2 times daily     12/28/19 2051    ondansetron (ZOFRAN ODT) 8 MG disintegrating tablet  Every 8 hours PRN     12/28/19 2051           Layla Maw 12/28/19 2055    Lucrezia Starch, MD 12/29/19 [phone]

## 2019-12-28 NOTE — ED Notes (Signed)
Pt verbalizes understanding of DC instructions. Pt belongings returned and is ambulatory out of ED.  

## 2019-12-28 NOTE — Discharge Instructions (Signed)
Please follow-up with your primary care provider regarding your high blood sugar. Continue to drink fluids to stay well-hydrated. Dicyclomine: Dicyclomine (generic for Bentyl) is what is known as an antispasmodic and is intended to help reduce abdominal discomfort. Nausea/vomiting: Use the ondansetron (generic for Zofran) for nausea or vomiting.  This medication may not prevent all vomiting or nausea, but can help facilitate better hydration. Things that can help with nausea/vomiting also include peppermint/menthol candies, vitamin B12, and ginger. Return: Return to the emergency department for worsening pain, uncontrolled vomiting, chest pain, shortness of breath, or any other major concerns.

## 2019-12-28 NOTE — ED Notes (Signed)
Pt ambulatory to RR independently  

## 2019-12-28 NOTE — ED Notes (Signed)
Pt provided water.  

## 2019-12-30 LAB — NOVEL CORONAVIRUS, NAA (HOSP ORDER, SEND-OUT TO REF LAB; TAT 18-24 HRS): SARS-CoV-2, NAA: NOT DETECTED

## 2020-01-01 DIAGNOSIS — B373 Candidiasis of vulva and vagina: Secondary | ICD-10-CM | POA: Diagnosis not present

## 2020-01-01 DIAGNOSIS — Z794 Long term (current) use of insulin: Secondary | ICD-10-CM | POA: Diagnosis not present

## 2020-01-01 DIAGNOSIS — E1165 Type 2 diabetes mellitus with hyperglycemia: Secondary | ICD-10-CM | POA: Diagnosis not present

## 2020-01-18 NOTE — Progress Notes (Signed)
Office Visit Note  Patient: [name]             Date of Birth: [birth year]           MRN: [id number]             PCP: Donald Prose, MD Referring: Donald Prose, MD Visit Date: 01/26/2020 Occupation: @GUAROCC @  Subjective:  Pain in bilateral hips.   History of Present Illness: [name] is a 49 y.o. female with history of fibromyalgia syndrome.  She states she was recently hospitalized due to elevated blood glucose.  She was placed on Metformin and Lantus insulin.  Her blood glucose levels are improving but is still significantly high.  She states she has been taking Flexeril 10 mg p.o. nightly and tramadol 2 tablets at bedtime along with Ambien 10 mg p.o. nightly.  She states she is still not sleeping well.  She continues to have some generalized pain and pain in her bilateral trochanteric bursa.  Also has some discomfort in her hands.  Activities of Daily Living:  Patient reports morning stiffness for 24 hours.   Patient Reports nocturnal pain.  Difficulty dressing/grooming: Denies Difficulty climbing stairs: Denies Difficulty getting out of chair: Denies Difficulty using hands for taps, buttons, cutlery, and/or writing: Reports  Review of Systems  Constitutional: Positive for fatigue. Negative for night sweats, weight gain and weight loss.  HENT: Positive for mouth dryness and nose dryness. Negative for mouth sores, trouble swallowing and trouble swallowing.        Nose sores  Eyes: Negative for pain, redness, visual disturbance and dryness.  Respiratory: Negative for cough, shortness of breath, wheezing and difficulty breathing.   Cardiovascular: Negative for chest pain, palpitations, hypertension, irregular heartbeat and swelling in legs/feet.  Gastrointestinal: Negative for blood in stool, constipation and diarrhea.  Endocrine: Negative for increased urination.  Genitourinary: Negative for difficulty urinating, painful urination and vaginal dryness.    Musculoskeletal: Positive for arthralgias, joint pain and morning stiffness. Negative for joint swelling, myalgias, muscle weakness, muscle tenderness and myalgias.  Skin: Negative for color change, rash, hair loss, skin tightness, ulcers and sensitivity to sunlight.  Allergic/Immunologic: Negative for susceptible to infections.  Neurological: Positive for headaches. Negative for dizziness, memory loss, night sweats and weakness.  Hematological: Negative for bruising/bleeding tendency and swollen glands.  Psychiatric/Behavioral: Positive for sleep disturbance. Negative for depressed mood and confusion. The patient is not nervous/anxious.     PMFS History:  Patient Active Problem List   Diagnosis Date Noted  . Fibromyalgia 12/25/2016  . Other fatigue 12/25/2016  . Primary insomnia 12/25/2016  . Primary osteoarthritis of both knees 12/25/2016  . ANA positive 12/25/2016  . History of gastroesophageal reflux (GERD) 12/25/2016  . History of anemia 12/25/2016  . Trochanteric bursitis of both hips 12/25/2016  . Anemia, iron deficiency 04/18/2015  . Dysphagia, pharyngoesophageal phase 11/14/2011  . Esophageal reflux 11/14/2011    Past Medical History:  Diagnosis Date  . ADD (attention deficit disorder with hyperactivity)   . Allergic rhinitis   . Anxiety   . Arthritis   . Depression   . Dysmenorrhea   . Fibromyalgia   . GERD (gastroesophageal reflux disease)   . Insomnia   . Irritable bowel syndrome   . Restless leg syndrome     Family History  Problem Relation Age of Onset  . Breast cancer Maternal Grandmother   . Diabetes Sister   . Heart disease Brother   . Heart attack Brother   .  Stroke Mother   . Diabetes Sister   . Colon cancer Neg Hx   . Colon polyps Neg Hx   . Esophageal cancer Neg Hx   . Kidney disease Neg Hx   . Gallbladder disease Neg Hx   . Stomach cancer Neg Hx    Past Surgical History:  Procedure Laterality Date  . BUNIONECTOMY  2008   left  . DILATION  AND CURETTAGE OF UTERUS  2004  . EAR CYST EXCISION  1983   Left  . KNEE SURGERY Right    Social History   Social History Narrative  . Not on file    There is no immunization history on file for this patient.   Objective: Vital Signs: BP (!) 143/101 (BP Location: Left Arm, Patient Position: Sitting, Cuff Size: Normal)   Pulse (!) 113   Resp 15   Ht 5\' 5"  (1.651 m)   Wt 231 lb 12.8 oz (105.1 kg)   BMI 38.57 kg/m    Physical Exam Vitals and nursing note reviewed.  Constitutional:      Appearance: She is well-developed.  HENT:     Head: Normocephalic and atraumatic.  Eyes:     Conjunctiva/sclera: Conjunctivae normal.  Cardiovascular:     Rate and Rhythm: Normal rate and regular rhythm.     Heart sounds: Normal heart sounds.  Pulmonary:     Effort: Pulmonary effort is normal.     Breath sounds: Normal breath sounds.  Abdominal:     General: Bowel sounds are normal.     Palpations: Abdomen is soft.  Musculoskeletal:     Cervical back: Normal range of motion.  Lymphadenopathy:     Cervical: No cervical adenopathy.  Skin:    General: Skin is warm and dry.     Capillary Refill: Capillary refill takes less than 2 seconds.  Neurological:     Mental Status: She is alert and oriented to person, place, and time.  Psychiatric:        Behavior: Behavior normal.      Musculoskeletal Exam: C-spine thoracic and lumbar spine with good range of motion.  Shoulder joints elbow joints wrist joint MCPs PIPs DIPs with good range of motion.  Hip joints knee joints ankles MTPs PIPs with good range of motion.  She has tenderness of bilateral trapezius area bilateral lateral epicondyle and bilateral trochanteric bursa.  She also has generalized hyperalgesia.  CDAI Exam: CDAI Score: -- Patient Global: --; Provider Global: -- Swollen: --; Tender: -- Joint Exam 01/26/2020   No joint exam has been documented for this visit   There is currently no information documented on the homunculus.  Go to the Rheumatology activity and complete the homunculus joint exam.  Investigation: No additional findings.  Imaging: CT ABDOMEN PELVIS W CONTRAST  Result Date: 12/28/2019 CLINICAL DATA:  Periumbilical and lower abdominal pain EXAM: CT ABDOMEN AND PELVIS WITH CONTRAST TECHNIQUE: Multidetector CT imaging of the abdomen and pelvis was performed using the standard protocol following bolus administration of intravenous contrast. CONTRAST:  141mL OMNIPAQUE IOHEXOL 300 MG/ML  SOLN COMPARISON:  07/25/2010 FINDINGS: Lower chest: Lung bases are clear. No effusions. Heart is normal size. Hepatobiliary: Diffuse low-density throughout the liver compatible with fatty infiltration. No focal abnormality. Gallbladder unremarkable. Pancreas: No focal abnormality or ductal dilatation. Spleen: No focal abnormality.  Normal size. Adrenals/Urinary Tract: No adrenal abnormality. No focal renal abnormality. No stones or hydronephrosis. Urinary bladder is unremarkable. Stomach/Bowel: Normal appendix. Stomach, large and small bowel grossly unremarkable. Vascular/Lymphatic: No  evidence of aneurysm or adenopathy. Reproductive: Next Uterus and adnexa unremarkable.  No mass. Other: No free fluid or free air. Small umbilical hernia containing fat. Musculoskeletal: No acute bony abnormality. IMPRESSION: Fatty infiltration of the liver. Normal appendix. Small umbilical hernia containing fat, stable since 2011. Electronically Signed   By: Rolm Baptise M.D.   On: 12/28/2019 18:25   DG Chest Portable 1 View  Result Date: 12/28/2019 CLINICAL DATA:  Shortness of breath.  Hyperglycemia. EXAM: PORTABLE CHEST 1 VIEW COMPARISON:  05/20/2017 FINDINGS: The heart size and mediastinal contours are within normal limits. Both lungs are clear. Mild thoracic spondylosis. No blunting of the costophrenic angles. IMPRESSION: No active disease. Electronically Signed   By: Van Clines M.D.   On: 12/28/2019 16:07    Recent Labs: Lab Results   Component Value Date   WBC 8.4 12/28/2019   HGB 13.3 12/28/2019   PLT 368 12/28/2019   NA 137 12/28/2019   K 3.9 12/28/2019   CL 100 12/28/2019   CO2 24 12/28/2019   GLUCOSE 519 (HH) 12/28/2019   BUN 9 12/28/2019   CREATININE 0.60 12/28/2019   BILITOT 0.5 12/28/2019   ALKPHOS 173 (H) 12/28/2019   AST 24 12/28/2019   ALT 41 12/28/2019   PROT 8.7 (H) 12/28/2019   ALBUMIN 4.1 12/28/2019   CALCIUM 9.0 12/28/2019   GFRAA >60 12/28/2019    Speciality Comments: No specialty comments available.  Procedures:  No procedures performed Allergies: Patient has no known allergies.   Assessment / Plan:     Visit Diagnoses: Trochanteric bursitis of both hips-she has tenderness over bilateral trochanteric bursa consistent with trochanteric bursitis.  Patient requested cortisone injections.  I would avoid cortisone injection as she has been diabetic now.  PT IT band stretches were demonstrated in the office and a handout was given.  Primary osteoarthritis of both knees-she has chronic discomfort.  Fibromyalgia-she continues to have some generalized pain and hyperalgesia.  Her pain is manageable with tramadol 2 tablets at bedtime.  She states without tramadol she is unable to sleep at night.  Prescription refill will be given today.  She has been out of tramadol for the last few weeks.  UDS will be checked today as well.  Other fatigue-secondary to insomnia.  Primary insomnia-she is able to sleep with Ambien 10 mg p.o. nightly.  She takes Flexeril only on as needed basis.  I have advised her to reduce Flexeril to only 5 mg p.o. nightly as needed.  We will also try to taper Ambien dose in the future which we discussed at length.  History of diabetes mellitus-she has recently been diagnosed with diabetes.  She is on Lantus insulin and Metformin none.  ANA positive-patient has no clinical features of autoimmune disease.  Vitamin D deficiency-she had severe vitamin D deficiency in the past.  She  is requesting to have repeat level checked.  History of gastroesophageal reflux (GERD)  History of anemia  Gastroesophageal reflux disease without esophagitis  Orders: Orders Placed This Encounter  Procedures  . Pain Mgmt, Profile 5 w/Conf, U  . Pain Mgmt, Tramadol w/medMATCH, U  . VITAMIN D 25 Hydroxy (Vit-D Deficiency, Fractures)   Meds ordered this encounter  Medications  . zolpidem (AMBIEN) 10 MG tablet    Sig: Take 1 tablet (10 mg total) by mouth at bedtime as needed for sleep.    Dispense:  30 tablet    Refill:  0    This request is for a new prescription for  a controlled substance as required by Federal/State law.  . traMADol (ULTRAM) 50 MG tablet    Sig: TAKE 2 TABLETS BY MOUTH EVERY DAY AT BEDTIME AS NEEDED FOR PAIN    Dispense:  60 tablet    Refill:  0    Face-to-face time spent with patient was 30 minutes. Greater than 50% of time was spent in counseling and coordination of care.  Follow-Up Instructions: Return in about 6 months (around 07/25/2020) for FMS.   Bo Merino, MD  Note - This record has been created using Editor, commissioning.  Chart creation errors have been sought, but may not always  have been located. Such creation errors do not reflect on  the standard of medical care.

## 2020-01-26 ENCOUNTER — Encounter: Payer: Self-pay | Admitting: Rheumatology

## 2020-01-26 ENCOUNTER — Other Ambulatory Visit: Payer: Self-pay

## 2020-01-26 ENCOUNTER — Ambulatory Visit (INDEPENDENT_AMBULATORY_CARE_PROVIDER_SITE_OTHER): Payer: PPO | Admitting: Rheumatology

## 2020-01-26 VITALS — BP 143/101 | HR 113 | Resp 15 | Ht 65.0 in | Wt 231.8 lb

## 2020-01-26 DIAGNOSIS — M7061 Trochanteric bursitis, right hip: Secondary | ICD-10-CM | POA: Diagnosis not present

## 2020-01-26 DIAGNOSIS — Z8639 Personal history of other endocrine, nutritional and metabolic disease: Secondary | ICD-10-CM

## 2020-01-26 DIAGNOSIS — E559 Vitamin D deficiency, unspecified: Secondary | ICD-10-CM

## 2020-01-26 DIAGNOSIS — Z8719 Personal history of other diseases of the digestive system: Secondary | ICD-10-CM

## 2020-01-26 DIAGNOSIS — K219 Gastro-esophageal reflux disease without esophagitis: Secondary | ICD-10-CM | POA: Diagnosis not present

## 2020-01-26 DIAGNOSIS — R768 Other specified abnormal immunological findings in serum: Secondary | ICD-10-CM

## 2020-01-26 DIAGNOSIS — R5383 Other fatigue: Secondary | ICD-10-CM

## 2020-01-26 DIAGNOSIS — M7062 Trochanteric bursitis, left hip: Secondary | ICD-10-CM

## 2020-01-26 DIAGNOSIS — Z862 Personal history of diseases of the blood and blood-forming organs and certain disorders involving the immune mechanism: Secondary | ICD-10-CM

## 2020-01-26 DIAGNOSIS — F5101 Primary insomnia: Secondary | ICD-10-CM

## 2020-01-26 DIAGNOSIS — M170 Bilateral primary osteoarthritis of knee: Secondary | ICD-10-CM | POA: Diagnosis not present

## 2020-01-26 DIAGNOSIS — M797 Fibromyalgia: Secondary | ICD-10-CM

## 2020-01-26 DIAGNOSIS — Z5181 Encounter for therapeutic drug level monitoring: Secondary | ICD-10-CM

## 2020-01-26 MED ORDER — ZOLPIDEM TARTRATE 10 MG PO TABS: 10.0000 mg | ORAL_TABLET | Freq: Every evening | ORAL | 0 refills | Status: DC | PRN

## 2020-01-26 MED ORDER — TRAMADOL HCL 50 MG PO TABS: ORAL_TABLET | ORAL | 0 refills | Status: DC

## 2020-01-26 NOTE — Patient Instructions (Signed)
Iliotibial Band Syndrome Rehab Ask your health care provider which exercises are safe for you. Do exercises exactly as told by your health care provider and adjust them as directed. It is normal to feel mild stretching, pulling, tightness, or discomfort as you do these exercises. Stop right away if you feel sudden pain or your pain gets significantly worse. Do not begin these exercises until told by your health care provider. Stretching and range-of-motion exercises These exercises warm up your muscles and joints and improve the movement and flexibility of your hip and pelvis. Quadriceps stretch, prone  1. Lie on your abdomen on a firm surface, such as a bed or padded floor (prone position). 2. Bend your left / right knee and reach back to hold your ankle or pant leg. If you cannot reach your ankle or pant leg, loop a belt around your foot and grab the belt instead. 3. Gently pull your heel toward your buttocks. Your knee should not slide out to the side. You should feel a stretch in the front of your thigh and knee (quadriceps). 4. Hold this position for __________ seconds. Repeat __________ times. Complete this exercise __________ times a day. Iliotibial band stretch An iliotibial band is a strong band of muscle tissue that runs from the outer side of your hip to the outer side of your thigh and knee. 1. Lie on your side with your left / right leg in the top position. 2. Bend both of your knees and grab your left / right ankle. Stretch out your bottom arm to help you balance. 3. Slowly bring your top knee back so your thigh goes behind your trunk. 4. Slowly lower your top leg toward the floor until you feel a gentle stretch on the outside of your left / right hip and thigh. If you do not feel a stretch and your knee will not fall farther, place the heel of your other foot on top of your knee and pull your knee down toward the floor with your foot. 5. Hold this position for __________  seconds. Repeat __________ times. Complete this exercise __________ times a day. Strengthening exercises These exercises build strength and endurance in your hip and pelvis. Endurance is the ability to use your muscles for a long time, even after they get tired. Straight leg raises, side-lying This exercise strengthens the muscles that rotate the leg at the hip and move it away from your body (hip abductors). 1. Lie on your side with your left / right leg in the top position. Lie so your head, shoulder, hip, and knee line up. You may bend your bottom knee to help you balance. 2. Roll your hips slightly forward so your hips are stacked directly over each other and your left / right knee is facing forward. 3. Tense the muscles in your outer thigh and lift your top leg 4-6 inches (10-15 cm). 4. Hold this position for __________ seconds. 5. Slowly return to the starting position. Let your muscles relax completely before doing another repetition. Repeat __________ times. Complete this exercise __________ times a day. Leg raises, prone This exercise strengthens the muscles that move the hips (hip extensors). 1. Lie on your abdomen on your bed or a firm surface. You can put a pillow under your hips if that is more comfortable for your lower back. 2. Bend your left / right knee so your foot is straight up in the air. 3. Squeeze your buttocks muscles and lift your left / right thigh   off the bed. Do not let your back arch. 4. Tense your thigh muscle as hard as you can without increasing any knee pain. 5. Hold this position for __________ seconds. 6. Slowly lower your leg to the starting position and allow it to relax completely. Repeat __________ times. Complete this exercise __________ times a day. Hip hike 1. Stand sideways on a bottom step. Stand on your left / right leg with your other foot unsupported next to the step. You can hold on to the railing or wall for balance if needed. 2. Keep your knees  straight and your torso square. Then lift your left / right hip up toward the ceiling. 3. Slowly let your left / right hip lower toward the floor, past the starting position. Your foot should get closer to the floor. Do not lean or bend your knees. Repeat __________ times. Complete this exercise __________ times a day. This information is not intended to replace advice given to you by your health care provider. Make sure you discuss any questions you have with your health care provider. Document Revised: 03/26/2019 Document Reviewed: 09/24/2018 Elsevier Patient Education  2020 [employer]  

## 2020-01-27 ENCOUNTER — Telehealth: Payer: Self-pay | Admitting: *Deleted

## 2020-01-27 DIAGNOSIS — E559 Vitamin D deficiency, unspecified: Secondary | ICD-10-CM

## 2020-01-27 MED ORDER — VITAMIN D (ERGOCALCIFEROL) 1.25 MG (50000 UNIT) PO CAPS: 50000.0000 [IU] | ORAL_CAPSULE | ORAL | 0 refills | Status: DC

## 2020-01-27 NOTE — Telephone Encounter (Signed)
-----   Message from Bo Merino, MD sent at 01/27/2020  8:38 AM EST ----- Vitamin D is very low.  Please advise patient to take vitamin D 50,000 units twice a week for 3 months.  Recheck vitamin D in 3 months.  She will need maintenance therapy once vitamin D is normal.

## 2020-01-27 NOTE — Progress Notes (Signed)
Vitamin D is very low.  Please advise patient to take vitamin D 50,000 units twice a week for 3 months.  Recheck vitamin D in 3 months.  She will need maintenance therapy once vitamin D is normal.

## 2020-01-28 LAB — PAIN MGMT, PROFILE 5 W/CONF, U
Amphetamines: NEGATIVE ng/mL
Barbiturates: NEGATIVE ng/mL
Benzodiazepines: NEGATIVE ng/mL
Cocaine Metabolite: NEGATIVE ng/mL
Creatinine: 108.1 mg/dL
Marijuana Metabolite: NEGATIVE ng/mL
Methadone Metabolite: NEGATIVE ng/mL
Opiates: NEGATIVE ng/mL
Oxidant: NEGATIVE ug/mL
Oxycodone: NEGATIVE ng/mL
pH: 5.2 (ref 4.5–9.0)

## 2020-01-28 LAB — PAIN MGMT, TRAMADOL W/MEDMATCH, U
Desmethyltramadol: NEGATIVE ng/mL
Tramadol: NEGATIVE ng/mL

## 2020-01-28 LAB — VITAMIN D 25 HYDROXY (VIT D DEFICIENCY, FRACTURES): Vit D, 25-Hydroxy: 16 ng/mL — ABNORMAL LOW (ref 30–100)

## 2020-01-28 NOTE — Progress Notes (Signed)
UDS is consistent with treatment.

## 2020-02-16 ENCOUNTER — Other Ambulatory Visit: Payer: Self-pay | Admitting: Rheumatology

## 2020-02-17 NOTE — Telephone Encounter (Signed)
Last Visit: 01/26/20 Next Visit: 07/25/20  Okay to refill per Dr.Deveshwar

## 2020-02-23 DIAGNOSIS — G4733 Obstructive sleep apnea (adult) (pediatric): Secondary | ICD-10-CM | POA: Diagnosis not present

## 2020-02-29 ENCOUNTER — Other Ambulatory Visit: Payer: Self-pay | Admitting: Rheumatology

## 2020-02-29 NOTE — Telephone Encounter (Signed)
Last Visit: 01/26/20 Next Visit: 07/25/20 UDS: 01/26/20 Narc Agreement: 01/26/20  Last Fill: 01/26/20   Okay to refill Tramadol and Ambien?

## 2020-04-13 ENCOUNTER — Other Ambulatory Visit: Payer: Self-pay | Admitting: Physician Assistant

## 2020-04-13 ENCOUNTER — Other Ambulatory Visit: Payer: Self-pay | Admitting: Rheumatology

## 2020-04-13 DIAGNOSIS — G4733 Obstructive sleep apnea (adult) (pediatric): Secondary | ICD-10-CM | POA: Diagnosis not present

## 2020-04-13 DIAGNOSIS — Z794 Long term (current) use of insulin: Secondary | ICD-10-CM | POA: Diagnosis not present

## 2020-04-13 DIAGNOSIS — R05 Cough: Secondary | ICD-10-CM | POA: Diagnosis not present

## 2020-04-13 DIAGNOSIS — E1169 Type 2 diabetes mellitus with other specified complication: Secondary | ICD-10-CM | POA: Diagnosis not present

## 2020-04-13 DIAGNOSIS — D509 Iron deficiency anemia, unspecified: Secondary | ICD-10-CM | POA: Diagnosis not present

## 2020-04-13 DIAGNOSIS — I10 Essential (primary) hypertension: Secondary | ICD-10-CM | POA: Diagnosis not present

## 2020-04-13 DIAGNOSIS — R112 Nausea with vomiting, unspecified: Secondary | ICD-10-CM | POA: Diagnosis not present

## 2020-04-13 NOTE — Telephone Encounter (Signed)
Last Visit: 01/26/2020 Next Visit: 07/25/2020  Last fill: 02/29/2020  Okay to refill Lorrin Mais?

## 2020-05-10 DIAGNOSIS — E1169 Type 2 diabetes mellitus with other specified complication: Secondary | ICD-10-CM | POA: Diagnosis not present

## 2020-05-10 DIAGNOSIS — Z794 Long term (current) use of insulin: Secondary | ICD-10-CM | POA: Diagnosis not present

## 2020-05-10 DIAGNOSIS — D509 Iron deficiency anemia, unspecified: Secondary | ICD-10-CM | POA: Diagnosis not present

## 2020-05-13 DIAGNOSIS — Z6837 Body mass index (BMI) 37.0-37.9, adult: Secondary | ICD-10-CM | POA: Diagnosis not present

## 2020-05-13 DIAGNOSIS — Z794 Long term (current) use of insulin: Secondary | ICD-10-CM | POA: Diagnosis not present

## 2020-05-13 DIAGNOSIS — E1165 Type 2 diabetes mellitus with hyperglycemia: Secondary | ICD-10-CM | POA: Diagnosis not present

## 2020-05-13 DIAGNOSIS — J209 Acute bronchitis, unspecified: Secondary | ICD-10-CM | POA: Diagnosis not present

## 2020-05-18 ENCOUNTER — Other Ambulatory Visit: Payer: Self-pay | Admitting: Physician Assistant

## 2020-05-19 NOTE — Telephone Encounter (Signed)
Attempted to contact patient and left message for patient to call the office.  

## 2020-05-19 NOTE — Telephone Encounter (Signed)
Last Visit: 01/26/2020 Next Visit: 07/25/2020  Last fill: 04/13/2020  Okay to refill Lorrin Mais?

## 2020-05-19 NOTE — Telephone Encounter (Signed)
Her insurance is alerting high-dose on Ambien.  If patient is willing to reduce Ambien to 5 mg p.o. daily then we can refill it.

## 2020-05-20 NOTE — Telephone Encounter (Signed)
Attempted to contact the patient and left message for patient to call the office.  

## 2020-05-24 DIAGNOSIS — G4733 Obstructive sleep apnea (adult) (pediatric): Secondary | ICD-10-CM | POA: Diagnosis not present

## 2020-06-06 NOTE — Progress Notes (Deleted)
Office Visit Note  Patient: [name]             Date of Birth: 1971/01/22           MRN: [id number]             PCP: Donald Prose, MD Referring: Donald Prose, MD Visit Date: 06/07/2020 Occupation: @GUAROCC @  Subjective:  No chief complaint on file.   History of Present Illness: [name] is a 49 y.o. female ***   Activities of Daily Living:  Patient reports morning stiffness for *** {minute/hour:19697}.   Patient {ACTIONS;DENIES/REPORTS:21021675::"Denies"} nocturnal pain.  Difficulty dressing/grooming: {ACTIONS;DENIES/REPORTS:21021675::"Denies"} Difficulty climbing stairs: {ACTIONS;DENIES/REPORTS:21021675::"Denies"} Difficulty getting out of chair: {ACTIONS;DENIES/REPORTS:21021675::"Denies"} Difficulty using hands for taps, buttons, cutlery, and/or writing: {ACTIONS;DENIES/REPORTS:21021675::"Denies"}  No Rheumatology ROS completed.   PMFS History:  Patient Active Problem List   Diagnosis Date Noted  . Fibromyalgia 12/25/2016  . Other fatigue 12/25/2016  . Primary insomnia 12/25/2016  . Primary osteoarthritis of both knees 12/25/2016  . ANA positive 12/25/2016  . History of gastroesophageal reflux (GERD) 12/25/2016  . History of anemia 12/25/2016  . Trochanteric bursitis of both hips 12/25/2016  . Anemia, iron deficiency 04/18/2015  . Dysphagia, pharyngoesophageal phase 11/14/2011  . Esophageal reflux 11/14/2011    Past Medical History:  Diagnosis Date  . ADD (attention deficit disorder with hyperactivity)   . Allergic rhinitis   . Anxiety   . Arthritis   . Depression   . Dysmenorrhea   . Fibromyalgia   . GERD (gastroesophageal reflux disease)   . Insomnia   . Irritable bowel syndrome   . Restless leg syndrome     Family History  Problem Relation Age of Onset  . Breast cancer Maternal Grandmother   . Diabetes Sister   . Heart disease Brother   . Heart attack Brother   . Stroke Mother   . Diabetes Sister   . Colon cancer Neg Hx    . Colon polyps Neg Hx   . Esophageal cancer Neg Hx   . Kidney disease Neg Hx   . Gallbladder disease Neg Hx   . Stomach cancer Neg Hx    Past Surgical History:  Procedure Laterality Date  . BUNIONECTOMY  2008   left  . DILATION AND CURETTAGE OF UTERUS  2004  . EAR CYST EXCISION  1983   Left  . KNEE SURGERY Right    Social History   Social History Narrative  . Not on file    There is no immunization history on file for this patient.   Objective: Vital Signs: There were no vitals taken for this visit.   Physical Exam   Musculoskeletal Exam: ***  CDAI Exam: CDAI Score: -- Patient Global: --; Provider Global: -- Swollen: --; Tender: -- Joint Exam 06/07/2020   No joint exam has been documented for this visit   There is currently no information documented on the homunculus. Go to the Rheumatology activity and complete the homunculus joint exam.  Investigation: No additional findings.  Imaging: No results found.  Recent Labs: Lab Results  Component Value Date   WBC 8.4 12/28/2019   HGB 13.3 12/28/2019   PLT 368 12/28/2019   NA 137 12/28/2019   K 3.9 12/28/2019   CL 100 12/28/2019   CO2 24 12/28/2019   GLUCOSE 519 (HH) 12/28/2019   BUN 9 12/28/2019   CREATININE 0.60 12/28/2019   BILITOT 0.5 12/28/2019   ALKPHOS 173 (H) 12/28/2019   AST 24 12/28/2019   ALT  41 12/28/2019   PROT 8.7 (H) 12/28/2019   ALBUMIN 4.1 12/28/2019   CALCIUM 9.0 12/28/2019   GFRAA >60 12/28/2019    Speciality Comments: No specialty comments available.  Procedures:  No procedures performed Allergies: Patient has no known allergies.   Assessment / Plan:     Visit Diagnoses: No diagnosis found.  Orders: No orders of the defined types were placed in this encounter.  No orders of the defined types were placed in this encounter.   Face-to-face time spent with patient was *** minutes. Greater than 50% of time was spent in counseling and coordination of care.  Follow-Up  Instructions: No follow-ups on file.   Earnestine Mealing, CMA  Note - This record has been created using Editor, commissioning.  Chart creation errors have been sought, but may not always  have been located. Such creation errors do not reflect on  the standard of medical care.

## 2020-06-07 ENCOUNTER — Ambulatory Visit: Payer: PPO | Admitting: Rheumatology

## 2020-06-10 DIAGNOSIS — E1165 Type 2 diabetes mellitus with hyperglycemia: Secondary | ICD-10-CM | POA: Diagnosis not present

## 2020-06-10 DIAGNOSIS — Z794 Long term (current) use of insulin: Secondary | ICD-10-CM | POA: Diagnosis not present

## 2020-06-12 DIAGNOSIS — G4733 Obstructive sleep apnea (adult) (pediatric): Secondary | ICD-10-CM | POA: Diagnosis not present

## 2020-06-18 ENCOUNTER — Other Ambulatory Visit: Payer: Self-pay | Admitting: Rheumatology

## 2020-06-20 DIAGNOSIS — R05 Cough: Secondary | ICD-10-CM | POA: Diagnosis not present

## 2020-06-20 DIAGNOSIS — S6992XA Unspecified injury of left wrist, hand and finger(s), initial encounter: Secondary | ICD-10-CM | POA: Diagnosis not present

## 2020-06-20 DIAGNOSIS — J22 Unspecified acute lower respiratory infection: Secondary | ICD-10-CM | POA: Diagnosis not present

## 2020-06-21 NOTE — Telephone Encounter (Signed)
Last Visit: 01/26/2020 Next Visit: 07/25/2020  Last fill: 05/20/2020  Okay to refill Lorrin Mais?

## 2020-06-29 ENCOUNTER — Ambulatory Visit (INDEPENDENT_AMBULATORY_CARE_PROVIDER_SITE_OTHER): Payer: PPO | Admitting: Nurse Practitioner

## 2020-06-29 ENCOUNTER — Encounter: Payer: Self-pay | Admitting: Nurse Practitioner

## 2020-06-29 ENCOUNTER — Encounter: Payer: Self-pay | Admitting: Gastroenterology

## 2020-06-29 ENCOUNTER — Other Ambulatory Visit (INDEPENDENT_AMBULATORY_CARE_PROVIDER_SITE_OTHER): Payer: PPO

## 2020-06-29 DIAGNOSIS — R1013 Epigastric pain: Secondary | ICD-10-CM | POA: Diagnosis not present

## 2020-06-29 DIAGNOSIS — R112 Nausea with vomiting, unspecified: Secondary | ICD-10-CM

## 2020-06-29 HISTORY — PX: UPPER GASTROINTESTINAL ENDOSCOPY: SHX188

## 2020-06-29 LAB — CBC WITH DIFFERENTIAL/PLATELET
Basophils Absolute: 0.1 10*3/uL (ref 0.0–0.1)
Basophils Relative: 1.2 % (ref 0.0–3.0)
Eosinophils Absolute: 0.2 10*3/uL (ref 0.0–0.7)
Eosinophils Relative: 1.8 % (ref 0.0–5.0)
HCT: 39.4 % (ref 36.0–46.0)
Hemoglobin: 12.7 g/dL (ref 12.0–15.0)
Lymphocytes Relative: 42.6 % (ref 12.0–46.0)
Lymphs Abs: 4.3 10*3/uL — ABNORMAL HIGH (ref 0.7–4.0)
MCHC: 32.3 g/dL (ref 30.0–36.0)
MCV: 76.2 fl — ABNORMAL LOW (ref 78.0–100.0)
Monocytes Absolute: 0.6 10*3/uL (ref 0.1–1.0)
Monocytes Relative: 5.8 % (ref 3.0–12.0)
Neutro Abs: 4.9 10*3/uL (ref 1.4–7.7)
Neutrophils Relative %: 48.6 % (ref 43.0–77.0)
Platelets: 409 10*3/uL — ABNORMAL HIGH (ref 150.0–400.0)
RBC: 5.17 Mil/uL — ABNORMAL HIGH (ref 3.87–5.11)
RDW: 17.6 % — ABNORMAL HIGH (ref 11.5–15.5)
WBC: 10.1 10*3/uL (ref 4.0–10.5)

## 2020-06-29 LAB — COMPREHENSIVE METABOLIC PANEL
ALT: 14 U/L (ref 0–35)
AST: 12 U/L (ref 0–37)
Albumin: 4.1 g/dL (ref 3.5–5.2)
Alkaline Phosphatase: 114 U/L (ref 39–117)
BUN: 8 mg/dL (ref 6–23)
CO2: 28 mEq/L (ref 19–32)
Calcium: 9.2 mg/dL (ref 8.4–10.5)
Chloride: 101 mEq/L (ref 96–112)
Creatinine, Ser: 0.78 mg/dL (ref 0.40–1.20)
GFR: 94.27 mL/min (ref >60.00)
Glucose, Bld: 191 mg/dL — ABNORMAL HIGH (ref 70–99)
Potassium: 3.3 mEq/L — ABNORMAL LOW (ref 3.5–5.1)
Sodium: 137 mEq/L (ref 135–145)
Total Bilirubin: 0.3 mg/dL (ref 0.2–1.2)
Total Protein: 7.8 g/dL (ref 6.0–8.3)

## 2020-06-29 LAB — LIPASE: Lipase: 26 U/L (ref 11.0–59.0)

## 2020-06-29 MED ORDER — OMEPRAZOLE 20 MG PO CPDR: 20.0000 mg | DELAYED_RELEASE_CAPSULE | Freq: Two times a day (BID) | ORAL | 1 refills | Status: DC

## 2020-06-29 MED ORDER — ONDANSETRON HCL 4 MG PO TABS: 4.0000 mg | ORAL_TABLET | Freq: Four times a day (QID) | ORAL | 0 refills | Status: DC | PRN

## 2020-06-29 NOTE — Patient Instructions (Addendum)
If you are age 51 or older, your body mass index should be between 23-30. Your Body mass index is 37.28 kg/m. If this is out of the aforementioned range listed, please consider follow up with your Primary Care Provider.  If you are age 45 or younger, your body mass index should be between 19-25. Your Body mass index is 37.28 kg/m. If this is out of the aformentioned range listed, please consider follow up with your Primary Care Provider.   You have been scheduled for an endoscopy. Please follow written instructions given to you at your visit today. If you use inhalers (even only as needed), please bring them with you on the day of your procedure.  Your provider has requested that you go to the basement level for lab work before leaving today. Press "B" on the elevator. The lab is located at the first door on the left as you exit the elevator.  We have sent the following medications to your pharmacy for you to pick up at your convenience: Ondansetron 4 mg Omeprazole 20mg    Due to recent changes in healthcare laws, you may see the results of your imaging and laboratory studies on MyChart before your provider has had a chance to review them.  We understand that in some cases there may be results that are confusing or concerning to you. Not all laboratory results come back in the same time frame and the provider may be waiting for multiple results in order to interpret others.  Please give [place] 48 hours in order for your provider to thoroughly review all the results before contacting the office for clarification of your results.

## 2020-06-29 NOTE — Progress Notes (Signed)
..    06/29/2020 [name] [id number] 01-18-1971   CHIEF COMPLAINT: Heartburn, stomach burning, nausea   HISTORY OF PRESENT ILLNESS: [name] is a 49 year old female with a past medical history of arthritis, anxiety, depression, fibromyalgia, DM II, sleep apnea uses cpap, dysphagia, IDA, GERD, H. pylori gastritis 12/2011 and irritable bowel syndrome. She was last seen in office by Dr. Loletha Carrow 04/30/2017 due to having epigastric pain.  She was prescribed Omeprazole 20 mg daily to start after she completed H. pylori breath test 04/30/2017 which was negative.  She presents today with complaints of esophageal burning.  She developed a cough 02/2020 and she saw her primary care physician who prescribed amoxicillin for 10 days.  She developed burning to her esophagus a week or 2 after finishing the antibiotic.  Her esophageal burning did not completely resolved but diminished.  She developed recurrence of a cough in June 2021 and she saw her primary care physician and she was prescribed an antibiotic.  Her esophageal and stomach burning worsened after taking this antibiotic.  She developed nausea and describes vomiting up thick white foamy secretions for a few days 1 week ago.  She looked in the mirror and assessed her uvula was red and swollen without exudate.  No further vomiting.  She is passing normal brown bowel movement most days.  No rectal bleeding or melena.  No NSAID use.  No fever, sweats or chills.  No weight loss.  Her most recent EGD was 01/01/2012, at that time her esophagus was dilated otherwise the EGD was normal.  Prior history of H. pylori notified by EGD 10/2011 treated with Prevpac.  No family history of gastric or colon cancer.   Colonoscopy 05/02/2015 by Dr. Deatra Ina: Normal colonoscopy  Colonoscopy 04/19/2015 by Dr. Deatra Ina: Solid stool in the sigmoid colon Reprep for repeat colonoscopy   EGD 01/01/2012: Dilation with [facility] dilator 79mm, otherwise normal EGD.    EGD 11/16/11: S/P dilation with Maloney dilator 70mm 20mm sessile polyp antrum, biopsies showed hyperplastic polyp with H. Pylori. Treated with Prevpak.   Past Medical History:  Diagnosis Date  . ADD (attention deficit disorder with hyperactivity)   . Allergic rhinitis   . Anxiety   . Arthritis   . Depression   . Dysmenorrhea   . Fibromyalgia   . GERD (gastroesophageal reflux disease)   . Insomnia   . Irritable bowel syndrome   . Restless leg syndrome    Past Surgical History:  Procedure Laterality Date  . BUNIONECTOMY  2008   left  . DILATION AND CURETTAGE OF UTERUS  2004  . EAR CYST EXCISION  1983   Left  . KNEE SURGERY Right     Social History: She is widowed.  She has 1 daughter.  She is on disability.  Past smoker 1ppd x 20 years, quit 26 yrs ago.  No alcohol use.  No drug use.  Family History: Mother with a history of a stroke.  Paternal grandmother with breast cancer.  Sisters with diabetes.  Brother with MI.  No Known Allergies    Outpatient Encounter Medications as of 06/29/2020  Medication Sig  . zolpidem (AMBIEN) 5 MG tablet TAKE 1 TABLET (5 MG TOTAL) BY MOUTH AT BEDTIME AS NEEDED FOR SLEEP.  Marland Kitchen cyclobenzaprine (FLEXERIL) 10 MG tablet TAKE 1 TABLET BY MOUTH EVERYDAY AT BEDTIME AS NEEDED  . dicyclomine (BENTYL) 20 MG tablet Take 1 tablet (20 mg total) by mouth 2 (two) times daily.  Marland Kitchen escitalopram (LEXAPRO) 20 MG  tablet Take 20 mg by mouth at bedtime.   . gabapentin (NEURONTIN) 300 MG capsule Take 600 mg by mouth at bedtime.   Marland Kitchen LANTUS SOLOSTAR 100 UNIT/ML Solostar Pen SMARTSIG:10 Unit(s) SUB-Q Daily  . metFORMIN (GLUCOPHAGE-XR) 500 MG 24 hr tablet Take 500 mg by mouth every evening.   Marland Kitchen omeprazole (PRILOSEC) 40 MG capsule TAKE 1 CAPSULE BY MOUTH EVERY DAY (Patient taking differently: Take 40 mg by mouth daily. )  . ondansetron (ZOFRAN ODT) 8 MG disintegrating tablet Take 1 tablet (8 mg total) by mouth every 8 (eight) hours as needed for nausea or vomiting.  Marland Kitchen  oxybutynin (DITROPAN) 5 MG tablet Take 5 mg by mouth 2 (two) times daily.   Marland Kitchen PRAVASTATIN SODIUM PO Take by mouth daily.  . traMADol (ULTRAM) 50 MG tablet TAKE 2 TABLETS BY MOUTH EVERY DAY AT BEDTIME AS NEEDED FOR PAIN  . Vitamin D, Ergocalciferol, (DRISDOL) 1.25 MG (50000 UNIT) CAPS capsule Take 1 capsule (50,000 Units total) by mouth 2 (two) times a week.  . VOLTAREN 1 % GEL 3 grams tid (Patient taking differently: Apply 3 g topically daily as needed (for pain). )   No facility-administered encounter medications on file as of 06/29/2020.     REVIEW OF SYSTEMS:  Gen: + Chronic night sweats since the age of 59.  No weight loss.  CV: Denies chest pain, palpitations or edema. Resp: Denies cough, shortness of breath of hemoptysis.  GI: See HPI. GU : Denies urinary burning, blood in urine, increased urinary frequency or incontinence. MS: Denies joint pain, muscles aches or weakness. Derm: Denies rash, itchiness, skin lesions or unhealing ulcers. Psych: +depression and anxiety. No memory loss or confusion. Heme: Denies bruising, bleeding. Neuro:  Denies headaches, dizziness or paresthesias. Endo:  Denies any problems with thyroid or adrenal function.    PHYSICAL EXAM: BP 122/80 (BP Location: Left Arm, Patient Position: Sitting, Cuff Size: Normal)   Pulse (!) 120   Ht 5' 5.75" (1.67 m) Comment: height measured without shoes  Wt 229 lb 4 oz (104 kg)   LMP 06/01/2020   BMI 37.28 kg/m  LMP July 2021.  General: Well developed  49 year old female in no acute distress. Head: Normocephalic and atraumatic. Eyes:  Sclerae non-icteric, conjunctive pink. Ears: Normal auditory acuity. Mouth: Dentition intact. No ulcers or lesions.  Neck: Supple, no lymphadenopathy or thyromegaly.  Lungs: Clear bilaterally to auscultation without wheezes, crackles or rhonchi. Heart: Regular rate and rhythm. No murmur, rub or gallop appreciated.  Abdomen: Soft, non distended. Mild epigastric tenderness without  rebound or guarding. No masses. No hepatosplenomegaly. Normoactive bowel sounds x 4 quadrants.  Rectal: Deferred. Musculoskeletal: Symmetrical with no gross deformities. Skin: Warm and dry. No rash or lesions on visible extremities. Extremities: No edema. Neurological: Alert oriented x 4, no focal deficits.  Psychological:  Alert and cooperative. Normal mood and affect.  ASSESSMENT AND PLAN:  62.  49 year old female with reflux which worsened after taking 2 courses of antibiotics in March and June 2021. -EGD to rule out reflux esophagitis versus assess esophagitis and peptic ulcer disease.  EGD benefits and risks discussed including risk with sedation, risk of bleeding, perforation and infection  -Omeprazole 20 mg p.o. twice daily -Ondansetron 4mg  ODT Q 6 to 8 hrs PRN -Discussed GERD diet -Abdominal sonogram if EGD negative  -Further follow-up to be determined after EGD completed  2.  Colon cancer screening.  Next colonoscopy due May 2026, earlier if symptoms warrant.  3.  Diabetes mellitus  type 2  4. Sleep Apnea, uses cpap       CC:  Donald Prose, MD

## 2020-06-30 DIAGNOSIS — M65332 Trigger finger, left middle finger: Secondary | ICD-10-CM | POA: Diagnosis not present

## 2020-07-04 ENCOUNTER — Ambulatory Visit (AMBULATORY_SURGERY_CENTER): Payer: PPO | Admitting: Gastroenterology

## 2020-07-04 ENCOUNTER — Other Ambulatory Visit: Payer: Self-pay

## 2020-07-04 ENCOUNTER — Encounter: Payer: Self-pay | Admitting: Gastroenterology

## 2020-07-04 VITALS — BP 116/79 | HR 81 | Temp 97.7°F | Resp 17 | Ht 65.0 in | Wt 226.0 lb

## 2020-07-04 DIAGNOSIS — R112 Nausea with vomiting, unspecified: Secondary | ICD-10-CM

## 2020-07-04 DIAGNOSIS — R634 Abnormal weight loss: Secondary | ICD-10-CM

## 2020-07-04 DIAGNOSIS — K2970 Gastritis, unspecified, without bleeding: Secondary | ICD-10-CM | POA: Diagnosis not present

## 2020-07-04 DIAGNOSIS — B9681 Helicobacter pylori [H. pylori] as the cause of diseases classified elsewhere: Secondary | ICD-10-CM

## 2020-07-04 DIAGNOSIS — R1013 Epigastric pain: Secondary | ICD-10-CM

## 2020-07-04 DIAGNOSIS — K2950 Unspecified chronic gastritis without bleeding: Secondary | ICD-10-CM | POA: Diagnosis not present

## 2020-07-04 MED ORDER — SODIUM CHLORIDE 0.9 % IV SOLN: 500.0000 mL | Freq: Once | INTRAVENOUS | Status: DC

## 2020-07-04 NOTE — Progress Notes (Signed)
____________________________________________________________  Attending physician addendum:  Thank you for sending this case to me. I have reviewed the entire note, and the outlined plan seems appropriate.  I reviewed this note and spoke with her before her EGD earlier today.   CT abdomen ordered.  Wilfrid Lund, MD  ____________________________________________________________

## 2020-07-04 NOTE — Progress Notes (Signed)
Pt's states no medical or surgical changes since previsit or office visit.  Temp/Vitals- Loma Sousa

## 2020-07-04 NOTE — Progress Notes (Signed)
A and O x3. Report to RN. Tolerated MAC anesthesia well.Teeth unchanged after procedure.

## 2020-07-04 NOTE — Patient Instructions (Addendum)
Provided patient with CT contrast to take home for appointment to be scheduled. Dr. Loletha Carrow' office will call to arrange this appointment, keep the paper with you so that you can write down the instructions when they call.   YOU HAD AN ENDOSCOPIC PROCEDURE TODAY AT [facility]:   Refer to the procedure report that was given to you for any specific questions about what was found during the examination.  If the procedure report does not answer your questions, please call your gastroenterologist to clarify.  If you requested that your care partner not be given the details of your procedure findings, then the procedure report has been included in a sealed envelope for you to review at your convenience later.  YOU SHOULD EXPECT: Some feelings of bloating in the abdomen. Passage of more gas than usual.  Walking can help get rid of the air that was put into your GI tract during the procedure and reduce the bloating. If you had a lower endoscopy (such as a colonoscopy or flexible sigmoidoscopy) you may notice spotting of blood in your stool or on the toilet paper. If you underwent a bowel prep for your procedure, you may not have a normal bowel movement for a few days.  Please Note:  You might notice some irritation and congestion in your nose or some drainage.  This is from the oxygen used during your procedure.  There is no need for concern and it should clear up in a day or so.  SYMPTOMS TO REPORT IMMEDIATELY:   Following upper endoscopy (EGD)  Vomiting of blood or coffee ground material  New chest pain or pain under the shoulder blades  Painful or persistently difficult swallowing  New shortness of breath  Fever of 100F or higher  Black, tarry-looking stools  For urgent or emergent issues, a gastroenterologist can be reached at any hour by calling [phone]. Do not use MyChart messaging for urgent concerns.    DIET:  We do recommend a small meal at first, but then you may  proceed to your regular diet.  Drink plenty of fluids but you should avoid alcoholic beverages for 24 hours.  ACTIVITY:  You should plan to take it easy for the rest of today and you should NOT DRIVE or use heavy machinery until tomorrow (because of the sedation medicines used during the test).    FOLLOW UP: Our staff will call the number listed on your records 48-72 hours following your procedure to check on you and address any questions or concerns that you may have regarding the information given to you following your procedure. If we do not reach you, we will leave a message.  We will attempt to reach you two times.  During this call, we will ask if you have developed any symptoms of COVID 19. If you develop any symptoms (ie: fever, flu-like symptoms, shortness of breath, cough etc.) before then, please call [phone].  If you test positive for Covid 19 in the 2 weeks post procedure, please call and report this information to [place].    If any biopsies were taken you will be contacted by phone or by letter within the next 1-3 weeks.  Please call us at [phone] if you have not heard about the biopsies in 3 weeks.    SIGNATURES/CONFIDENTIALITY: You and/or your care partner have signed paperwork which will be entered into your electronic medical record.  These signatures attest to the fact that that the information above on  your After Visit Summary has been reviewed and is understood.  Full responsibility of the confidentiality of this discharge information lies with you and/or your care-partner.

## 2020-07-04 NOTE — Progress Notes (Signed)
Called to room to assist during endoscopic procedure.  Patient ID and intended procedure confirmed with present staff. Received instructions for my participation in the procedure from the performing physician.  

## 2020-07-04 NOTE — Op Note (Signed)
[facility] Patient Name: [name] Procedure Date: 07/04/2020 10:01 AM MRN: [id number] Endoscopist: Mallie Mussel L. Loletha Carrow , MD Age: 49 Referring MD:  Date of Birth: [birth year] Gender: Female Account #: [id number] Procedure:                Upper GI endoscopy Indications:              Epigastric abdominal pain, Nausea with vomiting,                            Weight loss (few months, patient recalls since two                            rounds of Abx for cough. Intermittent epigastric                            pain since at least 2012 with H pylori treatment,                            negative UBT 2018, recent office visit) Medicines:                Monitored Anesthesia Care Procedure:                Pre-Anesthesia Assessment:                           - Prior to the procedure, a History and Physical                            was performed, and patient medications and                            allergies were reviewed. The patient's tolerance of                            previous anesthesia was also reviewed. The risks                            and benefits of the procedure and the sedation                            options and risks were discussed with the patient.                            All questions were answered, and informed consent                            was obtained. Prior Anticoagulants: The patient has                            taken no previous anticoagulant or antiplatelet                            agents. ASA Grade Assessment: III - A patient with  severe systemic disease. After reviewing the risks                            and benefits, the patient was deemed in                            satisfactory condition to undergo the procedure.                           After obtaining informed consent, the endoscope was                            passed under direct vision. Throughout the                             procedure, the patient's blood pressure, pulse, and                            oxygen saturations were monitored continuously. The                            Endoscope was introduced through the mouth, and                            advanced to the second part of duodenum. The upper                            GI endoscopy was accomplished without difficulty.                            The patient tolerated the procedure well. Scope In: Scope Out: Findings:                 The esophagus was normal.                           Diffuse mildly congested mucosa was found in the                            entire examined stomach. Several biopsies were                            obtained in the gastric body and in the gastric                            antrum with cold forceps for histology.                           The exam of the stomach was otherwise normal.                           The cardia and gastric fundus were normal on  retroflexion.                           The examined duodenum was normal. Complications:            No immediate complications. Estimated Blood Loss:     Estimated blood loss was minimal. Impression:               - Normal esophagus.                           - Congestive gastropathy. Mild and nonspecific                            finding that appears unlikely to explain patient's                            symptoms.                           - Normal examined duodenum.                           - Several biopsies were obtained in the gastric                            body and in the gastric antrum. Recommendation:           - Patient has a contact number available for                            emergencies. The signs and symptoms of potential                            delayed complications were discussed with the                            patient. Return to normal activities tomorrow.                            Written discharge  instructions were provided to the                            patient.                           - Resume previous diet.                           - Continue present medications.                           - Await pathology results.                           - Perform a CT scan (computed tomography) of  abdomen with contrast and pelvis with contrast at                            appointment to be scheduled. Henry L. Loletha Carrow, MD 07/04/2020 10:32:49 AM This report has been signed electronically.

## 2020-07-04 NOTE — Progress Notes (Signed)
robinol antisialogogue  Lidocaine   buffer 

## 2020-07-05 ENCOUNTER — Other Ambulatory Visit: Payer: Self-pay

## 2020-07-05 DIAGNOSIS — R1013 Epigastric pain: Secondary | ICD-10-CM

## 2020-07-05 DIAGNOSIS — R634 Abnormal weight loss: Secondary | ICD-10-CM

## 2020-07-05 NOTE — Progress Notes (Signed)
Pt scheduled for CT of A./P at [facility] 07/12/20@3pm , pt to arrive there at 2:45pm. Pt to be NPO 4 hours prior to scan except she is to drink bottle 1 of contrast at 1pm and bottle 2 at 2pm. Left message for pt to call back regarding appt.

## 2020-07-06 ENCOUNTER — Telehealth: Payer: Self-pay

## 2020-07-06 NOTE — Telephone Encounter (Signed)
  Follow up Call-  Call back number 07/04/2020  Post procedure Call Back phone  # [phone]  Permission to leave phone message Yes  Some recent data might be hidden     Patient questions:  Do you have a fever, pain , or abdominal swelling? No. Pain Score  0 *  Have you tolerated food without any problems? Yes.    Have you been able to return to your normal activities? Yes.    Do you have any questions about your discharge instructions: Diet   No. Medications  No. Follow up visit  No.  Do you have questions or concerns about your Care? No.  Actions: * If pain score is 4 or above: No action needed, pain <4.  1. Have you developed a fever since your procedure? no  2.   Have you had an respiratory symptoms (SOB or cough) since your procedure? no  3.   Have you tested positive for COVID 19 since your procedure no  4.   Have you had any family members/close contacts diagnosed with the COVID 19 since your procedure?  no   If yes to any of these questions please route to Joylene John, RN and Erenest Rasher, RN

## 2020-07-06 NOTE — Telephone Encounter (Signed)
-----   Message from Doran Stabler, MD sent at 07/04/2020 11:44 AM EDT ----- [name],  This patient had an EGD today.   Please schedule a CT scan abdomen and pelvis for epigastric pain and weight loss.  She had a normal creatinine on 7/14 and was given oral contrast at [facility] today.  - HD

## 2020-07-11 NOTE — Progress Notes (Signed)
Office Visit Note  Patient: [name]             Date of Birth: 1971-11-24           MRN: [id number]             PCP: Donald Prose, MD Referring: Donald Prose, MD Visit Date: 07/25/2020 Occupation: @GUAROCC @  Subjective:  Generalized pain   History of Present Illness: [name] is a 49 y.o. female with history of fibromyalgia and osteoarthritis.  She presents today having a fibromyalgia flare.  She has been having more frequent fibromyalgia flares.  She is currently having trapezius muscle tension and tenderness.  She also has persistent trochanteric bursitis bilaterally and lower back pain. She has been performing stretching exercises daily and has gone to PT in the past.  Patient reports seeing Dr. Grandville Silos at [place] for a left 3rd MCP joint cortisone injection about 1 month ago. She has persistent pain in both wrist joints. She denies any joint swelling. She uses voltaren gel topically as needed and takes tylenol prn.  She was previously taking tramadol but could not afford a refill so her UDS was negative.   She denies any recent rashes, increased hair loss, symptoms of Raynaud's, swollen lymph nodes, chest pain, or shortness of breath.  She has recurrent nasal ulcerations.     Activities of Daily Living:  Patient reports morning stiffness for 6 hours.   Patient Reports nocturnal pain.  Difficulty dressing/grooming: Denies Difficulty climbing stairs: Reports Difficulty getting out of chair: Reports Difficulty using hands for taps, buttons, cutlery, and/or writing: Reports  Review of Systems  Constitutional: Positive for fatigue.  HENT: Negative for mouth sores, mouth dryness and nose dryness.   Eyes: Negative for itching and dryness.  Respiratory: Negative for shortness of breath and difficulty breathing.   Cardiovascular: Negative for chest pain and palpitations.  Gastrointestinal: Negative for blood in stool, constipation and diarrhea.    Endocrine: Positive for increased urination.  Genitourinary: Negative for difficulty urinating.  Musculoskeletal: Positive for arthralgias, joint pain, myalgias, morning stiffness, muscle tenderness and myalgias. Negative for joint swelling.  Skin: Negative for color change, rash and redness.  Allergic/Immunologic: Negative for susceptible to infections.  Neurological: Positive for numbness, headaches and memory loss. Negative for dizziness and weakness.  Hematological: Positive for bruising/bleeding tendency.  Psychiatric/Behavioral: Positive for confusion.    PMFS History:  Patient Active Problem List   Diagnosis Date Noted  . Fibromyalgia 12/25/2016  . Other fatigue 12/25/2016  . Primary insomnia 12/25/2016  . Primary osteoarthritis of both knees 12/25/2016  . ANA positive 12/25/2016  . History of gastroesophageal reflux (GERD) 12/25/2016  . History of anemia 12/25/2016  . Trochanteric bursitis of both hips 12/25/2016  . Anemia, iron deficiency 04/18/2015  . Dysphagia, pharyngoesophageal phase 11/14/2011  . Esophageal reflux 11/14/2011    Past Medical History:  Diagnosis Date  . ADD (attention deficit disorder with hyperactivity)   . Allergic rhinitis   . Anxiety   . Arthritis   . Depression   . Dysmenorrhea   . Fibromyalgia   . GERD (gastroesophageal reflux disease)   . H. pylori infection    per patient   . HLD (hyperlipidemia)   . HTN (hypertension)   . Insomnia   . Irritable bowel syndrome   . Restless leg syndrome   . Sleep apnea     Family History  Problem Relation Age of Onset  . Breast cancer Maternal Grandmother   .  Diabetes Sister   . Irritable bowel syndrome Sister   . Heart disease Brother   . Heart attack Brother   . Stroke Mother   . Heart disease Mother   . Diabetes Sister   . Irritable bowel syndrome Sister   . Fibromyalgia Daughter   . Colon cancer Neg Hx   . Colon polyps Neg Hx   . Esophageal cancer Neg Hx   . Kidney disease Neg Hx    . Gallbladder disease Neg Hx   . Stomach cancer Neg Hx    Past Surgical History:  Procedure Laterality Date  . BUNIONECTOMY Left 2008  . DILATION AND CURETTAGE OF UTERUS  2004  . EAR CYST EXCISION Left 1983  . KNEE ARTHROSCOPY Right    with knee cap adjustment  . UPPER GASTROINTESTINAL ENDOSCOPY  06/29/2020   Social History   Social History Narrative  . Not on file    There is no immunization history on file for this patient.   Objective: Vital Signs: BP 123/87 (BP Location: Right Arm, Patient Position: Sitting, Cuff Size: Large)   Pulse (!) 113   Resp 15   Ht 5\' 5"  (1.651 m)   Wt 233 lb (105.7 kg)   BMI 38.77 kg/m    Physical Exam Vitals and nursing note reviewed.  Constitutional:      Appearance: She is well-developed.  HENT:     Head: Normocephalic and atraumatic.  Eyes:     Conjunctiva/sclera: Conjunctivae normal.  Pulmonary:     Effort: Pulmonary effort is normal.  Abdominal:     General: Bowel sounds are normal.     Palpations: Abdomen is soft.  Musculoskeletal:     Cervical back: Normal range of motion.  Lymphadenopathy:     Cervical: No cervical adenopathy.  Skin:    General: Skin is warm and dry.     Capillary Refill: Capillary refill takes less than 2 seconds.  Neurological:     Mental Status: She is alert and oriented to person, place, and time.  Psychiatric:        Behavior: Behavior normal.      Musculoskeletal Exam: Generalized hyperalgesia and positive tender points. C-spine, thoracic spine, and lumbar spine good ROM.  Trapezius muscle tension and tenderness.  Shoulder joints, elbow joints, elbow joints, wrist joints, MCPs, PIPs, and DIPs good ROM with no synovitis.  Complete fist formation bilaterally.  Hip joints good ROM.  Tenderness over both trochanteric bursa.  Knee joints good ROM with no warmth or effusion.  Ankle joints good ROM with no tenderness or inflammation.   CDAI Exam: CDAI Score: -- Patient Global: --; Provider Global:  -- Swollen: 0 ; Tender: 2  Joint Exam 07/25/2020      Right  Left  Wrist   Tender   Tender     Investigation: No additional findings.  Imaging: No results found.  Recent Labs: Lab Results  Component Value Date   WBC 10.1 06/29/2020   HGB 12.7 06/29/2020   PLT 409.0 (H) 06/29/2020   NA 137 06/29/2020   K 3.3 (L) 06/29/2020   CL 101 06/29/2020   CO2 28 06/29/2020   GLUCOSE 191 (H) 06/29/2020   BUN 8 06/29/2020   CREATININE 0.78 06/29/2020   BILITOT 0.3 06/29/2020   ALKPHOS 114 06/29/2020   AST 12 06/29/2020   ALT 14 06/29/2020   PROT 7.8 06/29/2020   ALBUMIN 4.1 06/29/2020   CALCIUM 9.2 06/29/2020   GFRAA >60 12/28/2019    Speciality Comments:  No specialty comments available.  Procedures:  No procedures performed Allergies: Patient has no known allergies.   Assessment / Plan:     Visit Diagnoses: Fibromyalgia - She has generalized hyperalgesia and positive tender points on exam.  She is currently having a fibromyalgia flare.  She continues to have intermittent myalgias and muscle tenderness.  She takes gabapentin 600 mg at bedtime and continues to use Voltaren gel topically and takes Tylenol as needed for pain relief.  She was previously taking tramadol which she found to be effective but could no longer afford the medication.  She had an updated UDS performed on 01/26/2020 which was negative for the use of tramadol.  She plans on following up with her PCP to discuss restarting on tramadol.  She declined a pain management referral at this time.  We discussed the importance of regular exercise and good sleep hygiene.  She declined physical therapy at this time.  She will follow-up in the office in 6 months.  Trochanteric bursitis of both hips: She has tenderness palpation over bilateral trochanteric bursa.  She has been performing daily stretching exercises but has not noticed much improvement.  She declined physical therapy at this time.  Pain in both wrists: She has  chronic pain in both wrists.  She has tenderness on the ulnar aspect of both wrists.  She declined obtaining autoimmune lab work today.  She was encouraged to use alternative topically as needed for pain relief.  She was advised to notify us if her discomfort persists or worsens.   Primary osteoarthritis of both knees: She has good range of motion of both knee joints on exam.  No warmth or effusion was noted.  She has bilateral knee crepitus.  She was encouraged to continue to use Voltaren gel topically as needed for pain relief.  Other fatigue: Chronic.  We discussed the importance of regular exercise.   Primary insomnia - She takes Ambien 5 mg by mouth at bedtime for insomnia.  Good sleep hygiene was discussed.   ANA positive: She has recurrent nasal ulcerations but no oral ulcerations.  She has no other clinical features of autoimmune disease at this time.  She declined updated lab work today.  Vitamin D deficiency -Vitamin D was 16 on 01/26/2020.  We will recheck vitamin D level today.  Plan: VITAMIN D 25 Hydroxy (Vit-D Deficiency, Fractures)  Other medical conditions are listed as follows:  History of diabetes mellitus  History of anemia  History of gastroesophageal reflux (GERD)  Orders: Orders Placed This Encounter  Procedures  . VITAMIN D 25 Hydroxy (Vit-D Deficiency, Fractures)   No orders of the defined types were placed in this encounter.     Follow-Up Instructions: Return in about 6 months (around 01/25/2021) for Fibromyalgia, Osteoarthritis.   Ofilia Neas, PA-C  Note - This record has been created using Dragon software.  Chart creation errors have been sought, but may not always  have been located. Such creation errors do not reflect on  the standard of medical care.

## 2020-07-12 ENCOUNTER — Ambulatory Visit (HOSPITAL_COMMUNITY): Payer: PPO

## 2020-07-13 DIAGNOSIS — G4733 Obstructive sleep apnea (adult) (pediatric): Secondary | ICD-10-CM | POA: Diagnosis not present

## 2020-07-18 ENCOUNTER — Other Ambulatory Visit: Payer: Self-pay

## 2020-07-18 MED ORDER — DOXYCYCLINE HYCLATE 100 MG PO TBEC: 100.0000 mg | DELAYED_RELEASE_TABLET | Freq: Two times a day (BID) | ORAL | 0 refills | Status: DC

## 2020-07-18 MED ORDER — METRONIDAZOLE 250 MG PO TABS
250.0000 mg | ORAL_TABLET | Freq: Four times a day (QID) | ORAL | 0 refills | Status: DC
Start: 2020-07-18 — End: 2021-02-23

## 2020-07-18 MED ORDER — BISMUTH 262 MG PO CHEW: 524.0000 mg | CHEWABLE_TABLET | Freq: Four times a day (QID) | ORAL | 0 refills | Status: DC

## 2020-07-25 ENCOUNTER — Ambulatory Visit: Payer: PPO | Admitting: Physician Assistant

## 2020-07-25 ENCOUNTER — Other Ambulatory Visit: Payer: Self-pay

## 2020-07-25 ENCOUNTER — Encounter: Payer: Self-pay | Admitting: Physician Assistant

## 2020-07-25 VITALS — BP 123/87 | HR 113 | Resp 15 | Ht 65.0 in | Wt 233.0 lb

## 2020-07-25 DIAGNOSIS — M7061 Trochanteric bursitis, right hip: Secondary | ICD-10-CM

## 2020-07-25 DIAGNOSIS — F5101 Primary insomnia: Secondary | ICD-10-CM

## 2020-07-25 DIAGNOSIS — R5383 Other fatigue: Secondary | ICD-10-CM

## 2020-07-25 DIAGNOSIS — Z862 Personal history of diseases of the blood and blood-forming organs and certain disorders involving the immune mechanism: Secondary | ICD-10-CM

## 2020-07-25 DIAGNOSIS — Z8719 Personal history of other diseases of the digestive system: Secondary | ICD-10-CM

## 2020-07-25 DIAGNOSIS — M170 Bilateral primary osteoarthritis of knee: Secondary | ICD-10-CM | POA: Diagnosis not present

## 2020-07-25 DIAGNOSIS — M7062 Trochanteric bursitis, left hip: Secondary | ICD-10-CM | POA: Diagnosis not present

## 2020-07-25 DIAGNOSIS — R768 Other specified abnormal immunological findings in serum: Secondary | ICD-10-CM

## 2020-07-25 DIAGNOSIS — M25531 Pain in right wrist: Secondary | ICD-10-CM | POA: Diagnosis not present

## 2020-07-25 DIAGNOSIS — M797 Fibromyalgia: Secondary | ICD-10-CM

## 2020-07-25 DIAGNOSIS — Z8639 Personal history of other endocrine, nutritional and metabolic disease: Secondary | ICD-10-CM

## 2020-07-25 DIAGNOSIS — E559 Vitamin D deficiency, unspecified: Secondary | ICD-10-CM

## 2020-07-25 DIAGNOSIS — M25532 Pain in left wrist: Secondary | ICD-10-CM

## 2020-07-25 LAB — VITAMIN D 25 HYDROXY (VIT D DEFICIENCY, FRACTURES): Vit D, 25-Hydroxy: 44 ng/mL (ref 30–100)

## 2020-07-26 NOTE — Progress Notes (Signed)
Demand is normal.  Patient should take vitamin D 2000 units daily now.  Repeat vitamin D in 6 months

## 2020-07-28 ENCOUNTER — Other Ambulatory Visit: Payer: Self-pay

## 2020-07-28 ENCOUNTER — Ambulatory Visit (HOSPITAL_COMMUNITY)
Admission: RE | Admit: 2020-07-28 | Discharge: 2020-07-28 | Disposition: A | Discharge status: Home / Self Care | Payer: PPO | Source: Ambulatory Visit | Attending: Gastroenterology | Admitting: Gastroenterology

## 2020-07-28 ENCOUNTER — Encounter (HOSPITAL_COMMUNITY): Payer: Self-pay

## 2020-07-28 DIAGNOSIS — R1013 Epigastric pain: Secondary | ICD-10-CM | POA: Insufficient documentation

## 2020-07-28 DIAGNOSIS — R634 Abnormal weight loss: Secondary | ICD-10-CM | POA: Insufficient documentation

## 2020-07-28 DIAGNOSIS — K760 Fatty (change of) liver, not elsewhere classified: Secondary | ICD-10-CM | POA: Diagnosis not present

## 2020-07-28 DIAGNOSIS — R1030 Lower abdominal pain, unspecified: Secondary | ICD-10-CM | POA: Diagnosis not present

## 2020-07-28 MED ORDER — SODIUM CHLORIDE (PF) 0.9 % IJ SOLN
INTRAMUSCULAR | Status: AC
  Filled 2020-07-28: qty 50

## 2020-07-28 MED ORDER — IOHEXOL 300 MG/ML  SOLN
100.0000 mL | Freq: Once | INTRAMUSCULAR | Status: AC | PRN
  Administered 2020-07-28: 100 mL via INTRAVENOUS

## 2020-07-31 ENCOUNTER — Other Ambulatory Visit: Payer: Self-pay | Admitting: Nurse Practitioner

## 2020-08-09 DIAGNOSIS — Z0000 Encounter for general adult medical examination without abnormal findings: Secondary | ICD-10-CM | POA: Diagnosis not present

## 2020-08-09 DIAGNOSIS — I10 Essential (primary) hypertension: Secondary | ICD-10-CM | POA: Diagnosis not present

## 2020-08-09 DIAGNOSIS — M797 Fibromyalgia: Secondary | ICD-10-CM | POA: Diagnosis not present

## 2020-08-09 DIAGNOSIS — G4733 Obstructive sleep apnea (adult) (pediatric): Secondary | ICD-10-CM | POA: Diagnosis not present

## 2020-08-09 DIAGNOSIS — N3281 Overactive bladder: Secondary | ICD-10-CM | POA: Diagnosis not present

## 2020-08-09 DIAGNOSIS — Z6837 Body mass index (BMI) 37.0-37.9, adult: Secondary | ICD-10-CM | POA: Diagnosis not present

## 2020-08-09 DIAGNOSIS — Z1389 Encounter for screening for other disorder: Secondary | ICD-10-CM | POA: Diagnosis not present

## 2020-08-09 DIAGNOSIS — E1169 Type 2 diabetes mellitus with other specified complication: Secondary | ICD-10-CM | POA: Diagnosis not present

## 2020-08-09 DIAGNOSIS — F411 Generalized anxiety disorder: Secondary | ICD-10-CM | POA: Diagnosis not present

## 2020-08-10 ENCOUNTER — Other Ambulatory Visit: Payer: Self-pay | Admitting: Physician Assistant

## 2020-08-10 ENCOUNTER — Other Ambulatory Visit: Payer: Self-pay | Admitting: Rheumatology

## 2020-08-10 NOTE — Telephone Encounter (Signed)
We will refill tramadol and Ambien only one time.  We are transferring patients to pain management who will continue to need these medications.  She may transfer her prescriptions to her PCP or we can refer her to pain management.

## 2020-08-10 NOTE — Telephone Encounter (Signed)
Last Visit: 07/25/2020 Next Visit: 01/23/2021 UDS: 01/26/2020 Narc Agreement: 01/26/2020  Last Fill: 02/29/2020  Okay to refill tramadol?

## 2020-08-10 NOTE — Telephone Encounter (Signed)
Last Visit: 07/25/2020 Next Visit: 01/23/2021  Last Fill: 06/21/2020  Okay to refill Ambien?

## 2020-08-12 NOTE — Telephone Encounter (Signed)
Attempted to contact patient and left message for patient to call the office.  

## 2020-08-12 NOTE — Telephone Encounter (Signed)
Patient advised we will refill tramadol and Ambien only one time.  Patient advised we are transferring patients to pain management who will continue to need these medications. She may transfer her prescriptions to her PCP or we can refer her to pain management. Patient states she will have her PCP take over prescriptions and she will contact her PCP.

## 2020-08-13 DIAGNOSIS — G4733 Obstructive sleep apnea (adult) (pediatric): Secondary | ICD-10-CM | POA: Diagnosis not present

## 2020-08-14 ENCOUNTER — Other Ambulatory Visit: Payer: Self-pay | Admitting: Nurse Practitioner

## 2020-08-17 ENCOUNTER — Ambulatory Visit: Payer: PPO | Admitting: Gastroenterology

## 2020-08-24 DIAGNOSIS — G4733 Obstructive sleep apnea (adult) (pediatric): Secondary | ICD-10-CM | POA: Diagnosis not present

## 2020-09-13 DIAGNOSIS — G4733 Obstructive sleep apnea (adult) (pediatric): Secondary | ICD-10-CM | POA: Diagnosis not present

## 2020-09-15 ENCOUNTER — Other Ambulatory Visit: Payer: Self-pay | Admitting: Rheumatology

## 2020-09-21 ENCOUNTER — Telehealth: Payer: Self-pay

## 2020-09-21 DIAGNOSIS — N3281 Overactive bladder: Secondary | ICD-10-CM | POA: Diagnosis not present

## 2020-09-21 DIAGNOSIS — Z23 Encounter for immunization: Secondary | ICD-10-CM | POA: Diagnosis not present

## 2020-09-21 DIAGNOSIS — M549 Dorsalgia, unspecified: Secondary | ICD-10-CM | POA: Diagnosis not present

## 2020-09-21 DIAGNOSIS — G4700 Insomnia, unspecified: Secondary | ICD-10-CM | POA: Diagnosis not present

## 2020-09-21 NOTE — Telephone Encounter (Signed)
Patient called requesting prescription refill of Zolpidem to be sent to [facility] at [facility].

## 2020-09-21 NOTE — Telephone Encounter (Signed)
Patient advised as discussed previously we would no longer be prescribing her Ambien as it was to be transferred to her PCP.

## 2020-10-13 DIAGNOSIS — G4733 Obstructive sleep apnea (adult) (pediatric): Secondary | ICD-10-CM | POA: Diagnosis not present

## 2020-11-13 DIAGNOSIS — G4733 Obstructive sleep apnea (adult) (pediatric): Secondary | ICD-10-CM | POA: Diagnosis not present

## 2020-11-23 DIAGNOSIS — G4733 Obstructive sleep apnea (adult) (pediatric): Secondary | ICD-10-CM | POA: Diagnosis not present

## 2020-12-29 NOTE — Progress Notes (Addendum)
Office Visit Note  Patient: [name]             Date of Birth: 1971/05/30           MRN: [id number]             PCP: Donald Prose, MD Referring: Donald Prose, MD Visit Date: 12/30/2020 Occupation: @GUAROCC @  Subjective:  Right shoulder joint pain   History of Present Illness: [name] is a 50 y.o. female with history of fibromyalgia and osteoarthritis. She presents today with pain in the right shoulder joint which started on 12/10/2020.  She denies any injuries prior to the onset of symptoms.  She denies any overuse or overhead activities recently.  She states that she was not decorating for Christmas, doing excessive cleaning or cooking around that time.  She states that she woke up with the pain in her right shoulder.  The discomfort has progressively been worsening over the past several weeks.  She is not experiencing nocturnal pain and difficulty raising her right arm.  She has tried using Voltaren gel topically as well as taking tramadol 50 mg 2 tablets by mouth daily for pain relief.  She has also tried using a heating pad but has not noticed any improvement in her symptoms.  She denies any trapezius muscle tension and muscle tenderness.  She states that she has never experienced discomfort like this in her shoulder joint.  She states her left shoulder is doing well and is not having any discomfort at this time.  She denies any recent fibromyalgia flares.  She has intermittent discomfort due to trochanteric bursitis of both hips.  She has been performing stretching exercises on a regular basis which alleviates her discomfort.  She continues to take gabapentin 600 mg by mouth at bedtime and Ambien 5 mg by mouth at bedtime for insomnia.  Her level of fatigue has been stable recently.    Activities of Daily Living:  Patient reports morning stiffness for 2   hour.   Patient Reports nocturnal pain.  Difficulty dressing/grooming: Denies Difficulty climbing stairs:  Reports Difficulty getting out of chair: Denies Difficulty using hands for taps, buttons, cutlery, and/or writing: Denies  Review of Systems  Constitutional: Positive for fatigue.  HENT: Positive for mouth dryness. Negative for mouth sores and nose dryness (Nasal ulcers).   Eyes: Negative for pain, visual disturbance and dryness.  Respiratory: Positive for cough. Negative for hemoptysis, shortness of breath and difficulty breathing.   Cardiovascular: Negative for chest pain, palpitations, hypertension and swelling in legs/feet.  Gastrointestinal: Negative for blood in stool, constipation and diarrhea.  Endocrine: Negative for increased urination.  Genitourinary: Negative for painful urination.  Musculoskeletal: Negative for arthralgias, joint pain, joint swelling, myalgias, muscle weakness, morning stiffness, muscle tenderness and myalgias.  Skin: Negative for color change, pallor, rash, hair loss, nodules/bumps, skin tightness, ulcers and sensitivity to sunlight.  Allergic/Immunologic: Negative for susceptible to infections.  Neurological: Negative for dizziness, numbness, headaches and weakness.  Hematological: Negative for swollen glands.  Psychiatric/Behavioral: Positive for sleep disturbance. Negative for depressed mood. The patient is not nervous/anxious.     PMFS History:  Patient Active Problem List   Diagnosis Date Noted  . Fibromyalgia 12/25/2016  . Other fatigue 12/25/2016  . Primary insomnia 12/25/2016  . Primary osteoarthritis of both knees 12/25/2016  . ANA positive 12/25/2016  . History of gastroesophageal reflux (GERD) 12/25/2016  . History of anemia 12/25/2016  . Trochanteric bursitis of both hips 12/25/2016  .  Anemia, iron deficiency 04/18/2015  . Dysphagia, pharyngoesophageal phase 11/14/2011  . Esophageal reflux 11/14/2011    Past Medical History:  Diagnosis Date  . ADD (attention deficit disorder with hyperactivity)   . Allergic rhinitis   . Anxiety   .  Arthritis   . Depression   . Dysmenorrhea   . Fibromyalgia   . GERD (gastroesophageal reflux disease)   . H. pylori infection    per patient   . HLD (hyperlipidemia)   . HTN (hypertension)   . Insomnia   . Irritable bowel syndrome   . Restless leg syndrome   . Sleep apnea     Family History  Problem Relation Age of Onset  . Breast cancer Maternal Grandmother   . Diabetes Sister   . Irritable bowel syndrome Sister   . Heart disease Brother   . Heart attack Brother   . Stroke Mother   . Heart disease Mother   . Diabetes Sister   . Irritable bowel syndrome Sister   . Fibromyalgia Daughter   . Colon cancer Neg Hx   . Colon polyps Neg Hx   . Esophageal cancer Neg Hx   . Kidney disease Neg Hx   . Gallbladder disease Neg Hx   . Stomach cancer Neg Hx    Past Surgical History:  Procedure Laterality Date  . BUNIONECTOMY Left 2008  . DILATION AND CURETTAGE OF UTERUS  2004  . EAR CYST EXCISION Left 1983  . KNEE ARTHROSCOPY Right    with knee cap adjustment  . UPPER GASTROINTESTINAL ENDOSCOPY  06/29/2020   Social History   Social History Narrative  . Not on file   Immunization History  Administered Date(s) Administered  . PFIZER SARS-COV-2 Vaccination 03/11/2020, 04/01/2020, 12/08/2020     Objective: Vital Signs: BP 127/87 (BP Location: Left Arm, Patient Position: Sitting, Cuff Size: Normal)   Pulse 96   Ht 5\' 5"  (1.651 m)   Wt 220 lb 9.6 oz (100.1 kg)   BMI 36.71 kg/m    Physical Exam Vitals and nursing note reviewed.  Constitutional:      Appearance: She is well-developed and well-nourished.  HENT:     Head: Normocephalic and atraumatic.  Eyes:     Extraocular Movements: EOM normal.     Conjunctiva/sclera: Conjunctivae normal.  Cardiovascular:     Pulses: Intact distal pulses.  Pulmonary:     Effort: Pulmonary effort is normal.  Abdominal:     Palpations: Abdomen is soft.  Musculoskeletal:     Cervical back: Normal range of motion.  Skin:    General:  Skin is warm and dry.     Capillary Refill: Capillary refill takes less than 2 seconds.  Neurological:     Mental Status: She is alert and oriented to person, place, and time.  Psychiatric:        Mood and Affect: Mood and affect normal.        Behavior: Behavior normal.      Musculoskeletal Exam: C-spine, thoracic spine, and lumbar spine good ROM.  No midline spinal tenderness.  No SI joint tenderness.  Right shoulder limited abduction to about 45 degrees.  Painful and limited external and internal rotation.  Left shoulder has full ROM with no discomfort.  Elbow joints, wrist joints, MCPs, PIPs, and DIPs good ROM with no synovitis.  She has complete fist formation bilaterally.  Hip joints, knee joints, and ankle joints good ROM with no discomfort.  No warmth or effusion of knee joints.  No  tenderness or swelling of ankle joints.   CDAI Exam: CDAI Score: - Patient Global: -; Provider Global: - Swollen: -; Tender: - Joint Exam 12/30/2020   No joint exam has been documented for this visit   There is currently no information documented on the homunculus. Go to the Rheumatology activity and complete the homunculus joint exam.  Investigation: No additional findings.  Imaging: No results found.  Recent Labs: Lab Results  Component Value Date   WBC 10.1 06/29/2020   HGB 12.7 06/29/2020   PLT 409.0 (H) 06/29/2020   NA 137 06/29/2020   K 3.3 (L) 06/29/2020   CL 101 06/29/2020   CO2 28 06/29/2020   GLUCOSE 191 (H) 06/29/2020   BUN 8 06/29/2020   CREATININE 0.78 06/29/2020   BILITOT 0.3 06/29/2020   ALKPHOS 114 06/29/2020   AST 12 06/29/2020   ALT 14 06/29/2020   PROT 7.8 06/29/2020   ALBUMIN 4.1 06/29/2020   CALCIUM 9.2 06/29/2020   GFRAA >60 12/28/2019    Speciality Comments: No specialty comments available.  Procedures:  Large Joint Inj: R subacromial bursa on 12/30/2020 11:37 AM Indications: pain Details: 27 G 1.5 in needle, posterior approach  Arthrogram:  No  Medications: 1.5 mL lidocaine 1 %; 40 mg triamcinolone acetonide 40 MG/ML Aspirate: 0 mL Outcome: tolerated well, no immediate complications Procedure, treatment alternatives, risks and benefits explained, specific risks discussed. Consent was given by the patient. Immediately prior to procedure a time out was called to verify the correct patient, procedure, equipment, support staff and site/side marked as required. Patient was prepped and draped in the usual sterile fashion.     Allergies: Patient has no known allergies.   Assessment / Plan:     Visit Diagnoses: Fibromyalgia - She has not had any recent fibromyalgia flares.  Her level of fatigue has been stable recently.  She continues to take Ambien 5 mg 1 tablet by mouth at bedtime for insomnia.  She has also been taking gabapentin 600 mg at bedtime as prescribed.  She has intermittent discomfort due to trochanter bursitis of both hips but has been performing stretching exercises on a daily basis which has alleviated her discomfort.  She presents today with right shoulder joint pain.  She has no trapezius muscle tension or muscle tenderness at this time.  X-rays of the right shoulder were obtained and the right shoulder was injected with cortisone.  She plans on continuing to take tramadol 50 mg 2 tablets daily as needed for pain relief.  She was given a handout of shoulder joint exercises.  Discussed the importance of regular exercise and good sleep hygiene.  She will follow up in 6 months.    Trochanteric bursitis of both hips: She experiences intermittent discomfort due to trochanter bursitis of both hips.  No tenderness palpation on examination today.  She was encouraged to continue to perform stretching exercises on a daily basis.  Primary osteoarthritis of both knees: She has good ROM at both knee joints with no discomfort.  No warmth or effusion was noted.  She is not having a discomfort in her knees at this time.  Other fatigue:  Chronic and secondary to insomnia.   Primary insomnia -She takes Ambien 5 mg by mouth at bedtime for insomnia.   ANA positive: She has no clinical features of systemic lupus.  She has not had any recent rashes, hair loss, symptoms of Raynaud's, oral ulcers.  She has noticed intermittent nasal ulcers and mouth dryness.   Vitamin  D deficiency: She was advised to take a maintenance dose of vitamin D 1,000 units daily.  Chronic right shoulder pain - She presents today with right shoulder joint pain which started on 12/10/2020.  She had not had any injury prior to the onset of symptoms.  According to the patient she woke up with the pain.  Her discomfort has progressively been worsening over the past several weeks.  She has had increased nocturnal pain and difficulty raising her right arm.  She has never experienced shoulder pain like this in the past.  She has tried taking tramadol 50 mg 2 tablets by mouth daily for pain relief and has also been using Voltaren gel topically as needed.  She has also tried a heating pad but has not noticed any improvement in her symptoms.  X-rays of the right shoulder were obtained today.  The x-ray of the shoulder joint was unremarkable.  She had tenderness in the subacromial region.  Right subacromial cortisone injection was performed today.  She tolerated the procedure well.  Procedure note was completed above.  Aftercare was discussed.  She was advised to monitor blood glucose level and blood pressure closely following the injection.  She was advised to follow-up with her PCP if she notices an elevation in her blood sugar.  She was given a handout of shoulder joint exercises to perform.  She was advised to notify us if her discomfort persists or worsens.  Plan: XR Shoulder Right, Large Joint Inj: R subacromial bursa  Other medical conditions are listed as follows:   History of anemia  History of diabetes mellitus  History of gastroesophageal reflux  (GERD)    Orders: Orders Placed This Encounter  Procedures  . Large Joint Inj: R subacromial bursa  . XR Shoulder Right   No orders of the defined types were placed in this encounter.    Follow-Up Instructions: Return in about 6 months (around 06/29/2021) for Fibromyalgia, Osteoarthritis.   Ofilia Neas, PA-C  Note - This record has been created using Dragon software.  Chart creation errors have been sought, but may not always  have been located. Such creation errors do not reflect on  the standard of medical care.

## 2020-12-30 ENCOUNTER — Other Ambulatory Visit: Payer: Self-pay

## 2020-12-30 ENCOUNTER — Encounter: Payer: Self-pay | Admitting: Physician Assistant

## 2020-12-30 ENCOUNTER — Ambulatory Visit: Payer: Self-pay

## 2020-12-30 ENCOUNTER — Ambulatory Visit: Payer: PPO | Admitting: Physician Assistant

## 2020-12-30 VITALS — BP 127/87 | HR 96 | Ht 65.0 in | Wt 220.6 lb

## 2020-12-30 DIAGNOSIS — M7061 Trochanteric bursitis, right hip: Secondary | ICD-10-CM

## 2020-12-30 DIAGNOSIS — Z8719 Personal history of other diseases of the digestive system: Secondary | ICD-10-CM

## 2020-12-30 DIAGNOSIS — M170 Bilateral primary osteoarthritis of knee: Secondary | ICD-10-CM | POA: Diagnosis not present

## 2020-12-30 DIAGNOSIS — R5383 Other fatigue: Secondary | ICD-10-CM

## 2020-12-30 DIAGNOSIS — M25511 Pain in right shoulder: Secondary | ICD-10-CM | POA: Diagnosis not present

## 2020-12-30 DIAGNOSIS — R768 Other specified abnormal immunological findings in serum: Secondary | ICD-10-CM | POA: Diagnosis not present

## 2020-12-30 DIAGNOSIS — M797 Fibromyalgia: Secondary | ICD-10-CM | POA: Diagnosis not present

## 2020-12-30 DIAGNOSIS — E559 Vitamin D deficiency, unspecified: Secondary | ICD-10-CM | POA: Diagnosis not present

## 2020-12-30 DIAGNOSIS — F5101 Primary insomnia: Secondary | ICD-10-CM | POA: Diagnosis not present

## 2020-12-30 DIAGNOSIS — R7689 Other specified abnormal immunological findings in serum: Secondary | ICD-10-CM

## 2020-12-30 DIAGNOSIS — Z8639 Personal history of other endocrine, nutritional and metabolic disease: Secondary | ICD-10-CM | POA: Diagnosis not present

## 2020-12-30 DIAGNOSIS — M7062 Trochanteric bursitis, left hip: Secondary | ICD-10-CM | POA: Diagnosis not present

## 2020-12-30 DIAGNOSIS — G8929 Other chronic pain: Secondary | ICD-10-CM

## 2020-12-30 DIAGNOSIS — Z862 Personal history of diseases of the blood and blood-forming organs and certain disorders involving the immune mechanism: Secondary | ICD-10-CM | POA: Diagnosis not present

## 2020-12-30 MED ORDER — LIDOCAINE HCL 1 % IJ SOLN
1.5000 mL | INTRAMUSCULAR | Status: AC | PRN
  Administered 2020-12-30: 1.5 mL

## 2020-12-30 MED ORDER — DICLOFENAC SODIUM 1 % EX GEL: CUTANEOUS | 2 refills | Status: DC

## 2020-12-30 MED ORDER — TRIAMCINOLONE ACETONIDE 40 MG/ML IJ SUSP
40.0000 mg | INTRAMUSCULAR | Status: AC | PRN
  Administered 2020-12-30: 40 mg via INTRA_ARTICULAR

## 2020-12-30 NOTE — Addendum Note (Signed)
Addended by: Earnestine Mealing on: 12/30/2020 01:52 PM   Modules accepted: Orders

## 2020-12-30 NOTE — Patient Instructions (Signed)
Shoulder Exercises Ask your health care provider which exercises are safe for you. Do exercises exactly as told by your health care provider and adjust them as directed. It is normal to feel mild stretching, pulling, tightness, or discomfort as you do these exercises. Stop right away if you feel sudden pain or your pain gets worse. Do not begin these exercises until told by your health care provider. Stretching exercises External rotation and abduction This exercise is sometimes called corner stretch. This exercise rotates your arm outward (external rotation) and moves your arm out from your body (abduction). 1. Stand in a doorway with one of your feet slightly in front of the other. This is called a staggered stance. If you cannot reach your forearms to the door frame, stand facing a corner of a room. 2. Choose one of the following positions as told by your health care provider: ? Place your hands and forearms on the door frame above your head. ? Place your hands and forearms on the door frame at the height of your head. ? Place your hands on the door frame at the height of your elbows. 3. Slowly move your weight onto your front foot until you feel a stretch across your chest and in the front of your shoulders. Keep your head and chest upright and keep your abdominal muscles tight. 4. Hold for __________ seconds. 5. To release the stretch, shift your weight to your back foot. Repeat __________ times. Complete this exercise __________ times a day.   Extension, standing 1. Stand and hold a broomstick, a cane, or a similar object behind your back. ? Your hands should be a little wider than shoulder width apart. ? Your palms should face away from your back. 2. Keeping your elbows straight and your shoulder muscles relaxed, move the stick away from your body until you feel a stretch in your shoulders (extension). ? Avoid shrugging your shoulders while you move the stick. Keep your shoulder blades  tucked down toward the middle of your back. 3. Hold for __________ seconds. 4. Slowly return to the starting position. Repeat __________ times. Complete this exercise __________ times a day. Range-of-motion exercises Pendulum 1. Stand near a wall or a surface that you can hold onto for balance. 2. Bend at the waist and let your left / right arm hang straight down. Use your other arm to support you. Keep your back straight and do not lock your knees. 3. Relax your left / right arm and shoulder muscles, and move your hips and your trunk so your left / right arm swings freely. Your arm should swing because of the motion of your body, not because you are using your arm or shoulder muscles. 4. Keep moving your hips and trunk so your arm swings in the following directions, as told by your health care provider: ? Side to side. ? Forward and backward. ? In clockwise and counterclockwise circles. 5. Continue each motion for __________ seconds, or for as long as told by your health care provider. 6. Slowly return to the starting position. Repeat __________ times. Complete this exercise __________ times a day.   Shoulder flexion, standing 1. Stand and hold a broomstick, a cane, or a similar object. Place your hands a little more than shoulder width apart on the object. Your left / right hand should be palm up, and your other hand should be palm down. 2. Keep your elbow straight and your shoulder muscles relaxed. Push the stick up with your healthy   arm to raise your left / right arm in front of your body, and then over your head until you feel a stretch in your shoulder (flexion). ? Avoid shrugging your shoulder while you raise your arm. Keep your shoulder blade tucked down toward the middle of your back. 3. Hold for __________ seconds. 4. Slowly return to the starting position. Repeat __________ times. Complete this exercise __________ times a day.   Shoulder abduction, standing 1. Stand and hold a  broomstick, a cane, or a similar object. Place your hands a little more than shoulder width apart on the object. Your left / right hand should be palm up, and your other hand should be palm down. 2. Keep your elbow straight and your shoulder muscles relaxed. Push the object across your body toward your left / right side. Raise your left / right arm to the side of your body (abduction) until you feel a stretch in your shoulder. ? Do not raise your arm above shoulder height unless your health care provider tells you to do that. ? If directed, raise your arm over your head. ? Avoid shrugging your shoulder while you raise your arm. Keep your shoulder blade tucked down toward the middle of your back. 3. Hold for __________ seconds. 4. Slowly return to the starting position. Repeat __________ times. Complete this exercise __________ times a day. Internal rotation 1. Place your left / right hand behind your back, palm up. 2. Use your other hand to dangle an exercise band, a towel, or a similar object over your shoulder. Grasp the band with your left / right hand so you are holding on to both ends. 3. Gently pull up on the band until you feel a stretch in the front of your left / right shoulder. The movement of your arm toward the center of your body is called internal rotation. ? Avoid shrugging your shoulder while you raise your arm. Keep your shoulder blade tucked down toward the middle of your back. 4. Hold for __________ seconds. 5. Release the stretch by letting go of the band and lowering your hands. Repeat __________ times. Complete this exercise __________ times a day.   Strengthening exercises External rotation 1. Sit in a stable chair without armrests. 2. Secure an exercise band to a stable object at elbow height on your left / right side. 3. Place a soft object, such as a folded towel or a small pillow, between your left / right upper arm and your body to move your elbow about 4 inches (10  cm) away from your side. 4. Hold the end of the exercise band so it is tight and there is no slack. 5. Keeping your elbow pressed against the soft object, slowly move your forearm out, away from your abdomen (external rotation). Keep your body steady so only your forearm moves. 6. Hold for __________ seconds. 7. Slowly return to the starting position. Repeat __________ times. Complete this exercise __________ times a day.   Shoulder abduction 1. Sit in a stable chair without armrests, or stand up. 2. Hold a __________ weight in your left / right hand, or hold an exercise band with both hands. 3. Start with your arms straight down and your left / right palm facing in, toward your body. 4. Slowly lift your left / right hand out to your side (abduction). Do not lift your hand above shoulder height unless your health care provider tells you that this is safe. ? Keep your arms straight. ? Avoid   shrugging your shoulder while you do this movement. Keep your shoulder blade tucked down toward the middle of your back. 5. Hold for __________ seconds. 6. Slowly lower your arm, and return to the starting position. Repeat __________ times. Complete this exercise __________ times a day.   Shoulder extension 1. Sit in a stable chair without armrests, or stand up. 2. Secure an exercise band to a stable object in front of you so it is at shoulder height. 3. Hold one end of the exercise band in each hand. Your palms should face each other. 4. Straighten your elbows and lift your hands up to shoulder height. 5. Step back, away from the secured end of the exercise band, until the band is tight and there is no slack. 6. Squeeze your shoulder blades together as you pull your hands down to the sides of your thighs (extension). Stop when your hands are straight down by your sides. Do not let your hands go behind your body. 7. Hold for __________ seconds. 8. Slowly return to the starting position. Repeat __________  times. Complete this exercise __________ times a day. Shoulder row 1. Sit in a stable chair without armrests, or stand up. 2. Secure an exercise band to a stable object in front of you so it is at waist height. 3. Hold one end of the exercise band in each hand. Position your palms so that your thumbs are facing the ceiling (neutral position). 4. Bend each of your elbows to a 90-degree angle (right angle) and keep your upper arms at your sides. 5. Step back until the band is tight and there is no slack. 6. Slowly pull your elbows back behind you. 7. Hold for __________ seconds. 8. Slowly return to the starting position. Repeat __________ times. Complete this exercise __________ times a day. Shoulder press-ups 1. Sit in a stable chair that has armrests. Sit upright, with your feet flat on the floor. 2. Put your hands on the armrests so your elbows are bent and your fingers are pointing forward. Your hands should be about even with the sides of your body. 3. Push down on the armrests and use your arms to lift yourself off the chair. Straighten your elbows and lift yourself up as much as you comfortably can. ? Move your shoulder blades down, and avoid letting your shoulders move up toward your ears. ? Keep your feet on the ground. As you get stronger, your feet should support less of your body weight as you lift yourself up. 4. Hold for __________ seconds. 5. Slowly lower yourself back into the chair. Repeat __________ times. Complete this exercise __________ times a day.   Wall push-ups 1. Stand so you are facing a stable wall. Your feet should be about one arm-length away from the wall. 2. Lean forward and place your palms on the wall at shoulder height. 3. Keep your feet flat on the floor as you bend your elbows and lean forward toward the wall. 4. Hold for __________ seconds. 5. Straighten your elbows to push yourself back to the starting position. Repeat __________ times. Complete this  exercise __________ times a day.   This information is not intended to replace advice given to you by your health care provider. Make sure you discuss any questions you have with your health care provider. Document Revised: 03/27/2019 Document Reviewed: 01/02/2019 Elsevier Patient Education  2021 [employer]  

## 2021-01-23 ENCOUNTER — Ambulatory Visit: Payer: PPO | Admitting: Physician Assistant

## 2021-01-25 MED ORDER — OMEPRAZOLE 20 MG PO CPDR: 40.0000 mg | DELAYED_RELEASE_CAPSULE | Freq: Every day | ORAL | 1 refills | Status: DC

## 2021-01-25 MED ORDER — ONDANSETRON HCL 4 MG PO TABS: 4.0000 mg | ORAL_TABLET | Freq: Four times a day (QID) | ORAL | 0 refills | Status: DC | PRN

## 2021-01-25 NOTE — Telephone Encounter (Signed)
Spoke with patient, she has been scheduled for a follow up with Dr. Loletha Carrow on Thursday, 02/23/21 at 4 PM. Reviewed with patient that she will need to take the increased dose of Omeprazole for 1 week then hold it for 5 days before completing the breath test. Advised that I will place written orders at the front desk for her to pick up along with the information to [facility]. Patient is aware that she can complete the breath test after 2/21. Refill for Zofran has been sent to pharmacy on file. Patient verbalized understanding of all information and had no concerns at the end of the call.

## 2021-01-25 NOTE — Telephone Encounter (Signed)
Difficult to say if it is persistent H. pylori, flare of your reflux or acute infectious illness/gastroenteritis.  My apologies if we miscommunicated about the need to do a breath test to confirm H. pylori eradication. Patients need to be off their acid suppression medicine 5 days prior to the test in order to get an accurate result for that.  My recommendations are the following:  We will send prescription for omeprazole 20 mg so you can take 2 tablets twice daily for the next week in hopes this get your symptoms under better control. Then I would like you to hold the medicine for 5 days and have the "urea breath test" performed, after which you should resume the omeprazole 20 mg at one tablet twice daily as you have been doing.  If you still have a supply of ondansetron 4 mg to take as needed for nausea or vomiting, please do so. If you need a refill on that please let us know.  My nurse will also contact you to arrange clinic follow up.  Hope you feel better.  Dr. Loletha Carrow ___________________________  Herbert Seta,  Please see above.  I sent the omeprazole script.  - HD

## 2021-02-23 ENCOUNTER — Ambulatory Visit: Payer: Medicare Other | Admitting: Gastroenterology

## 2021-02-23 ENCOUNTER — Encounter: Payer: Self-pay | Admitting: Gastroenterology

## 2021-02-23 VITALS — BP 124/80 | HR 88 | Ht 65.0 in | Wt 221.4 lb

## 2021-02-23 DIAGNOSIS — R112 Nausea with vomiting, unspecified: Secondary | ICD-10-CM | POA: Diagnosis not present

## 2021-02-23 DIAGNOSIS — R109 Unspecified abdominal pain: Secondary | ICD-10-CM | POA: Diagnosis not present

## 2021-02-23 DIAGNOSIS — R053 Chronic cough: Secondary | ICD-10-CM

## 2021-02-23 NOTE — Patient Instructions (Signed)
If you are age 52 or older, your body mass index should be between 23-30. Your Body mass index is 36.84 kg/m. If this is out of the aforementioned range listed, please consider follow up with your Primary Care Provider.  If you are age 36 or younger, your body mass index should be between 19-25. Your Body mass index is 36.84 kg/m. If this is out of the aformentioned range listed, please consider follow up with your Primary Care Provider.   You will be contacted by [facility] in the next 2 days to arrange a Gastric Emptying Scan, and Upper GI series.  The number on your caller ID will be [phone], please answer when they call.  If you have not heard from them in 2 days please call [phone] to schedule.    We have sent a referral to [facility]. Please allow 2 weeks for them top contact you.  Follow up pending at this time.  It was a pleasure to see you today!  Dr. Loletha Carrow

## 2021-02-23 NOTE — Progress Notes (Signed)
[facility] Progress Note  Chief Complaint: Chronic abdominal pain with nausea and vomiting  Subjective  History: I first saw [name] in May 2018 for chronic burning epigastric and chest pain, she is a former patient of Dr. Deatra Ina.  She had previously been treated for H. pylori, and I ordered a urea breath test which was negative. She was seen in our office last July for chronic cough treated with amoxicillin by primary care and then progressing to unrelenting esophageal burning discomfort as well as epigastric burning pain. Upper endoscopy July 2019 with diffuse and mildly edematous gastric mucosa, biopsies positive for H. pylori.  She was then treated with bismuth based quadruple therapy and advised to have a follow-up breath test to confirm eradication.  Through some miscommunication, that test was not performed.  She contacted our office about a month ago complaining of vomiting and heartburn.  I prescribed a higher dose of omeprazole and ondansetron. Breath test was performed about 2 weeks ago and result is negative. ___________________  [name] is troubled by ongoing upper digestive symptoms but also a chronic cough.  She says that after she got antibiotics for H. pylori last summer, her cough significantly improved.  Then last fall the cough returned, it is sometimes dry other times productive of white sputum.  She does not feel as if she is wheezing, nor does she seem to get short of breath.  However, both during the day and overnight she will have coughing episodes to the point that she will then become nauseated and began to vomit.  She also has persistent nausea even between coughing episodes.  Her upper abdomen is "on fire" with a burning discomfort previously described.  [name] tells me primary care did a chest x-ray and was told it was clear.  She has not had additional imaging or pulmonary consult, nor any particular therapy directed at cough  suppression.  ROS: Cardiovascular:  no chest pain Respiratory: See above Chronic pain from fibromyalgia Remainder of systems negative except as above The patient's Past Medical, Family and Social History were reviewed and are on file in the EMR.  Objective:  Med list reviewed  Current Outpatient Medications:  .  albuterol (VENTOLIN HFA) 108 (90 Base) MCG/ACT inhaler, Inhale 2 puffs into the lungs in the morning, at noon, in the evening, and at bedtime., Disp: , Rfl:  .  amLODipine (NORVASC) 5 MG tablet, Take 5 mg by mouth daily., Disp: , Rfl:  .  diclofenac Sodium (VOLTAREN) 1 % GEL, Apply 2-4 grams to affected joint 4 times daily as needed., Disp: 400 g, Rfl: 2 .  escitalopram (LEXAPRO) 20 MG tablet, Take 20 mg by mouth at bedtime. , Disp: , Rfl:  .  gabapentin (NEURONTIN) 300 MG capsule, Take 600 mg by mouth at bedtime. , Disp: , Rfl:  .  lisinopril (ZESTRIL) 20 MG tablet, Take 20 mg by mouth daily., Disp: , Rfl:  .  metFORMIN (GLUCOPHAGE-XR) 500 MG 24 hr tablet, Take 500 mg by mouth every evening. , Disp: , Rfl:  .  omeprazole (PRILOSEC) 20 MG capsule, Take 2 capsules (40 mg total) by mouth daily., Disp: 120 capsule, Rfl: 1 .  ondansetron (ZOFRAN) 4 MG tablet, Take 1 tablet (4 mg total) by mouth every 6 (six) hours as needed for nausea or vomiting., Disp: 20 tablet, Rfl: 0 .  ONETOUCH VERIO test strip, 1 each daily., Disp: , Rfl:  .  OZEMPIC, 0.25 OR 0.5 MG/DOSE, 2 MG/1.5ML SOPN, Inject 0.25  mg into the skin at bedtime. , Disp: , Rfl:  .  PRAVASTATIN SODIUM PO, Take 1 tablet by mouth daily. , Disp: , Rfl:  .  traMADol (ULTRAM) 50 MG tablet, TAKE 2 TABLETS BY MOUTH EVERY DAY AT BEDTIME AS NEEDED FOR PAIN, Disp: 60 tablet, Rfl: 0 .  zolpidem (AMBIEN) 5 MG tablet, TAKE 1 TABLET (5 MG TOTAL) BY MOUTH AT BEDTIME AS NEEDED FOR SLEEP., Disp: 30 tablet, Rfl: 0   Vital signs in last 24 hrs: Vitals:   02/23/21 1558  BP: 124/80  Pulse: 88   Wt Readings from Last 3 Encounters:  02/23/21  221 lb 6.4 oz (100.4 kg)  12/30/20 220 lb 9.6 oz (100.1 kg)  07/25/20 233 lb (105.7 kg)    Physical Exam  Well-appearing, pleasant and conversational  HEENT: sclera anicteric, oral mucosa moist without lesions  Neck: supple, no thyromegaly, JVD or lymphadenopathy  Cardiac: RRR without murmurs, S1S2 heard, no peripheral edema  Pulm: clear to auscultation bilaterally, normal RR and effort noted  Abdomen: soft, bandlike upper tenderness to light palpation of abdominal wall, with active bowel sounds. No guarding or palpable hepatosplenomegaly.  Skin; warm and dry, no jaundice or rash  After laying back briefly for exam she was nauseated  Labs:   ___________________________________________ Radiologic studies:   ____________________________________________ Other:  Negative urea breath test with [facility], are resulted 02/14/2021 _____________________________________________ Assessment & Plan  Assessment: Encounter Diagnoses  Name Primary?  . Nausea and vomiting, intractability of vomiting not specified, unspecified vomiting type   . Chronic cough Yes  . Abdominal wall pain     Her upper digestive symptoms are still somewhat puzzling, and I cannot tell if she has primary reflux, delayed gastric emptying, or symptoms secondary to this chronic cough.  She clearly describes how coughing episodes become protracted and then she vomits.  There is some nausea independent of that as well, and I do believe she has some element of a functional upper digestive disorder.  Her upper abdominal wall pain is musculoskeletal and related to fibromyalgia, but also worsened by muscular strain from coughing.  Plan: Zofran works well for her, so she would like to continue it.  Gastric emptying study scheduled  Upper GI both supine and standing see if evidence of significant reflux  Referral to [facility] for chronic cough.  Nelida Meuse III

## 2021-03-03 ENCOUNTER — Encounter: Payer: Self-pay | Admitting: Rheumatology

## 2021-03-03 DIAGNOSIS — G8929 Other chronic pain: Secondary | ICD-10-CM

## 2021-03-03 NOTE — Telephone Encounter (Signed)
Please a schedule MRI of the shoulder joint to rule out rotator cuff tear.  Patient has been having severe pain without much relief from the cortisone injection.

## 2021-03-03 NOTE — Telephone Encounter (Signed)
Patient received a shoulder cortisone on 12/30/2020. Please advise.

## 2021-03-06 MED ORDER — ONDANSETRON HCL 4 MG PO TABS: 4.0000 mg | ORAL_TABLET | Freq: Four times a day (QID) | ORAL | 1 refills | Status: DC | PRN

## 2021-03-10 ENCOUNTER — Other Ambulatory Visit: Payer: Self-pay | Admitting: Gastroenterology

## 2021-03-10 ENCOUNTER — Ambulatory Visit (HOSPITAL_COMMUNITY)
Admission: RE | Admit: 2021-03-10 | Discharge: 2021-03-10 | Disposition: A | Discharge status: Home / Self Care | Payer: Medicare Other | Source: Ambulatory Visit | Attending: Gastroenterology | Admitting: Gastroenterology

## 2021-03-10 ENCOUNTER — Other Ambulatory Visit: Payer: Self-pay

## 2021-03-10 DIAGNOSIS — R112 Nausea with vomiting, unspecified: Secondary | ICD-10-CM

## 2021-03-16 ENCOUNTER — Other Ambulatory Visit: Payer: Self-pay | Admitting: *Deleted

## 2021-03-16 DIAGNOSIS — G8929 Other chronic pain: Secondary | ICD-10-CM

## 2021-03-22 ENCOUNTER — Other Ambulatory Visit: Payer: Self-pay

## 2021-03-22 ENCOUNTER — Encounter: Payer: Self-pay | Admitting: Orthopedic Surgery

## 2021-03-22 ENCOUNTER — Ambulatory Visit (INDEPENDENT_AMBULATORY_CARE_PROVIDER_SITE_OTHER): Payer: Medicare Other | Admitting: Orthopedic Surgery

## 2021-03-22 VITALS — Ht 65.0 in | Wt 219.0 lb

## 2021-03-22 DIAGNOSIS — M25511 Pain in right shoulder: Secondary | ICD-10-CM | POA: Diagnosis not present

## 2021-03-23 ENCOUNTER — Encounter (HOSPITAL_COMMUNITY)
Admission: RE | Admit: 2021-03-23 | Discharge: 2021-03-23 | Disposition: A | Discharge status: Home / Self Care | Payer: Medicare Other | Source: Ambulatory Visit | Attending: Gastroenterology | Admitting: Gastroenterology

## 2021-03-23 DIAGNOSIS — R112 Nausea with vomiting, unspecified: Secondary | ICD-10-CM | POA: Diagnosis not present

## 2021-03-23 MED ORDER — TECHNETIUM TC 99M SULFUR COLLOID
1.9000 | Freq: Once | INTRAVENOUS | Status: AC | PRN
  Administered 2021-03-23: 1.9 via INTRAVENOUS

## 2021-03-26 ENCOUNTER — Encounter: Payer: Self-pay | Admitting: Orthopedic Surgery

## 2021-03-26 NOTE — Progress Notes (Signed)
Office Visit Note   Patient: [name]           Date of Birth: 10/29/1971           MRN: [id number] Visit Date: 03/22/2021 Requested by: Bo Merino, MD [address] [place],  [place] [address] PCP: Donald Prose, MD  Subjective: Chief Complaint  Patient presents with  . Right Shoulder - Pain    HPI: [name] is a 50 year old patient with right shoulder pain [name] is a 50 year old patient with right shoulder pain.  No discrete injury but she felt like she may have aggravated it when she was vacuuming a lot.  Has a history of fibromyalgia.  Naproxen not helpful.  She is disabled from fibromyalgia.  Describes decreased range of motion.  Radiographs unremarkable which are reviewed.  MRI scan is reviewed and it does show some tendinosis of the supraspinatus tendon with a small partial-thickness articular sided tear as well as some tendinosis of the intra-articular portion of the biceps.  She reports occasional radiation into the hand with mild neck pain.              ROS: All systems reviewed are negative as they relate to the chief complaint within the history of present illness.  Patient denies  fevers or chills.   Assessment & Plan: Visit Diagnoses:  1. Right shoulder pain, unspecified chronicity     Plan: Impression is right shoulder adhesive capsulitis with partial-thickness rotator cuff tearing and possible intra-articular biceps pathology.  Plan is fluoroscopic guided injection into the right shoulder with Dr. Ernestina Patches.  Start physical therapy here 2 times a week for 4 weeks for range of motion and manual stretching after that.  We will see her back in 6 weeks for clinical recheck and decision for or against repeat injection or manipulation.  Home exercises also stressed.  Follow-Up Instructions: Return in about 6 weeks (around 05/03/2021).   Orders:  Orders Placed This Encounter  Procedures  . Ambulatory referral to Physical Medicine Rehab  . Ambulatory  referral to Physical Therapy   No orders of the defined types were placed in this encounter.     Procedures: No procedures performed   Clinical Data: No additional findings.  Objective: Vital Signs: Ht 5\' 5"  (1.651 m)   Wt 219 lb (99.3 kg)   BMI 36.44 kg/m   Physical Exam:   Constitutional: Patient appears well-developed HEENT:  Head: Normocephalic Eyes:EOM are normal Neck: Normal range of motion Cardiovascular: Normal rate Pulmonary/chest: Effort normal Neurologic: Patient is alert Skin: Skin is warm Psychiatric: Patient has normal mood and affect    Ortho Exam: Ortho exam demonstrates good cervical spine range of motion.  5 out of 5 grip EPL FPL interosseous is flexion is extension bicep tricep deltoid strength bilaterally.  Does have restricted external rotation on the right compared to the left.  Passive shoulder range of motion on the right is 30/70/110.  This is significantly less than the left-hand side.  Rotator cuff strength is intact infraspinatus supraspinatus and subscap muscle testing.  No discrete AC joint tenderness right versus left  Specialty Comments:  No specialty comments available.  Imaging: No results found.   PMFS History: Patient Active Problem List   Diagnosis Date Noted  . Fibromyalgia 12/25/2016  . Other fatigue 12/25/2016  . Primary insomnia 12/25/2016  . Primary osteoarthritis of both knees 12/25/2016  . ANA positive 12/25/2016  . History of gastroesophageal reflux (GERD) 12/25/2016  . History of anemia  12/25/2016  . Trochanteric bursitis of both hips 12/25/2016  . Anemia, iron deficiency 04/18/2015  . Dysphagia, pharyngoesophageal phase 11/14/2011  . Esophageal reflux 11/14/2011   Past Medical History:  Diagnosis Date  . ADD (attention deficit disorder with hyperactivity)   . Allergic rhinitis   . Anxiety   . Arthritis   . Depression   . Dysmenorrhea   . Fibromyalgia   . GERD (gastroesophageal reflux disease)   . H.  pylori infection    per patient   . HLD (hyperlipidemia)   . HTN (hypertension)   . Insomnia   . Irritable bowel syndrome   . Restless leg syndrome   . Sleep apnea     Family History  Problem Relation Age of Onset  . Breast cancer Maternal Grandmother   . Diabetes Sister   . Irritable bowel syndrome Sister   . Heart disease Brother   . Heart attack Brother   . Stroke Mother   . Heart disease Mother   . Diabetes Sister   . Irritable bowel syndrome Sister   . Fibromyalgia Daughter   . Colon cancer Neg Hx   . Colon polyps Neg Hx   . Esophageal cancer Neg Hx   . Kidney disease Neg Hx   . Gallbladder disease Neg Hx   . Stomach cancer Neg Hx     Past Surgical History:  Procedure Laterality Date  . BUNIONECTOMY Left 2008  . DILATION AND CURETTAGE OF UTERUS  2004  . EAR CYST EXCISION Left 1983  . KNEE ARTHROSCOPY Right    with knee cap adjustment  . UPPER GASTROINTESTINAL ENDOSCOPY  06/29/2020   Social History   Occupational History  . Occupation: Disabled  Tobacco Use  . Smoking status: Former Smoker    Packs/day: 0.10    Years: 12.00    Pack years: 1.20    Types: Cigarettes    Quit date: 12/27/1993    Years since quitting: 27.2  . Smokeless tobacco: Never Used  Vaping Use  . Vaping Use: Never used  Substance and Sexual Activity  . Alcohol use: No  . Drug use: No  . Sexual activity: Yes

## 2021-04-07 ENCOUNTER — Other Ambulatory Visit: Payer: Self-pay | Admitting: Nurse Practitioner

## 2021-04-10 ENCOUNTER — Ambulatory Visit: Payer: Self-pay

## 2021-04-10 ENCOUNTER — Ambulatory Visit (INDEPENDENT_AMBULATORY_CARE_PROVIDER_SITE_OTHER): Payer: Medicare Other | Admitting: Physical Medicine and Rehabilitation

## 2021-04-10 ENCOUNTER — Other Ambulatory Visit: Payer: Self-pay

## 2021-04-10 ENCOUNTER — Encounter: Payer: Self-pay | Admitting: Physical Medicine and Rehabilitation

## 2021-04-10 DIAGNOSIS — M25511 Pain in right shoulder: Secondary | ICD-10-CM

## 2021-04-10 DIAGNOSIS — G8929 Other chronic pain: Secondary | ICD-10-CM

## 2021-04-10 MED ORDER — TRIAMCINOLONE ACETONIDE 40 MG/ML IJ SUSP
60.0000 mg | INTRAMUSCULAR | Status: AC | PRN
  Administered 2021-04-10: 60 mg via INTRA_ARTICULAR

## 2021-04-10 MED ORDER — BUPIVACAINE HCL 0.5 % IJ SOLN
3.0000 mL | INTRAMUSCULAR | Status: AC | PRN
  Administered 2021-04-10: 3 mL via INTRA_ARTICULAR

## 2021-04-10 NOTE — Progress Notes (Signed)
   [name] - 50 y.o. female MRN [id number]  Date of birth: 1971-06-19  Office Visit Note: Visit Date: 04/10/2021 PCP: Donald Prose, MD Referred by: Donald Prose, MD  Subjective: Chief Complaint  Patient presents with  . Right Shoulder - Pain   HPI:  [name] is a 50 y.o. female who comes in today at the request of Dr. Anderson Malta for planned Right anesthetic glenohumeral arthrogram with fluoroscopic guidance.  The patient has failed conservative care including home exercise, medications, time and activity modification.  This injection will be diagnostic and hopefully therapeutic.  Please see requesting physician notes for further details and justification.   ROS Otherwise per HPI.  Assessment & Plan: Visit Diagnoses:    ICD-10-CM   1. Chronic right shoulder pain  M25.511 XR C-ARM NO REPORT   G89.29     Plan: No additional findings.   Meds & Orders: No orders of the defined types were placed in this encounter.   Orders Placed This Encounter  Procedures  . Large Joint Inj  . XR C-ARM NO REPORT    Follow-up: Return in about 2 weeks (around 04/24/2021) for  Anderson Malta, MD.   Procedures: Large Joint Inj: R glenohumeral on 04/10/2021 8:44 AM Indications: pain and diagnostic evaluation Details: 22 G 3.5 in needle, fluoroscopy-guided anteromedial approach  Arthrogram: No  Medications: 3 mL bupivacaine 0.5 %; 60 mg triamcinolone acetonide 40 MG/ML Outcome: tolerated well, no immediate complications  There was excellent flow of contrast producing a partial arthrogram of the glenohumeral joint. The patient did have relief of symptoms during the anesthetic phase of the injection. Procedure, treatment alternatives, risks and benefits explained, specific risks discussed. Consent was given by the patient. Immediately prior to procedure a time out was called to verify the correct patient, procedure, equipment, support staff and site/side marked as required.  Patient was prepped and draped in the usual sterile fashion.          Clinical History: No specialty comments available.     Objective:  VS:  HT:    WT:   BMI:     BP:   HR: bpm  TEMP: ( )  RESP:  Physical Exam   Imaging: No results found.

## 2021-04-10 NOTE — Progress Notes (Signed)
Pt state right shoulder pain. Pt state lifting makes the pain worse. Pt state she take pain med and ice to hep ease the pain.  Numeric Pain Rating Scale and Functional Assessment Average Pain 8   In the last MONTH (on 0-10 scale) has pain interfered with the following?  1. General activity like being  able to carry out your everyday physical activities such as walking, climbing stairs, carrying groceries, or moving a chair?  Rating(10)   +Driver, -BT, -Dye Allergies.

## 2021-04-18 ENCOUNTER — Other Ambulatory Visit: Payer: Self-pay | Admitting: Gastroenterology

## 2021-04-18 DIAGNOSIS — R110 Nausea: Secondary | ICD-10-CM

## 2021-04-18 MED ORDER — METOCLOPRAMIDE HCL 5 MG PO TABS: 5.0000 mg | ORAL_TABLET | Freq: Three times a day (TID) | ORAL | 0 refills | Status: DC | PRN

## 2021-04-26 ENCOUNTER — Ambulatory Visit: Payer: Medicare Other | Admitting: Orthopedic Surgery

## 2021-04-26 ENCOUNTER — Ambulatory Visit (INDEPENDENT_AMBULATORY_CARE_PROVIDER_SITE_OTHER): Payer: Medicare Other

## 2021-04-26 DIAGNOSIS — M25511 Pain in right shoulder: Secondary | ICD-10-CM | POA: Diagnosis not present

## 2021-04-26 DIAGNOSIS — M792 Neuralgia and neuritis, unspecified: Secondary | ICD-10-CM | POA: Diagnosis not present

## 2021-04-26 DIAGNOSIS — M79601 Pain in right arm: Secondary | ICD-10-CM

## 2021-04-26 MED ORDER — CYCLOBENZAPRINE HCL 10 MG PO TABS: 10.0000 mg | ORAL_TABLET | Freq: Two times a day (BID) | ORAL | 0 refills | Status: DC | PRN

## 2021-04-27 ENCOUNTER — Encounter: Payer: Self-pay | Admitting: Orthopedic Surgery

## 2021-04-27 ENCOUNTER — Other Ambulatory Visit: Payer: Self-pay | Admitting: Gastroenterology

## 2021-04-27 NOTE — Progress Notes (Signed)
Office Visit Note   Patient: [name]           Date of Birth: [birth year]           MRN: [id number] Visit Date: 04/26/2021 Requested by: Donald Prose, MD [place],  [place] [address] PCP: Donald Prose, MD  Subjective: Chief Complaint  Patient presents with  . Right Shoulder - Follow-up    HPI: [name] is a 50 y.o. female who presents to the office complaining of continued right shoulder pain.  Patient returns following right shoulder injection with Dr. Ernestina Patches on 04/10/2021 that provided no relief from her pain aside from partial relief for 3 hours.  She does have improvement of stiffness in the right shoulder from the injection but no lasting pain relief.  She complains of waking with pain multiple times per night.  She has mild neck pain with right shoulder pain and radiation down the entirety of her right arm.  She has tingling through the entire arm into her second and third fingers of the right hand.  Tingling is in both the dorsal and volar aspects of the fingers.  She has numbness and tingling every day multiple times per day.  No history of neck or shoulder surgery.  No scapular pain.  She also has 6 months of an increasingly unsteady gait where she feels unsteady on her feet.  This has been in the same timeframe as her right shoulder and radicular pain.  Denies any left-sided symptoms.  Taking naproxen for pain control.  Describes the pain as a stabbing/burning sensation..                ROS: All systems reviewed are negative as they relate to the chief complaint within the history of present illness.  Patient denies fevers or chills.  Assessment & Plan: Visit Diagnoses:  1. Radicular pain in right arm   2. Right shoulder pain, unspecified chronicity   3. Right arm pain     Plan: Patient is a 50 year old female who presents complaining of continued right shoulder pain.  She had glenohumeral injection by Dr. Ernestina Patches on 04/10/2021  for right shoulder adhesive capsulitis.  This helped with her range of motion significantly but has not helped with her pain.  Range of motion is improved from 30/70/110 to 70/90/145.  Excellent rotator cuff strength on exam today.  She does have some neck pain that she has noticed as well as radicular pain that travels into the right shoulder and down the entirety of the arm with associated tingling and an increasingly unsteady gait.  Plan to order MRI cervical spine for further evaluation of radicular pain given her failure of improvement with glenohumeral injection.  Radiographs of the cervical spine were taken today and do show mild to moderate degenerative changes throughout the cervical spine without any spondylolisthesis aside from some very slight spondylolisthesis noted at C5-C6.  Follow-up after MRI to review results.  Follow-Up Instructions: No follow-ups on file.   Orders:  Orders Placed This Encounter  Procedures  . XR Cervical Spine 2 or 3 views  . MR Cervical Spine w/o contrast   Meds ordered this encounter  Medications  . cyclobenzaprine (FLEXERIL) 10 MG tablet    Sig: Take 1 tablet (10 mg total) by mouth 2 (two) times daily as needed for muscle spasms.    Dispense:  30 tablet    Refill:  0      Procedures: No procedures  performed   Clinical Data: No additional findings.  Objective: Vital Signs: There were no vitals taken for this visit.  Physical Exam:  Constitutional: Patient appears well-developed HEENT:  Head: Normocephalic Eyes:EOM are normal Neck: Normal range of motion Cardiovascular: Normal rate Pulmonary/chest: Effort normal Neurologic: Patient is alert Skin: Skin is warm Psychiatric: Patient has normal mood and affect  Ortho Exam: Ortho exam demonstrates right shoulder with 75 degrees external rotation, 90 degrees abduction, 145 degrees forward flexion.  Excellent rotator cuff strength with 5/5 motor strength of supraspinatus, infraspinatus,  subscapularis.  No pain with cervical spine range of motion.  5/5 motor strength of bilateral grip strength, finger abduction, pronation/supination, bicep, tricep, deltoid.  No discrete AC joint tenderness.  No bicipital groove tenderness.  Specialty Comments:  No specialty comments available.  Imaging: No results found.   PMFS History: Patient Active Problem List   Diagnosis Date Noted  . Fibromyalgia 12/25/2016  . Other fatigue 12/25/2016  . Primary insomnia 12/25/2016  . Primary osteoarthritis of both knees 12/25/2016  . ANA positive 12/25/2016  . History of gastroesophageal reflux (GERD) 12/25/2016  . History of anemia 12/25/2016  . Trochanteric bursitis of both hips 12/25/2016  . Anemia, iron deficiency 04/18/2015  . Dysphagia, pharyngoesophageal phase 11/14/2011  . Esophageal reflux 11/14/2011   Past Medical History:  Diagnosis Date  . ADD (attention deficit disorder with hyperactivity)   . Allergic rhinitis   . Anxiety   . Arthritis   . Depression   . Dysmenorrhea   . Fibromyalgia   . GERD (gastroesophageal reflux disease)   . H. pylori infection    per patient   . HLD (hyperlipidemia)   . HTN (hypertension)   . Insomnia   . Irritable bowel syndrome   . Restless leg syndrome   . Sleep apnea     Family History  Problem Relation Age of Onset  . Breast cancer Maternal Grandmother   . Diabetes Sister   . Irritable bowel syndrome Sister   . Heart disease Brother   . Heart attack Brother   . Stroke Mother   . Heart disease Mother   . Diabetes Sister   . Irritable bowel syndrome Sister   . Fibromyalgia Daughter   . Colon cancer Neg Hx   . Colon polyps Neg Hx   . Esophageal cancer Neg Hx   . Kidney disease Neg Hx   . Gallbladder disease Neg Hx   . Stomach cancer Neg Hx     Past Surgical History:  Procedure Laterality Date  . BUNIONECTOMY Left 2008  . DILATION AND CURETTAGE OF UTERUS  2004  . EAR CYST EXCISION Left 1983  . KNEE ARTHROSCOPY Right     with knee cap adjustment  . UPPER GASTROINTESTINAL ENDOSCOPY  06/29/2020   Social History   Occupational History  . Occupation: Disabled  Tobacco Use  . Smoking status: Former Smoker    Packs/day: 0.10    Years: 12.00    Pack years: 1.20    Types: Cigarettes    Quit date: 12/27/1993    Years since quitting: 27.3  . Smokeless tobacco: Never Used  Vaping Use  . Vaping Use: Never used  Substance and Sexual Activity  . Alcohol use: No  . Drug use: No  . Sexual activity: Yes

## 2021-04-29 ENCOUNTER — Encounter: Payer: Self-pay | Admitting: Orthopedic Surgery

## 2021-05-05 ENCOUNTER — Other Ambulatory Visit: Payer: Self-pay | Admitting: Gastroenterology

## 2021-05-05 DIAGNOSIS — R110 Nausea: Secondary | ICD-10-CM

## 2021-05-05 MED ORDER — METOCLOPRAMIDE HCL 5 MG PO TABS: 5.0000 mg | ORAL_TABLET | Freq: Three times a day (TID) | ORAL | 2 refills | Status: DC | PRN

## 2021-05-09 ENCOUNTER — Ambulatory Visit
Admission: RE | Admit: 2021-05-09 | Discharge: 2021-05-09 | Disposition: A | Discharge status: Home / Self Care | Payer: Medicare Other | Source: Ambulatory Visit | Attending: Orthopedic Surgery | Admitting: Orthopedic Surgery

## 2021-05-09 ENCOUNTER — Encounter: Payer: Self-pay | Admitting: Pulmonary Disease

## 2021-05-09 ENCOUNTER — Ambulatory Visit (INDEPENDENT_AMBULATORY_CARE_PROVIDER_SITE_OTHER): Payer: Medicare Other | Admitting: Pulmonary Disease

## 2021-05-09 ENCOUNTER — Other Ambulatory Visit: Payer: Self-pay

## 2021-05-09 VITALS — BP 122/86 | HR 99 | Temp 97.8°F | Ht 65.0 in | Wt 220.0 lb

## 2021-05-09 DIAGNOSIS — J3489 Other specified disorders of nose and nasal sinuses: Secondary | ICD-10-CM | POA: Diagnosis not present

## 2021-05-09 DIAGNOSIS — R059 Cough, unspecified: Secondary | ICD-10-CM | POA: Diagnosis not present

## 2021-05-09 DIAGNOSIS — M79601 Pain in right arm: Secondary | ICD-10-CM

## 2021-05-09 DIAGNOSIS — R042 Hemoptysis: Secondary | ICD-10-CM | POA: Diagnosis not present

## 2021-05-09 MED ORDER — MUPIROCIN CALCIUM 2 % EX CREA: 1.0000 "application " | TOPICAL_CREAM | Freq: Two times a day (BID) | CUTANEOUS | 0 refills | Status: DC

## 2021-05-09 NOTE — Patient Instructions (Signed)
We will check a CT Chest for your cough  Start using mupirocin ointment for your nasal sore twice daily for 14 days.

## 2021-05-09 NOTE — Progress Notes (Signed)
Synopsis: Referred in May 2022 for chronic cough by Wray Kearns, MD  Subjective:   PATIENT ID: [name] GENDER: female DOB: 07-06-1971, MRN: [id number]   HPI  Chief Complaint  Patient presents with  . Consult    Referred by GI for a productive cough that she has had since March 2021. States her phlegm has been clear up until May 12th. She has been noticing small amounts of blood in her phlegm. She has a sensation in her throat that feels like a needle poking her while coughing.     Noreen Comer-Stevenson is a 50 year old woman, former smoker with history of GERD, dysphagia, and fibromyalgia who was referred to pulmonary clinic for evaluation of cough.  She reports having cough since last March.  She has been coughing up clear mucus until recently she has noted blood-tinged sputum.  She describes coughing episodes with posttussive emesis.  She has tried using albuterol and cough syrup without relief of her cough.  She does report shortness of breath and chest tightness with the cough.  She denies wheezing.  She is followed by GI for GERD and ongoing symptoms of nausea and vomiting.  She does report history of dysphagia requiring esophageal dilation.  She had EGD last year with biopsies showing H. pylori positivity in which she has completed treatment.  She is currently taking omeprazole 20 mg twice daily.  She also sleeps with the head of her bed elevated at night.  She denies any epistaxis but she reports a sore in her left nasal passage that can lead to blood-tinged nasal secretions and irritation.   She quit smoking in 1995 and has about a 12 pack year smoking history.  She denies history of blood clots.  Past Medical History:  Diagnosis Date  . ADD (attention deficit disorder with hyperactivity)   . Allergic rhinitis   . Anxiety   . Arthritis   . Depression   . Dysmenorrhea   . Fibromyalgia   . GERD (gastroesophageal reflux disease)   . H. pylori infection     per patient   . HLD (hyperlipidemia)   . HTN (hypertension)   . Insomnia   . Irritable bowel syndrome   . Restless leg syndrome   . Sleep apnea      Family History  Problem Relation Age of Onset  . Breast cancer Maternal Grandmother   . Diabetes Sister   . Irritable bowel syndrome Sister   . Heart disease Brother   . Heart attack Brother   . Stroke Mother   . Heart disease Mother   . Diabetes Sister   . Irritable bowel syndrome Sister   . Fibromyalgia Daughter   . Colon cancer Neg Hx   . Colon polyps Neg Hx   . Esophageal cancer Neg Hx   . Kidney disease Neg Hx   . Gallbladder disease Neg Hx   . Stomach cancer Neg Hx      Social History   Socioeconomic History  . Marital status: Widowed    Spouse name: Not on file  . Number of children: 1  . Years of education: Not on file  . Highest education level: Not on file  Occupational History  . Occupation: Disabled  Tobacco Use  . Smoking status: Former Smoker    Packs/day: 0.10    Years: 12.00    Pack years: 1.20    Types: Cigarettes    Quit date: 12/27/1993    Years since quitting:  27.3  . Smokeless tobacco: Never Used  Vaping Use  . Vaping Use: Never used  Substance and Sexual Activity  . Alcohol use: No  . Drug use: No  . Sexual activity: Yes  Other Topics Concern  . Not on file  Social History Narrative  . Not on file   Social Determinants of Health   Financial Resource Strain: Not on file  Food Insecurity: Not on file  Transportation Needs: Not on file  Physical Activity: Not on file  Stress: Not on file  Social Connections: Not on file  Intimate Partner Violence: Not on file     No Known Allergies   Outpatient Medications Prior to Visit  Medication Sig Dispense Refill  . albuterol (VENTOLIN HFA) 108 (90 Base) MCG/ACT inhaler Inhale 2 puffs into the lungs in the morning, at noon, in the evening, and at bedtime.    Marland Kitchen amLODipine (NORVASC) 5 MG tablet Take 5 mg by mouth daily.    . cyclobenzaprine  (FLEXERIL) 10 MG tablet Take 1 tablet (10 mg total) by mouth 2 (two) times daily as needed for muscle spasms. 30 tablet 0  . diclofenac Sodium (VOLTAREN) 1 % GEL Apply 2-4 grams to affected joint 4 times daily as needed. 400 g 2  . escitalopram (LEXAPRO) 20 MG tablet Take 20 mg by mouth at bedtime.     . gabapentin (NEURONTIN) 300 MG capsule Take 600 mg by mouth at bedtime.     Marland Kitchen lisinopril (ZESTRIL) 20 MG tablet Take 20 mg by mouth daily.    . metFORMIN (GLUCOPHAGE-XR) 500 MG 24 hr tablet Take 500 mg by mouth every evening.     . metoCLOPramide (REGLAN) 5 MG tablet Take 1 tablet (5 mg total) by mouth every 8 (eight) hours as needed for nausea. 60 tablet 2  . omeprazole (PRILOSEC) 20 MG capsule TAKE 2 CAPSULES BY MOUTH EVERY DAY 180 capsule 3  . ONETOUCH VERIO test strip 1 each daily.    Marland Kitchen OZEMPIC, 0.25 OR 0.5 MG/DOSE, 2 MG/1.5ML SOPN Inject 0.25 mg into the skin at bedtime.     Marland Kitchen PRAVASTATIN SODIUM PO Take 1 tablet by mouth daily.     . traMADol (ULTRAM) 50 MG tablet TAKE 2 TABLETS BY MOUTH EVERY DAY AT BEDTIME AS NEEDED FOR PAIN 60 tablet 0  . zolpidem (AMBIEN) 5 MG tablet TAKE 1 TABLET (5 MG TOTAL) BY MOUTH AT BEDTIME AS NEEDED FOR SLEEP. 30 tablet 0  . ondansetron (ZOFRAN) 4 MG tablet TAKE 1 TABLET (4 MG TOTAL) BY MOUTH EVERY 6 (SIX) HOURS AS NEEDED FOR NAUSEA OR VOMITING 20 tablet 1   No facility-administered medications prior to visit.    Review of Systems  Constitutional: Negative for chills, fever, malaise/fatigue and weight loss.  HENT: Negative for congestion, sinus pain and sore throat.   Eyes: Negative.   Respiratory: Positive for cough, hemoptysis, sputum production and shortness of breath. Negative for wheezing.   Cardiovascular: Negative for chest pain, palpitations, orthopnea, claudication and leg swelling.  Gastrointestinal: Positive for heartburn and nausea. Negative for abdominal pain and vomiting.  Genitourinary: Negative.   Musculoskeletal: Negative for joint pain and  myalgias.  Skin: Negative for rash.  Neurological: Negative for weakness.  Endo/Heme/Allergies: Negative.   Psychiatric/Behavioral: Negative.       Objective:   Vitals:   05/09/21 1600  BP: 122/86  Pulse: 99  Temp: 97.8 F (36.6 C)  TempSrc: Temporal  SpO2: 100%  Weight: 220 lb (99.8 kg)  Height: 5'  5" (1.651 m)     Physical Exam Constitutional:      General: She is not in acute distress.    Appearance: She is not ill-appearing.  HENT:     Head: Normocephalic and atraumatic.     Nose:     Comments: erythemia of left nasal passage Eyes:     General: No scleral icterus.    Conjunctiva/sclera: Conjunctivae normal.     Pupils: Pupils are equal, round, and reactive to light.  Cardiovascular:     Rate and Rhythm: Normal rate and regular rhythm.     Pulses: Normal pulses.     Heart sounds: Normal heart sounds. No murmur heard.   Pulmonary:     Effort: Pulmonary effort is normal.     Breath sounds: Normal breath sounds. No wheezing, rhonchi or rales.  Abdominal:     General: Bowel sounds are normal.     Palpations: Abdomen is soft.  Musculoskeletal:     Right lower leg: No edema.     Left lower leg: No edema.  Lymphadenopathy:     Cervical: No cervical adenopathy.  Skin:    General: Skin is warm and dry.  Neurological:     General: No focal deficit present.     Mental Status: She is alert.  Psychiatric:        Mood and Affect: Mood normal.        Behavior: Behavior normal.        Thought Content: Thought content normal.        Judgment: Judgment normal.     CBC    Component Value Date/Time   WBC 10.1 06/29/2020 1155   RBC 5.17 (H) 06/29/2020 1155   HGB 12.7 06/29/2020 1155   HCT 39.4 06/29/2020 1155   PLT 409.0 (H) 06/29/2020 1155   MCV 76.2 (L) 06/29/2020 1155   MCH 22.7 (L) 12/28/2019 1617   MCHC 32.3 06/29/2020 1155   RDW 17.6 (H) 06/29/2020 1155   LYMPHSABS 4.3 (H) 06/29/2020 1155   MONOABS 0.6 06/29/2020 1155   EOSABS 0.2 06/29/2020 1155    BASOSABS 0.1 06/29/2020 1155   BMP Latest Ref Rng & Units 06/29/2020 12/28/2019 12/28/2019  Glucose 70 - 99 mg/dL 191(H) 519(HH) 593(HH)  BUN 6 - 23 mg/dL 8 9 11   Creatinine 0.40 - 1.20 mg/dL 0.78 0.60 0.89  Sodium 135 - 145 mEq/L 137 137 132(L)  Potassium 3.5 - 5.1 mEq/L 3.3(L) 3.9 3.7  Chloride 96 - 112 mEq/L 101 100 97(L)  CO2 19 - 32 mEq/L 28 - 24  Calcium 8.4 - 10.5 mg/dL 9.2 - 9.0   Chest imaging: CXR 12/28/2019 The heart size and mediastinal contours are within normal limits. Both lungs are clear. Mild thoracic spondylosis. No blunting of the costophrenic angles.  PFT: No flowsheet data found.   Assessment & Plan:   Cough with hemoptysis  Nasal sore - Plan: mupirocin cream (BACTROBAN) 2 %  Cough - Plan: CT Chest Wo Contrast  Discussion: Debora Comer-Stevenson is a 50 year old woman, former smoker with history of GERD, dysphagia, and fibromyalgia who was referred to pulmonary clinic for evaluation of cough.  Her cough is likely secondary to her significant GERD along with concern for postnasal drainage.  The postnasal drainage could be the source of the blood-tinged sputum given the ulcerated appearing area of her left nasal passage.  She is to use bacitracin ointment twice daily for 2 weeks in both nasal passages for treatment of the ulcerated area.  We will also obtain a CT chest to further evaluate the concern for hemoptysis.  She did not benefit from albuterol inhaler therapy previously so we will hold off on any inhaler trials at this time.  She is to follow-up in 1 month.  Freda Jackson, MD [facility] Office: [phone]    Current Outpatient Medications:  .  albuterol (VENTOLIN HFA) 108 (90 Base) MCG/ACT inhaler, Inhale 2 puffs into the lungs in the morning, at noon, in the evening, and at bedtime., Disp: , Rfl:  .  amLODipine (NORVASC) 5 MG tablet, Take 5 mg by mouth daily., Disp: , Rfl:  .  cyclobenzaprine (FLEXERIL) 10 MG tablet,  Take 1 tablet (10 mg total) by mouth 2 (two) times daily as needed for muscle spasms., Disp: 30 tablet, Rfl: 0 .  diclofenac Sodium (VOLTAREN) 1 % GEL, Apply 2-4 grams to affected joint 4 times daily as needed., Disp: 400 g, Rfl: 2 .  escitalopram (LEXAPRO) 20 MG tablet, Take 20 mg by mouth at bedtime. , Disp: , Rfl:  .  gabapentin (NEURONTIN) 300 MG capsule, Take 600 mg by mouth at bedtime. , Disp: , Rfl:  .  lisinopril (ZESTRIL) 20 MG tablet, Take 20 mg by mouth daily., Disp: , Rfl:  .  metFORMIN (GLUCOPHAGE-XR) 500 MG 24 hr tablet, Take 500 mg by mouth every evening. , Disp: , Rfl:  .  metoCLOPramide (REGLAN) 5 MG tablet, Take 1 tablet (5 mg total) by mouth every 8 (eight) hours as needed for nausea., Disp: 60 tablet, Rfl: 2 .  mupirocin cream (BACTROBAN) 2 %, Apply 1 application topically 2 (two) times daily., Disp: 30 g, Rfl: 0 .  omeprazole (PRILOSEC) 20 MG capsule, TAKE 2 CAPSULES BY MOUTH EVERY DAY, Disp: 180 capsule, Rfl: 3 .  ONETOUCH VERIO test strip, 1 each daily., Disp: , Rfl:  .  OZEMPIC, 0.25 OR 0.5 MG/DOSE, 2 MG/1.5ML SOPN, Inject 0.25 mg into the skin at bedtime. , Disp: , Rfl:  .  PRAVASTATIN SODIUM PO, Take 1 tablet by mouth daily. , Disp: , Rfl:  .  traMADol (ULTRAM) 50 MG tablet, TAKE 2 TABLETS BY MOUTH EVERY DAY AT BEDTIME AS NEEDED FOR PAIN, Disp: 60 tablet, Rfl: 0 .  zolpidem (AMBIEN) 5 MG tablet, TAKE 1 TABLET (5 MG TOTAL) BY MOUTH AT BEDTIME AS NEEDED FOR SLEEP., Disp: 30 tablet, Rfl: 0

## 2021-05-10 ENCOUNTER — Encounter: Payer: Self-pay | Admitting: Pulmonary Disease

## 2021-05-11 ENCOUNTER — Other Ambulatory Visit: Payer: Self-pay | Admitting: Orthopedic Surgery

## 2021-05-11 NOTE — Telephone Encounter (Signed)
Please advise 

## 2021-05-12 ENCOUNTER — Other Ambulatory Visit: Payer: Medicare Other

## 2021-05-17 ENCOUNTER — Ambulatory Visit (INDEPENDENT_AMBULATORY_CARE_PROVIDER_SITE_OTHER)
Admission: RE | Admit: 2021-05-17 | Discharge: 2021-05-17 | Disposition: A | Discharge status: Home / Self Care | Payer: Medicare Other | Source: Ambulatory Visit | Attending: Pulmonary Disease | Admitting: Pulmonary Disease

## 2021-05-17 ENCOUNTER — Other Ambulatory Visit: Payer: Self-pay

## 2021-05-17 DIAGNOSIS — R059 Cough, unspecified: Secondary | ICD-10-CM

## 2021-05-18 ENCOUNTER — Ambulatory Visit: Payer: Medicare Other | Admitting: Orthopedic Surgery

## 2021-05-18 ENCOUNTER — Encounter: Payer: Self-pay | Admitting: Orthopedic Surgery

## 2021-05-18 DIAGNOSIS — M19011 Primary osteoarthritis, right shoulder: Secondary | ICD-10-CM

## 2021-05-18 NOTE — Progress Notes (Signed)
Office Visit Note   Patient: [name]           Date of Birth: February 16, 1971           MRN: [id number] Visit Date: 05/18/2021 Requested by: Donald Prose, MD [place],  [place] [address] PCP: Donald Prose, MD  Subjective: Chief Complaint  Patient presents with  . Other     Scan review    HPI: [name] is a patient here to review shoulder MRI and cervical spine MRI.  She has some left-sided foraminal stenosis but no real left-sided shoulder or arm symptoms.  Cervical MRI scan images are reviewed with the patient.  Prior MRI scan showed no rotator cuff tear and some tendinitis.  She is having a lot of pain in the superior aspect of the right shoulder.  Denies much in the way of numbness and tingling extending into the hand but she does have some occasional symptoms radiating below the elbow on the right-hand side.  Naproxen helps.  She is disabled.  She has Voltaren gel at home and wants to try that on her shoulder.              ROS: All systems reviewed are negative as they relate to the chief complaint within the history of present illness.  Patient denies  fevers or chills.   Assessment & Plan: Visit Diagnoses:  1. Arthritis of right acromioclavicular joint     Plan: Impression is good movement and motion in the right shoulder joint following glenohumeral joint injection 6 weeks ago.  Now is having AC joint symptoms on the right-hand side.  Cervical spine MRI demonstrated no right-sided radicular localizing pathology.  Rotator cuff strength remains good today.  Rena try topical anti-inflammatory on the [facility] joint for 2 weeks if that does not help come back in 4 ultrasound-guided injection.  Follow-Up Instructions: Return if symptoms worsen or fail to improve.   Orders:  No orders of the defined types were placed in this encounter.  No orders of the defined types were placed in this encounter.     Procedures: No procedures performed   Clinical  Data: No additional findings.  Objective: Vital Signs: There were no vitals taken for this visit.  Physical Exam:   Constitutional: Patient appears well-developed HEENT:  Head: Normocephalic Eyes:EOM are normal Neck: Normal range of motion Cardiovascular: Normal rate Pulmonary/chest: Effort normal Neurologic: Patient is alert Skin: Skin is warm Psychiatric: Patient has normal mood and affect    Ortho Exam: Ortho exam demonstrates external rotation shoulder range of motion on the right 65/100/170.  Rotator cuff strength on the right 5+ out of 5 infraspinatus supraspinatus subscap.  Does have AC joint tenderness on the right compared to the left.  No crepitus with passive or active range of motion.  Cervical spine range of motion is intact with flexion extension and rotation to about 60 degrees bilaterally.  Does have pain with crossarm abduction on the right-hand side  Specialty Comments:  No specialty comments available.  Imaging: CT Chest High Resolution  Result Date: 05/18/2021 CLINICAL DATA:  Persistent cough cough, blood-tinged sputum EXAM: CT CHEST WITHOUT CONTRAST TECHNIQUE: Multidetector CT imaging of the chest was performed following the standard protocol without intravenous contrast. High resolution imaging of the lungs, as well as inspiratory and expiratory imaging, was performed. COMPARISON:  None. FINDINGS: Cardiovascular: No significant vascular findings. Normal heart size. No pericardial effusion. Mediastinum/Nodes: No enlarged mediastinal, hilar, or axillary lymph  nodes. Thyroid gland, trachea, and esophagus demonstrate no significant findings. Lungs/Pleura: 4 mm subpleural nodule of the anterior left upper lobe (series 4, image 87). No evidence of fibrotic interstitial lung disease. No significant air trapping on expiratory phase imaging. No pleural effusion or pneumothorax. Upper Abdomen: No acute abnormality. Musculoskeletal: No chest wall mass or suspicious bone lesions  identified. IMPRESSION: 1. No evidence of fibrotic interstitial lung disease. No significant air trapping on expiratory phase imaging. 2. Incidental 4 mm subpleural nodule of the anterior left upper lobe. No follow-up needed if patient is low-risk. Non-contrast chest CT can be considered in 12 months if patient is high-risk. This recommendation follows the consensus statement: Guidelines for Management of Incidental Pulmonary Nodules Detected on CT Images: From the Fleischner Society 2017; Radiology 2017; 284:228-243. Electronically Signed   By: Eddie Candle M.D.   On: 05/18/2021 10:00     PMFS History: Patient Active Problem List   Diagnosis Date Noted  . Fibromyalgia 12/25/2016  . Other fatigue 12/25/2016  . Primary insomnia 12/25/2016  . Primary osteoarthritis of both knees 12/25/2016  . ANA positive 12/25/2016  . History of gastroesophageal reflux (GERD) 12/25/2016  . History of anemia 12/25/2016  . Trochanteric bursitis of both hips 12/25/2016  . Anemia, iron deficiency 04/18/2015  . Dysphagia, pharyngoesophageal phase 11/14/2011  . Esophageal reflux 11/14/2011   Past Medical History:  Diagnosis Date  . ADD (attention deficit disorder with hyperactivity)   . Allergic rhinitis   . Anxiety   . Arthritis   . Depression   . Dysmenorrhea   . Fibromyalgia   . GERD (gastroesophageal reflux disease)   . H. pylori infection    per patient   . HLD (hyperlipidemia)   . HTN (hypertension)   . Insomnia   . Irritable bowel syndrome   . Restless leg syndrome   . Sleep apnea     Family History  Problem Relation Age of Onset  . Breast cancer Maternal Grandmother   . Diabetes Sister   . Irritable bowel syndrome Sister   . Heart disease Brother   . Heart attack Brother   . Stroke Mother   . Heart disease Mother   . Diabetes Sister   . Irritable bowel syndrome Sister   . Fibromyalgia Daughter   . Colon cancer Neg Hx   . Colon polyps Neg Hx   . Esophageal cancer Neg Hx   . Kidney  disease Neg Hx   . Gallbladder disease Neg Hx   . Stomach cancer Neg Hx     Past Surgical History:  Procedure Laterality Date  . BUNIONECTOMY Left 2008  . DILATION AND CURETTAGE OF UTERUS  2004  . EAR CYST EXCISION Left 1983  . KNEE ARTHROSCOPY Right    with knee cap adjustment  . UPPER GASTROINTESTINAL ENDOSCOPY  06/29/2020   Social History   Occupational History  . Occupation: Disabled  Tobacco Use  . Smoking status: Former Smoker    Packs/day: 0.10    Years: 12.00    Pack years: 1.20    Types: Cigarettes    Quit date: 12/27/1993    Years since quitting: 27.4  . Smokeless tobacco: Never Used  Vaping Use  . Vaping Use: Never used  Substance and Sexual Activity  . Alcohol use: No  . Drug use: No  . Sexual activity: Yes

## 2021-05-23 ENCOUNTER — Other Ambulatory Visit: Payer: Self-pay | Admitting: Physician Assistant

## 2021-05-23 NOTE — Telephone Encounter (Signed)
Last Visit: 12/30/2020 Next Visit: 06/30/2021  Current Dose per office note on 12/30/2020: not discussed.   Okay to refill per Dr. Estanislado Pandy

## 2021-05-28 ENCOUNTER — Other Ambulatory Visit: Payer: Self-pay | Admitting: Surgical

## 2021-06-12 ENCOUNTER — Other Ambulatory Visit: Payer: Self-pay

## 2021-06-12 ENCOUNTER — Ambulatory Visit: Payer: Medicare Other | Admitting: Pulmonary Disease

## 2021-06-12 ENCOUNTER — Encounter: Payer: Self-pay | Admitting: Pulmonary Disease

## 2021-06-12 VITALS — BP 128/82 | HR 115 | Temp 98.2°F | Resp 18 | Ht 65.0 in | Wt 222.4 lb

## 2021-06-12 DIAGNOSIS — R059 Cough, unspecified: Secondary | ICD-10-CM

## 2021-06-12 DIAGNOSIS — R0982 Postnasal drip: Secondary | ICD-10-CM | POA: Diagnosis not present

## 2021-06-12 DIAGNOSIS — K219 Gastro-esophageal reflux disease without esophagitis: Secondary | ICD-10-CM | POA: Diagnosis not present

## 2021-06-12 MED ORDER — IPRATROPIUM BROMIDE 0.03 % NA SOLN: 2.0000 | Freq: Two times a day (BID) | NASAL | 12 refills | Status: DC

## 2021-06-12 MED ORDER — HYDROCODONE BIT-HOMATROP MBR 5-1.5 MG/5ML PO SOLN: 5.0000 mL | Freq: Four times a day (QID) | ORAL | 0 refills | Status: DC | PRN

## 2021-06-12 MED ORDER — FLUTICASONE PROPIONATE 50 MCG/ACT NA SUSP: 1.0000 | Freq: Every day | NASAL | 2 refills | Status: DC

## 2021-06-12 NOTE — Patient Instructions (Addendum)
Start fluticasone nasal spray, 2 sprays per nostril per day  Start ipratropium nasal spray, 2 sprays per nostril twice daily.   Use hydromet cough syrup at night to reduce cough symptoms and improve sleep.

## 2021-06-12 NOTE — Progress Notes (Signed)
Synopsis: Referred in May 2022 for chronic cough by Wray Kearns, MD  Subjective:   PATIENT ID: [name] GENDER: female DOB: [birth year], MRN: [id number]  HPI  Chief Complaint  Patient presents with   Follow-up    Still coughs up blood a little    [name] is a 50 year old woman, former smoker with history of GERD, dysphagia, and fibromyalgia returns to pulmonary clinic for chronic cough.  She reports she is still coughing up blood-tinged sputum.  She is also blowing her nose with bloody secretions.  She has noted that her sinuses have been draining a lot these past few weeks.  She continues to deny any shortness of breath or wheezing with the cough.  HRCT chest on 05/17/2021 shows unremarkable parenchyma and airways.  No nodules or effusions noted.  OV 05/09/21 She reports having cough since last March.  She has been coughing up clear mucus until recently she has noted blood-tinged sputum.  She describes coughing episodes with posttussive emesis.  She has tried using albuterol and cough syrup without relief of her cough.  She does report shortness of breath and chest tightness with the cough.  She denies wheezing.  She is followed by GI for GERD and ongoing symptoms of nausea and vomiting.  She does report history of dysphagia requiring esophageal dilation.  She had EGD last year with biopsies showing H. pylori positivity in which she has completed treatment.  She is currently taking omeprazole 20 mg twice daily.  She also sleeps with the head of her bed elevated at night.  She denies any epistaxis but she reports a sore in her left nasal passage that can lead to blood-tinged nasal secretions and irritation.   She quit smoking in 1995 and has about a 12 pack year smoking history.  She denies history of blood clots.  Past Medical History:  Diagnosis Date   ADD (attention deficit disorder with hyperactivity)    Allergic rhinitis    Anxiety    Arthritis     Depression    Dysmenorrhea    Fibromyalgia    GERD (gastroesophageal reflux disease)    H. pylori infection    per patient    HLD (hyperlipidemia)    HTN (hypertension)    Insomnia    Irritable bowel syndrome    Restless leg syndrome    Sleep apnea      Family History  Problem Relation Age of Onset   Breast cancer Maternal Grandmother    Diabetes Sister    Irritable bowel syndrome Sister    Heart disease Brother    Heart attack Brother    Stroke Mother    Heart disease Mother    Diabetes Sister    Irritable bowel syndrome Sister    Fibromyalgia Daughter    Colon cancer Neg Hx    Colon polyps Neg Hx    Esophageal cancer Neg Hx    Kidney disease Neg Hx    Gallbladder disease Neg Hx    Stomach cancer Neg Hx      Social History   Socioeconomic History   Marital status: Widowed    Spouse name: Not on file   Number of children: 1   Years of education: Not on file   Highest education level: Not on file  Occupational History   Occupation: Disabled  Tobacco Use   Smoking status: Former    Packs/day: 0.10    Years: 12.00    Pack years: 1.20  Types: Cigarettes    Quit date: 12/27/1993    Years since quitting: 27.4   Smokeless tobacco: Never  Vaping Use   Vaping Use: Never used  Substance and Sexual Activity   Alcohol use: No   Drug use: No   Sexual activity: Yes  Other Topics Concern   Not on file  Social History Narrative   Not on file   Social Determinants of Health   Financial Resource Strain: Not on file  Food Insecurity: Not on file  Transportation Needs: Not on file  Physical Activity: Not on file  Stress: Not on file  Social Connections: Not on file  Intimate Partner Violence: Not on file     No Known Allergies   Outpatient Medications Prior to Visit  Medication Sig Dispense Refill   albuterol (VENTOLIN HFA) 108 (90 Base) MCG/ACT inhaler Inhale 2 puffs into the lungs in the morning, at noon, in the evening, and at bedtime.     amLODipine  (NORVASC) 5 MG tablet Take 5 mg by mouth daily.     diclofenac Sodium (VOLTAREN) 1 % GEL APPLY 2-4 GRAMS TO AFFECTED JOINT 4 TIMES DAILY AS NEEDED. 400 g 2   escitalopram (LEXAPRO) 20 MG tablet Take 20 mg by mouth at bedtime.      gabapentin (NEURONTIN) 300 MG capsule Take 600 mg by mouth at bedtime.      lisinopril (ZESTRIL) 20 MG tablet Take 20 mg by mouth daily.     metFORMIN (GLUCOPHAGE-XR) 500 MG 24 hr tablet Take 500 mg by mouth every evening.      metoCLOPramide (REGLAN) 5 MG tablet Take 1 tablet (5 mg total) by mouth every 8 (eight) hours as needed for nausea. 60 tablet 2   mupirocin cream (BACTROBAN) 2 % Apply 1 application topically 2 (two) times daily. 30 g 0   omeprazole (PRILOSEC) 20 MG capsule TAKE 2 CAPSULES BY MOUTH EVERY DAY 180 capsule 3   ONETOUCH VERIO test strip 1 each daily.     OZEMPIC, 0.25 OR 0.5 MG/DOSE, 2 MG/1.5ML SOPN Inject 0.25 mg into the skin at bedtime.      PRAVASTATIN SODIUM PO Take 1 tablet by mouth daily.      traMADol (ULTRAM) 50 MG tablet TAKE 2 TABLETS BY MOUTH EVERY DAY AT BEDTIME AS NEEDED FOR PAIN 60 tablet 0   zolpidem (AMBIEN) 5 MG tablet TAKE 1 TABLET (5 MG TOTAL) BY MOUTH AT BEDTIME AS NEEDED FOR SLEEP. 30 tablet 0   cyclobenzaprine (FLEXERIL) 10 MG tablet TAKE 1 TABLET BY MOUTH TWICE A DAY AS NEEDED FOR MUSCLE SPASM 30 tablet 0   No facility-administered medications prior to visit.    Review of Systems  Constitutional:  Negative for chills, fever, malaise/fatigue and weight loss.  HENT:  Positive for congestion and nosebleeds. Negative for sinus pain and sore throat.   Eyes: Negative.   Respiratory:  Positive for cough, hemoptysis, sputum production and shortness of breath. Negative for wheezing.   Cardiovascular:  Negative for chest pain, palpitations, orthopnea, claudication and leg swelling.  Gastrointestinal:  Positive for heartburn. Negative for abdominal pain, nausea and vomiting.  Genitourinary: Negative.   Musculoskeletal:  Negative  for joint pain and myalgias.  Skin:  Negative for rash.  Neurological:  Negative for weakness.  Endo/Heme/Allergies: Negative.   Psychiatric/Behavioral: Negative.       Objective:   Vitals:   06/12/21 0956  BP: 128/82  Pulse: (!) 115  Resp: 18  Temp: 98.2 F (36.8 C)  TempSrc: Oral  SpO2: 100%  Weight: 222 lb 6.4 oz (100.9 kg)  Height: 5\' 5"  (1.651 m)     Physical Exam Constitutional:      General: She is not in acute distress.    Appearance: She is not ill-appearing.  HENT:     Head: Normocephalic and atraumatic.     Nose:     Comments: Bilateral erythema of nasal passages Eyes:     General: No scleral icterus.    Conjunctiva/sclera: Conjunctivae normal.     Pupils: Pupils are equal, round, and reactive to light.  Cardiovascular:     Rate and Rhythm: Normal rate and regular rhythm.     Pulses: Normal pulses.     Heart sounds: Normal heart sounds. No murmur heard. Pulmonary:     Effort: Pulmonary effort is normal.     Breath sounds: Normal breath sounds. No wheezing, rhonchi or rales.  Abdominal:     General: Bowel sounds are normal.     Palpations: Abdomen is soft.  Musculoskeletal:     Right lower leg: No edema.     Left lower leg: No edema.  Skin:    General: Skin is warm and dry.  Neurological:     General: No focal deficit present.     Mental Status: She is alert.  Psychiatric:        Mood and Affect: Mood normal.        Behavior: Behavior normal.        Thought Content: Thought content normal.        Judgment: Judgment normal.    CBC    Component Value Date/Time   WBC 10.1 06/29/2020 1155   RBC 5.17 (H) 06/29/2020 1155   HGB 12.7 06/29/2020 1155   HCT 39.4 06/29/2020 1155   PLT 409.0 (H) 06/29/2020 1155   MCV 76.2 (L) 06/29/2020 1155   MCH 22.7 (L) 12/28/2019 1617   MCHC 32.3 06/29/2020 1155   RDW 17.6 (H) 06/29/2020 1155   LYMPHSABS 4.3 (H) 06/29/2020 1155   MONOABS 0.6 06/29/2020 1155   EOSABS 0.2 06/29/2020 1155   BASOSABS 0.1  06/29/2020 1155   BMP Latest Ref Rng & Units 06/29/2020 12/28/2019 12/28/2019  Glucose 70 - 99 mg/dL 191(H) 519(HH) 593(HH)  BUN 6 - 23 mg/dL 8 9 11   Creatinine 0.40 - 1.20 mg/dL 0.78 0.60 0.89  Sodium 135 - 145 mEq/L 137 137 132(L)  Potassium 3.5 - 5.1 mEq/L 3.3(L) 3.9 3.7  Chloride 96 - 112 mEq/L 101 100 97(L)  CO2 19 - 32 mEq/L 28 - 24  Calcium 8.4 - 10.5 mg/dL 9.2 - 9.0   Chest imaging: CT Chest 05/17/21 Mediastinum/Nodes: No enlarged mediastinal, hilar, or axillary lymph nodes. Thyroid gland, trachea, and esophagus demonstrate no significant findings.   Lungs/Pleura: 4 mm subpleural nodule of the anterior left upper lobe (series 4, image 87). No evidence of fibrotic interstitial lung disease. No significant air trapping on expiratory phase imaging. No pleural effusion or pneumothorax.  CXR 12/28/2019 The heart size and mediastinal contours are within normal limits. Both lungs are clear. Mild thoracic spondylosis. No blunting of the costophrenic angles.  PFT: No flowsheet data found.   Assessment & Plan:   Cough - Plan: HYDROcodone bit-homatropine (HYDROMET) 5-1.5 MG/5ML syrup  Post-nasal drainage - Plan: fluticasone (FLONASE) 50 MCG/ACT nasal spray, ipratropium (ATROVENT) 0.03 % nasal spray  Gastroesophageal reflux disease without esophagitis  Discussion: [name] is a 50 year old woman, former smoker with history of GERD, dysphagia,  and fibromyalgia who was referred to pulmonary clinic for evaluation of cough.   Her cough is likely secondary to her significant GERD along with concern for postnasal drainage.  The postnasal drainage could be the source of the blood-tinged sputum.  Now that the ulcerated lesions in her nasal passages have been treated with bacitracin ointment for 2-week.  We will start her on fluticasone nasal spray and ipratropium nasal spray.  Her CT chest scan is reassuring that there are no concerning lesions of her airways or parenchyma  leading to the blood-tinged sputum.  We will provide her with Hydromet cough syrup for symptom relief and to help her sleep at night.  She is to follow-up in 3 months.  Freda Jackson, MD [facility] Office: [phone]    Current Outpatient Medications:    albuterol (VENTOLIN HFA) 108 (90 Base) MCG/ACT inhaler, Inhale 2 puffs into the lungs in the morning, at noon, in the evening, and at bedtime., Disp: , Rfl:    amLODipine (NORVASC) 5 MG tablet, Take 5 mg by mouth daily., Disp: , Rfl:    diclofenac Sodium (VOLTAREN) 1 % GEL, APPLY 2-4 GRAMS TO AFFECTED JOINT 4 TIMES DAILY AS NEEDED., Disp: 400 g, Rfl: 2   escitalopram (LEXAPRO) 20 MG tablet, Take 20 mg by mouth at bedtime. , Disp: , Rfl:    fluticasone (FLONASE) 50 MCG/ACT nasal spray, Place 1 spray into both nostrils daily., Disp: 16 g, Rfl: 2   gabapentin (NEURONTIN) 300 MG capsule, Take 600 mg by mouth at bedtime. , Disp: , Rfl:    HYDROcodone bit-homatropine (HYDROMET) 5-1.5 MG/5ML syrup, Take 5 mLs by mouth every 6 (six) hours as needed for cough., Disp: 120 mL, Rfl: 0   ipratropium (ATROVENT) 0.03 % nasal spray, Place 2 sprays into both nostrils every 12 (twelve) hours., Disp: 30 mL, Rfl: 12   lisinopril (ZESTRIL) 20 MG tablet, Take 20 mg by mouth daily., Disp: , Rfl:    metFORMIN (GLUCOPHAGE-XR) 500 MG 24 hr tablet, Take 500 mg by mouth every evening. , Disp: , Rfl:    metoCLOPramide (REGLAN) 5 MG tablet, Take 1 tablet (5 mg total) by mouth every 8 (eight) hours as needed for nausea., Disp: 60 tablet, Rfl: 2   mupirocin cream (BACTROBAN) 2 %, Apply 1 application topically 2 (two) times daily., Disp: 30 g, Rfl: 0   omeprazole (PRILOSEC) 20 MG capsule, TAKE 2 CAPSULES BY MOUTH EVERY DAY, Disp: 180 capsule, Rfl: 3   ONETOUCH VERIO test strip, 1 each daily., Disp: , Rfl:    OZEMPIC, 0.25 OR 0.5 MG/DOSE, 2 MG/1.5ML SOPN, Inject 0.25 mg into the skin at bedtime. , Disp: , Rfl:    PRAVASTATIN SODIUM PO, Take 1  tablet by mouth daily. , Disp: , Rfl:    traMADol (ULTRAM) 50 MG tablet, TAKE 2 TABLETS BY MOUTH EVERY DAY AT BEDTIME AS NEEDED FOR PAIN, Disp: 60 tablet, Rfl: 0   zolpidem (AMBIEN) 5 MG tablet, TAKE 1 TABLET (5 MG TOTAL) BY MOUTH AT BEDTIME AS NEEDED FOR SLEEP., Disp: 30 tablet, Rfl: 0   cyclobenzaprine (FLEXERIL) 10 MG tablet, TAKE 1 TABLET BY MOUTH TWICE A DAY AS NEEDED FOR MUSCLE SPASM, Disp: 30 tablet, Rfl: 0

## 2021-06-13 ENCOUNTER — Other Ambulatory Visit: Payer: Self-pay | Admitting: Surgical

## 2021-06-13 MED ORDER — CYCLOBENZAPRINE HCL 10 MG PO TABS: ORAL_TABLET | ORAL | 0 refills | Status: DC

## 2021-06-13 NOTE — Telephone Encounter (Signed)
Ithink this may be a patient of Dr Marlou Sa

## 2021-06-15 ENCOUNTER — Encounter: Payer: Self-pay | Admitting: Pulmonary Disease

## 2021-06-16 NOTE — Progress Notes (Signed)
Office Visit Note  Patient: [name]             Date of Birth: November 09, 1971           MRN: [id number]             PCP: Donald Prose, MD Referring: Donald Prose, MD Visit Date: 06/30/2021 Occupation: @GUAROCC @  Subjective:  Medication management   History of Present Illness: [name] is a 50 y.o. female with a history of fibromyalgia, and osteoarthritis.  She was seen by Dr. Marlou Sa for the right shoulder pain.  She states she was diagnosed with frozen shoulder and had a cortisone injection.  She has better range of motion in her right shoulder joint now.  She continues to have some generalized pain from fibromyalgia.  She has been taking tramadol 2 tablets at bedtime.  She also takes Flexeril at bedtime for muscle spasms.  She has been taking Ambien for insomnia.  She still continues to have fatigue.  She has been walking for exercise.  She has some discomfort in the right trochanteric bursa.   Activities of Daily Living:  Patient reports morning stiffness for 1 hour.   Patient Reports nocturnal pain.  Difficulty dressing/grooming: Reports Difficulty climbing stairs: Reports Difficulty getting out of chair: Reports Difficulty using hands for taps, buttons, cutlery, and/or writing: Reports  Review of Systems  Constitutional:  Positive for fatigue.  HENT:  Positive for mouth dryness and nose dryness. Negative for mouth sores.   Eyes:  Positive for dryness. Negative for pain, itching and visual disturbance.  Respiratory:  Positive for cough. Negative for hemoptysis, shortness of breath and difficulty breathing.   Cardiovascular:  Negative for chest pain, palpitations and swelling in legs/feet.  Gastrointestinal:  Negative for abdominal pain, blood in stool, constipation and diarrhea.  Endocrine: Negative for increased urination.  Genitourinary:  Negative for painful urination.  Musculoskeletal:  Positive for joint pain, joint pain, myalgias, muscle weakness,  morning stiffness, muscle tenderness and myalgias. Negative for joint swelling.  Skin:  Negative for color change, rash, redness and sensitivity to sunlight.  Allergic/Immunologic: Positive for susceptible to infections.  Neurological:  Positive for dizziness, numbness, headaches, memory loss and weakness.  Hematological:  Negative for swollen glands.  Psychiatric/Behavioral:  Positive for confusion and sleep disturbance.    PMFS History:  Patient Active Problem List   Diagnosis Date Noted   Fibromyalgia 12/25/2016   Other fatigue 12/25/2016   Primary insomnia 12/25/2016   Primary osteoarthritis of both knees 12/25/2016   ANA positive 12/25/2016   History of gastroesophageal reflux (GERD) 12/25/2016   History of anemia 12/25/2016   Trochanteric bursitis of both hips 12/25/2016   Anemia, iron deficiency 04/18/2015   Dysphagia, pharyngoesophageal phase 11/14/2011   Esophageal reflux 11/14/2011    Past Medical History:  Diagnosis Date   ADD (attention deficit disorder with hyperactivity)    Allergic rhinitis    Anxiety    Arthritis    Depression    Dysmenorrhea    Fibromyalgia    GERD (gastroesophageal reflux disease)    H. pylori infection    per patient    HLD (hyperlipidemia)    HTN (hypertension)    Insomnia    Irritable bowel syndrome    Restless leg syndrome    Sleep apnea     Family History  Problem Relation Age of Onset   Breast cancer Maternal Grandmother    Diabetes Sister    Irritable bowel syndrome Sister  Heart disease Brother    Heart attack Brother    Stroke Mother    Heart disease Mother    Diabetes Sister    Irritable bowel syndrome Sister    Fibromyalgia Daughter    Colon cancer Neg Hx    Colon polyps Neg Hx    Esophageal cancer Neg Hx    Kidney disease Neg Hx    Gallbladder disease Neg Hx    Stomach cancer Neg Hx    Past Surgical History:  Procedure Laterality Date   BUNIONECTOMY Left 2008   DILATION AND CURETTAGE OF UTERUS  2004   EAR  CYST EXCISION Left 1983   KNEE ARTHROSCOPY Right    with knee cap adjustment   UPPER GASTROINTESTINAL ENDOSCOPY  06/29/2020   Social History   Social History Narrative   Not on file   Immunization History  Administered Date(s) Administered   PFIZER(Purple Top)SARS-COV-2 Vaccination 03/11/2020, 04/01/2020, 12/08/2020     Objective: Vital Signs: BP 106/75 (BP Location: Left Arm, Patient Position: Sitting, Cuff Size: Normal)   Pulse (!) 109   Ht 5\' 5"  (1.651 m)   Wt 225 lb (102.1 kg)   BMI 37.44 kg/m    Physical Exam Vitals and nursing note reviewed.  Constitutional:      Appearance: She is well-developed.  HENT:     Head: Normocephalic and atraumatic.  Eyes:     Conjunctiva/sclera: Conjunctivae normal.  Cardiovascular:     Rate and Rhythm: Normal rate and regular rhythm.     Heart sounds: Normal heart sounds.  Pulmonary:     Effort: Pulmonary effort is normal.     Breath sounds: Normal breath sounds.  Abdominal:     General: Bowel sounds are normal.     Palpations: Abdomen is soft.  Musculoskeletal:     Cervical back: Normal range of motion.  Lymphadenopathy:     Cervical: No cervical adenopathy.  Skin:    General: Skin is warm and dry.     Capillary Refill: Capillary refill takes less than 2 seconds.  Neurological:     Mental Status: She is alert and oriented to person, place, and time.  Psychiatric:        Behavior: Behavior normal.     Musculoskeletal Exam: C-spine was in good range of motion.  Shoulder joints, elbow joints, wrist joints, MCPs PIPs and DIPs with good range of motion.  Hip joints in good range of motion.  She is some tenderness on palpation of the right trochanteric bursa.  Knee joints in good range of motion without synovitis.  There was no tenderness over ankles or MTPs.  CDAI Exam: CDAI Score: -- Patient Global: --; Provider Global: -- Swollen: --; Tender: -- Joint Exam 06/30/2021   No joint exam has been documented for this visit    There is currently no information documented on the homunculus. Go to the Rheumatology activity and complete the homunculus joint exam.  Investigation: No additional findings.  Imaging: No results found.  Recent Labs: Lab Results  Component Value Date   WBC 10.1 06/29/2020   HGB 12.7 06/29/2020   PLT 409.0 (H) 06/29/2020   NA 137 06/29/2020   K 3.3 (L) 06/29/2020   CL 101 06/29/2020   CO2 28 06/29/2020   GLUCOSE 191 (H) 06/29/2020   BUN 8 06/29/2020   CREATININE 0.78 06/29/2020   BILITOT 0.3 06/29/2020   ALKPHOS 114 06/29/2020   AST 12 06/29/2020   ALT 14 06/29/2020   PROT 7.8 06/29/2020  ALBUMIN 4.1 06/29/2020   CALCIUM 9.2 06/29/2020   GFRAA >60 12/28/2019    Speciality Comments: No specialty comments available.  Procedures:  No procedures performed Allergies: Patient has no known allergies.   Assessment / Plan:     Visit Diagnoses: Fibromyalgia -she continues to have some generalized pain and discomfort.  She is on tramadol 50 mg 1 tablet p.o. twice  daily as needed for pain relief.  Which she is getting from her PCP now.  Chronic right shoulder pain - The x-ray of the shoulder joint was unremarkable on 12/30/2020. cortisone injection performed on 12/30/2020.  She had right glenohumeral joint injection by Dr. Marlou Sa.  Trochanteric bursitis of both hips-she has been experiencing some right trochanteric bursa pain.  IT band stretches were given.  Primary osteoarthritis of both knees-she is off-and-on discomfort in her knee joints.  She is doing quite well today.  She had no warmth swelling or effusion in her knees today.  Other fatigue-she continues to have some fatigue.  Primary insomnia - Ambien 5 mg by mouth at bedtime for insomnia.   ANA positive - She has no clinical features of systemic lupus.She denies any history of oral ulcers, nasal ulcers, Raynaud's phenomenon, malar rash, photosensitivity or lymphadenopathy.  Vitamin D deficiency  History of  gastroesophageal reflux (GERD)  History of diabetes mellitus  History of anemia  Orders: No orders of the defined types were placed in this encounter.  No orders of the defined types were placed in this encounter.    Follow-Up Instructions: Return in about 6 months (around 12/31/2021) for FMS, OA.   Bo Merino, MD  Note - This record has been created using Editor, commissioning.  Chart creation errors have been sought, but may not always  have been located. Such creation errors do not reflect on  the standard of medical care.

## 2021-06-19 ENCOUNTER — Other Ambulatory Visit: Payer: Self-pay

## 2021-06-19 DIAGNOSIS — R059 Cough, unspecified: Secondary | ICD-10-CM

## 2021-06-20 DIAGNOSIS — G4733 Obstructive sleep apnea (adult) (pediatric): Secondary | ICD-10-CM | POA: Diagnosis not present

## 2021-06-20 NOTE — Telephone Encounter (Signed)
Pt requesting Hydromet, pt would like to have refill, just refilled last week.

## 2021-06-22 MED ORDER — HYDROCODONE BIT-HOMATROP MBR 5-1.5 MG/5ML PO SOLN: 5.0000 mL | Freq: Four times a day (QID) | ORAL | 0 refills | Status: DC | PRN

## 2021-06-29 NOTE — Telephone Encounter (Signed)
Called pt and left voicemail in regards to her medication, would like to know how often she is using the HYDROcodone bit- homatropine( HYDROMET) 5-1.5mg /45ml?  Dr Erin Fulling refilled the [facility] but states patient has gone through it a bit to fast.

## 2021-06-30 ENCOUNTER — Encounter: Payer: Self-pay | Admitting: Rheumatology

## 2021-06-30 ENCOUNTER — Ambulatory Visit: Payer: Medicare Other | Admitting: Rheumatology

## 2021-06-30 ENCOUNTER — Other Ambulatory Visit: Payer: Self-pay

## 2021-06-30 VITALS — BP 106/75 | HR 109 | Ht 65.0 in | Wt 225.0 lb

## 2021-06-30 DIAGNOSIS — M170 Bilateral primary osteoarthritis of knee: Secondary | ICD-10-CM

## 2021-06-30 DIAGNOSIS — E559 Vitamin D deficiency, unspecified: Secondary | ICD-10-CM | POA: Diagnosis not present

## 2021-06-30 DIAGNOSIS — F5101 Primary insomnia: Secondary | ICD-10-CM | POA: Diagnosis not present

## 2021-06-30 DIAGNOSIS — M7061 Trochanteric bursitis, right hip: Secondary | ICD-10-CM

## 2021-06-30 DIAGNOSIS — R768 Other specified abnormal immunological findings in serum: Secondary | ICD-10-CM

## 2021-06-30 DIAGNOSIS — G8929 Other chronic pain: Secondary | ICD-10-CM

## 2021-06-30 DIAGNOSIS — Z8719 Personal history of other diseases of the digestive system: Secondary | ICD-10-CM

## 2021-06-30 DIAGNOSIS — R5383 Other fatigue: Secondary | ICD-10-CM

## 2021-06-30 DIAGNOSIS — M797 Fibromyalgia: Secondary | ICD-10-CM

## 2021-06-30 DIAGNOSIS — Z8639 Personal history of other endocrine, nutritional and metabolic disease: Secondary | ICD-10-CM | POA: Diagnosis not present

## 2021-06-30 DIAGNOSIS — Z862 Personal history of diseases of the blood and blood-forming organs and certain disorders involving the immune mechanism: Secondary | ICD-10-CM

## 2021-06-30 DIAGNOSIS — M25511 Pain in right shoulder: Secondary | ICD-10-CM | POA: Diagnosis not present

## 2021-06-30 DIAGNOSIS — M7062 Trochanteric bursitis, left hip: Secondary | ICD-10-CM

## 2021-06-30 NOTE — Patient Instructions (Signed)
Iliotibial Band Syndrome Rehab Ask your health care provider which exercises are safe for you. Do exercises exactly as told by your health care provider and adjust them as directed. It is normal to feel mild stretching, pulling, tightness, or discomfort as you do these exercises. Stop right away if you feel sudden pain or your pain gets significantly worse. Do not begin these exercises until told by your health care provider. Stretching and range-of-motion exercises These exercises warm up your muscles and joints and improve the movement andflexibility of your hip and pelvis. Quadriceps stretch, prone  Lie on your abdomen (prone position) on a firm surface, such as a bed or padded floor. Bend your left / right knee and reach back to hold your ankle or pant leg. If you cannot reach your ankle or pant leg, loop a belt around your foot and grab the belt instead. Gently pull your heel toward your buttocks. Your knee should not slide out to the side. You should feel a stretch in the front of your thigh and knee (quadriceps). Hold this position for __________ seconds. Repeat __________ times. Complete this exercise __________ times a day. Iliotibial band stretch An iliotibial band is a strong band of muscle tissue that runs from the outer side of your hip to the outer side of your thigh and knee. Lie on your side with your left / right leg in the top position. Bend both of your knees and grab your left / right ankle. Stretch out your bottom arm to help you balance. Slowly bring your top knee back so your thigh goes behind your trunk. Slowly lower your top leg toward the floor until you feel a gentle stretch on the outside of your left / right hip and thigh. If you do not feel a stretch and your knee will not fall farther, place the heel of your other foot on top of your knee and pull your knee down toward the floor with your foot. Hold this position for __________ seconds. Repeat __________ times.  Complete this exercise __________ times a day. Strengthening exercises These exercises build strength and endurance in your hip and pelvis. Enduranceis the ability to use your muscles for a long time, even after they get tired. Straight leg raises, side-lying This exercise strengthens the muscles that rotate the leg at the hip and move it away from your body (hip abductors). Lie on your side with your left / right leg in the top position. Lie so your head, shoulder, hip, and knee line up. You may bend your bottom knee to help you balance. Roll your hips slightly forward so your hips are stacked directly over each other and your left / right knee is facing forward. Tense the muscles in your outer thigh and lift your top leg 4-6 inches (10-15 cm). Hold this position for __________ seconds. Slowly lower your leg to return to the starting position. Let your muscles relax completely before doing another repetition. Repeat __________ times. Complete this exercise __________ times a day. Leg raises, prone This exercise strengthens the muscles that move the hips backward (hip extensors). Lie on your abdomen (prone position) on your bed or a firm surface. You can put a pillow under your hips if that is more comfortable for your lower back. Bend your left / right knee so your foot is straight up in the air. Squeeze your buttocks muscles and lift your left / right thigh off the bed. Do not let your back arch. Tense your thigh   muscle as hard as you can without increasing any knee pain. Hold this position for __________ seconds. Slowly lower your leg to return to the starting position and allow it to relax completely. Repeat __________ times. Complete this exercise __________ times a day. Hip hike Stand sideways on a bottom step. Stand on your left / right leg with your other foot unsupported next to the step. You can hold on to a railing or wall for balance if needed. Keep your knees straight and your  torso square. Then lift your left / right hip up toward the ceiling. Slowly let your left / right hip lower toward the floor, past the starting position. Your foot should get closer to the floor. Do not lean or bend your knees. Repeat __________ times. Complete this exercise __________ times a day. This information is not intended to replace advice given to you by your health care provider. Make sure you discuss any questions you have with your healthcare provider. Document Revised: 02/10/2020 Document Reviewed: 02/10/2020 Elsevier Patient Education  2022 [employer]  

## 2021-07-03 ENCOUNTER — Telehealth: Payer: Self-pay

## 2021-07-03 NOTE — Telephone Encounter (Signed)
Dr. Lynnda Child office (patient's PCP) called stating they received a message from patient requesting information be sent to Dr. Estanislado Pandy.  They left a message on patient's home number and called the work number she listed on her paperwork which is our office #.  Please call back at [phone] and let the office know which information Dr. Estanislado Pandy is requesting.

## 2021-07-03 NOTE — Telephone Encounter (Signed)
Attempted to contact the patient and recording come on "I'm sorry your call can not be completed at this time. Please try your call again later."

## 2021-07-04 NOTE — Telephone Encounter (Signed)
Spoke with [facility] and advised we have not requested any information from their office. Advised that it may have been an misunderstanding on the patient's behalf.

## 2021-07-06 NOTE — Telephone Encounter (Signed)
Called patient due to the nature of her message but she did not answer and her VM is not setup.

## 2021-07-06 NOTE — Telephone Encounter (Signed)
Called the patient with the number she provided, it was a wrong number. Called her daughter's number, it went straight to VM. I have asked her to call the office.

## 2021-07-06 NOTE — Telephone Encounter (Signed)
Called and spoke with the pt (phone number [phone] She states that she is having increased cough and SOB past 2 days  Cough has been waking her up in the night, and is prod with white, thick, sputum  She states that she is sometimes SOB with just sitting, and always out of breath walking from room to room at home She is not wheezing, having chest tightness or f/c/s  She has finished her hydromet and not tried anything otc for her cough  She is using atrovent and flonase ns  She is vaccinated x 3 against covid and has not been tested recently  On ACE since Sept 2021  Please advise, thanks  Current Outpatient Medications on File Prior to Visit  Medication Sig Dispense Refill   albuterol (VENTOLIN HFA) 108 (90 Base) MCG/ACT inhaler Inhale 2 puffs into the lungs in the morning, at noon, in the evening, and at bedtime.     amLODipine (NORVASC) 5 MG tablet Take 5 mg by mouth daily.     buPROPion (WELLBUTRIN XL) 150 MG 24 hr tablet Take 150 mg by mouth every morning.     cyclobenzaprine (FLEXERIL) 10 MG tablet TAKE 1 TABLET BY MOUTH TWICE A DAY AS NEEDED FOR MUSCLE SPASM 30 tablet 0   diclofenac Sodium (VOLTAREN) 1 % GEL APPLY 2-4 GRAMS TO AFFECTED JOINT 4 TIMES DAILY AS NEEDED. 400 g 2   escitalopram (LEXAPRO) 20 MG tablet Take 20 mg by mouth at bedtime.      fluticasone (FLONASE) 50 MCG/ACT nasal spray Place 1 spray into both nostrils daily. 16 g 2   gabapentin (NEURONTIN) 300 MG capsule Take 600 mg by mouth at bedtime.      ipratropium (ATROVENT) 0.03 % nasal spray Place 2 sprays into both nostrils every 12 (twelve) hours. 30 mL 12   lisinopril (ZESTRIL) 20 MG tablet Take 20 mg by mouth daily.     metFORMIN (GLUCOPHAGE-XR) 500 MG 24 hr tablet Take 500 mg by mouth every evening.      metoCLOPramide (REGLAN) 5 MG tablet Take 1 tablet (5 mg total) by mouth every 8 (eight) hours as needed for nausea. 60 tablet 2   mupirocin cream (BACTROBAN) 2 % Apply 1 application topically 2 (two) times  daily. 30 g 0   naproxen (NAPROSYN) 500 MG tablet Take 500 mg by mouth as needed.     omeprazole (PRILOSEC) 20 MG capsule TAKE 2 CAPSULES BY MOUTH EVERY DAY 180 capsule 3   ONETOUCH VERIO test strip 1 each daily.     OZEMPIC, 0.25 OR 0.5 MG/DOSE, 2 MG/1.5ML SOPN Inject 0.25 mg into the skin at bedtime.      PRAVASTATIN SODIUM PO Take 1 tablet by mouth daily.      traMADol (ULTRAM) 50 MG tablet Take 50 mg by mouth 2 (two) times daily.     zolpidem (AMBIEN) 5 MG tablet TAKE 1 TABLET (5 MG TOTAL) BY MOUTH AT BEDTIME AS NEEDED FOR SLEEP. 30 tablet 0   No current facility-administered medications on file prior to visit.   No Known Allergies

## 2021-07-06 NOTE — Telephone Encounter (Signed)
Sorry to hear she is no better. Seen x 2 May and June for cough ,  CT unrevealing  Needs ov , she is on ACE , may need to d/c ,  Can set up for video visit or office visit tomorrow if not openings.  If no openings , can double book me tomorrow for videovisit or she can come to [place] office   Please contact office for sooner follow up if symptoms do not improve or worsen or seek emergency care

## 2021-07-06 NOTE — Telephone Encounter (Signed)
Called pt and there was no answer-LMTCB °

## 2021-07-07 ENCOUNTER — Telehealth: Payer: Self-pay

## 2021-07-07 ENCOUNTER — Telehealth (INDEPENDENT_AMBULATORY_CARE_PROVIDER_SITE_OTHER): Payer: Medicare Other | Admitting: Adult Health

## 2021-07-07 ENCOUNTER — Other Ambulatory Visit: Payer: Self-pay

## 2021-07-07 ENCOUNTER — Encounter: Payer: Self-pay | Admitting: Adult Health

## 2021-07-07 DIAGNOSIS — R911 Solitary pulmonary nodule: Secondary | ICD-10-CM

## 2021-07-07 MED ORDER — BENZONATATE 200 MG PO CAPS: 200.0000 mg | ORAL_CAPSULE | Freq: Three times a day (TID) | ORAL | 1 refills | Status: AC | PRN

## 2021-07-07 NOTE — Telephone Encounter (Signed)
Visit was completed

## 2021-07-07 NOTE — Patient Instructions (Addendum)
Delsym 2 tsp Twice daily  for cough As needed   Tessalon Three times a day  for cough as needed.  Continue on Prilosec '20mg'$  daily  Add Pepcid '20mg'$  At bedtime  .  Begin Chlorpheniramine 4 mg At bedtime   Sips of water to soothe throat .  NO MINTS  Discuss with Primary MD to change Lisinopril as may be making your cough worse.  Albuterol inhaler As needed  wheezing  Set up PFT on return 09/12/21  CT chest without contrast in 1 year  Follow up with Dr Erin Fulling in 8 weeks as planned and As needed   Please contact office for sooner follow up if symptoms do not improve or worsen or seek emergency care

## 2021-07-07 NOTE — Progress Notes (Signed)
Virtual Visit via Video Note  I connected with [name] on 07/07/21 at  2:00 PM EDT by a video enabled telemedicine application and verified that I am speaking with the correct person using two identifiers.  Location: Patient: Home  Provider: Office    I discussed the limitations of evaluation and management by telemedicine and the availability of in person appointments. The patient expressed understanding and agreed to proceed.  History of Present Illness: 50 year old female former smoker seen for pulmonary consult May 09, 2021 for chronic cough Medical history significant for diabetes, GERD, dysphagia and fibromyalgia  Today's virtual video visit is a working visit for ongoing cough.  Patient says she has had patient says she has a cough on and off for the last 12 years.  Over the last several months her cough has increased she has had some episodes where she had severe coughing fits and blood-tinged mucus.  She does use an albuterol inhaler and some over-the-counter cough syrups without much relief.  Last visit she was started on Hydromet cough syrup.  She says it only helped a little bit.  She has continued to cough without any significant improvement.  She was set up for a high-resolution CT chest completed on May 17, 2021 that showed no evidence of fibrotic interstitial lung disease.  Incidental 4 mm left upper lobe nodule.  Patient complains that she has ongoing reflux is on Prilosec daily.  She does have some postnasal drainage.  She has some intermittent episodes where she sees some blood-tinged mucus.  She denies any fever, discolored mucus, chest pain, orthopnea. Has tried prednisone in the past without any improvement and has significant side effects   Past Medical History:  Diagnosis Date   ADD (attention deficit disorder with hyperactivity)    Allergic rhinitis    Anxiety    Arthritis    Depression    Dysmenorrhea    Fibromyalgia    GERD (gastroesophageal  reflux disease)    H. pylori infection    per patient    HLD (hyperlipidemia)    HTN (hypertension)    Insomnia    Irritable bowel syndrome    Restless leg syndrome    Sleep apnea     Current Outpatient Medications on File Prior to Visit  Medication Sig Dispense Refill   albuterol (VENTOLIN HFA) 108 (90 Base) MCG/ACT inhaler Inhale 2 puffs into the lungs in the morning, at noon, in the evening, and at bedtime.     amLODipine (NORVASC) 5 MG tablet Take 5 mg by mouth daily.     buPROPion (WELLBUTRIN XL) 150 MG 24 hr tablet Take 150 mg by mouth every morning.     cyclobenzaprine (FLEXERIL) 10 MG tablet TAKE 1 TABLET BY MOUTH TWICE A DAY AS NEEDED FOR MUSCLE SPASM 30 tablet 0   diclofenac Sodium (VOLTAREN) 1 % GEL APPLY 2-4 GRAMS TO AFFECTED JOINT 4 TIMES DAILY AS NEEDED. 400 g 2   escitalopram (LEXAPRO) 20 MG tablet Take 20 mg by mouth at bedtime.      fluticasone (FLONASE) 50 MCG/ACT nasal spray Place 1 spray into both nostrils daily. 16 g 2   gabapentin (NEURONTIN) 300 MG capsule Take 600 mg by mouth at bedtime.      ipratropium (ATROVENT) 0.03 % nasal spray Place 2 sprays into both nostrils every 12 (twelve) hours. 30 mL 12   lisinopril (ZESTRIL) 20 MG tablet Take 20 mg by mouth daily.     metFORMIN (GLUCOPHAGE-XR) 500 MG 24 hr  tablet Take 500 mg by mouth every evening.      metoCLOPramide (REGLAN) 5 MG tablet Take 1 tablet (5 mg total) by mouth every 8 (eight) hours as needed for nausea. 60 tablet 2   mupirocin cream (BACTROBAN) 2 % Apply 1 application topically 2 (two) times daily. 30 g 0   naproxen (NAPROSYN) 500 MG tablet Take 500 mg by mouth as needed.     omeprazole (PRILOSEC) 20 MG capsule TAKE 2 CAPSULES BY MOUTH EVERY DAY 180 capsule 3   ONETOUCH VERIO test strip 1 each daily.     OZEMPIC, 0.25 OR 0.5 MG/DOSE, 2 MG/1.5ML SOPN Inject 0.25 mg into the skin at bedtime.      PRAVASTATIN SODIUM PO Take 1 tablet by mouth daily.      traMADol (ULTRAM) 50 MG tablet Take 50 mg by mouth  2 (two) times daily.     zolpidem (AMBIEN) 5 MG tablet TAKE 1 TABLET (5 MG TOTAL) BY MOUTH AT BEDTIME AS NEEDED FOR SLEEP. 30 tablet 0   No current facility-administered medications on file prior to visit.         Observations/Objective: Speaks in full sentences with no audible distress. NAD     Assessment and Plan: Chronic cough/upper airway cough syndrome-questionable etiology may be triggered by postnasal drainage, reflux.  High resolution CT chest without evidence of interstitial lung disease. Patient is on ACE inhibitor.  This was started in fall 2021 per patient.  This is most likely not the culprit but definitely not helping her chronic cough.  Would recommend changing ACE inhibitor.  We will leave this to her primary care provider.  Patient will get in touch with them today. Will begin aggressive therapy for cough control, GERD management and postnasal drainage treatment. Check PFTs on return.    Small lung nodule 4 mm left upper lobe.  Repeat CT chest in 1 year  Plan  Patient Instructions  Delsym 2 tsp Twice daily  for cough As needed   Tessalon Three times a day  for cough as needed.  Continue on Prilosec '20mg'$  daily  Add Pepcid '20mg'$  At bedtime  .  Begin Chlorpheniramine 4 mg At bedtime   Sips of water to soothe throat .  NO MINTS  Discuss with Primary MD to change Lisinopril as may be making your cough worse.  Albuterol inhaler As needed  wheezing  Set up PFT on return 09/12/21  CT chest without contrast in 1 year  Follow up with Dr Erin Fulling in 8 weeks as planned and As needed   Please contact office for sooner follow up if symptoms do not improve or worsen or seek emergency care      Follow Up Instructions: Follow-up in 4 weeks with PFTs    I discussed the assessment and treatment plan with the patient. The patient was provided an opportunity to ask questions and all were answered. The patient agreed with the plan and demonstrated an understanding of the  instructions.   The patient was advised to call back or seek an in-person evaluation if the symptoms worsen or if the condition fails to improve as anticipated.  I provided 30  minutes of non-face-to-face time during this encounter.   Rexene Edison, NP

## 2021-07-07 NOTE — Addendum Note (Signed)
Addended by: Claudette Head A on: 07/07/2021 03:20 PM   Modules accepted: Orders

## 2021-07-07 NOTE — Telephone Encounter (Signed)
ATC patient to scheduled PFT in [place] prior to 09/12/2021 visit.  Received recording that call could not be completed.  Will call back

## 2021-07-07 NOTE — Telephone Encounter (Signed)
LVM in regards to get patient ready for video visit. Will attempt again.

## 2021-07-10 NOTE — Telephone Encounter (Signed)
ATC patient x2--received recording that call could not be completed at this time.  Send email message requesting that patient contact our office to schedule PFT.  Will close encounter per office protocol.

## 2021-07-13 ENCOUNTER — Other Ambulatory Visit: Payer: Self-pay | Admitting: Surgical

## 2021-08-14 ENCOUNTER — Encounter: Payer: Self-pay | Admitting: Orthopedic Surgery

## 2021-08-14 DIAGNOSIS — M797 Fibromyalgia: Secondary | ICD-10-CM | POA: Diagnosis not present

## 2021-08-14 DIAGNOSIS — E559 Vitamin D deficiency, unspecified: Secondary | ICD-10-CM | POA: Diagnosis not present

## 2021-08-14 DIAGNOSIS — E7800 Pure hypercholesterolemia, unspecified: Secondary | ICD-10-CM | POA: Diagnosis not present

## 2021-08-14 DIAGNOSIS — Z0000 Encounter for general adult medical examination without abnormal findings: Secondary | ICD-10-CM | POA: Diagnosis not present

## 2021-08-14 DIAGNOSIS — E1169 Type 2 diabetes mellitus with other specified complication: Secondary | ICD-10-CM | POA: Diagnosis not present

## 2021-08-14 DIAGNOSIS — G4700 Insomnia, unspecified: Secondary | ICD-10-CM | POA: Diagnosis not present

## 2021-08-14 DIAGNOSIS — Z1389 Encounter for screening for other disorder: Secondary | ICD-10-CM | POA: Diagnosis not present

## 2021-08-14 DIAGNOSIS — I10 Essential (primary) hypertension: Secondary | ICD-10-CM | POA: Diagnosis not present

## 2021-08-20 ENCOUNTER — Other Ambulatory Visit: Payer: Self-pay | Admitting: Surgical

## 2021-08-29 DIAGNOSIS — Z1231 Encounter for screening mammogram for malignant neoplasm of breast: Secondary | ICD-10-CM | POA: Diagnosis not present

## 2021-08-31 DIAGNOSIS — E559 Vitamin D deficiency, unspecified: Secondary | ICD-10-CM | POA: Diagnosis not present

## 2021-08-31 DIAGNOSIS — Z7984 Long term (current) use of oral hypoglycemic drugs: Secondary | ICD-10-CM | POA: Diagnosis not present

## 2021-08-31 DIAGNOSIS — E1169 Type 2 diabetes mellitus with other specified complication: Secondary | ICD-10-CM | POA: Diagnosis not present

## 2021-08-31 DIAGNOSIS — I10 Essential (primary) hypertension: Secondary | ICD-10-CM | POA: Diagnosis not present

## 2021-08-31 DIAGNOSIS — E7800 Pure hypercholesterolemia, unspecified: Secondary | ICD-10-CM | POA: Diagnosis not present

## 2021-09-12 ENCOUNTER — Ambulatory Visit (INDEPENDENT_AMBULATORY_CARE_PROVIDER_SITE_OTHER): Payer: Medicare Other | Admitting: Pulmonary Disease

## 2021-09-12 ENCOUNTER — Encounter: Payer: Self-pay | Admitting: Pulmonary Disease

## 2021-09-12 ENCOUNTER — Other Ambulatory Visit: Payer: Self-pay

## 2021-09-12 VITALS — BP 126/82 | HR 108 | Temp 98.2°F | Ht 66.0 in | Wt 224.0 lb

## 2021-09-12 DIAGNOSIS — R0982 Postnasal drip: Secondary | ICD-10-CM | POA: Diagnosis not present

## 2021-09-12 DIAGNOSIS — Z23 Encounter for immunization: Secondary | ICD-10-CM | POA: Diagnosis not present

## 2021-09-12 DIAGNOSIS — R059 Cough, unspecified: Secondary | ICD-10-CM | POA: Diagnosis not present

## 2021-09-12 DIAGNOSIS — J4530 Mild persistent asthma, uncomplicated: Secondary | ICD-10-CM | POA: Diagnosis not present

## 2021-09-12 DIAGNOSIS — I8391 Asymptomatic varicose veins of right lower extremity: Secondary | ICD-10-CM | POA: Diagnosis not present

## 2021-09-12 NOTE — Progress Notes (Signed)
Synopsis: Referred in May 2022 for chronic cough by Wray Kearns, MD  Subjective:   PATIENT ID: [name], [name]  HPI   Chief Complaint  Patient presents with   Follow-up    Cough has been better since last visit. Minimal cough.    [name] is a 50 year old woman, former smoker with history of GERD, dysphagia, and fibromyalgia returns to pulmonary clinic for chronic cough.  She reports her cough is much improved after stopping lisinopril on 06/2021 after seeing Rexene Edison, NP. She continues to have intermittent cough that is sporadic. She has no night time awakenings from the cough. She denies sputum production. She continues to have on-going reflux symptoms despite 40mg  prilosec daily and sleeping on a wedge pillow. Her GERD symptoms are worse in the morning time. She also has intermittent sinus congestion. She is using flonase daily and ipratropium nasal spray as needed. She uses albuterol inhaler as needed for the cough which she reports was mainly from summer allergies.   OV 06/12/21 She reports she is still coughing up blood-tinged sputum.  She is also blowing her nose with bloody secretions.  She has noted that her sinuses have been draining a lot these past few weeks.  She continues to deny any shortness of breath or wheezing with the cough.  HRCT chest on 05/17/2021 shows unremarkable parenchyma and airways.  No nodules or effusions noted.  OV 05/09/21 She reports having cough since last March.  She has been coughing up clear mucus until recently she has noted blood-tinged sputum.  She describes coughing episodes with posttussive emesis.  She has tried using albuterol and cough syrup without relief of her cough.  She does report shortness of breath and chest tightness with the cough.  She denies wheezing.  She is followed by GI for GERD and ongoing symptoms of nausea and vomiting.  She does report history of  dysphagia requiring esophageal dilation.  She had EGD last year with biopsies showing H. pylori positivity in which she has completed treatment.  She is currently taking omeprazole 20 mg twice daily.  She also sleeps with the head of her bed elevated at night.  She denies any epistaxis but she reports a sore in her left nasal passage that can lead to blood-tinged nasal secretions and irritation.   She quit smoking in 1995 and has about a 12 pack year smoking history.  She denies history of blood clots.  Past Medical History:  Diagnosis Date   ADD (attention deficit disorder with hyperactivity)    Allergic rhinitis    Anxiety    Arthritis    Depression    Dysmenorrhea    Fibromyalgia    GERD (gastroesophageal reflux disease)    H. pylori infection    per patient    HLD (hyperlipidemia)    HTN (hypertension)    Insomnia    Irritable bowel syndrome    Restless leg syndrome    Sleep apnea      Family History  Problem Relation Age of Onset   Breast cancer Maternal Grandmother    Diabetes Sister    Irritable bowel syndrome Sister    Heart disease Brother    Heart attack Brother    Stroke Mother    Heart disease Mother    Diabetes Sister    Irritable bowel syndrome Sister    Fibromyalgia Daughter    Colon cancer Neg Hx    Colon polyps  Neg Hx    Esophageal cancer Neg Hx    Kidney disease Neg Hx    Gallbladder disease Neg Hx    Stomach cancer Neg Hx      Social History   Socioeconomic History   Marital status: Widowed    Spouse name: Not on file   Number of children: 1   Years of education: Not on file   Highest education level: Not on file  Occupational History   Occupation: Disabled  Tobacco Use   Smoking status: Former    Packs/day: 0.10    Years: 12.00    Pack years: 1.20    Types: Cigarettes    Quit date: 12/27/1993    Years since quitting: 27.7   Smokeless tobacco: Never  Vaping Use   Vaping Use: Never used  Substance and Sexual Activity   Alcohol use:  No   Drug use: No   Sexual activity: Yes  Other Topics Concern   Not on file  Social History Narrative   Not on file   Social Determinants of Health   Financial Resource Strain: Not on file  Food Insecurity: Not on file  Transportation Needs: Not on file  Physical Activity: Not on file  Stress: Not on file  Social Connections: Not on file  Intimate Partner Violence: Not on file     No Known Allergies   Outpatient Medications Prior to Visit  Medication Sig Dispense Refill   albuterol (VENTOLIN HFA) 108 (90 Base) MCG/ACT inhaler Inhale 2 puffs into the lungs in the morning, at noon, in the evening, and at bedtime.     amLODipine (NORVASC) 5 MG tablet Take 10 mg by mouth daily.     amphetamine-dextroamphetamine (ADDERALL) 10 MG tablet Take 10 mg by mouth daily with breakfast.     benzonatate (TESSALON) 200 MG capsule Take 1 capsule (200 mg total) by mouth 3 (three) times daily as needed for cough. 45 capsule 1   buPROPion (WELLBUTRIN XL) 150 MG 24 hr tablet Take 150 mg by mouth every morning.     cyclobenzaprine (FLEXERIL) 10 MG tablet TAKE 1 TABLET BY MOUTH TWICE A DAY AS NEEDED FOR MUSCLE SPASM 30 tablet 0   diclofenac Sodium (VOLTAREN) 1 % GEL APPLY 2-4 GRAMS TO AFFECTED JOINT 4 TIMES DAILY AS NEEDED. 400 g 2   escitalopram (LEXAPRO) 20 MG tablet Take 20 mg by mouth at bedtime.      fluticasone (FLONASE) 50 MCG/ACT nasal spray Place 1 spray into both nostrils daily. (Patient taking differently: Place 2 sprays into both nostrils daily.) 16 g 2   gabapentin (NEURONTIN) 300 MG capsule Take 600 mg by mouth at bedtime.      ipratropium (ATROVENT) 0.03 % nasal spray Place 2 sprays into both nostrils every 12 (twelve) hours. 30 mL 12   metFORMIN (GLUCOPHAGE-XR) 500 MG 24 hr tablet Take 500 mg by mouth every evening.      metoCLOPramide (REGLAN) 5 MG tablet Take 1 tablet (5 mg total) by mouth every 8 (eight) hours as needed for nausea. 60 tablet 2   mupirocin cream (BACTROBAN) 2 % Apply 1  application topically 2 (two) times daily. 30 g 0   naproxen (NAPROSYN) 500 MG tablet Take 500 mg by mouth as needed.     omeprazole (PRILOSEC) 20 MG capsule TAKE 2 CAPSULES BY MOUTH EVERY DAY 180 capsule 3   ONETOUCH VERIO test strip 1 each daily.     OZEMPIC, 0.25 OR 0.5 MG/DOSE, 2 MG/1.5ML SOPN Inject  0.25 mg into the skin at bedtime.      PRAVASTATIN SODIUM PO Take 1 tablet by mouth daily.      traMADol (ULTRAM) 50 MG tablet Take 50 mg by mouth 2 (two) times daily.     zolpidem (AMBIEN) 5 MG tablet TAKE 1 TABLET (5 MG TOTAL) BY MOUTH AT BEDTIME AS NEEDED FOR SLEEP. 30 tablet 0   lisinopril (ZESTRIL) 20 MG tablet Take 20 mg by mouth daily.     No facility-administered medications prior to visit.   Review of Systems  Constitutional:  Negative for chills, fever, malaise/fatigue and weight loss.  HENT:  Negative for congestion, nosebleeds, sinus pain and sore throat.   Eyes: Negative.   Respiratory:  Positive for cough. Negative for hemoptysis, sputum production, shortness of breath and wheezing.   Cardiovascular:  Negative for chest pain, palpitations, orthopnea, claudication and leg swelling.  Gastrointestinal:  Positive for heartburn. Negative for abdominal pain, nausea and vomiting.  Genitourinary: Negative.   Musculoskeletal:  Negative for joint pain and myalgias.  Skin:  Negative for rash.  Neurological:  Negative for weakness.  Endo/Heme/Allergies: Negative.   Psychiatric/Behavioral: Negative.     Objective:   Vitals:   09/12/21 1336  BP: 126/82  Pulse: (!) 108  Temp: 98.2 F (36.8 C)  SpO2: 98%  Weight: 224 lb (101.6 kg)  Height: 5\' 6"  (1.676 m)   Physical Exam Constitutional:      General: She is not in acute distress.    Appearance: She is obese. She is not ill-appearing.  HENT:     Head: Normocephalic and atraumatic.     Mouth/Throat:     Mouth: Mucous membranes are moist.     Pharynx: Oropharynx is clear.  Eyes:     General: No scleral icterus.     Conjunctiva/sclera: Conjunctivae normal.     Pupils: Pupils are equal, round, and reactive to light.  Cardiovascular:     Rate and Rhythm: Normal rate and regular rhythm.     Pulses: Normal pulses.     Heart sounds: Normal heart sounds. No murmur heard. Pulmonary:     Effort: Pulmonary effort is normal.     Breath sounds: Normal breath sounds. No wheezing, rhonchi or rales.  Musculoskeletal:     Right lower leg: No edema.     Left lower leg: No edema.  Skin:    General: Skin is warm and dry.  Neurological:     General: No focal deficit present.     Mental Status: She is alert.    CBC    Component Value Date/Time   WBC 10.1 06/29/2020 1155   RBC 5.17 (H) 06/29/2020 1155   HGB 12.7 06/29/2020 1155   HCT 39.4 06/29/2020 1155   PLT 409.0 (H) 06/29/2020 1155   MCV 76.2 (L) 06/29/2020 1155   MCH 22.7 (L) 12/28/2019 1617   MCHC 32.3 06/29/2020 1155   RDW 17.6 (H) 06/29/2020 1155   LYMPHSABS 4.3 (H) 06/29/2020 1155   MONOABS 0.6 06/29/2020 1155   EOSABS 0.2 06/29/2020 1155   BASOSABS 0.1 06/29/2020 1155   BMP Latest Ref Rng & Units 06/29/2020 12/28/2019 12/28/2019  Glucose 70 - 99 mg/dL 191(H) 519(HH) 593(HH)  BUN 6 - 23 mg/dL 8 9 11   Creatinine 0.40 - 1.20 mg/dL 0.78 0.60 0.89  Sodium 135 - 145 mEq/L 137 137 132(L)  Potassium 3.5 - 5.1 mEq/L 3.3(L) 3.9 3.7  Chloride 96 - 112 mEq/L 101 100 97(L)  CO2 19 - 32 mEq/L  28 - 24  Calcium 8.4 - 10.5 mg/dL 9.2 - 9.0   Chest imaging: CT Chest 05/17/21 Mediastinum/Nodes: No enlarged mediastinal, hilar, or axillary lymph nodes. Thyroid gland, trachea, and esophagus demonstrate no significant findings.   Lungs/Pleura: 4 mm subpleural nodule of the anterior left upper lobe (series 4, image 87). No evidence of fibrotic interstitial lung disease. No significant air trapping on expiratory phase imaging. No pleural effusion or pneumothorax.  CXR 12/28/2019 The heart size and mediastinal contours are within normal limits. Both lungs are  clear. Mild thoracic spondylosis. No blunting of the costophrenic angles.  PFT: No flowsheet data found.   Assessment & Plan:   Mild persistent reactive airway disease without complication  Cough - Plan: Pulmonary Function Test  Post-nasal drainage  Discussion: [name] is a 50 year old woman, former smoker with history of GERD, dysphagia, and fibromyalgia who returns to pulmonary clinic for cough.  Her cough was related to ACEi as she had significant improvement in her cough since stopping this medication in July 2022. She continues to have intermittent cough which is related to GERD, intermittent sinus congestion and possible reactive airways disease. She is followed by GI for her GERD and is taking 40mg  prilosec daily and sleeping on a wedge pillow at night. She can continue flonase 2 sprays daily and ipratropium nasal spray as needed for sinus congestion and drainage. She can continue albuterol inhaler as needed for the cough. We will check pulmonary function tests at her next follow up visit.   Follow up in 4 months.   Freda Jackson, MD [facility] Office: [phone]    Current Outpatient Medications:    albuterol (VENTOLIN HFA) 108 (90 Base) MCG/ACT inhaler, Inhale 2 puffs into the lungs in the morning, at noon, in the evening, and at bedtime., Disp: , Rfl:    amLODipine (NORVASC) 5 MG tablet, Take 10 mg by mouth daily., Disp: , Rfl:    amphetamine-dextroamphetamine (ADDERALL) 10 MG tablet, Take 10 mg by mouth daily with breakfast., Disp: , Rfl:    benzonatate (TESSALON) 200 MG capsule, Take 1 capsule (200 mg total) by mouth 3 (three) times daily as needed for cough., Disp: 45 capsule, Rfl: 1   buPROPion (WELLBUTRIN XL) 150 MG 24 hr tablet, Take 150 mg by mouth every morning., Disp: , Rfl:    cyclobenzaprine (FLEXERIL) 10 MG tablet, TAKE 1 TABLET BY MOUTH TWICE A DAY AS NEEDED FOR MUSCLE SPASM, Disp: 30 tablet, Rfl: 0   diclofenac Sodium  (VOLTAREN) 1 % GEL, APPLY 2-4 GRAMS TO AFFECTED JOINT 4 TIMES DAILY AS NEEDED., Disp: 400 g, Rfl: 2   escitalopram (LEXAPRO) 20 MG tablet, Take 20 mg by mouth at bedtime. , Disp: , Rfl:    fluticasone (FLONASE) 50 MCG/ACT nasal spray, Place 1 spray into both nostrils daily. (Patient taking differently: Place 2 sprays into both nostrils daily.), Disp: 16 g, Rfl: 2   gabapentin (NEURONTIN) 300 MG capsule, Take 600 mg by mouth at bedtime. , Disp: , Rfl:    ipratropium (ATROVENT) 0.03 % nasal spray, Place 2 sprays into both nostrils every 12 (twelve) hours., Disp: 30 mL, Rfl: 12   metFORMIN (GLUCOPHAGE-XR) 500 MG 24 hr tablet, Take 500 mg by mouth every evening. , Disp: , Rfl:    metoCLOPramide (REGLAN) 5 MG tablet, Take 1 tablet (5 mg total) by mouth every 8 (eight) hours as needed for nausea., Disp: 60 tablet, Rfl: 2   mupirocin cream (BACTROBAN) 2 %, Apply  1 application topically 2 (two) times daily., Disp: 30 g, Rfl: 0   naproxen (NAPROSYN) 500 MG tablet, Take 500 mg by mouth as needed., Disp: , Rfl:    omeprazole (PRILOSEC) 20 MG capsule, TAKE 2 CAPSULES BY MOUTH EVERY DAY, Disp: 180 capsule, Rfl: 3   ONETOUCH VERIO test strip, 1 each daily., Disp: , Rfl:    OZEMPIC, 0.25 OR 0.5 MG/DOSE, 2 MG/1.5ML SOPN, Inject 0.25 mg into the skin at bedtime. , Disp: , Rfl:    PRAVASTATIN SODIUM PO, Take 1 tablet by mouth daily. , Disp: , Rfl:    traMADol (ULTRAM) 50 MG tablet, Take 50 mg by mouth 2 (two) times daily., Disp: , Rfl:    zolpidem (AMBIEN) 5 MG tablet, TAKE 1 TABLET (5 MG TOTAL) BY MOUTH AT BEDTIME AS NEEDED FOR SLEEP., Disp: 30 tablet, Rfl: 0

## 2021-09-12 NOTE — Patient Instructions (Signed)
Your cough is likely secondary to ongoing reflux and sinus congestion/drainage with possible reactive airways disease.  We will schedule you for pulmonary function tests at your 4 month follow up.  Continue to follow with GI for your reflux.   Continue albuterol as needed for cough.

## 2021-09-15 ENCOUNTER — Encounter: Payer: Self-pay | Admitting: *Deleted

## 2021-10-14 ENCOUNTER — Other Ambulatory Visit: Payer: Self-pay | Admitting: Rheumatology

## 2021-10-16 NOTE — Telephone Encounter (Signed)
Next Visit: 12/29/2021  Last Visit: 06/30/2021  Last Fill: 05/23/2021  Dx: Primary osteoarthritis of both knees  Current Dose per office note on 06/30/2021: not discussed.   Okay to refill Voltaren Gel?

## 2021-10-17 ENCOUNTER — Other Ambulatory Visit: Payer: Self-pay | Admitting: Gastroenterology

## 2021-10-17 ENCOUNTER — Other Ambulatory Visit: Payer: Self-pay | Admitting: Surgical

## 2021-10-20 ENCOUNTER — Other Ambulatory Visit: Payer: Self-pay

## 2021-10-20 NOTE — Telephone Encounter (Signed)
Please advise 

## 2021-10-20 NOTE — Telephone Encounter (Signed)
Need to request from PCP if she still needs this, hasn't been seen in almost 6 months

## 2021-10-25 ENCOUNTER — Ambulatory Visit: Payer: Medicare Other | Admitting: Orthopedic Surgery

## 2021-10-28 ENCOUNTER — Other Ambulatory Visit: Payer: Self-pay | Admitting: Pulmonary Disease

## 2021-10-28 DIAGNOSIS — J3489 Other specified disorders of nose and nasal sinuses: Secondary | ICD-10-CM

## 2021-10-30 NOTE — Telephone Encounter (Signed)
Received a refill request for bactroban 2% ointment. RX was last prescribed on 05/09/21 for a nasal sore.   [name], are you ok with this refill?

## 2021-11-06 ENCOUNTER — Telehealth: Payer: Self-pay | Admitting: Pulmonary Disease

## 2021-11-06 NOTE — Telephone Encounter (Signed)
Spoke with pt. Upon review of record I did not see any notes where we called her. Pt sated understanding. Nothing further needed at this time.

## 2021-11-06 NOTE — Telephone Encounter (Signed)
ATC LVMTCB X1  

## 2021-12-15 NOTE — Progress Notes (Deleted)
Office Visit Note  Patient: [name]             Date of Birth: 1971-04-25           MRN: [id number]             PCP: Donald Prose, MD Referring: Donald Prose, MD Visit Date: 12/29/2021 Occupation: @GUAROCC @  Subjective:    History of Present Illness: [name] is a 50 y.o. female with history of fibromyalgia and osteoarthritis.   Activities of Daily Living:  Patient reports morning stiffness for *** {minute/hour:19697}.   Patient {ACTIONS;DENIES/REPORTS:21021675::"Denies"} nocturnal pain.  Difficulty dressing/grooming: {ACTIONS;DENIES/REPORTS:21021675::"Denies"} Difficulty climbing stairs: {ACTIONS;DENIES/REPORTS:21021675::"Denies"} Difficulty getting out of chair: {ACTIONS;DENIES/REPORTS:21021675::"Denies"} Difficulty using hands for taps, buttons, cutlery, and/or writing: {ACTIONS;DENIES/REPORTS:21021675::"Denies"}  No Rheumatology ROS completed.   PMFS History:  Patient Active Problem List   Diagnosis Date Noted   Fibromyalgia 12/25/2016   Other fatigue 12/25/2016   Primary insomnia 12/25/2016   Primary osteoarthritis of both knees 12/25/2016   ANA positive 12/25/2016   History of gastroesophageal reflux (GERD) 12/25/2016   History of anemia 12/25/2016   Trochanteric bursitis of both hips 12/25/2016   Anemia, iron deficiency 04/18/2015   Dysphagia, pharyngoesophageal phase 11/14/2011   Esophageal reflux 11/14/2011    Past Medical History:  Diagnosis Date   ADD (attention deficit disorder with hyperactivity)    Allergic rhinitis    Anxiety    Arthritis    Depression    Dysmenorrhea    Fibromyalgia    GERD (gastroesophageal reflux disease)    H. pylori infection    per patient    HLD (hyperlipidemia)    HTN (hypertension)    Insomnia    Irritable bowel syndrome    Restless leg syndrome    Sleep apnea     Family History  Problem Relation Age of Onset   Breast cancer Maternal Grandmother    Diabetes  Sister    Irritable bowel syndrome Sister    Heart disease Brother    Heart attack Brother    Stroke Mother    Heart disease Mother    Diabetes Sister    Irritable bowel syndrome Sister    Fibromyalgia Daughter    Colon cancer Neg Hx    Colon polyps Neg Hx    Esophageal cancer Neg Hx    Kidney disease Neg Hx    Gallbladder disease Neg Hx    Stomach cancer Neg Hx    Past Surgical History:  Procedure Laterality Date   BUNIONECTOMY Left 2008   DILATION AND CURETTAGE OF UTERUS  2004   EAR CYST EXCISION Left 1983   KNEE ARTHROSCOPY Right    with knee cap adjustment   UPPER GASTROINTESTINAL ENDOSCOPY  06/29/2020   Social History   Social History Narrative   Not on file   Immunization History  Administered Date(s) Administered   PFIZER(Purple Top)SARS-COV-2 Vaccination 03/11/2020, 04/01/2020, 12/08/2020     Objective: Vital Signs: There were no vitals taken for this visit.   Physical Exam Vitals and nursing note reviewed.  Constitutional:      Appearance: She is well-developed.  HENT:     Head: Normocephalic and atraumatic.  Eyes:     Conjunctiva/sclera: Conjunctivae normal.  Pulmonary:     Effort: Pulmonary effort is normal.  Abdominal:     Palpations: Abdomen is soft.  Musculoskeletal:     Cervical back: Normal range of motion.  Skin:    General: Skin is warm and dry.  Capillary Refill: Capillary refill takes less than 2 seconds.  Neurological:     Mental Status: She is alert and oriented to person, place, and time.  Psychiatric:        Behavior: Behavior normal.     Musculoskeletal Exam: ***  CDAI Exam: CDAI Score: -- Patient Global: --; Provider Global: -- Swollen: --; Tender: -- Joint Exam 12/29/2021   No joint exam has been documented for this visit   There is currently no information documented on the homunculus. Go to the Rheumatology activity and complete the homunculus joint exam.  Investigation: No additional  findings.  Imaging: No results found.  Recent Labs: Lab Results  Component Value Date   WBC 10.1 06/29/2020   HGB 12.7 06/29/2020   PLT 409.0 (H) 06/29/2020   NA 137 06/29/2020   K 3.3 (L) 06/29/2020   CL 101 06/29/2020   CO2 28 06/29/2020   GLUCOSE 191 (H) 06/29/2020   BUN 8 06/29/2020   CREATININE 0.78 06/29/2020   BILITOT 0.3 06/29/2020   ALKPHOS 114 06/29/2020   AST 12 06/29/2020   ALT 14 06/29/2020   PROT 7.8 06/29/2020   ALBUMIN 4.1 06/29/2020   CALCIUM 9.2 06/29/2020   GFRAA >60 12/28/2019    Speciality Comments: No specialty comments available.  Procedures:  No procedures performed Allergies: Patient has no known allergies.   Assessment / Plan:     Visit Diagnoses: No diagnosis found.  Orders: No orders of the defined types were placed in this encounter.  No orders of the defined types were placed in this encounter.   Face-to-face time spent with patient was *** minutes. Greater than 50% of time was spent in counseling and coordination of care.  Follow-Up Instructions: No follow-ups on file.   Earnestine Mealing, CMA  Note - This record has been created using Editor, commissioning.  Chart creation errors have been sought, but may not always  have been located. Such creation errors do not reflect on  the standard of medical care.

## 2021-12-28 DIAGNOSIS — R69 Illness, unspecified: Secondary | ICD-10-CM | POA: Diagnosis not present

## 2021-12-29 ENCOUNTER — Ambulatory Visit: Payer: Medicare HMO | Admitting: Physician Assistant

## 2022-01-05 NOTE — Progress Notes (Signed)
Office Visit Note  Patient: [name]             Date of Birth: 05-09-1971           MRN: [id number]             PCP: Donald Prose, MD Referring: Donald Prose, MD Visit Date: 01/08/2022 Occupation: @GUAROCC @  Subjective:  Recent fall   History of Present Illness: [name] is a 51 y.o. female with history of fibromyalgia and osteoarthritis.    Fell in December  No follow up with Dr. Durward Fortes Fibromyalgia  Spasms Bursitis   Naproxen PRN  Tramadol  Ambien 5 mg at bedtime  Gabapentin 300 mg 2 capsules at bedtime   Referral to vascular   Gagapentin   Activities of Daily Living:  Patient reports morning stiffness for all day. Patient Reports nocturnal pain.  Difficulty dressing/grooming: Denies Difficulty climbing stairs: Reports Difficulty getting out of chair: Reports Difficulty using hands for taps, buttons, cutlery, and/or writing: Reports  Review of Systems  Constitutional:  Positive for fatigue.  HENT:  Positive for mouth dryness and nose dryness. Negative for mouth sores.        Nose and ear sores  Eyes:  Negative for pain, itching and dryness.  Respiratory:  Negative for shortness of breath and difficulty breathing.   Cardiovascular:  Negative for chest pain and palpitations.  Gastrointestinal:  Negative for blood in stool, constipation and diarrhea.  Endocrine: Negative for increased urination.  Genitourinary:  Negative for difficulty urinating.  Musculoskeletal:  Positive for joint pain, joint pain, myalgias, morning stiffness, muscle tenderness and myalgias. Negative for joint swelling.  Skin:  Negative for color change, rash and redness.  Allergic/Immunologic: Negative for susceptible to infections.  Neurological:  Positive for dizziness, numbness, headaches, memory loss and weakness.  Hematological:  Negative for bruising/bleeding tendency.  Psychiatric/Behavioral:  Positive for confusion.    PMFS History:  Patient Active  Problem List   Diagnosis Date Noted   Fibromyalgia 12/25/2016   Other fatigue 12/25/2016   Primary insomnia 12/25/2016   Primary osteoarthritis of both knees 12/25/2016   ANA positive 12/25/2016   History of gastroesophageal reflux (GERD) 12/25/2016   History of anemia 12/25/2016   Trochanteric bursitis of both hips 12/25/2016   Anemia, iron deficiency 04/18/2015   Dysphagia, pharyngoesophageal phase 11/14/2011   Esophageal reflux 11/14/2011    Past Medical History:  Diagnosis Date   ADD (attention deficit disorder with hyperactivity)    Allergic rhinitis    Anxiety    Arthritis    Depression    Dysmenorrhea    Fibromyalgia    GERD (gastroesophageal reflux disease)    H. pylori infection    per patient    HLD (hyperlipidemia)    HTN (hypertension)    Insomnia    Irritable bowel syndrome    Restless leg syndrome    Sleep apnea     Family History  Problem Relation Age of Onset   Breast cancer Maternal Grandmother    Diabetes Sister    Irritable bowel syndrome Sister    Heart disease Brother    Heart attack Brother    Stroke Mother    Heart disease Mother    Diabetes Sister    Irritable bowel syndrome Sister    Fibromyalgia Daughter    Colon cancer Neg Hx    Colon polyps Neg Hx    Esophageal cancer Neg Hx    Kidney disease Neg Hx    Gallbladder  disease Neg Hx    Stomach cancer Neg Hx    Past Surgical History:  Procedure Laterality Date   BUNIONECTOMY Left 2008   DILATION AND CURETTAGE OF UTERUS  2004   EAR CYST EXCISION Left 1983   KNEE ARTHROSCOPY Right    with knee cap adjustment   UPPER GASTROINTESTINAL ENDOSCOPY  06/29/2020   Social History   Social History Narrative   Not on file   Immunization History  Administered Date(s) Administered   PFIZER(Purple Top)SARS-COV-2 Vaccination 03/11/2020, 04/01/2020, 12/08/2020     Objective: Vital Signs: BP (!) 140/93 (BP Location: Left Arm, Patient Position: Sitting, Cuff Size: Normal)    Pulse 96    Ht 5'  6" (1.676 m)    Wt 216 lb (98 kg)    BMI 34.86 kg/m    Physical Exam Vitals and nursing note reviewed.  Constitutional:      Appearance: She is well-developed.  HENT:     Head: Normocephalic and atraumatic.  Eyes:     Conjunctiva/sclera: Conjunctivae normal.  Pulmonary:     Effort: Pulmonary effort is normal.  Abdominal:     Palpations: Abdomen is soft.  Musculoskeletal:     Cervical back: Normal range of motion.  Skin:    General: Skin is warm and dry.     Capillary Refill: Capillary refill takes less than 2 seconds.  Neurological:     Mental Status: She is alert and oriented to person, place, and time.  Psychiatric:        Behavior: Behavior normal.     Musculoskeletal Exam: Generalized hyperalgesia and positive tender points on examination. C-spine has limited ROM with lateral rotation.  Trapezius muscle tension and tenderness bilaterally.  Shoulder joints, elbow joints, wrist joints, MCPs, PIPs, and DIPs good ROM with no synovitis.  Complete fist formation bilaterally.  Hip joints have good ROM.  Tenderness over both trochanteric bursa.  Knee joints have good ROM with no warmth or effusion.  Ankle joints have good ROM with no tenderness or joint swelling.   CDAI Exam: CDAI Score: -- Patient Global: --; Provider Global: -- Swollen: --; Tender: -- Joint Exam 01/08/2022   No joint exam has been documented for this visit   There is currently no information documented on the homunculus. Go to the Rheumatology activity and complete the homunculus joint exam.  Investigation: No additional findings.  Imaging: No results found.  Recent Labs: Lab Results  Component Value Date   WBC 10.1 06/29/2020   HGB 12.7 06/29/2020   PLT 409.0 (H) 06/29/2020   NA 137 06/29/2020   K 3.3 (L) 06/29/2020   CL 101 06/29/2020   CO2 28 06/29/2020   GLUCOSE 191 (H) 06/29/2020   BUN 8 06/29/2020   CREATININE 0.78 06/29/2020   BILITOT 0.3 06/29/2020   ALKPHOS 114 06/29/2020   AST 12  06/29/2020   ALT 14 06/29/2020   PROT 7.8 06/29/2020   ALBUMIN 4.1 06/29/2020   CALCIUM 9.2 06/29/2020   GFRAA >60 12/28/2019    Speciality Comments: No specialty comments available.  Procedures:  No procedures performed Allergies: Lisinopril   Assessment / Plan:     Visit Diagnoses: Fibromyalgia: She has generalized hyperalgesia and positive tender points on examination.  She is currently having a fibromyalgia flare.  She has been experiencing increased myalgias and muscle tenderness for the past 3 weeks.  She had difficulty sleeping at night due to nocturnal pain.  She remains on Ambien 5 mg at bedtime and gabapentin 600  mg at bedtime.  She plans on following up with her PCP to further discuss increasing the dose of gabapentin since it does not seem to be as effective as it was in the past.  She has also been experiencing increased fatigue secondary to insomnia.  Discussed the importance of regular exercise.  She would benefit from water aerobics or water therapy.  She feels that a combination of things have led to her current flare.  She fell in mid December 2022 and continues to have discomfort in her right leg.  She has also noticed increased arthralgias and joint stiffness with colder weather temperatures.  She feels that her body has been under a lot of stress recently. She requested a muscle relaxer to help with the muscle spasms she has been experiencing.  A prescription for methocarbamol 500 mg 1 tablet twice daily as needed for muscle spasms was sent to the pharmacy.  She can continue to use a heating pad or heated blanket. She was advised to notify us if her symptoms persist or worsen. She will follow up in 6 months.   Chronic right shoulder pain: She has good ROM of the right knee joint on examination today.  She had a right glenohumeral cortisone injection on 04/10/21, which provided significant relief.   Trochanteric bursitis of both hips: She has tenderness palpation over  bilateral trochanteric bursa.  Discussed the importance performing stretching exercises on a daily basis.  She was advised to notify us if her symptoms persist or worsen.  Primary osteoarthritis of both knees: She has good range of motion of both knee joints on examination today.  No warmth or effusion was noted.  She has had some increased discomfort in her right knee after a fall in mid December 2022.  She previously had right knee arthroscopic surgery performed by Dr. Durward Fortes.  She has not followed up with Dr. Durward Fortes yet for further evaluation. She is not experiencing any mechanical symptoms at this time and did not have any inflammation on examination. She has been concerned about a circulation issue in her right leg.  No calf tightness or tenderness was noted.  No skin discoloration noted.  Very mild pitting edema noted.  She was evaluated by her PCP who recommended wearing compression stockings.  She has not been wearing the stockings on a regular basis.  She requested a referral to vascular surgery for further evaluation.  Other fatigue: She has been experiencing increased fatigue over the past several weeks.  Discussed the importance of remaining active and exercising on a daily basis.  Primary insomnia: She takes Ambien 5 mg at bedtime as needed for insomnia.  Discussed the importance of good sleep hygiene.  ANA positive: She has no clinical features of systemic lupus at this time.  Vitamin D deficiency  Other medical conditions are listed as follows:   History of gastroesophageal reflux (GERD)  History of diabetes mellitus  History of anemia  Localized edema -Patient requested a referral to vascular for further evaluation.   Plan: Ambulatory referral to Vascular Surgery  Orders: Orders Placed This Encounter  Procedures   Ambulatory referral to Vascular Surgery   Meds ordered this encounter  Medications   methocarbamol (ROBAXIN) 500 MG tablet    Sig: Take 1 tablet (500  mg total) by mouth daily as needed for muscle spasms.    Dispense:  30 tablet    Refill:  0     Follow-Up Instructions: Return in about 3 months (around 04/08/2022) for  Fibromyalgia, Osteoarthritis.   Ofilia Neas, PA-C  Note - This record has been created using Dragon software.  Chart creation errors have been sought, but may not always  have been located. Such creation errors do not reflect on  the standard of medical care.

## 2022-01-08 ENCOUNTER — Ambulatory Visit: Payer: Medicare HMO | Admitting: Physician Assistant

## 2022-01-08 ENCOUNTER — Encounter: Payer: Self-pay | Admitting: Physician Assistant

## 2022-01-08 ENCOUNTER — Other Ambulatory Visit: Payer: Self-pay

## 2022-01-08 VITALS — BP 140/93 | HR 96 | Ht 66.0 in | Wt 216.0 lb

## 2022-01-08 DIAGNOSIS — Z8639 Personal history of other endocrine, nutritional and metabolic disease: Secondary | ICD-10-CM

## 2022-01-08 DIAGNOSIS — E559 Vitamin D deficiency, unspecified: Secondary | ICD-10-CM

## 2022-01-08 DIAGNOSIS — F5101 Primary insomnia: Secondary | ICD-10-CM | POA: Diagnosis not present

## 2022-01-08 DIAGNOSIS — R768 Other specified abnormal immunological findings in serum: Secondary | ICD-10-CM

## 2022-01-08 DIAGNOSIS — Z862 Personal history of diseases of the blood and blood-forming organs and certain disorders involving the immune mechanism: Secondary | ICD-10-CM

## 2022-01-08 DIAGNOSIS — R5383 Other fatigue: Secondary | ICD-10-CM

## 2022-01-08 DIAGNOSIS — Z8719 Personal history of other diseases of the digestive system: Secondary | ICD-10-CM

## 2022-01-08 DIAGNOSIS — R600 Localized edema: Secondary | ICD-10-CM

## 2022-01-08 DIAGNOSIS — M797 Fibromyalgia: Secondary | ICD-10-CM

## 2022-01-08 DIAGNOSIS — M7062 Trochanteric bursitis, left hip: Secondary | ICD-10-CM

## 2022-01-08 DIAGNOSIS — M25511 Pain in right shoulder: Secondary | ICD-10-CM | POA: Diagnosis not present

## 2022-01-08 DIAGNOSIS — M170 Bilateral primary osteoarthritis of knee: Secondary | ICD-10-CM | POA: Diagnosis not present

## 2022-01-08 DIAGNOSIS — M7061 Trochanteric bursitis, right hip: Secondary | ICD-10-CM

## 2022-01-08 DIAGNOSIS — G8929 Other chronic pain: Secondary | ICD-10-CM

## 2022-01-08 MED ORDER — METHOCARBAMOL 500 MG PO TABS: 500.0000 mg | ORAL_TABLET | Freq: Every day | ORAL | 0 refills | Status: DC | PRN

## 2022-01-08 NOTE — Patient Instructions (Signed)
Hip Bursitis Rehab Ask your health care provider which exercises are safe for you. Do exercises exactly as told by your health care provider and adjust them as directed. It is normal to feel mild stretching, pulling, tightness, or discomfort as you do these exercises. Stop right away if you feel sudden pain or your pain gets worse. Do not begin these exercises until told by your health care provider. Stretching exercise This exercise warms up your muscles and joints and improves the movement and flexibility of your hip. This exercise also helps to relieve pain and stiffness. Iliotibial band stretch An iliotibial band is a strong band of muscle tissue that runs from the outer side of your hip to the outer side of your thigh and knee. Lie on your side with your left / right leg in the top position. Bend your left / right knee and grab your ankle. Stretch out your bottom arm to help you balance. Slowly bring your knee back so your thigh is behind your body. Slowly lower your knee toward the floor until you feel a gentle stretch on the outside of your left / right thigh. If you do not feel a stretch and your knee will not fall farther, place the heel of your other foot on top of your knee and pull your knee down toward the floor with your foot. Hold this position for __________ seconds. Slowly return to the starting position. Repeat __________ times. Complete this exercise __________ times a day. Strengthening exercises These exercises build strength and endurance in your hip and pelvis. Endurance is the ability to use your muscles for a long time, even after they get tired. Bridge This exercise strengthens the muscles that move your thigh backward (hip extensors). Lie on your back on a firm surface with your knees bent and your feet flat on the floor. Tighten your buttocks muscles and lift your buttocks off the floor until your trunk is level with your thighs. Do not arch your back. You should feel  the muscles working in your buttocks and the back of your thighs. If you do not feel these muscles, slide your feet 1-2 inches (2.5-5 cm) farther away from your buttocks. If this exercise is too easy, try doing it with your arms crossed over your chest. Hold this position for __________ seconds. Slowly lower your hips to the starting position. Let your muscles relax completely after each repetition. Repeat __________ times. Complete this exercise __________ times a day. Squats This exercise strengthens the muscles in front of your thigh and knee (quadriceps). Stand in front of a table, with your feet and knees pointing straight ahead. You may rest your hands on the table for balance but not for support. Slowly bend your knees and lower your hips like you are going to sit in a chair. Keep your weight over your heels, not over your toes. Keep your lower legs upright so they are parallel with the table legs. Do not let your hips go lower than your knees. Do not bend lower than told by your health care provider. If your hip pain increases, do not bend as low. Hold the squat position for __________ seconds. Slowly push with your legs to return to standing. Do not use your hands to pull yourself to standing. Repeat __________ times. Complete this exercise __________ times a day. Hip hike Stand sideways on a bottom step. Stand on your left / right leg with your other foot unsupported next to the step. You can hold  on to the railing or wall for balance if needed. Keep your knees straight and your torso square. Then lift your left / right hip up toward the ceiling. Hold this position for __________ seconds. Slowly let your left / right hip lower toward the floor, past the starting position. Your foot should get closer to the floor. Do not lean or bend your knees. Repeat __________ times. Complete this exercise __________ times a day. Single leg stand Without shoes, stand near a railing or in a  doorway. You may hold on to the railing or door frame as needed for balance. Squeeze your left / right buttock muscles, then lift up your other foot. Do not let your left / right hip push out to the side. It is helpful to stand in front of a mirror for this exercise so you can watch your hip. Hold this position for __________ seconds. Repeat __________ times. Complete this exercise __________ times a day. This information is not intended to replace advice given to you by your health care provider. Make sure you discuss any questions you have with your health care provider. Iliotibial Band Syndrome Rehab Ask your health care provider which exercises are safe for you. Do exercises exactly as told by your health care provider and adjust them as directed. It is normal to feel mild stretching, pulling, tightness, or discomfort as you do these exercises. Stop right away if you feel sudden pain or your pain gets significantly worse. Do not begin these exercises until told by your health care provider. Stretching and range-of-motion exercises These exercises warm up your muscles and joints and improve the movement and flexibility of your hip and pelvis. Quadriceps stretch, prone  Lie on your abdomen (prone position) on a firm surface, such as a bed or padded floor. Bend your left / right knee and reach back to hold your ankle or pant leg. If you cannot reach your ankle or pant leg, loop a belt around your foot and grab the belt instead. Gently pull your heel toward your buttocks. Your knee should not slide out to the side. You should feel a stretch in the front of your thigh and knee (quadriceps). Hold this position for __________ seconds. Repeat __________ times. Complete this exercise __________ times a day. Iliotibial band stretch An iliotibial band is a strong band of muscle tissue that runs from the outer side of your hip to the outer side of your thigh and knee. Lie on your side with your left /  right leg in the top position. Bend both of your knees and grab your left / right ankle. Stretch out your bottom arm to help you balance. Slowly bring your top knee back so your thigh goes behind your trunk. Slowly lower your top leg toward the floor until you feel a gentle stretch on the outside of your left / right hip and thigh. If you do not feel a stretch and your knee will not fall farther, place the heel of your other foot on top of your knee and pull your knee down toward the floor with your foot. Hold this position for __________ seconds. Repeat __________ times. Complete this exercise __________ times a day. Strengthening exercises These exercises build strength and endurance in your hip and pelvis. Endurance is the ability to use your muscles for a long time, even after they get tired. Straight leg raises, side-lying This exercise strengthens the muscles that rotate the leg at the hip and move it away from  your body (hip abductors). Lie on your side with your left / right leg in the top position. Lie so your head, shoulder, hip, and knee line up. You may bend your bottom knee to help you balance. Roll your hips slightly forward so your hips are stacked directly over each other and your left / right knee is facing forward. Tense the muscles in your outer thigh and lift your top leg 4-6 inches (10-15 cm). Hold this position for __________ seconds. Slowly lower your leg to return to the starting position. Let your muscles relax completely before doing another repetition. Repeat __________ times. Complete this exercise __________ times a day. Leg raises, prone This exercise strengthens the muscles that move the hips backward (hip extensors). Lie on your abdomen (prone position) on your bed or a firm surface. You can put a pillow under your hips if that is more comfortable for your lower back. Bend your left / right knee so your foot is straight up in the air. Squeeze your buttocks muscles  and lift your left / right thigh off the bed. Do not let your back arch. Tense your thigh muscle as hard as you can without increasing any knee pain. Hold this position for __________ seconds. Slowly lower your leg to return to the starting position and allow it to relax completely. Repeat __________ times. Complete this exercise __________ times a day. Hip hike Stand sideways on a bottom step. Stand on your left / right leg with your other foot unsupported next to the step. You can hold on to a railing or wall for balance if needed. Keep your knees straight and your torso square. Then lift your left / right hip up toward the ceiling. Slowly let your left / right hip lower toward the floor, past the starting position. Your foot should get closer to the floor. Do not lean or bend your knees. Repeat __________ times. Complete this exercise __________ times a day. This information is not intended to replace advice given to you by your health care provider. Make sure you discuss any questions you have with your health care provider. Document Revised: 02/10/2020 Document Reviewed: 02/10/2020 [employer] Patient Education  2022 [place] Revised: 03/30/2019 Document Reviewed: 03/30/2019 [employer] Patient Education  2022 [employer].

## 2022-01-16 DIAGNOSIS — R69 Illness, unspecified: Secondary | ICD-10-CM | POA: Diagnosis not present

## 2022-01-22 ENCOUNTER — Other Ambulatory Visit: Payer: Self-pay

## 2022-01-22 DIAGNOSIS — I739 Peripheral vascular disease, unspecified: Secondary | ICD-10-CM

## 2022-01-22 NOTE — Progress Notes (Signed)
[facility] [place]  ASSESSMENT / PLAN: 51 y.o. female with bilateral lower extremity numbness, paresthesias, and pain. This occurs both with ambulation and at rest. Her clinical picture is most consistent with diabetic neuropathy. She has a normal peripheral vascular exam. She has a normal ankle-brachial index. Her toe pressure is mildly depressed, consistent with diabetic microvascular disease. I encouraged her to continue working on good control of diabetes, daily exercise of thirty minutes or more, and regular diabetic foot exams by a podiatrist or her primary care physician. She can follow up with me as needed.  CHIEF COMPLAINT: bilateral leg pain  HISTORY OF PRESENT ILLNESS: [name] is a 51 y.o. female with bilateral lower extremity numbness, paresthesias and pain.  She has an established diagnosis of restless leg syndrome.  She takes gabapentin for this.  She is a type II diabetic.  She has fairly good control of her diabetes.  She does not have symptoms typical of intermittent claudication, ischemic rest pain.  She has no ulcers.  Past Medical History:  Diagnosis Date   ADD (attention deficit disorder with hyperactivity)    Allergic rhinitis    Anxiety    Arthritis    Depression    Dysmenorrhea    Fibromyalgia    GERD (gastroesophageal reflux disease)    H. pylori infection    per patient    HLD (hyperlipidemia)    HTN (hypertension)    Insomnia    Irritable bowel syndrome    Restless leg syndrome    Sleep apnea     Past Surgical History:  Procedure Laterality Date   BUNIONECTOMY Left 2008   DILATION AND CURETTAGE OF UTERUS  2004   EAR CYST EXCISION Left 1983   KNEE ARTHROSCOPY Right    with knee cap adjustment   UPPER GASTROINTESTINAL ENDOSCOPY  06/29/2020    Family History  Problem Relation Age of Onset   Breast cancer Maternal Grandmother    Diabetes Sister    Irritable bowel syndrome Sister    Heart disease Brother     Heart attack Brother    Stroke Mother    Heart disease Mother    Diabetes Sister    Irritable bowel syndrome Sister    Fibromyalgia Daughter    Colon cancer Neg Hx    Colon polyps Neg Hx    Esophageal cancer Neg Hx    Kidney disease Neg Hx    Gallbladder disease Neg Hx    Stomach cancer Neg Hx     Social History   Socioeconomic History   Marital status: Widowed    Spouse name: Not on file   Number of children: 1   Years of education: Not on file   Highest education level: Not on file  Occupational History   Occupation: Disabled  Tobacco Use   Smoking status: Former    Packs/day: 0.10    Years: 12.00    Pack years: 1.20    Types: Cigarettes    Quit date: 12/27/1993    Years since quitting: 28.0   Smokeless tobacco: Never  Vaping Use   Vaping Use: Never used  Substance and Sexual Activity   Alcohol use: No   Drug use: No   Sexual activity: Yes  Other Topics Concern   Not on file  Social History Narrative   Not on file   Social Determinants of Health   Financial Resource Strain: Not on file  Food Insecurity: Not on file  Transportation Needs: Not  on file  Physical Activity: Not on file  Stress: Not on file  Social Connections: Not on file  Intimate Partner Violence: Not on file    Allergies  Allergen Reactions   Lisinopril     Cough    Prednisone     Other reaction(s): rapid heartbeat   Ritalin [Methylphenidate]     Other reaction(s): palpitations    Current Outpatient Medications  Medication Sig Dispense Refill   amLODipine (NORVASC) 10 MG tablet Take 10 mg by mouth daily.     albuterol (VENTOLIN HFA) 108 (90 Base) MCG/ACT inhaler Inhale 2 puffs into the lungs in the morning, at noon, in the evening, and at bedtime.     amoxicillin (AMOXIL) 500 MG capsule Take by mouth.     amphetamine-dextroamphetamine (ADDERALL) 10 MG tablet Take 10 mg by mouth daily with breakfast.     benzonatate (TESSALON) 200 MG capsule Take 1 capsule (200 mg total) by mouth 3  (three) times daily as needed for cough. 45 capsule 1   buPROPion (WELLBUTRIN XL) 150 MG 24 hr tablet Take 150 mg by mouth every morning.     cyclobenzaprine (FLEXERIL) 10 MG tablet TAKE 1 TABLET BY MOUTH TWICE A DAY AS NEEDED FOR MUSCLE SPASM (Patient not taking: Reported on 01/08/2022) 30 tablet 0   diclofenac Sodium (VOLTAREN) 1 % GEL APPLY 2-4 GRAMS TO AFFECTED JOINT 4 TIMES DAILY AS NEEDED. 400 g 2   escitalopram (LEXAPRO) 20 MG tablet Take 20 mg by mouth at bedtime.      gabapentin (NEURONTIN) 300 MG capsule Take 600 mg by mouth at bedtime.      gabapentin (NEURONTIN) 300 MG capsule Take 2 capsules by mouth at bedtime.     HYDROcodone-acetaminophen (NORCO/VICODIN) 5-325 MG tablet Take by mouth.     metFORMIN (GLUCOPHAGE-XR) 500 MG 24 hr tablet Take 500 mg by mouth every evening.      metFORMIN (GLUCOPHAGE-XR) 500 MG 24 hr tablet Take 2 tablets by mouth 2 (two) times daily.     methocarbamol (ROBAXIN) 500 MG tablet Take 1 tablet (500 mg total) by mouth daily as needed for muscle spasms. 30 tablet 0   metoCLOPramide (REGLAN) 5 MG tablet TAKE 1 TABLET (5 MG TOTAL) BY MOUTH EVERY 8 (EIGHT) HOURS AS NEEDED FOR NAUSEA. (Patient not taking: Reported on 01/08/2022) 60 tablet 2   naproxen (NAPROSYN) 500 MG tablet Take 500 mg by mouth as needed.     omeprazole (PRILOSEC) 20 MG capsule TAKE 2 CAPSULES BY MOUTH EVERY DAY 180 capsule 3   ONETOUCH VERIO test strip 1 each daily.     OZEMPIC, 0.25 OR 0.5 MG/DOSE, 2 MG/1.5ML SOPN Inject 0.25 mg into the skin at bedtime.      PRAVASTATIN SODIUM PO Take 1 tablet by mouth daily.      traMADol (ULTRAM) 50 MG tablet Take 50 mg by mouth 2 (two) times daily.     zolpidem (AMBIEN) 5 MG tablet TAKE 1 TABLET (5 MG TOTAL) BY MOUTH AT BEDTIME AS NEEDED FOR SLEEP. 30 tablet 0   No current facility-administered medications for this visit.    REVIEW OF SYSTEMS:  [X]  denotes positive finding, [ ]  denotes negative finding Cardiac  Comments:  Chest pain or chest  pressure:    Shortness of breath upon exertion:    Short of breath when lying flat:    Irregular heart rhythm:        Vascular    Pain in calf, thigh, or hip brought on  by ambulation:    Pain in feet at night that wakes you up from your sleep:     Blood clot in your veins:    Leg swelling:         Pulmonary    Oxygen at home:    Productive cough:     Wheezing:         Neurologic    Sudden weakness in arms or legs:     Sudden numbness in arms or legs:     Sudden onset of difficulty speaking or slurred speech:    Temporary loss of vision in one eye:     Problems with dizziness:         Gastrointestinal    Blood in stool:     Vomited blood:         Genitourinary    Burning when urinating:     Blood in urine:        Psychiatric    Major depression:         Hematologic    Bleeding problems:    Problems with blood clotting too easily:        Skin    Rashes or ulcers:        Constitutional    Fever or chills:      PHYSICAL EXAM Vitals:   01/23/22 0902  BP: (!) 133/92  Pulse: 100  Resp: 20  Temp: 98.2 F (36.8 C)  SpO2: 98%  Weight: 216 lb (98 kg)  Height: 5\' 6"  (1.676 m)   Constitutional: well appearing in no distress. Appears well nourished.  Neurologic: CN intact. No focal findings. Subjective sensory loss over calves and feet in a "stocking" distribution. Psychiatric: Mood and affect symmetric and appropriate. Eyes: No icterus. No conjunctival pallor. Ears, nose, throat: mucous membranes moist. Midline trachea.  Cardiac: regular rate and rhythm.  Respiratory: unlabored. Abdominal: soft, non-tender, non-distended.  Peripheral vascular:  2+ DP pulse bilaterally Extremity: No edema. No cyanosis. No pallor.  Skin: No gangrene. No ulceration.  Lymphatic: No Stemmer's sign. No palpable lymphadenopathy.   PERTINENT LABORATORY AND RADIOLOGIC DATA  Most recent CBC CBC Latest Ref Rng & Units 06/29/2020 12/28/2019 12/28/2019  WBC 4.0 - 10.5 K/uL 10.1 - 8.4   Hemoglobin 12.0 - 15.0 g/dL 12.7 13.3 13.1  Hematocrit 36.0 - 46.0 % 39.4 39.0 43.7  Platelets 150.0 - 400.0 K/uL 409.0(H) - 368     Most recent CMP CMP Latest Ref Rng & Units 06/29/2020 12/28/2019 12/28/2019  Glucose 70 - 99 mg/dL 191(H) 519(HH) 593(HH)  BUN 6 - 23 mg/dL 8 9 11   Creatinine 0.40 - 1.20 mg/dL 0.78 0.60 0.89  Sodium 135 - 145 mEq/L 137 137 132(L)  Potassium 3.5 - 5.1 mEq/L 3.3(L) 3.9 3.7  Chloride 96 - 112 mEq/L 101 100 97(L)  CO2 19 - 32 mEq/L 28 - 24  Calcium 8.4 - 10.5 mg/dL 9.2 - 9.0  Total Protein 6.0 - 8.3 g/dL 7.8 - 8.7(H)  Total Bilirubin 0.2 - 1.2 mg/dL 0.3 - 0.5  Alkaline Phos 39 - 117 U/L 114 - 173(H)  AST 0 - 37 U/L 12 - 24  ALT 0 - 35 U/L 14 - 41    +-------+-----------+-----------+------------+------------+   ABI/TBI Today's ABI Today's TBI Previous ABI Previous TBI   +-------+-----------+-----------+------------+------------+   Right   1.12        0.67                                    +-------+-----------+-----------+------------+------------+  Left    1.09        0.66                                    +-------+-----------+-----------+------------+------------+   Yevonne Aline. Stanford Breed, MD [facility] Phone Number: [phone] 01/23/2022 11:49 AM  Total time spent on preparing this encounter including chart review, data review, collecting history, examining the patient, coordinating care for this new patient, 45 minutes.  Portions of this report may have been transcribed using voice recognition software.  Every effort has been made to ensure accuracy; however, inadvertent computerized transcription errors may still be present.

## 2022-01-23 ENCOUNTER — Ambulatory Visit (HOSPITAL_COMMUNITY)
Admission: RE | Admit: 2022-01-23 | Discharge: 2022-01-23 | Disposition: A | Discharge status: Home / Self Care | Payer: Medicare HMO | Source: Ambulatory Visit | Attending: Vascular Surgery | Admitting: Vascular Surgery

## 2022-01-23 ENCOUNTER — Other Ambulatory Visit: Payer: Self-pay

## 2022-01-23 ENCOUNTER — Ambulatory Visit: Payer: Medicare HMO | Admitting: Vascular Surgery

## 2022-01-23 ENCOUNTER — Encounter: Payer: Self-pay | Admitting: Vascular Surgery

## 2022-01-23 VITALS — BP 133/92 | HR 100 | Temp 98.2°F | Resp 20 | Ht 66.0 in | Wt 216.0 lb

## 2022-01-23 DIAGNOSIS — I739 Peripheral vascular disease, unspecified: Secondary | ICD-10-CM | POA: Insufficient documentation

## 2022-01-23 DIAGNOSIS — M79604 Pain in right leg: Secondary | ICD-10-CM

## 2022-01-23 DIAGNOSIS — M79605 Pain in left leg: Secondary | ICD-10-CM

## 2022-01-29 ENCOUNTER — Other Ambulatory Visit: Payer: Self-pay | Admitting: Physician Assistant

## 2022-01-29 NOTE — Telephone Encounter (Signed)
Next Visit: 04/12/2022  Last Visit: 01/08/2022  Last Fill: 10/16/2021  Dx: Primary osteoarthritis of both knees  Current Dose per office note on 01/08/2022: not discussed  Okay to refill Voltaren Gel?

## 2022-03-06 DIAGNOSIS — F411 Generalized anxiety disorder: Secondary | ICD-10-CM | POA: Diagnosis not present

## 2022-03-06 DIAGNOSIS — M797 Fibromyalgia: Secondary | ICD-10-CM | POA: Diagnosis not present

## 2022-03-06 DIAGNOSIS — I10 Essential (primary) hypertension: Secondary | ICD-10-CM | POA: Diagnosis not present

## 2022-03-06 DIAGNOSIS — F900 Attention-deficit hyperactivity disorder, predominantly inattentive type: Secondary | ICD-10-CM | POA: Diagnosis not present

## 2022-03-06 DIAGNOSIS — E7800 Pure hypercholesterolemia, unspecified: Secondary | ICD-10-CM | POA: Diagnosis not present

## 2022-03-06 DIAGNOSIS — E1169 Type 2 diabetes mellitus with other specified complication: Secondary | ICD-10-CM | POA: Diagnosis not present

## 2022-03-06 DIAGNOSIS — G4733 Obstructive sleep apnea (adult) (pediatric): Secondary | ICD-10-CM | POA: Diagnosis not present

## 2022-03-06 DIAGNOSIS — E6609 Other obesity due to excess calories: Secondary | ICD-10-CM | POA: Diagnosis not present

## 2022-03-24 ENCOUNTER — Other Ambulatory Visit: Payer: Self-pay | Admitting: Gastroenterology

## 2022-03-29 NOTE — Progress Notes (Deleted)
Office Visit Note  Patient: [name]             Date of Birth: Apr 04, 1971           MRN: [id number]             PCP: Donald Prose, MD Referring: Donald Prose, MD Visit Date: 04/12/2022 Occupation: '@GUAROCC'$ @  Subjective:  No chief complaint on file.   History of Present Illness: [name] is a 51 y.o. female ***   Activities of Daily Living:  Patient reports morning stiffness for *** {minute/hour:19697}.   Patient {ACTIONS;DENIES/REPORTS:21021675::"Denies"} nocturnal pain.  Difficulty dressing/grooming: {ACTIONS;DENIES/REPORTS:21021675::"Denies"} Difficulty climbing stairs: {ACTIONS;DENIES/REPORTS:21021675::"Denies"} Difficulty getting out of chair: {ACTIONS;DENIES/REPORTS:21021675::"Denies"} Difficulty using hands for taps, buttons, cutlery, and/or writing: {ACTIONS;DENIES/REPORTS:21021675::"Denies"}  No Rheumatology ROS completed.   PMFS History:  Patient Active Problem List   Diagnosis Date Noted   Fibromyalgia 12/25/2016   Other fatigue 12/25/2016   Primary insomnia 12/25/2016   Primary osteoarthritis of both knees 12/25/2016   ANA positive 12/25/2016   History of gastroesophageal reflux (GERD) 12/25/2016   History of anemia 12/25/2016   Trochanteric bursitis of both hips 12/25/2016   Anemia, iron deficiency 04/18/2015   Dysphagia, pharyngoesophageal phase 11/14/2011   Esophageal reflux 11/14/2011    Past Medical History:  Diagnosis Date   ADD (attention deficit disorder with hyperactivity)    Allergic rhinitis    Anxiety    Arthritis    Depression    Dysmenorrhea    Fibromyalgia    GERD (gastroesophageal reflux disease)    H. pylori infection    per patient    HLD (hyperlipidemia)    HTN (hypertension)    Insomnia    Irritable bowel syndrome    Restless leg syndrome    Sleep apnea     Family History  Problem Relation Age of Onset   Breast cancer Maternal Grandmother    Diabetes Sister    Irritable bowel syndrome Sister     Heart disease Brother    Heart attack Brother    Stroke Mother    Heart disease Mother    Diabetes Sister    Irritable bowel syndrome Sister    Fibromyalgia Daughter    Colon cancer Neg Hx    Colon polyps Neg Hx    Esophageal cancer Neg Hx    Kidney disease Neg Hx    Gallbladder disease Neg Hx    Stomach cancer Neg Hx    Past Surgical History:  Procedure Laterality Date   BUNIONECTOMY Left 2008   DILATION AND CURETTAGE OF UTERUS  2004   EAR CYST EXCISION Left 1983   KNEE ARTHROSCOPY Right    with knee cap adjustment   UPPER GASTROINTESTINAL ENDOSCOPY  06/29/2020   Social History   Social History Narrative   Not on file   Immunization History  Administered Date(s) Administered   PFIZER(Purple Top)SARS-COV-2 Vaccination 03/11/2020, 04/01/2020, 12/08/2020     Objective: Vital Signs: There were no vitals taken for this visit.   Physical Exam   Musculoskeletal Exam: ***  CDAI Exam: CDAI Score: -- Patient Global: --; Provider Global: -- Swollen: --; Tender: -- Joint Exam 04/12/2022   No joint exam has been documented for this visit   There is currently no information documented on the homunculus. Go to the Rheumatology activity and complete the homunculus joint exam.  Investigation: No additional findings.  Imaging: No results found.  Recent Labs: Lab Results  Component Value Date   WBC 10.1 06/29/2020  HGB 12.7 06/29/2020   PLT 409.0 (H) 06/29/2020   NA 137 06/29/2020   K 3.3 (L) 06/29/2020   CL 101 06/29/2020   CO2 28 06/29/2020   GLUCOSE 191 (H) 06/29/2020   BUN 8 06/29/2020   CREATININE 0.78 06/29/2020   BILITOT 0.3 06/29/2020   ALKPHOS 114 06/29/2020   AST 12 06/29/2020   ALT 14 06/29/2020   PROT 7.8 06/29/2020   ALBUMIN 4.1 06/29/2020   CALCIUM 9.2 06/29/2020   GFRAA >60 12/28/2019    Speciality Comments: No specialty comments available.  Procedures:  No procedures performed Allergies: Lisinopril, Prednisone, and Ritalin  [methylphenidate]   Assessment / Plan:     Visit Diagnoses: No diagnosis found.  Orders: No orders of the defined types were placed in this encounter.  No orders of the defined types were placed in this encounter.   Face-to-face time spent with patient was *** minutes. Greater than 50% of time was spent in counseling and coordination of care.  Follow-Up Instructions: No follow-ups on file.   Earnestine Mealing, CMA  Note - This record has been created using Editor, commissioning.  Chart creation errors have been sought, but may not always  have been located. Such creation errors do not reflect on  the standard of medical care.

## 2022-04-12 ENCOUNTER — Ambulatory Visit: Payer: Medicare HMO | Admitting: Rheumatology

## 2022-04-12 DIAGNOSIS — E559 Vitamin D deficiency, unspecified: Secondary | ICD-10-CM

## 2022-04-12 DIAGNOSIS — Z8719 Personal history of other diseases of the digestive system: Secondary | ICD-10-CM

## 2022-04-12 DIAGNOSIS — M7061 Trochanteric bursitis, right hip: Secondary | ICD-10-CM

## 2022-04-12 DIAGNOSIS — R768 Other specified abnormal immunological findings in serum: Secondary | ICD-10-CM

## 2022-04-12 DIAGNOSIS — F5101 Primary insomnia: Secondary | ICD-10-CM

## 2022-04-12 DIAGNOSIS — Z8639 Personal history of other endocrine, nutritional and metabolic disease: Secondary | ICD-10-CM

## 2022-04-12 DIAGNOSIS — G8929 Other chronic pain: Secondary | ICD-10-CM

## 2022-04-12 DIAGNOSIS — Z862 Personal history of diseases of the blood and blood-forming organs and certain disorders involving the immune mechanism: Secondary | ICD-10-CM

## 2022-04-12 DIAGNOSIS — R600 Localized edema: Secondary | ICD-10-CM

## 2022-04-12 DIAGNOSIS — M170 Bilateral primary osteoarthritis of knee: Secondary | ICD-10-CM

## 2022-04-12 DIAGNOSIS — M797 Fibromyalgia: Secondary | ICD-10-CM

## 2022-04-12 DIAGNOSIS — R5383 Other fatigue: Secondary | ICD-10-CM

## 2022-04-18 ENCOUNTER — Ambulatory Visit: Payer: Medicare HMO | Admitting: Rheumatology

## 2022-04-18 ENCOUNTER — Encounter: Payer: Self-pay | Admitting: Rheumatology

## 2022-04-18 VITALS — BP 129/88 | HR 80 | Ht 66.0 in | Wt 213.0 lb

## 2022-04-18 DIAGNOSIS — E559 Vitamin D deficiency, unspecified: Secondary | ICD-10-CM

## 2022-04-18 DIAGNOSIS — M170 Bilateral primary osteoarthritis of knee: Secondary | ICD-10-CM

## 2022-04-18 DIAGNOSIS — M25511 Pain in right shoulder: Secondary | ICD-10-CM | POA: Diagnosis not present

## 2022-04-18 DIAGNOSIS — F5101 Primary insomnia: Secondary | ICD-10-CM

## 2022-04-18 DIAGNOSIS — M7062 Trochanteric bursitis, left hip: Secondary | ICD-10-CM

## 2022-04-18 DIAGNOSIS — R5383 Other fatigue: Secondary | ICD-10-CM

## 2022-04-18 DIAGNOSIS — M25531 Pain in right wrist: Secondary | ICD-10-CM

## 2022-04-18 DIAGNOSIS — M797 Fibromyalgia: Secondary | ICD-10-CM | POA: Diagnosis not present

## 2022-04-18 DIAGNOSIS — R768 Other specified abnormal immunological findings in serum: Secondary | ICD-10-CM | POA: Diagnosis not present

## 2022-04-18 DIAGNOSIS — M7061 Trochanteric bursitis, right hip: Secondary | ICD-10-CM | POA: Diagnosis not present

## 2022-04-18 DIAGNOSIS — Z8719 Personal history of other diseases of the digestive system: Secondary | ICD-10-CM

## 2022-04-18 DIAGNOSIS — Z8639 Personal history of other endocrine, nutritional and metabolic disease: Secondary | ICD-10-CM

## 2022-04-18 DIAGNOSIS — Z862 Personal history of diseases of the blood and blood-forming organs and certain disorders involving the immune mechanism: Secondary | ICD-10-CM

## 2022-04-18 DIAGNOSIS — G8929 Other chronic pain: Secondary | ICD-10-CM

## 2022-04-18 NOTE — Progress Notes (Signed)
? ?Office Visit Note ? ?Patient: [name]             ?Date of Birth: 19-Jan-1971           ?MRN: [id number]             ?PCP: Donald Prose, MD ?Referring: Donald Prose, MD ?Visit Date: 04/18/2022 ?Occupation: '@GUAROCC'$ @ ? ?Subjective:  ?Increased generalized pain ? ?History of Present Illness: [name] is a 51 y.o. female with history of osteoarthritis and fibromyalgia syndrome.  She states she had a flare of fibromyalgia last week to the point she could not get out of the bed.  She was having generalized muscle pain and muscle spasms.  The symptoms have improved now.  She continues to have some stiffness in her right shoulder, bilateral hands and her knees.  She has intermittent discomfort in her trochanteric bursa.  There is no history of oral ulcers, sicca symptoms, Raynaud's phenomenon, lymphadenopathy, photosensitivity. ? ?Activities of Daily Living:  ?Patient reports morning stiffness for 30 minutes.   ?Patient Reports nocturnal pain.  ?Difficulty dressing/grooming: Denies ?Difficulty climbing stairs: Reports ?Difficulty getting out of chair: Reports ?Difficulty using hands for taps, buttons, cutlery, and/or writing: Reports ? ?Review of Systems  ?Constitutional:  Positive for fatigue.  ?HENT:  Positive for nose dryness. Negative for mouth sores and mouth dryness.   ?Eyes:  Negative for pain, itching and dryness.  ?Respiratory:  Positive for shortness of breath. Negative for difficulty breathing.   ?     Appointment with pulmonologist  ?Cardiovascular:  Positive for palpitations.  ?Gastrointestinal:  Positive for constipation. Negative for blood in stool and diarrhea.  ?Endocrine: Negative for increased urination.  ?Genitourinary:  Negative for difficulty urinating.  ?Musculoskeletal:  Positive for joint pain, joint pain, myalgias, morning stiffness, muscle tenderness and myalgias. Negative for joint swelling.  ?Skin:  Negative for color change, rash, redness and sensitivity to  sunlight.  ?Allergic/Immunologic: Positive for susceptible to infections.  ?Neurological:  Positive for numbness, headaches and weakness.  ?Hematological:  Positive for bruising/bleeding tendency.  ?Psychiatric/Behavioral:  Positive for depressed mood and sleep disturbance. The patient is nervous/anxious.   ? ?PMFS History:  ?Patient Active Problem List  ? Diagnosis Date Noted  ? Fibromyalgia 12/25/2016  ? Other fatigue 12/25/2016  ? Primary insomnia 12/25/2016  ? Primary osteoarthritis of both knees 12/25/2016  ? ANA positive 12/25/2016  ? History of gastroesophageal reflux (GERD) 12/25/2016  ? History of anemia 12/25/2016  ? Trochanteric bursitis of both hips 12/25/2016  ? Anemia, iron deficiency 04/18/2015  ? Dysphagia, pharyngoesophageal phase 11/14/2011  ? Esophageal reflux 11/14/2011  ?  ?Past Medical History:  ?Diagnosis Date  ? ADD (attention deficit disorder with hyperactivity)   ? Allergic rhinitis   ? Anxiety   ? Arthritis   ? Depression   ? Dysmenorrhea   ? Fibromyalgia   ? GERD (gastroesophageal reflux disease)   ? H. pylori infection   ? per patient   ? HLD (hyperlipidemia)   ? HTN (hypertension)   ? Insomnia   ? Irritable bowel syndrome   ? Restless leg syndrome   ? Sleep apnea   ?  ?Family History  ?Problem Relation Age of Onset  ? Breast cancer Maternal Grandmother   ? Diabetes Sister   ? Irritable bowel syndrome Sister   ? Heart disease Brother   ? Heart attack Brother   ? Stroke Mother   ? Heart disease Mother   ? Diabetes Sister   ?  Irritable bowel syndrome Sister   ? Fibromyalgia Daughter   ? Colon cancer Neg Hx   ? Colon polyps Neg Hx   ? Esophageal cancer Neg Hx   ? Kidney disease Neg Hx   ? Gallbladder disease Neg Hx   ? Stomach cancer Neg Hx   ? ?Past Surgical History:  ?Procedure Laterality Date  ? BUNIONECTOMY Left 2008  ? DILATION AND CURETTAGE OF UTERUS  2004  ? EAR CYST EXCISION Left 1983  ? KNEE ARTHROSCOPY Right   ? with knee cap adjustment  ? UPPER GASTROINTESTINAL ENDOSCOPY   06/29/2020  ? ?Social History  ? ?Social History Narrative  ? Not on file  ? ?Immunization History  ?Administered Date(s) Administered  ? PFIZER(Purple Top)SARS-COV-2 Vaccination 03/11/2020, 04/01/2020, 12/08/2020  ?  ? ?Objective: ?Vital Signs: BP 129/88 (BP Location: Left Arm, Patient Position: Sitting, Cuff Size: Large)   Pulse 80   Ht '5\' 6"'$  (1.676 m)   Wt 213 lb (96.6 kg)   BMI 34.38 kg/m?   ? ?Physical Exam ?Vitals and nursing note reviewed.  ?Constitutional:   ?   Appearance: She is well-developed.  ?HENT:  ?   Head: Normocephalic and atraumatic.  ?Eyes:  ?   Conjunctiva/sclera: Conjunctivae normal.  ?Cardiovascular:  ?   Rate and Rhythm: Normal rate and regular rhythm.  ?   Heart sounds: Normal heart sounds.  ?Pulmonary:  ?   Effort: Pulmonary effort is normal.  ?   Breath sounds: Normal breath sounds.  ?Abdominal:  ?   General: Bowel sounds are normal.  ?   Palpations: Abdomen is soft.  ?Musculoskeletal:  ?   Cervical back: Normal range of motion.  ?Lymphadenopathy:  ?   Cervical: No cervical adenopathy.  ?Skin: ?   General: Skin is warm and dry.  ?   Capillary Refill: Capillary refill takes less than 2 seconds.  ?Neurological:  ?   Mental Status: She is alert and oriented to person, place, and time.  ?Psychiatric:     ?   Behavior: Behavior normal.  ?  ? ?Musculoskeletal Exam: C-spine was in range of motion.  She had no tenderness over thoracic or lumbar spine.  Shoulder joints, elbow joints, wrist joints, MCPs PIPs and DIPs and good range of motion with no synovitis.  Hip joints, knee joints, ankles, MTPs and PIPs with good range of motion with no synovitis.  She had generalized hyperalgesia. ? ?CDAI Exam: ?CDAI Score: -- ?Patient Global: --; Provider Global: -- ?Swollen: --; Tender: -- ?Joint Exam 04/18/2022  ? ?No joint exam has been documented for this visit  ? ?There is currently no information documented on the homunculus. Go to the Rheumatology activity and complete the homunculus joint  exam. ? ?Investigation: ?No additional findings. ? ?Imaging: ?No results found. ? ?Recent Labs: ?Lab Results  ?Component Value Date  ? WBC 10.1 06/29/2020  ? HGB 12.7 06/29/2020  ? PLT 409.0 (H) 06/29/2020  ? NA 137 06/29/2020  ? K 3.3 (L) 06/29/2020  ? CL 101 06/29/2020  ? CO2 28 06/29/2020  ? GLUCOSE 191 (H) 06/29/2020  ? BUN 8 06/29/2020  ? CREATININE 0.78 06/29/2020  ? BILITOT 0.3 06/29/2020  ? ALKPHOS 114 06/29/2020  ? AST 12 06/29/2020  ? ALT 14 06/29/2020  ? PROT 7.8 06/29/2020  ? ALBUMIN 4.1 06/29/2020  ? CALCIUM 9.2 06/29/2020  ? GFRAA >60 12/28/2019  ? ? ?Speciality Comments: No specialty comments available. ? ?Procedures:  ?No procedures performed ?Allergies: Lisinopril, Prednisone, and Ritalin [  methylphenidate]  ? ?Assessment / Plan:     ?Visit Diagnoses: Chronic right shoulder pain - Right glenohumeral joint injection April 10, 2021.  She states the shoulder joint pain has improved.  She had good range of motion. ? ?Pain in both wrists-resolved. ? ?Trochanteric bursitis of both hips-she has intermittent discomfort in her trochanteric bursa.  IT band stretches were discussed. ? ?Primary osteoarthritis of both knees - Right knee joint arthroscopic surgery by Dr. Durward Fortes.  She has intermittent pain in her knee joints.  Lower extremity exercises were discussed. ? ?ANA positive - She has no clinical features of lupus.  She denies any history of oral ulcers,, Raynaud's phenomenon, lymphadenopathy, inflammatory arthritis or photosensitivity. ? ?Fibromyalgia - She is on gabapentin 600 mg p.o. nightly, tramadol 50 mg p.o. twice daily as needed from her PCP.  Patient states that she discontinued Flexeril and methocarbamol as it was not effective.  She had a recent flare of fibromyalgia about a week ago which was severe.  The symptoms gradually improved.  Need for regular exercise was emphasized. ? ?Other fatigue-related to insomnia and fibromyalgia. ? ?Primary insomnia - She takes Ambien 5 mg p.o. nightly as  needed. ? ?Other medical problems are listed as follows: ? ?History of diabetes mellitus ? ?Vitamin D deficiency ? ?History of gastroesophageal reflux (GERD) ? ?History of anemia ? ?Orders: ?No orders of the defined types were pl

## 2022-04-23 ENCOUNTER — Telehealth: Payer: Self-pay | Admitting: *Deleted

## 2022-04-23 NOTE — Telephone Encounter (Signed)
Labs received from:[facility] ? ?Drawn on:03/06/2022 ? ?Reviewed by:Hazel Sams, PA-C ? ?Labs drawn:CMP, Lipid Panel Hgb A1C ? ?Results:Hgb  A1C 6.0 ?  Glucose 102 ?   Calc LDL 125 ?   Non HdL 146 ?   Chol/HDL ratio 5.1 ?

## 2022-05-04 ENCOUNTER — Ambulatory Visit (INDEPENDENT_AMBULATORY_CARE_PROVIDER_SITE_OTHER): Payer: Medicare HMO | Admitting: Orthopedic Surgery

## 2022-05-04 ENCOUNTER — Encounter: Payer: Self-pay | Admitting: Orthopedic Surgery

## 2022-05-04 VITALS — Ht 66.0 in | Wt 209.0 lb

## 2022-05-04 DIAGNOSIS — M19011 Primary osteoarthritis, right shoulder: Secondary | ICD-10-CM

## 2022-05-04 DIAGNOSIS — M25511 Pain in right shoulder: Secondary | ICD-10-CM

## 2022-05-04 NOTE — Progress Notes (Signed)
Office Visit Note   Patient: [name]           Date of Birth: 1971-04-12           MRN: [id number] Visit Date: 05/04/2022 Requested by: Donald Prose, MD [address] [place],  [place] [address] PCP: Donald Prose, MD  Subjective: Chief Complaint  Patient presents with  . Right Shoulder - Pain    HPI: [name] is a 51 year old patient with right shoulder pain.  Previous injection only lasted her about 8 days.  That was done a year ago.  She states this was an [facility] joint injection.  Also reports that the pain is getting worse.  Hard for her to raise her arm.  She reports pain laterally superior and anterior which does radiate into the neck at times.  Has throbbing pain which does keep her up at night but not every night.  Hard for her to lay on that right-hand side.  Currently is not working.  She is on disability.  She has fibromyalgia.  MRI scan of the cervical spine last year showed C5-6 mild spurring as well as left-sided pathology at C6-7.              ROS: All systems reviewed are negative as they relate to the chief complaint within the history of present illness.  Patient denies  fevers or chills.   Assessment & Plan: Visit Diagnoses:  1. Arthritis of right acromioclavicular joint   2. Right shoulder pain, unspecified chronicity     Plan: Impression is right-sided AC joint pain.  Ultrasound-guided injection was performed today.  Physical therapy at [place] to increase strength and range of motion.  If that fails to improve her symptoms then MRI arthrogram right shoulder indicated to evaluate for rotator cuff pathology and persistent AC joint pathology.  Follow-Up Instructions: Return if symptoms worsen or fail to improve.   Orders:  Orders Placed This Encounter  Procedures  . Ambulatory referral to Physical Therapy   No orders of the defined types were placed in this encounter.     Procedures: No procedures performed   Clinical Data:I No  additional findings.  Objective: Vital Signs: Ht '5\' 6"'$  (1.676 m)   Wt 209 lb (94.8 kg)   BMI 33.73 kg/m   Physical Exam:   Constitutional: Patient appears well-developed HEENT:  Head: Normocephalic Eyes:EOM are normal Neck: Normal range of motion Cardiovascular: Normal rate Pulmonary/chest: Effort normal Neurologic: Patient is alert Skin: Skin is warm Psychiatric: Patient has normal mood and affect   Ortho Exam: Ortho exam demonstrates good cervical spine range of motion.  5 out of 5 grip EPL FPL interosseous wrist flexion extension bicep triceps and deltoid strength.  Patient has symmetric external rotation at 15 degrees of abduction to about  Specialty Comments:  No specialty comments available.  Imaging: No results found.   PMFS History: Patient Active Problem List   Diagnosis Date Noted  . Fibromyalgia 12/25/2016  . Other fatigue 12/25/2016  . Primary insomnia 12/25/2016  . Primary osteoarthritis of both knees 12/25/2016  . ANA positive 12/25/2016  . History of gastroesophageal reflux (GERD) 12/25/2016  . History of anemia 12/25/2016  . Trochanteric bursitis of both hips 12/25/2016  . Anemia, iron deficiency 04/18/2015  . Dysphagia, pharyngoesophageal phase 11/14/2011  . Esophageal reflux 11/14/2011   Past Medical History:  Diagnosis Date  . ADD (attention deficit disorder with hyperactivity)   . Allergic rhinitis   . Anxiety   .  Arthritis   . Depression   . Dysmenorrhea   . Fibromyalgia   . GERD (gastroesophageal reflux disease)   . H. pylori infection    per patient   . HLD (hyperlipidemia)   . HTN (hypertension)   . Insomnia   . Irritable bowel syndrome   . Restless leg syndrome   . Sleep apnea     Family History  Problem Relation Age of Onset  . Breast cancer Maternal Grandmother   . Diabetes Sister   . Irritable bowel syndrome Sister   . Heart disease Brother   . Heart attack Brother   . Stroke Mother   . Heart disease Mother   .  Diabetes Sister   . Irritable bowel syndrome Sister   . Fibromyalgia Daughter   . Colon cancer Neg Hx   . Colon polyps Neg Hx   . Esophageal cancer Neg Hx   . Kidney disease Neg Hx   . Gallbladder disease Neg Hx   . Stomach cancer Neg Hx     Past Surgical History:  Procedure Laterality Date  . BUNIONECTOMY Left 2008  . DILATION AND CURETTAGE OF UTERUS  2004  . EAR CYST EXCISION Left 1983  . KNEE ARTHROSCOPY Right    with knee cap adjustment  . UPPER GASTROINTESTINAL ENDOSCOPY  06/29/2020   Social History   Occupational History  . Occupation: Disabled  Tobacco Use  . Smoking status: Former    Packs/day: 0.10    Years: 12.00    Pack years: 1.20    Types: Cigarettes    Quit date: 12/27/1993    Years since quitting: 28.3    Passive exposure: Never  . Smokeless tobacco: Never  Vaping Use  . Vaping Use: Never used  Substance and Sexual Activity  . Alcohol use: No  . Drug use: No  . Sexual activity: Yes

## 2022-05-05 MED ORDER — BUPIVACAINE HCL 0.25 % IJ SOLN
0.6600 mL | INTRAMUSCULAR | Status: AC | PRN
  Administered 2022-05-04: .66 mL via INTRA_ARTICULAR

## 2022-05-05 MED ORDER — METHYLPREDNISOLONE ACETATE 40 MG/ML IJ SUSP
13.3300 mg | INTRAMUSCULAR | Status: AC | PRN
  Administered 2022-05-04: 13.33 mg via INTRA_ARTICULAR

## 2022-05-05 MED ORDER — LIDOCAINE HCL 1 % IJ SOLN
3.0000 mL | INTRAMUSCULAR | Status: AC | PRN
  Administered 2022-05-04: 3 mL

## 2022-05-23 ENCOUNTER — Ambulatory Visit: Payer: Medicare HMO | Admitting: Physical Therapy

## 2022-05-28 ENCOUNTER — Ambulatory Visit: Payer: Medicare HMO | Attending: Orthopedic Surgery | Admitting: Physical Therapy

## 2022-06-04 ENCOUNTER — Ambulatory Visit: Payer: Self-pay

## 2022-06-04 ENCOUNTER — Ambulatory Visit (INDEPENDENT_AMBULATORY_CARE_PROVIDER_SITE_OTHER): Payer: Medicare HMO | Admitting: Surgical

## 2022-06-04 ENCOUNTER — Ambulatory Visit (INDEPENDENT_AMBULATORY_CARE_PROVIDER_SITE_OTHER): Payer: Medicare HMO

## 2022-06-04 ENCOUNTER — Encounter: Payer: Self-pay | Admitting: Surgical

## 2022-06-04 DIAGNOSIS — M25512 Pain in left shoulder: Secondary | ICD-10-CM | POA: Diagnosis not present

## 2022-06-04 MED ORDER — TIZANIDINE HCL 4 MG PO TABS: 4.0000 mg | ORAL_TABLET | Freq: Three times a day (TID) | ORAL | 0 refills | Status: DC | PRN

## 2022-06-04 MED ORDER — METHYLPREDNISOLONE ACETATE 40 MG/ML IJ SUSP
13.3300 mg | INTRAMUSCULAR | Status: AC | PRN
  Administered 2022-06-04: 13.33 mg via INTRA_ARTICULAR

## 2022-06-04 MED ORDER — BUPIVACAINE HCL 0.25 % IJ SOLN
0.6600 mL | INTRAMUSCULAR | Status: AC | PRN
  Administered 2022-06-04: .66 mL via INTRA_ARTICULAR

## 2022-06-04 MED ORDER — LIDOCAINE HCL 1 % IJ SOLN
3.0000 mL | INTRAMUSCULAR | Status: AC | PRN
  Administered 2022-06-04: 3 mL

## 2022-06-04 NOTE — Addendum Note (Signed)
Addended by: Donella Stade on: 06/04/2022 10:18 AM   Modules accepted: Orders

## 2022-06-04 NOTE — Progress Notes (Signed)
Office Visit Note   Patient: [name]           Date of Birth: 19-Nov-1971           MRN: [id number] Visit Date: 06/04/2022 Requested by: Donald Prose, MD [address] [place],  [place] [address] PCP: Donald Prose, MD  Subjective: Chief Complaint  Patient presents with   Right Shoulder - Follow-up   Left Shoulder - Pain    HPI: [name] is a 51 y.o. female who presents to the office complaining of bilateral shoulder pain.  Patient was recently seen by Dr. Marlou Sa on 05/04/2022 for right shoulder pain and had AC joint injection that provided excellent relief.  She now states that her right shoulder pain is feeling a lot better by about 75%.  Only really complains of muscle spasms on the right-hand side.  Robaxin and Flexeril have not really helped with this.  She would like to try different muscle relaxer.  More concern for today is her left shoulder pain.  She felt a popping sensation about 3 days ago when she was reaching behind her to scratch her back with her left arm.  She is never really had any issue with her left shoulder in the past.  No prior surgery.  Localizes most pain to the lateral aspect of the shoulder with some radiation to the mid forearm on occasion.  No numbness or tingling, new neck pain, shoulder blade pain.  She has tried ice and heat without any medication use.  She notes occasional catching sensation when she reaches behind her.  Pain is waking her up at night..                ROS: All systems reviewed are negative as they relate to the chief complaint within the history of present illness.  Patient denies fevers or chills.  Assessment & Plan: Visit Diagnoses:  1. Arthralgia of left acromioclavicular joint   2. Left shoulder pain, unspecified chronicity     Plan: Patient is a 51 year old female who presents for evaluation of left shoulder pain.  She has had worsening pain in the last 3 days since a popping sensation.  She has  tenderness primarily over the [facility] joint today and does have some reproduction of pain with testing of rotator cuff strength but overall her strength feels pretty reasonable.  After discussion of options, plan to administer ultrasound-guided injection to the [facility] joint.  Patient tolerated the injection well.  She will follow-up with the office in 4 to 6 weeks for clinical recheck with Dr. Marlou Sa.  If no improvement or limited improvement, plan for further evaluation with MRI to evaluate for [facility] joint arthritis versus rotator cuff pathology.  She will continue with home exercise program that she did for the right shoulder for the left shoulder in the meantime.  Follow-Up Instructions: No follow-ups on file.   Orders:  Orders Placed This Encounter  Procedures   XR Shoulder Left   US Guided Needle Placement - No Linked Charges   No orders of the defined types were placed in this encounter.     Procedures: Medium Joint Inj: L acromioclavicular on 06/04/2022 9:38 AM Indications: pain and diagnostic evaluation Details: 27 G 1.5 in needle, ultrasound-guided superior approach Medications: 13.33 mg methylPREDNISolone acetate 40 MG/ML; 0.66 mL bupivacaine 0.25 %; 3 mL lidocaine 1 % Outcome: tolerated well, no immediate complications Procedure, treatment alternatives, risks and benefits explained, specific risks discussed. Consent was  given by the patient. Immediately prior to procedure a time out was called to verify the correct patient, procedure, equipment, support staff and site/side marked as required. Patient was prepped and draped in the usual sterile fashion.       Clinical Data: No additional findings.  Objective: Vital Signs: There were no vitals taken for this visit.  Physical Exam:  Constitutional: Patient appears well-developed HEENT:  Head: Normocephalic Eyes:EOM are normal Neck: Normal range of motion Cardiovascular: Normal rate Pulmonary/chest: Effort normal Neurologic: Patient  is alert Skin: Skin is warm Psychiatric: Patient has normal mood and affect  Ortho Exam: Ortho exam demonstrates left shoulder with 70 degrees external rotation, 90 degrees abduction, 140 degrees forward flexion.  This compared with the right shoulder with 80 degrees external rotation, 90 degrees abduction, 170 degrees forward flexion.  Probably to get more forward flexion of the left shoulder but pain increases significantly.  No bicipital groove tenderness.  Moderate to severe tenderness over the [facility] joint of the left shoulder.  Excellent rotator cuff strength but it does reproduce her symptoms and some of her strength is a little limited by pain.  5/5 motor strength of bilateral grip strength, finger abduction, pronation/supination, bicep, tricep, deltoid.  No tenderness over the axial cervical spine.  Negative Spurling sign.  Specialty Comments:  No specialty comments available.  Imaging: No results found.   PMFS History: Patient Active Problem List   Diagnosis Date Noted   Fibromyalgia 12/25/2016   Other fatigue 12/25/2016   Primary insomnia 12/25/2016   Primary osteoarthritis of both knees 12/25/2016   ANA positive 12/25/2016   History of gastroesophageal reflux (GERD) 12/25/2016   History of anemia 12/25/2016   Trochanteric bursitis of both hips 12/25/2016   Anemia, iron deficiency 04/18/2015   Dysphagia, pharyngoesophageal phase 11/14/2011   Esophageal reflux 11/14/2011   Past Medical History:  Diagnosis Date   ADD (attention deficit disorder with hyperactivity)    Allergic rhinitis    Anxiety    Arthritis    Depression    Dysmenorrhea    Fibromyalgia    GERD (gastroesophageal reflux disease)    H. pylori infection    per patient    HLD (hyperlipidemia)    HTN (hypertension)    Insomnia    Irritable bowel syndrome    Restless leg syndrome    Sleep apnea     Family History  Problem Relation Age of Onset   Breast cancer Maternal Grandmother    Diabetes Sister     Irritable bowel syndrome Sister    Heart disease Brother    Heart attack Brother    Stroke Mother    Heart disease Mother    Diabetes Sister    Irritable bowel syndrome Sister    Fibromyalgia Daughter    Colon cancer Neg Hx    Colon polyps Neg Hx    Esophageal cancer Neg Hx    Kidney disease Neg Hx    Gallbladder disease Neg Hx    Stomach cancer Neg Hx     Past Surgical History:  Procedure Laterality Date   BUNIONECTOMY Left 2008   DILATION AND CURETTAGE OF UTERUS  2004   EAR CYST EXCISION Left 1983   KNEE ARTHROSCOPY Right    with knee cap adjustment   UPPER GASTROINTESTINAL ENDOSCOPY  06/29/2020   Social History   Occupational History   Occupation: Disabled  Tobacco Use   Smoking status: Former    Packs/day: 0.10    Years: 12.00  Total pack years: 1.20    Types: Cigarettes    Quit date: 12/27/1993    Years since quitting: 28.4    Passive exposure: Never   Smokeless tobacco: Never  Vaping Use   Vaping Use: Never used  Substance and Sexual Activity   Alcohol use: No   Drug use: No   Sexual activity: Yes

## 2022-06-18 DIAGNOSIS — B349 Viral infection, unspecified: Secondary | ICD-10-CM | POA: Diagnosis not present

## 2022-06-18 DIAGNOSIS — Z03818 Encounter for observation for suspected exposure to other biological agents ruled out: Secondary | ICD-10-CM | POA: Diagnosis not present

## 2022-06-24 ENCOUNTER — Other Ambulatory Visit: Payer: Self-pay | Admitting: Surgical

## 2022-06-27 ENCOUNTER — Ambulatory Visit (HOSPITAL_COMMUNITY)
Admission: RE | Admit: 2022-06-27 | Discharge: 2022-06-27 | Disposition: A | Discharge status: Home / Self Care | Payer: Medicare HMO | Source: Ambulatory Visit | Attending: Adult Health | Admitting: Adult Health

## 2022-06-27 DIAGNOSIS — R918 Other nonspecific abnormal finding of lung field: Secondary | ICD-10-CM | POA: Diagnosis not present

## 2022-06-27 DIAGNOSIS — R911 Solitary pulmonary nodule: Secondary | ICD-10-CM | POA: Diagnosis not present

## 2022-06-29 ENCOUNTER — Ambulatory Visit: Payer: Medicare HMO | Admitting: Pulmonary Disease

## 2022-06-29 NOTE — Progress Notes (Deleted)
Synopsis: Referred in May 2022 for chronic cough by Wray Kearns, MD  Subjective:   PATIENT ID: [name] GENDER: female DOB: [birth year], MRN: [id number]  HPI   No chief complaint on file.   [name] is a 51 year old woman, former smoker with history of GERD, dysphagia, and fibromyalgia returns to pulmonary clinic for shortness of breath.    OV 09/12/21 She reports her cough is much improved after stopping lisinopril on 06/2021 after seeing Rexene Edison, NP. She continues to have intermittent cough that is sporadic. She has no night time awakenings from the cough. She denies sputum production. She continues to have on-going reflux symptoms despite '40mg'$  prilosec daily and sleeping on a wedge pillow. Her GERD symptoms are worse in the morning time. She also has intermittent sinus congestion. She is using flonase daily and ipratropium nasal spray as needed. She uses albuterol inhaler as needed for the cough which she reports was mainly from summer allergies.   OV 06/12/21 She reports she is still coughing up blood-tinged sputum.  She is also blowing her nose with bloody secretions.  She has noted that her sinuses have been draining a lot these past few weeks.  She continues to deny any shortness of breath or wheezing with the cough.  HRCT chest on 05/17/2021 shows unremarkable parenchyma and airways.  No nodules or effusions noted.  OV 05/09/21 She reports having cough since last March.  She has been coughing up clear mucus until recently she has noted blood-tinged sputum.  She describes coughing episodes with posttussive emesis.  She has tried using albuterol and cough syrup without relief of her cough.  She does report shortness of breath and chest tightness with the cough.  She denies wheezing.  She is followed by GI for GERD and ongoing symptoms of nausea and vomiting.  She does report history of dysphagia requiring esophageal dilation.  She had EGD last year  with biopsies showing H. pylori positivity in which she has completed treatment.  She is currently taking omeprazole 20 mg twice daily.  She also sleeps with the head of her bed elevated at night.  She denies any epistaxis but she reports a sore in her left nasal passage that can lead to blood-tinged nasal secretions and irritation.   She quit smoking in 1995 and has about a 12 pack year smoking history.  She denies history of blood clots.  Past Medical History:  Diagnosis Date   ADD (attention deficit disorder with hyperactivity)    Allergic rhinitis    Anxiety    Arthritis    Depression    Dysmenorrhea    Fibromyalgia    GERD (gastroesophageal reflux disease)    H. pylori infection    per patient    HLD (hyperlipidemia)    HTN (hypertension)    Insomnia    Irritable bowel syndrome    Restless leg syndrome    Sleep apnea      Family History  Problem Relation Age of Onset   Breast cancer Maternal Grandmother    Diabetes Sister    Irritable bowel syndrome Sister    Heart disease Brother    Heart attack Brother    Stroke Mother    Heart disease Mother    Diabetes Sister    Irritable bowel syndrome Sister    Fibromyalgia Daughter    Colon cancer Neg Hx    Colon polyps Neg Hx    Esophageal cancer Neg Hx  Kidney disease Neg Hx    Gallbladder disease Neg Hx    Stomach cancer Neg Hx      Social History   Socioeconomic History   Marital status: Widowed    Spouse name: Not on file   Number of children: 1   Years of education: Not on file   Highest education level: Not on file  Occupational History   Occupation: Disabled  Tobacco Use   Smoking status: Former    Packs/day: 0.10    Years: 12.00    Total pack years: 1.20    Types: Cigarettes    Quit date: 12/27/1993    Years since quitting: 28.5    Passive exposure: Never   Smokeless tobacco: Never  Vaping Use   Vaping Use: Never used  Substance and Sexual Activity   Alcohol use: No   Drug use: No   Sexual  activity: Yes  Other Topics Concern   Not on file  Social History Narrative   Not on file   Social Determinants of Health   Financial Resource Strain: Not on file  Food Insecurity: Not on file  Transportation Needs: Not on file  Physical Activity: Not on file  Stress: Not on file  Social Connections: Not on file  Intimate Partner Violence: Not on file     Allergies  Allergen Reactions   Lisinopril     Cough    Prednisone     Other reaction(s): rapid heartbeat   Ritalin [Methylphenidate]     Other reaction(s): palpitations     Outpatient Medications Prior to Visit  Medication Sig Dispense Refill   albuterol (VENTOLIN HFA) 108 (90 Base) MCG/ACT inhaler Inhale 2 puffs into the lungs in the morning, at noon, in the evening, and at bedtime.     amLODipine (NORVASC) 10 MG tablet Take 10 mg by mouth daily.     amphetamine-dextroamphetamine (ADDERALL) 10 MG tablet Take 10 mg by mouth daily with breakfast.     benzonatate (TESSALON) 200 MG capsule Take 1 capsule (200 mg total) by mouth 3 (three) times daily as needed for cough. 45 capsule 1   buPROPion (WELLBUTRIN XL) 150 MG 24 hr tablet Take 150 mg by mouth every morning.     diclofenac Sodium (VOLTAREN) 1 % GEL APPLY 2-4 GRAMS TO AFFECTED JOINT 4 TIMES DAILY AS NEEDED. 400 g 2   escitalopram (LEXAPRO) 20 MG tablet Take 20 mg by mouth at bedtime.      gabapentin (NEURONTIN) 300 MG capsule Take 600 mg by mouth at bedtime.  (Patient not taking: Reported on 04/18/2022)     gabapentin (NEURONTIN) 300 MG capsule Take 3 capsules by mouth at bedtime.     metFORMIN (GLUCOPHAGE-XR) 500 MG 24 hr tablet Take 500 mg by mouth every evening.  (Patient not taking: Reported on 04/18/2022)     metFORMIN (GLUCOPHAGE-XR) 500 MG 24 hr tablet Take 2 tablets by mouth 2 (two) times daily.     naproxen (NAPROSYN) 500 MG tablet Take 500 mg by mouth as needed.     omeprazole (PRILOSEC) 20 MG capsule TAKE 2 CAPSULES BY MOUTH EVERY DAY 180 capsule 0   ONETOUCH  VERIO test strip 1 each daily.     OZEMPIC, 0.25 OR 0.5 MG/DOSE, 2 MG/1.5ML SOPN Inject 0.25 mg into the skin at bedtime.      PRAVASTATIN SODIUM PO Take 1 tablet by mouth daily.      tiZANidine (ZANAFLEX) 4 MG tablet TAKE 1 TABLET (4 MG TOTAL) BY MOUTH  EVERY 8 (EIGHT) HOURS AS NEEDED FOR MUSCLE SPASMS 30 tablet 0   traMADol (ULTRAM) 50 MG tablet Take 50 mg by mouth 2 (two) times daily.     zolpidem (AMBIEN) 5 MG tablet TAKE 1 TABLET (5 MG TOTAL) BY MOUTH AT BEDTIME AS NEEDED FOR SLEEP. 30 tablet 0   No facility-administered medications prior to visit.   Review of Systems  Constitutional:  Negative for chills, fever, malaise/fatigue and weight loss.  HENT:  Negative for congestion, nosebleeds, sinus pain and sore throat.   Eyes: Negative.   Respiratory:  Positive for cough. Negative for hemoptysis, sputum production, shortness of breath and wheezing.   Cardiovascular:  Negative for chest pain, palpitations, orthopnea, claudication and leg swelling.  Gastrointestinal:  Positive for heartburn. Negative for abdominal pain, nausea and vomiting.  Genitourinary: Negative.   Musculoskeletal:  Negative for joint pain and myalgias.  Skin:  Negative for rash.  Neurological:  Negative for weakness.  Endo/Heme/Allergies: Negative.   Psychiatric/Behavioral: Negative.      Objective:   There were no vitals filed for this visit.  Physical Exam Constitutional:      General: She is not in acute distress.    Appearance: She is obese. She is not ill-appearing.  HENT:     Head: Normocephalic and atraumatic.     Mouth/Throat:     Mouth: Mucous membranes are moist.     Pharynx: Oropharynx is clear.  Eyes:     General: No scleral icterus.    Conjunctiva/sclera: Conjunctivae normal.     Pupils: Pupils are equal, round, and reactive to light.  Cardiovascular:     Rate and Rhythm: Normal rate and regular rhythm.     Pulses: Normal pulses.     Heart sounds: Normal heart sounds. No murmur  heard. Pulmonary:     Effort: Pulmonary effort is normal.     Breath sounds: Normal breath sounds. No wheezing, rhonchi or rales.  Musculoskeletal:     Right lower leg: No edema.     Left lower leg: No edema.  Skin:    General: Skin is warm and dry.  Neurological:     General: No focal deficit present.     Mental Status: She is alert.     CBC    Component Value Date/Time   WBC 10.1 06/29/2020 1155   RBC 5.17 (H) 06/29/2020 1155   HGB 12.7 06/29/2020 1155   HCT 39.4 06/29/2020 1155   PLT 409.0 (H) 06/29/2020 1155   MCV 76.2 (L) 06/29/2020 1155   MCH 22.7 (L) 12/28/2019 1617   MCHC 32.3 06/29/2020 1155   RDW 17.6 (H) 06/29/2020 1155   LYMPHSABS 4.3 (H) 06/29/2020 1155   MONOABS 0.6 06/29/2020 1155   EOSABS 0.2 06/29/2020 1155   BASOSABS 0.1 06/29/2020 1155      Latest Ref Rng & Units 06/29/2020   11:55 AM 12/28/2019    4:33 PM 12/28/2019    4:17 PM  BMP  Glucose 70 - 99 mg/dL 191  519  593   BUN 6 - 23 mg/dL '8  9  11   '$ Creatinine 0.40 - 1.20 mg/dL 0.78  0.60  0.89   Sodium 135 - 145 mEq/L 137  137  132   Potassium 3.5 - 5.1 mEq/L 3.3  3.9  3.7   Chloride 96 - 112 mEq/L 101  100  97   CO2 19 - 32 mEq/L 28   24   Calcium 8.4 - 10.5 mg/dL 9.2   9.0  Chest imaging: CT Chest 05/17/21 Mediastinum/Nodes: No enlarged mediastinal, hilar, or axillary lymph nodes. Thyroid gland, trachea, and esophagus demonstrate no significant findings.   Lungs/Pleura: 4 mm subpleural nodule of the anterior left upper lobe (series 4, image 87). No evidence of fibrotic interstitial lung disease. No significant air trapping on expiratory phase imaging. No pleural effusion or pneumothorax.  CXR 12/28/2019 The heart size and mediastinal contours are within normal limits. Both lungs are clear. Mild thoracic spondylosis. No blunting of the costophrenic angles.  PFT:     No data to display           Assessment & Plan:   No diagnosis found.  Discussion: [name] is a  51 year old woman, former smoker with history of GERD, dysphagia, and fibromyalgia who returns to pulmonary clinic for cough.  Her cough was related to ACEi as she had significant improvement in her cough since stopping this medication in July 2022. She continues to have intermittent cough which is related to GERD, intermittent sinus congestion and possible reactive airways disease. She is followed by GI for her GERD and is taking '40mg'$  prilosec daily and sleeping on a wedge pillow at night. She can continue flonase 2 sprays daily and ipratropium nasal spray as needed for sinus congestion and drainage. She can continue albuterol inhaler as needed for the cough. We will check pulmonary function tests at her next follow up visit.   Follow up in 4 months.   Freda Jackson, MD [facility] Office: [phone]    Current Outpatient Medications:    albuterol (VENTOLIN HFA) 108 (90 Base) MCG/ACT inhaler, Inhale 2 puffs into the lungs in the morning, at noon, in the evening, and at bedtime., Disp: , Rfl:    amLODipine (NORVASC) 10 MG tablet, Take 10 mg by mouth daily., Disp: , Rfl:    amphetamine-dextroamphetamine (ADDERALL) 10 MG tablet, Take 10 mg by mouth daily with breakfast., Disp: , Rfl:    benzonatate (TESSALON) 200 MG capsule, Take 1 capsule (200 mg total) by mouth 3 (three) times daily as needed for cough., Disp: 45 capsule, Rfl: 1   buPROPion (WELLBUTRIN XL) 150 MG 24 hr tablet, Take 150 mg by mouth every morning., Disp: , Rfl:    diclofenac Sodium (VOLTAREN) 1 % GEL, APPLY 2-4 GRAMS TO AFFECTED JOINT 4 TIMES DAILY AS NEEDED., Disp: 400 g, Rfl: 2   escitalopram (LEXAPRO) 20 MG tablet, Take 20 mg by mouth at bedtime. , Disp: , Rfl:    gabapentin (NEURONTIN) 300 MG capsule, Take 600 mg by mouth at bedtime.  (Patient not taking: Reported on 04/18/2022), Disp: , Rfl:    gabapentin (NEURONTIN) 300 MG capsule, Take 3 capsules by mouth at bedtime., Disp: , Rfl:    metFORMIN  (GLUCOPHAGE-XR) 500 MG 24 hr tablet, Take 500 mg by mouth every evening.  (Patient not taking: Reported on 04/18/2022), Disp: , Rfl:    metFORMIN (GLUCOPHAGE-XR) 500 MG 24 hr tablet, Take 2 tablets by mouth 2 (two) times daily., Disp: , Rfl:    naproxen (NAPROSYN) 500 MG tablet, Take 500 mg by mouth as needed., Disp: , Rfl:    omeprazole (PRILOSEC) 20 MG capsule, TAKE 2 CAPSULES BY MOUTH EVERY DAY, Disp: 180 capsule, Rfl: 0   ONETOUCH VERIO test strip, 1 each daily., Disp: , Rfl:    OZEMPIC, 0.25 OR 0.5 MG/DOSE, 2 MG/1.5ML SOPN, Inject 0.25 mg into the skin at bedtime. , Disp: , Rfl:    PRAVASTATIN SODIUM PO,  Take 1 tablet by mouth daily. , Disp: , Rfl:    tiZANidine (ZANAFLEX) 4 MG tablet, TAKE 1 TABLET (4 MG TOTAL) BY MOUTH EVERY 8 (EIGHT) HOURS AS NEEDED FOR MUSCLE SPASMS, Disp: 30 tablet, Rfl: 0   traMADol (ULTRAM) 50 MG tablet, Take 50 mg by mouth 2 (two) times daily., Disp: , Rfl:    zolpidem (AMBIEN) 5 MG tablet, TAKE 1 TABLET (5 MG TOTAL) BY MOUTH AT BEDTIME AS NEEDED FOR SLEEP., Disp: 30 tablet, Rfl: 0

## 2022-07-01 ENCOUNTER — Encounter (HOSPITAL_COMMUNITY): Payer: Self-pay

## 2022-07-01 ENCOUNTER — Emergency Department (HOSPITAL_COMMUNITY): Payer: Medicare HMO

## 2022-07-01 ENCOUNTER — Other Ambulatory Visit: Payer: Self-pay

## 2022-07-01 ENCOUNTER — Emergency Department (HOSPITAL_COMMUNITY)
Admission: EM | Admit: 2022-07-01 | Discharge: 2022-07-01 | Discharge status: Against Medical Advice | Payer: Medicare HMO | Attending: Physician Assistant | Admitting: Physician Assistant

## 2022-07-01 DIAGNOSIS — W1811XA Fall from or off toilet without subsequent striking against object, initial encounter: Secondary | ICD-10-CM | POA: Diagnosis not present

## 2022-07-01 DIAGNOSIS — R42 Dizziness and giddiness: Secondary | ICD-10-CM | POA: Diagnosis not present

## 2022-07-01 DIAGNOSIS — R110 Nausea: Secondary | ICD-10-CM | POA: Insufficient documentation

## 2022-07-01 DIAGNOSIS — R519 Headache, unspecified: Secondary | ICD-10-CM | POA: Insufficient documentation

## 2022-07-01 DIAGNOSIS — Y92002 Bathroom of unspecified non-institutional (private) residence single-family (private) house as the place of occurrence of the external cause: Secondary | ICD-10-CM | POA: Diagnosis not present

## 2022-07-01 DIAGNOSIS — Z5321 Procedure and treatment not carried out due to patient leaving prior to being seen by health care provider: Secondary | ICD-10-CM | POA: Diagnosis not present

## 2022-07-01 DIAGNOSIS — R531 Weakness: Secondary | ICD-10-CM | POA: Diagnosis not present

## 2022-07-01 LAB — BASIC METABOLIC PANEL
Anion gap: 9 (ref 5–15)
BUN: 9 mg/dL (ref 6–20)
CO2: 24 mmol/L (ref 22–32)
Calcium: 8.7 mg/dL — ABNORMAL LOW (ref 8.9–10.3)
Chloride: 107 mmol/L (ref 98–111)
Creatinine, Ser: 0.74 mg/dL (ref 0.44–1.00)
GFR, Estimated: >60 mL/min (ref >60)
Glucose, Bld: 101 mg/dL — ABNORMAL HIGH (ref 70–99)
Potassium: 3.3 mmol/L — ABNORMAL LOW (ref 3.5–5.1)
Sodium: 140 mmol/L (ref 135–145)

## 2022-07-01 LAB — I-STAT BETA HCG BLOOD, ED (MC, WL, AP ONLY): I-stat hCG, quantitative: <5 m[IU]/mL (ref <5)

## 2022-07-01 LAB — CBC
HCT: 37.5 % (ref 36.0–46.0)
Hemoglobin: 12 g/dL (ref 12.0–15.0)
MCH: 24.8 pg — ABNORMAL LOW (ref 26.0–34.0)
MCHC: 32 g/dL (ref 30.0–36.0)
MCV: 77.6 fL — ABNORMAL LOW (ref 80.0–100.0)
Platelets: 316 10*3/uL (ref 150–400)
RBC: 4.83 MIL/uL (ref 3.87–5.11)
RDW: 16.5 % — ABNORMAL HIGH (ref 11.5–15.5)
WBC: 5.9 10*3/uL (ref 4.0–10.5)
nRBC: 0 % (ref 0.0–0.2)

## 2022-07-01 LAB — CBG MONITORING, ED: Glucose-Capillary: 105 mg/dL — ABNORMAL HIGH (ref 70–99)

## 2022-07-01 NOTE — ED Provider Triage Note (Signed)
Emergency Medicine Provider Triage Evaluation Note  Ameliah Baskins Comer-Steverson , a 51 y.o. female  was evaluated in triage.  Pt complains of fall hitting her head.  Pt reports she fell off of toliet and hit her head  Review of Systems  Positive: weakness Negative: fever  Physical Exam  BP (!) 142/92 (BP Location: Left Arm)   Pulse 88   Temp 98.4 F (36.9 C) (Oral)   Resp 18   Ht '5\' 6"'$  (1.676 m)   Wt 95.7 kg   SpO2 99%   BMI 34.06 kg/m  Gen:   Awake, no distress   Resp:  Normal effort  MSK:   Moves extremities without difficulty  Other:    Medical Decision Making  Medically screening exam initiated at 12:13 PM.  Appropriate orders placed.  Abbegayle Denault Comer-Steverson was informed that the remainder of the evaluation will be completed by another provider, this initial triage assessment does not replace that evaluation, and the importance of remaining in the ED until their evaluation is complete.     Fransico Meadow, [place] 07/01/22 1213

## 2022-07-01 NOTE — ED Triage Notes (Signed)
Pt reports she got up at 3am this morning to use the bathroom. Pt reports while she was sitting on the toilet she fell asleep and and fell forward hitting her head on the ground. Pt now endorses headache, nausea, and dizziness.

## 2022-07-01 NOTE — ED Notes (Signed)
Pt turned in pt labs and stated they were leaving

## 2022-07-02 ENCOUNTER — Ambulatory Visit: Payer: Medicare HMO | Admitting: Orthopedic Surgery

## 2022-07-04 ENCOUNTER — Telehealth: Payer: Self-pay | Admitting: Pulmonary Disease

## 2022-07-04 NOTE — Telephone Encounter (Signed)
Called and gave patient her results of her CT scan and advised her to keep follow up with Dr Erin Fulling on July 25 and that he will go over in more detail with him. Nothing further needed

## 2022-07-10 ENCOUNTER — Other Ambulatory Visit: Payer: Self-pay

## 2022-07-10 ENCOUNTER — Ambulatory Visit: Payer: Self-pay

## 2022-07-10 ENCOUNTER — Ambulatory Visit (INDEPENDENT_AMBULATORY_CARE_PROVIDER_SITE_OTHER): Payer: Medicare HMO | Admitting: Pulmonary Disease

## 2022-07-10 ENCOUNTER — Encounter: Payer: Self-pay | Admitting: Pulmonary Disease

## 2022-07-10 VITALS — BP 120/90 | HR 101 | Temp 98.2°F | Ht 66.0 in | Wt 211.0 lb

## 2022-07-10 DIAGNOSIS — R0982 Postnasal drip: Secondary | ICD-10-CM

## 2022-07-10 DIAGNOSIS — R918 Other nonspecific abnormal finding of lung field: Secondary | ICD-10-CM

## 2022-07-10 DIAGNOSIS — K219 Gastro-esophageal reflux disease without esophagitis: Secondary | ICD-10-CM

## 2022-07-10 DIAGNOSIS — J4530 Mild persistent asthma, uncomplicated: Secondary | ICD-10-CM

## 2022-07-10 DIAGNOSIS — M2550 Pain in unspecified joint: Secondary | ICD-10-CM | POA: Diagnosis not present

## 2022-07-10 LAB — C-REACTIVE PROTEIN: CRP: 1.2 mg/dL (ref 0.5–20.0)

## 2022-07-10 LAB — CK: Total CK: 82 U/L (ref 7–177)

## 2022-07-10 LAB — SEDIMENTATION RATE: Sed Rate: 66 mm/hr — ABNORMAL HIGH (ref 0–30)

## 2022-07-10 NOTE — Patient Outreach (Signed)
[facility]) Care Management  07/10/2022  [name] 08/09/1971 [id number]   Telephone call to patient for nurse call. Patient doing ok.  Discussed [facility] services with patient. Patient agreeable to nurse call.  Care Plan : RN CM Plan of Care  Updates made by Jon Billings, RN since 07/10/2022 12:00 AM     Problem: Chronic Disease Management and Care Coordination Needs of Diabetes   Priority: High     Long-Range Goal: Development of Plan of Care for Management of diabetes   Start Date: 07/10/2022  Expected End Date: 12/16/2022  Priority: High  Note:   Current Barriers:  Chronic Disease Management support and education needs related to DMII   RNCM Clinical Goal(s):  Patient will verbalize basic understanding of  DMII disease process and self health management plan as evidenced by A1c less than 7  through collaboration with RN Care manager, provider, and care team.   Interventions: Education and support related to diabetes Inter-disciplinary care team collaboration (see longitudinal plan of care) Evaluation of current treatment plan related to  self management and patient's adherence to plan as established by provider   Diabetes Interventions:  (Status:  New goal.) Long Term Goal Assessed patient's understanding of A1c goal: <7% Provided education to patient about basic DM disease process Reviewed medications with patient and discussed importance of medication adherence Discussed plans with patient for ongoing care management follow up and provided patient with direct contact information for care management team No results found for: "HGBA1C"  Patient Goals/Self-Care Activities: Take all medications as prescribed Attend all scheduled provider appointments check blood sugar at prescribed times: once daily check feet daily for cuts, sores or redness drink 6 to 8 glasses of water each day manage portion size prepare main meal at home 3 to 5 days each  week  Follow Up Plan:  Telephone follow up appointment with care management team member scheduled for:  October The patient has been provided with contact information for the care management team and has been advised to call with any health related questions or concerns.      Plan: RN CM will provide ongoing education and support to patient through phone calls.   RN CM will send welcome packet with consent to patient.   RN CM will send initial barriers letter, assessment, and care plan to primary care physician.   RN CM will contact patient in October and patient agrees to next contact and care plan  [name], RN, MSN [place] [facility] Care Management Coordinator Direct Line [phone] Toll Free: [phone]  Fax: [phone]

## 2022-07-10 NOTE — Progress Notes (Signed)
Synopsis: Referred in May 2022 for chronic cough by Wray Kearns, MD  Subjective:   PATIENT ID: [name] GENDER: female DOB: Oct 01, 1971, MRN: [id number]  HPI  Chief Complaint  Patient presents with   Follow-up    Follow-up CT, SOB, Cough   [name] is a 51 year old woman, former smoker with history of GERD, dysphagia, and fibromyalgia returns to pulmonary clinic for chronic cough.  She has been doing well since last visit. She continues to have mild intermittent dry cough. She has exertional dyspnea walking around her home. Denies wheezing.   She continues to have diffuse joint pains. She reports her sister has an autoimmune condition that she is on medication and another sister who has fibromyalgia.   We reviewed her CT Chest scan where the subcentimeter pulmonary nodules are stable and a new LUL groundglass nodule measuring 70m.   OV 09/12/21 She reports her cough is much improved after stopping lisinopril on 06/2021 after seeing TRexene Edison NP. She continues to have intermittent cough that is sporadic. She has no night time awakenings from the cough. She denies sputum production. She continues to have on-going reflux symptoms despite '40mg'$  prilosec daily and sleeping on a wedge pillow. Her GERD symptoms are worse in the morning time. She also has intermittent sinus congestion. She is using flonase daily and ipratropium nasal spray as needed. She uses albuterol inhaler as needed for the cough which she reports was mainly from summer allergies.   OV 06/12/21 She reports she is still coughing up blood-tinged sputum.  She is also blowing her nose with bloody secretions.  She has noted that her sinuses have been draining a lot these past few weeks.  She continues to deny any shortness of breath or wheezing with the cough.  HRCT chest on 05/17/2021 shows unremarkable parenchyma and airways.  No nodules or effusions noted.  OV 05/09/21 She reports having cough  since last March.  She has been coughing up clear mucus until recently she has noted blood-tinged sputum.  She describes coughing episodes with posttussive emesis.  She has tried using albuterol and cough syrup without relief of her cough.  She does report shortness of breath and chest tightness with the cough.  She denies wheezing.  She is followed by GI for GERD and ongoing symptoms of nausea and vomiting.  She does report history of dysphagia requiring esophageal dilation.  She had EGD last year with biopsies showing H. pylori positivity in which she has completed treatment.  She is currently taking omeprazole 20 mg twice daily.  She also sleeps with the head of her bed elevated at night.  She denies any epistaxis but she reports a sore in her left nasal passage that can lead to blood-tinged nasal secretions and irritation.   She quit smoking in 1995 and has about a 12 pack year smoking history.  She denies history of blood clots.  Past Medical History:  Diagnosis Date   ADD (attention deficit disorder with hyperactivity)    Allergic rhinitis    Anxiety    Arthritis    Depression    Dysmenorrhea    Fibromyalgia    GERD (gastroesophageal reflux disease)    H. pylori infection    per patient    HLD (hyperlipidemia)    HTN (hypertension)    Insomnia    Irritable bowel syndrome    Restless leg syndrome    Sleep apnea      Family History  Problem  Relation Age of Onset   Breast cancer Maternal Grandmother    Diabetes Sister    Irritable bowel syndrome Sister    Heart disease Brother    Heart attack Brother    Stroke Mother    Heart disease Mother    Diabetes Sister    Irritable bowel syndrome Sister    Fibromyalgia Daughter    Colon cancer Neg Hx    Colon polyps Neg Hx    Esophageal cancer Neg Hx    Kidney disease Neg Hx    Gallbladder disease Neg Hx    Stomach cancer Neg Hx      Social History   Socioeconomic History   Marital status: Widowed    Spouse name: Not on  file   Number of children: 1   Years of education: Not on file   Highest education level: Not on file  Occupational History   Occupation: Disabled  Tobacco Use   Smoking status: Former    Packs/day: 0.10    Years: 12.00    Total pack years: 1.20    Types: Cigarettes    Quit date: 12/27/1993    Years since quitting: 28.5    Passive exposure: Never   Smokeless tobacco: Never  Vaping Use   Vaping Use: Never used  Substance and Sexual Activity   Alcohol use: No   Drug use: No   Sexual activity: Yes  Other Topics Concern   Not on file  Social History Narrative   Not on file   Social Determinants of Health   Financial Resource Strain: Not on file  Food Insecurity: Not on file  Transportation Needs: Not on file  Physical Activity: Not on file  Stress: Not on file  Social Connections: Not on file  Intimate Partner Violence: Not on file     Allergies  Allergen Reactions   Lisinopril     Cough    Methylphenidate Other (See Comments)    Other reaction(s): palpitations   Prednisone     Other reaction(s): rapid heartbeat     Outpatient Medications Prior to Visit  Medication Sig Dispense Refill   albuterol (VENTOLIN HFA) 108 (90 Base) MCG/ACT inhaler Inhale 2 puffs into the lungs in the morning, at noon, in the evening, and at bedtime.     amLODipine (NORVASC) 10 MG tablet Take 10 mg by mouth daily.     amphetamine-dextroamphetamine (ADDERALL) 10 MG tablet Take 10 mg by mouth daily with breakfast.     buPROPion (WELLBUTRIN XL) 150 MG 24 hr tablet Take 150 mg by mouth every morning.     diclofenac Sodium (VOLTAREN) 1 % GEL APPLY 2-4 GRAMS TO AFFECTED JOINT 4 TIMES DAILY AS NEEDED. 400 g 2   escitalopram (LEXAPRO) 20 MG tablet Take 20 mg by mouth at bedtime.      gabapentin (NEURONTIN) 300 MG capsule Take 3 capsules by mouth at bedtime.     metFORMIN (GLUCOPHAGE-XR) 500 MG 24 hr tablet Take 2 tablets by mouth 2 (two) times daily.     naproxen (NAPROSYN) 500 MG tablet Take 500  mg by mouth as needed.     omeprazole (PRILOSEC) 20 MG capsule TAKE 2 CAPSULES BY MOUTH EVERY DAY 180 capsule 0   ONETOUCH VERIO test strip 1 each daily.     OZEMPIC, 0.25 OR 0.5 MG/DOSE, 2 MG/1.5ML SOPN Inject 0.25 mg into the skin at bedtime.      PRAVASTATIN SODIUM PO Take 1 tablet by mouth daily.  tiZANidine (ZANAFLEX) 4 MG tablet TAKE 1 TABLET (4 MG TOTAL) BY MOUTH EVERY 8 (EIGHT) HOURS AS NEEDED FOR MUSCLE SPASMS 30 tablet 0   traMADol (ULTRAM) 50 MG tablet Take 50 mg by mouth 2 (two) times daily.     zolpidem (AMBIEN) 5 MG tablet TAKE 1 TABLET (5 MG TOTAL) BY MOUTH AT BEDTIME AS NEEDED FOR SLEEP. 30 tablet 0   gabapentin (NEURONTIN) 300 MG capsule Take 600 mg by mouth at bedtime.  (Patient not taking: Reported on 04/18/2022)     metFORMIN (GLUCOPHAGE-XR) 500 MG 24 hr tablet Take 500 mg by mouth every evening.  (Patient not taking: Reported on 04/18/2022)     No facility-administered medications prior to visit.   Review of Systems  Constitutional:  Negative for chills, fever, malaise/fatigue and weight loss.  HENT:  Negative for congestion, nosebleeds, sinus pain and sore throat.   Eyes: Negative.   Respiratory:  Positive for cough. Negative for hemoptysis, sputum production, shortness of breath and wheezing.   Cardiovascular:  Negative for chest pain, palpitations, orthopnea, claudication and leg swelling.  Gastrointestinal:  Positive for heartburn. Negative for abdominal pain, nausea and vomiting.  Genitourinary: Negative.   Musculoskeletal:  Negative for joint pain and myalgias.  Skin:  Negative for rash.  Neurological:  Negative for weakness.  Endo/Heme/Allergies: Negative.   Psychiatric/Behavioral: Negative.     Objective:   Vitals:   07/10/22 0905  BP: 120/90  Pulse: (!) 101  Temp: 98.2 F (36.8 C)  TempSrc: Oral  SpO2: 98%  Weight: 211 lb (95.7 kg)  Height: '5\' 6"'$  (1.676 m)    Physical Exam Constitutional:      General: She is not in acute distress.     Appearance: She is obese. She is not ill-appearing.  HENT:     Head: Normocephalic and atraumatic.  Eyes:     General: No scleral icterus. Cardiovascular:     Rate and Rhythm: Normal rate and regular rhythm.     Pulses: Normal pulses.     Heart sounds: Normal heart sounds. No murmur heard. Pulmonary:     Effort: Pulmonary effort is normal.     Breath sounds: Normal breath sounds. No wheezing, rhonchi or rales.  Musculoskeletal:     Right lower leg: No edema.     Left lower leg: No edema.  Skin:    General: Skin is warm and dry.  Neurological:     General: No focal deficit present.     Mental Status: She is alert.     CBC    Component Value Date/Time   WBC 5.9 07/01/2022 1246   RBC 4.83 07/01/2022 1246   HGB 12.0 07/01/2022 1246   HCT 37.5 07/01/2022 1246   PLT 316 07/01/2022 1246   MCV 77.6 (L) 07/01/2022 1246   MCH 24.8 (L) 07/01/2022 1246   MCHC 32.0 07/01/2022 1246   RDW 16.5 (H) 07/01/2022 1246   LYMPHSABS 4.3 (H) 06/29/2020 1155   MONOABS 0.6 06/29/2020 1155   EOSABS 0.2 06/29/2020 1155   BASOSABS 0.1 06/29/2020 1155      Latest Ref Rng & Units 07/01/2022   12:46 PM 06/29/2020   11:55 AM 12/28/2019    4:33 PM  BMP  Glucose 70 - 99 mg/dL 101  191  519   BUN 6 - 20 mg/dL '9  8  9   '$ Creatinine 0.44 - 1.00 mg/dL 0.74  0.78  0.60   Sodium 135 - 145 mmol/L 140  137  137  Potassium 3.5 - 5.1 mmol/L 3.3  3.3  3.9   Chloride 98 - 111 mmol/L 107  101  100   CO2 22 - 32 mmol/L 24  28    Calcium 8.9 - 10.3 mg/dL 8.7  9.2     Chest imaging: CT Chest 06/27/22 1. New 5 mm part-solid pulmonary nodule within the left upper lobe. Per Fleischner Society Guidelines, no routine follow-up imaging is recommended. These guidelines do not apply to immunocompromised patients and patients with cancer. Follow up in patients with significant comorbidities as clinically warranted. For lung cancer screening, adhere to Lung-RADS guidelines. Reference: Radiology.  2017; 284(1):228-43. 2. Additional bilateral pulmonary nodules are stable measuring up to 5 mm. Consider additional follow-up in 12 months.  CT Chest 05/17/21 Mediastinum/Nodes: No enlarged mediastinal, hilar, or axillary lymph nodes. Thyroid gland, trachea, and esophagus demonstrate no significant findings.   Lungs/Pleura: 4 mm subpleural nodule of the anterior left upper lobe (series 4, image 87). No evidence of fibrotic interstitial lung disease. No significant air trapping on expiratory phase imaging. No pleural effusion or pneumothorax.  CXR 12/28/2019 The heart size and mediastinal contours are within normal limits. Both lungs are clear. Mild thoracic spondylosis. No blunting of the costophrenic angles.  PFT:     No data to display           Assessment & Plan:   Mild persistent reactive airway disease without complication  Gastroesophageal reflux disease without esophagitis  Post-nasal drainage  Lung nodules  Discussion: [name] is a 51 year old woman, former smoker with history of GERD, dysphagia, and fibromyalgia who returns to pulmonary clinic for cough.  Her cough was related to ACEi as she had significant improvement in her cough since stopping this medication in July 2022. She continues to have intermittent cough which is related to GERD and intermittent sinus congestion. She is followed by GI for her GERD and is taking '40mg'$  prilosec daily and sleeping on a wedge pillow at night. She can continue flonase 2 sprays daily and ipratropium nasal spray as needed for sinus congestion and drainage. We will check pulmonary function tests and autoimmune workup for the pulmonary nodules and cough. We will also check hypersensitivity panel.  Follow up in 6 months.   Freda Jackson, MD [facility] Office: [phone]    Current Outpatient Medications:    albuterol (VENTOLIN HFA) 108 (90 Base) MCG/ACT inhaler, Inhale 2 puffs into  the lungs in the morning, at noon, in the evening, and at bedtime., Disp: , Rfl:    amLODipine (NORVASC) 10 MG tablet, Take 10 mg by mouth daily., Disp: , Rfl:    amphetamine-dextroamphetamine (ADDERALL) 10 MG tablet, Take 10 mg by mouth daily with breakfast., Disp: , Rfl:    buPROPion (WELLBUTRIN XL) 150 MG 24 hr tablet, Take 150 mg by mouth every morning., Disp: , Rfl:    diclofenac Sodium (VOLTAREN) 1 % GEL, APPLY 2-4 GRAMS TO AFFECTED JOINT 4 TIMES DAILY AS NEEDED., Disp: 400 g, Rfl: 2   escitalopram (LEXAPRO) 20 MG tablet, Take 20 mg by mouth at bedtime. , Disp: , Rfl:    gabapentin (NEURONTIN) 300 MG capsule, Take 3 capsules by mouth at bedtime., Disp: , Rfl:    metFORMIN (GLUCOPHAGE-XR) 500 MG 24 hr tablet, Take 2 tablets by mouth 2 (two) times daily., Disp: , Rfl:    naproxen (NAPROSYN) 500 MG tablet, Take 500 mg by mouth as needed., Disp: , Rfl:    omeprazole (PRILOSEC) 20  MG capsule, TAKE 2 CAPSULES BY MOUTH EVERY DAY, Disp: 180 capsule, Rfl: 0   ONETOUCH VERIO test strip, 1 each daily., Disp: , Rfl:    OZEMPIC, 0.25 OR 0.5 MG/DOSE, 2 MG/1.5ML SOPN, Inject 0.25 mg into the skin at bedtime. , Disp: , Rfl:    PRAVASTATIN SODIUM PO, Take 1 tablet by mouth daily. , Disp: , Rfl:    tiZANidine (ZANAFLEX) 4 MG tablet, TAKE 1 TABLET (4 MG TOTAL) BY MOUTH EVERY 8 (EIGHT) HOURS AS NEEDED FOR MUSCLE SPASMS, Disp: 30 tablet, Rfl: 0   traMADol (ULTRAM) 50 MG tablet, Take 50 mg by mouth 2 (two) times daily., Disp: , Rfl:    zolpidem (AMBIEN) 5 MG tablet, TAKE 1 TABLET (5 MG TOTAL) BY MOUTH AT BEDTIME AS NEEDED FOR SLEEP., Disp: 30 tablet, Rfl: 0

## 2022-07-10 NOTE — Patient Instructions (Signed)
Patient Goals/Self-Care Activities: Take all medications as prescribed Attend all scheduled provider appointments check blood sugar at prescribed times: once daily check feet daily for cuts, sores or redness drink 6 to 8 glasses of water each day manage portion size prepare main meal at home 3 to 5 days each week

## 2022-07-10 NOTE — Patient Outreach (Signed)
[facility]) Care Management  07/10/2022  [name] [birth year] [id number]   Telephone call to patient for nurse call.  No answer.  HIPAA compliant voice message left.    Plan: RN CM will attempt again within 4 business days and send letter.   [name], RN, MSN [facility] Care Management Care Management Coordinator Direct Line [phone] Toll Free: [phone]  Fax: [phone]

## 2022-07-10 NOTE — Patient Instructions (Addendum)
We will schedule you for pulmonary function tests at your convenience and message you with the results  We will check inflammatory labs today  Follow up in 6 months

## 2022-07-10 NOTE — Telephone Encounter (Signed)
Sending to Dr. Erin Fulling as Juluis Rainier.

## 2022-07-11 ENCOUNTER — Ambulatory Visit: Payer: Self-pay

## 2022-07-11 ENCOUNTER — Other Ambulatory Visit: Payer: Self-pay | Admitting: Surgical

## 2022-07-11 NOTE — Telephone Encounter (Signed)
Okay to refill thanks

## 2022-07-12 LAB — ALDOLASE: Aldolase: 4 U/L (ref <=8.1)

## 2022-07-16 LAB — RNP ANTIBODIES: ENA RNP Ab: 1.8 AI — ABNORMAL HIGH (ref 0.0–0.9)

## 2022-07-16 LAB — HYPERSENSITIVITY PNEUMONITIS
A. Pullulans Abs: NEGATIVE
A.Fumigatus #1 Abs: NEGATIVE
Micropolyspora faeni, IgG: NEGATIVE
Pigeon Serum Abs: NEGATIVE
Thermoact. Saccharii: NEGATIVE
Thermoactinomyces vulgaris, IgG: NEGATIVE

## 2022-07-17 ENCOUNTER — Ambulatory Visit: Payer: Self-pay

## 2022-07-17 NOTE — Progress Notes (Signed)
Thank you for forwarding her labs.  We will schedule an appointment to discuss lab findings and treatment plan.  She had several autoimmune antibodies positive now.  She may have mixed connective tissue disease based on positive centromere positive RNP and positive ANA.  Based on the lung findings does she needs to be on CellCept or Imuran?

## 2022-07-18 LAB — ANTI-SCLERODERMA ANTIBODY: Scleroderma (Scl-70) (ENA) Antibody, IgG: <1 AI

## 2022-07-18 LAB — CYCLIC CITRUL PEPTIDE ANTIBODY, IGG: Cyclic Citrullin Peptide Ab: <16 UNITS

## 2022-07-18 LAB — CENTROMERE ANTIBODIES: Centromere Ab Screen: 7.4 AI — AB

## 2022-07-18 LAB — ANTI-NUCLEAR AB-TITER (ANA TITER)
ANA TITER: 1:1280 {titer} — ABNORMAL HIGH
ANA Titer 1: 1:320 {titer} — ABNORMAL HIGH

## 2022-07-18 LAB — ANCA SCREEN W REFLEX TITER: ANCA SCREEN: NEGATIVE

## 2022-07-18 LAB — SJOGREN'S SYNDROME ANTIBODS(SSA + SSB)
SSA (Ro) (ENA) Antibody, IgG: <1 AI
SSB (La) (ENA) Antibody, IgG: <1 AI

## 2022-07-18 LAB — RHEUMATOID FACTOR: Rheumatoid fact SerPl-aCnc: <14 IU/mL (ref <14)

## 2022-07-18 LAB — ANA: Anti Nuclear Antibody (ANA): POSITIVE — AB

## 2022-07-18 LAB — ANTI-SMITH ANTIBODY: ENA SM Ab Ser-aCnc: <1 AI

## 2022-07-18 LAB — ANTI-DNA ANTIBODY, DOUBLE-STRANDED: ds DNA Ab: <1 IU/mL

## 2022-07-18 NOTE — Progress Notes (Unsigned)
Office Visit Note  Patient: [name]             Date of Birth: 1971/05/11           MRN: [id number]             PCP: Donald Prose, MD Referring: Donald Prose, MD Visit Date: 07/19/2022 Occupation: @GUAROCC @  Subjective:  Pain in joints   History of Present Illness: [name] is a 51 y.o. female with history of osteoarthritis, fibromyalgia and positive ANA.  She was seen today due to recent abnormal lab work.  Patient states she has had history of reflux for several years.  She states her reflux symptoms were getting worse in the last 1 year.  She also has history of chronic cough.  She was referred to Dr. Erin Fulling by her gastroenterologist due to chronic cough.  She had a high-resolution CT which showed multiple nodules.  There was no evidence of ILD.  She was evaluated by Dr. Erin Fulling and had some labs which showed positive ANA, positive RNP and elevated sedimentation rate.  She had repeat CT scan to evaluate the pulmonary nodules.  She continues to have pain and discomfort in her bilateral shoulders, bilateral hips and bilateral knee joints.  She notices some swelling in her knees.  She denies any history of oral ulcers, nasal ulcers, sicca symptoms, Raynaud's phenomenon, or lymphadenopathy.  She gives history of hair loss and sun sensitivity.  There is no history of inflammatory arthritis.  Patient states that she had upper respiratory tract infection 3 weeks prior to the labs were drawn.  Activities of Daily Living:  Patient reports morning stiffness for 1-6 hours.   Patient Reports nocturnal pain.  Difficulty dressing/grooming: Denies Difficulty climbing stairs: Reports Difficulty getting out of chair: Reports Difficulty using hands for taps, buttons, cutlery, and/or writing: Denies  Review of Systems  Constitutional:  Positive for fatigue.  HENT:  Negative for mouth sores and mouth dryness.   Eyes:  Negative for dryness.  Respiratory:  Positive for shortness  of breath.   Cardiovascular:  Negative for palpitations.  Gastrointestinal:  Positive for abdominal pain and diarrhea. Negative for blood in stool and constipation.  Endocrine: Negative for increased urination.  Genitourinary:  Negative for involuntary urination.  Musculoskeletal:  Positive for joint pain, joint pain, joint swelling, myalgias, muscle weakness, morning stiffness, muscle tenderness and myalgias.  Skin:  Positive for hair loss and sensitivity to sunlight. Negative for color change and rash.  Allergic/Immunologic: Negative for susceptible to infections.  Neurological:  Positive for headaches. Negative for dizziness.  Hematological:  Negative for swollen glands.  Psychiatric/Behavioral:  Positive for depressed mood and sleep disturbance. The patient is nervous/anxious.     PMFS History:  Patient Active Problem List   Diagnosis Date Noted   Fibromyalgia 12/25/2016   Other fatigue 12/25/2016   Primary insomnia 12/25/2016   Primary osteoarthritis of both knees 12/25/2016   ANA positive 12/25/2016   History of gastroesophageal reflux (GERD) 12/25/2016   History of anemia 12/25/2016   Trochanteric bursitis of both hips 12/25/2016   Anemia, iron deficiency 04/18/2015   Dysphagia, pharyngoesophageal phase 11/14/2011   Esophageal reflux 11/14/2011    Past Medical History:  Diagnosis Date   ADD (attention deficit disorder with hyperactivity)    Allergic rhinitis    Anxiety    Arthritis    Depression    Dysmenorrhea    Fibromyalgia    GERD (gastroesophageal reflux disease)    H.  pylori infection    per patient    HLD (hyperlipidemia)    HTN (hypertension)    Insomnia    Irritable bowel syndrome    Restless leg syndrome    Sleep apnea     Family History  Problem Relation Age of Onset   Breast cancer Maternal Grandmother    Diabetes Sister    Irritable bowel syndrome Sister    Heart disease Brother    Heart attack Brother    Stroke Mother    Heart disease Mother     Diabetes Sister    Irritable bowel syndrome Sister    Fibromyalgia Daughter    Colon cancer Neg Hx    Colon polyps Neg Hx    Esophageal cancer Neg Hx    Kidney disease Neg Hx    Gallbladder disease Neg Hx    Stomach cancer Neg Hx    Past Surgical History:  Procedure Laterality Date   BUNIONECTOMY Left 2008   DILATION AND CURETTAGE OF UTERUS  2004   EAR CYST EXCISION Left 1983   KNEE ARTHROSCOPY Right    with knee cap adjustment   UPPER GASTROINTESTINAL ENDOSCOPY  06/29/2020   Social History   Social History Narrative   Not on file   Immunization History  Administered Date(s) Administered   Influenza Split 09/01/2014, 09/26/2016, 10/03/2018   Influenza, Quadrivalent, Recombinant, Inj, Pf 09/21/2020, 09/12/2021   Influenza,inj,Quad PF,6+ Mos 10/03/2018   Influenza-Unspecified 09/12/2021   PFIZER(Purple Top)SARS-COV-2 Vaccination 03/11/2020, 04/01/2020, 12/08/2020   PNEUMOCOCCAL CONJUGATE-20 05/04/2022   Zoster, Live 05/04/2022     Objective: Vital Signs: BP (!) 146/92 (BP Location: Left Arm, Patient Position: Sitting, Cuff Size: Small)   Pulse 80   Resp 15   Ht 5' 6"  (1.676 m)   Wt 212 lb 3.2 oz (96.3 kg)   LMP 12/18/2019 (Approximate)   BMI 34.25 kg/m    Physical Exam Vitals and nursing note reviewed.  Constitutional:      Appearance: She is well-developed.  HENT:     Head: Normocephalic and atraumatic.  Eyes:     Conjunctiva/sclera: Conjunctivae normal.  Cardiovascular:     Rate and Rhythm: Normal rate and regular rhythm.     Heart sounds: Normal heart sounds.  Pulmonary:     Effort: Pulmonary effort is normal.     Breath sounds: Normal breath sounds.  Abdominal:     General: Bowel sounds are normal.     Palpations: Abdomen is soft.  Musculoskeletal:     Cervical back: Normal range of motion.  Lymphadenopathy:     Cervical: No cervical adenopathy.  Skin:    General: Skin is warm and dry.     Capillary Refill: Capillary refill takes less than 2  seconds.  Neurological:     Mental Status: She is alert and oriented to person, place, and time.  Psychiatric:        Behavior: Behavior normal.      Musculoskeletal Exam: Cervical, thoracic and lumbar spine were in good range of motion.  She had no point tenderness over her spine.  Shoulder joints, elbow joints, wrist joints, MCPs PIPs and DIPs with good range of motion with no synovitis.  Hip joints and knee joints with good range of motion.  No warmth swelling or effusion was noted.  She had no tenderness over ankles or MTPs.  She had generalized hyperalgesia and positive tender points.  CDAI Exam: CDAI Score: -- Patient Global: --; Provider Global: -- Swollen: --; Tender: -- Joint  Exam 07/19/2022   No joint exam has been documented for this visit   There is currently no information documented on the homunculus. Go to the Rheumatology activity and complete the homunculus joint exam.  Investigation: No additional findings.  Imaging: CT Head Wo Contrast  Result Date: 07/01/2022 CLINICAL DATA:  Facial trauma, blunt.  Fall.  Dizziness. EXAM: CT HEAD WITHOUT CONTRAST TECHNIQUE: Contiguous axial images were obtained from the base of the skull through the vertex without intravenous contrast. RADIATION DOSE REDUCTION: This exam was performed according to the departmental dose-optimization program which includes automated exposure control, adjustment of the mA and/or kV according to patient size and/or use of iterative reconstruction technique. COMPARISON:  None Available. FINDINGS: Brain: No acute infarct, hemorrhage, or mass lesion is present. No significant white matter lesions are present. The ventricles are of normal size. No significant extraaxial fluid collection is present. The brainstem and cerebellum are within normal limits. Vascular: No hyperdense vessel or unexpected calcification. Skull: Calvarium is intact. No focal lytic or blastic lesions are present. Right supraorbital scalp  soft tissue swelling is noted without underlying fracture or foreign body. Sinuses/Orbits: The paranasal sinuses and mastoid air cells are clear. The globes and orbits are within normal limits. IMPRESSION: 1. Right supraorbital scalp soft tissue swelling without underlying fracture or foreign body. 2. Normal CT appearance of the brain. Electronically Signed   By: San Morelle M.D.   On: 07/01/2022 12:54   CT CHEST WO CONTRAST  Result Date: 06/29/2022 CLINICAL DATA:  History of lung nodule. EXAM: CT CHEST WITHOUT CONTRAST TECHNIQUE: Multidetector CT imaging of the chest was performed following the standard protocol without IV contrast. RADIATION DOSE REDUCTION: This exam was performed according to the departmental dose-optimization program which includes automated exposure control, adjustment of the mA and/or kV according to patient size and/or use of iterative reconstruction technique. COMPARISON:  Chest CT 05/17/2021 FINDINGS: Cardiovascular: Normal heart size. No pericardial effusion. Aorta and main pulmonary artery normal in caliber. Mediastinum/Nodes: No enlarged axillary, mediastinal or hilar lymphadenopathy. Esophagus is unremarkable. Lungs/Pleura: Central airways are patent. Stable 4 mm subpleural right upper lobe nodule (image 23; series 5). Stable 4 mm subpleural right upper lobe nodule (image 49; series 5). Stable 3 mm right upper lobe nodule (image 55; series 5). Stable 3 mm subpleural right upper lobe nodule (image 59; series 5). Stable 4 mm subpleural left upper lobe nodule (image 50; series 5). Stable 3 mm subpleural left lower lobe nodule (image 86; series 5). There is a new 5 mm subsolid left upper lobe nodule (image 41; series 5). Upper Abdomen: Liver is diffusely low in attenuation compatible with steatosis. No acute process. Musculoskeletal: Thoracic spine degenerative changes. IMPRESSION: 1. New 5 mm part-solid pulmonary nodule within the left upper lobe. Per Fleischner Society  Guidelines, no routine follow-up imaging is recommended. These guidelines do not apply to immunocompromised patients and patients with cancer. Follow up in patients with significant comorbidities as clinically warranted. For lung cancer screening, adhere to Lung-RADS guidelines. Reference: Radiology. 2017; 284(1):228-43. 2. Additional bilateral pulmonary nodules are stable measuring up to 5 mm. Consider additional follow-up in 12 months. Electronically Signed   By: Lovey Newcomer M.D.   On: 06/29/2022 09:04    Recent Labs: Lab Results  Component Value Date   WBC 5.9 07/01/2022   HGB 12.0 07/01/2022   PLT 316 07/01/2022   NA 140 07/01/2022   K 3.3 (L) 07/01/2022   CL 107 07/01/2022   CO2 24 07/01/2022  GLUCOSE 101 (H) 07/01/2022   BUN 9 07/01/2022   CREATININE 0.74 07/01/2022   BILITOT 0.3 06/29/2020   ALKPHOS 114 06/29/2020   AST 12 06/29/2020   ALT 14 06/29/2020   PROT 7.8 06/29/2020   ALBUMIN 4.1 06/29/2020   CALCIUM 8.7 (L) 07/01/2022   GFRAA >60 12/28/2019   July 10, 2022 ANA 1: 1280 centromere, 1: 320 speckled, centromere antibody positive, RNP 1.8 (dsDNA, SCL 70, Smith, SSA, SSB negative), ESR 66, CK 82, aldolase 4.0, ANCA negative, myositis panel pending   Speciality Comments: No specialty comments available.  Procedures:  No procedures performed Allergies: Lisinopril, Methylphenidate, and Prednisone   Assessment / Plan:     Visit Diagnoses: ANA positive -patient has known history of positive ANA.  She was recently evaluated by Dr. Erin Fulling for pulmonary nodulosis and interstitial pattern in the nodules.  He repeated the autoimmune panel which showed positive ANA centromere pattern and positive RNP.  I did detailed discussion with the patient regarding positive ANA centromere pattern and its association with the scleroderma.  She had no sclerodactyly, nailbed capillary changes or Raynaud's phenomenon.  She gives history of gastroesophageal reflux and intermittent dysphagia.  I  advised her to discuss the symptoms of dysphagia with her gastro enterologist Dr. Loletha Carrow.  She also gives history of chronic cough and shortness of breath.  She states that PFTs are pending.  I will obtain some additional autoimmune labs.  She denies any history of oral ulcers, nasal ulcers, sicca symptoms, raynauds phenomenon or lymphadenopathy.  She has experienced some hair loss over the years.  She also gives history of photosensitivity.  She gives history of joint swelling but no synovitis was noted on the examination.  I will obtain additional labs today.- Plan: C3 and C4, Beta-2 glycoprotein antibodies, Cardiolipin antibodies, IgG, IgM, IgA  Elevated sed rate -her sed rate was significantly elevated at 66.  Patient states she had an upper respiratory tract infection about 3 weeks prior to the labs.I will repeat sedimentation rate today.  Plan: Sedimentation rate, Serum protein electrophoresis with reflex.  If her repeat sedimentation rate is elevated I may consider total body bone scan to look for inflammation.  Chronic right shoulder pain -she complains of discomfort in her shoulder joints.  She had left shoulder joint x-ray on June 04, 2022 which showed mild degenerative changes.  Right shoulder joint x-ray in the past was unremarkable.  She may benefit from physical therapy.  She had right glenohumeral joint injection April 10, 2021.   Trochanteric bursitis of both hips-she complains of bilateral hip joint pain.  Her hip joints were in good range of motion.  She continues to have discomfort in bilateral trochanteric bursa most likely related to fibromyalgia.  Primary osteoarthritis of both knees - Right knee joint arthroscopic surgery by Dr. Durward Fortes.  She complains of swelling in her knee joints.  No synovitis no swelling was noted on the examination.  Fibromyalgia -she continues to have generalized pain and discomfort.  She has several tender points and generalized hyperalgesia.  She is on she  is on gabapentin 900 mg p.o. nightly, tramadol 50 mg p.o. twice daily as needed from her PCP.  Other fatigue-she continues to have fatigue related to insomnia and fibromyalgia.  Primary insomnia -she is on Ambien 5 mg p.o. nightly as needed.  H/O multiple pulmonary nodules-I reviewed previous CT scans and also pulmonary notes.  She had high-resolution CT on May 17, 2021 which showed subpleural nodule of the anterior left  upper lobe.  There was no evidence of fibrotic interstitial lung disease.  She had a repeat CT scan on June 29, 2022 which showed multiple subpleural nodules since in the right upper lobe and left upper lobe.  She gives history of chronic cough and shortness of breath.  She is also a former smoker.  Patient's cough improved after stopping ACE inhibitor's.  PFTs are pending.  Dr. Erin Fulling concerned about possible autoimmune process based on positive ANA and positive RNP  History of diabetes mellitus  History of gastroesophageal reflux (GERD)-patient is followed by Dr. Loletha Carrow.  She has been evaluated for nausea vomiting and abdominal discomfort.  She complains of dysphagia today.  I advised her to be evaluated by Dr. Loletha Carrow again she has centromere pattern but has no clinical features for scleroderma at this point.  Vitamin D deficiency  History of anemia  Family history of morphea-Sister . (patient's sister does not have lupus or rheumatoid arthritis as she mentioned to Dr. Erin Fulling in the past.)  Orders: Orders Placed This Encounter  Procedures   Sedimentation rate   Serum protein electrophoresis with reflex   C3 and C4   Beta-2 glycoprotein antibodies   Cardiolipin antibodies, IgG, IgM, IgA   No orders of the defined types were placed in this encounter.   Face-to-face time spent with patient was more than 60 minutes. Greater than 50% of time was spent in counseling and coordination of care.  Follow-Up Instructions: Return in about 2 months (around 09/18/2022) for Positive  ANA, joint pain.   Bo Merino, MD  Note - This record has been created using Editor, commissioning.  Chart creation errors have been sought, but may not always  have been located. Such creation errors do not reflect on  the standard of medical care.

## 2022-07-19 ENCOUNTER — Encounter: Payer: Self-pay | Admitting: Rheumatology

## 2022-07-19 ENCOUNTER — Ambulatory Visit: Payer: Medicare HMO | Attending: Rheumatology | Admitting: Rheumatology

## 2022-07-19 VITALS — BP 146/92 | HR 80 | Resp 15 | Ht 66.0 in | Wt 212.2 lb

## 2022-07-19 DIAGNOSIS — Z862 Personal history of diseases of the blood and blood-forming organs and certain disorders involving the immune mechanism: Secondary | ICD-10-CM

## 2022-07-19 DIAGNOSIS — M170 Bilateral primary osteoarthritis of knee: Secondary | ICD-10-CM

## 2022-07-19 DIAGNOSIS — Z87898 Personal history of other specified conditions: Secondary | ICD-10-CM

## 2022-07-19 DIAGNOSIS — R768 Other specified abnormal immunological findings in serum: Secondary | ICD-10-CM | POA: Diagnosis not present

## 2022-07-19 DIAGNOSIS — M797 Fibromyalgia: Secondary | ICD-10-CM

## 2022-07-19 DIAGNOSIS — M25531 Pain in right wrist: Secondary | ICD-10-CM

## 2022-07-19 DIAGNOSIS — R7689 Other specified abnormal immunological findings in serum: Secondary | ICD-10-CM

## 2022-07-19 DIAGNOSIS — G8929 Other chronic pain: Secondary | ICD-10-CM

## 2022-07-19 DIAGNOSIS — M7062 Trochanteric bursitis, left hip: Secondary | ICD-10-CM

## 2022-07-19 DIAGNOSIS — F5101 Primary insomnia: Secondary | ICD-10-CM

## 2022-07-19 DIAGNOSIS — M25511 Pain in right shoulder: Secondary | ICD-10-CM

## 2022-07-19 DIAGNOSIS — Z8639 Personal history of other endocrine, nutritional and metabolic disease: Secondary | ICD-10-CM

## 2022-07-19 DIAGNOSIS — R5383 Other fatigue: Secondary | ICD-10-CM | POA: Diagnosis not present

## 2022-07-19 DIAGNOSIS — R700 Elevated erythrocyte sedimentation rate: Secondary | ICD-10-CM

## 2022-07-19 DIAGNOSIS — M7061 Trochanteric bursitis, right hip: Secondary | ICD-10-CM

## 2022-07-19 DIAGNOSIS — Z8719 Personal history of other diseases of the digestive system: Secondary | ICD-10-CM

## 2022-07-19 DIAGNOSIS — E559 Vitamin D deficiency, unspecified: Secondary | ICD-10-CM

## 2022-07-19 DIAGNOSIS — Z840 Family history of diseases of the skin and subcutaneous tissue: Secondary | ICD-10-CM

## 2022-07-19 NOTE — Progress Notes (Signed)
Thank you for forwarding her labs.  I will be seeing the patient in the office today.  She had several autoimmune antibodies positive now.  She may have mixed connective tissue disease based on positive centromere positive RNP and positive ANA.  Based on the lung findings does she needs to be on CellCept or Imuran?

## 2022-07-25 ENCOUNTER — Ambulatory Visit: Payer: Medicare HMO | Admitting: Gastroenterology

## 2022-07-25 LAB — PROTEIN ELECTROPHORESIS, SERUM, WITH REFLEX
Albumin ELP: 4.1 g/dL (ref 3.8–4.8)
Alpha 1: 0.4 g/dL — ABNORMAL HIGH (ref 0.2–0.3)
Alpha 2: 0.8 g/dL (ref 0.5–0.9)
Beta 2: 0.6 g/dL — ABNORMAL HIGH (ref 0.2–0.5)
Beta Globulin: 0.6 g/dL (ref 0.4–0.6)
Gamma Globulin: 1.3 g/dL (ref 0.8–1.7)
Total Protein: 7.9 g/dL (ref 6.1–8.1)

## 2022-07-25 LAB — BETA-2 GLYCOPROTEIN ANTIBODIES
Beta-2 Glyco 1 IgA: <2 U/mL (ref <20.0)
Beta-2 Glyco 1 IgM: <2 U/mL (ref <20.0)
Beta-2 Glyco I IgG: <2 U/mL (ref <20.0)

## 2022-07-25 LAB — CARDIOLIPIN ANTIBODIES, IGG, IGM, IGA
Anticardiolipin IgA: 5.1 APL-U/mL (ref <20.0)
Anticardiolipin IgG: <2 GPL-U/mL (ref <20.0)
Anticardiolipin IgM: <2 MPL-U/mL (ref <20.0)

## 2022-07-25 LAB — C3 AND C4
C3 Complement: 208 mg/dL — ABNORMAL HIGH (ref 83–193)
C4 Complement: 33 mg/dL (ref 15–57)

## 2022-07-25 LAB — SEDIMENTATION RATE: Sed Rate: 28 mm/h (ref 0–30)

## 2022-07-25 NOTE — Progress Notes (Signed)
SPEP normal, complements normal, sed rate normal, beta-2 negative, anticardiolipin negative.

## 2022-07-27 LAB — MYOMARKER 3 PLUS PROFILE (RDL)

## 2022-07-30 ENCOUNTER — Telehealth: Payer: Self-pay | Admitting: Pulmonary Disease

## 2022-07-30 NOTE — Telephone Encounter (Signed)
See encounter from 7/25. Closing message.

## 2022-08-03 ENCOUNTER — Telehealth: Payer: Self-pay | Admitting: Pulmonary Disease

## 2022-08-03 ENCOUNTER — Telehealth: Payer: Self-pay | Admitting: Rheumatology

## 2022-08-03 DIAGNOSIS — E559 Vitamin D deficiency, unspecified: Secondary | ICD-10-CM | POA: Diagnosis not present

## 2022-08-03 DIAGNOSIS — Z0000 Encounter for general adult medical examination without abnormal findings: Secondary | ICD-10-CM | POA: Diagnosis not present

## 2022-08-03 DIAGNOSIS — F330 Major depressive disorder, recurrent, mild: Secondary | ICD-10-CM | POA: Diagnosis not present

## 2022-08-03 DIAGNOSIS — E7800 Pure hypercholesterolemia, unspecified: Secondary | ICD-10-CM | POA: Diagnosis not present

## 2022-08-03 DIAGNOSIS — E1140 Type 2 diabetes mellitus with diabetic neuropathy, unspecified: Secondary | ICD-10-CM | POA: Diagnosis not present

## 2022-08-03 DIAGNOSIS — A6000 Herpesviral infection of urogenital system, unspecified: Secondary | ICD-10-CM | POA: Diagnosis not present

## 2022-08-03 DIAGNOSIS — F909 Attention-deficit hyperactivity disorder, unspecified type: Secondary | ICD-10-CM | POA: Diagnosis not present

## 2022-08-03 DIAGNOSIS — L989 Disorder of the skin and subcutaneous tissue, unspecified: Secondary | ICD-10-CM | POA: Diagnosis not present

## 2022-08-03 DIAGNOSIS — I10 Essential (primary) hypertension: Secondary | ICD-10-CM | POA: Diagnosis not present

## 2022-08-03 NOTE — Telephone Encounter (Signed)
Attempted to contact the patient and left message advising patient after chart review I do not see that she is due for any lab work.

## 2022-08-03 NOTE — Telephone Encounter (Signed)
Faxed over lab results as requested by pt after speaking with her. Nothing further needed at this time.

## 2022-08-03 NOTE — Telephone Encounter (Signed)
Patient called the office requesting lab orders be sent to Dr. Margaretha Sheffield at [facility]. Patient has appointment there at 11:30. Fax# [phone]

## 2022-08-04 ENCOUNTER — Other Ambulatory Visit: Payer: Self-pay | Admitting: Pulmonary Disease

## 2022-08-04 DIAGNOSIS — R0982 Postnasal drip: Secondary | ICD-10-CM

## 2022-08-13 ENCOUNTER — Other Ambulatory Visit: Payer: Self-pay | Admitting: Orthopedic Surgery

## 2022-08-14 ENCOUNTER — Other Ambulatory Visit: Payer: Self-pay | Admitting: Pulmonary Disease

## 2022-08-14 DIAGNOSIS — R0982 Postnasal drip: Secondary | ICD-10-CM

## 2022-08-16 ENCOUNTER — Other Ambulatory Visit: Payer: Self-pay

## 2022-08-16 DIAGNOSIS — F330 Major depressive disorder, recurrent, mild: Secondary | ICD-10-CM | POA: Diagnosis not present

## 2022-08-16 NOTE — Patient Outreach (Signed)
[facility]) Care Management  08/16/2022  [name] [birth year] [id number]   RN CM closing case:  pt will be followed for care coordination by [facility].  [name], RN, MSN [facility] Care Management Care Management Coordinator Direct Line [phone] Toll Free: [phone]  Fax: [phone]

## 2022-08-27 NOTE — Progress Notes (Signed)
Did not see this patient

## 2022-09-05 NOTE — Progress Notes (Deleted)
Office Visit Note  Patient: [name]             Date of Birth: [birth year]           MRN: [id number]             PCP: Donald Prose, MD Referring: Donald Prose, MD Visit Date: 09/19/2022 Occupation: '@GUAROCC'$ @  Subjective:  No chief complaint on file.   History of Present Illness: [name] is a 51 y.o. female ***   Activities of Daily Living:  Patient reports morning stiffness for *** {minute/hour:19697}.   Patient {ACTIONS;DENIES/REPORTS:21021675::"Denies"} nocturnal pain.  Difficulty dressing/grooming: {ACTIONS;DENIES/REPORTS:21021675::"Denies"} Difficulty climbing stairs: {ACTIONS;DENIES/REPORTS:21021675::"Denies"} Difficulty getting out of chair: {ACTIONS;DENIES/REPORTS:21021675::"Denies"} Difficulty using hands for taps, buttons, cutlery, and/or writing: {ACTIONS;DENIES/REPORTS:21021675::"Denies"}  No Rheumatology ROS completed.   PMFS History:  Patient Active Problem List   Diagnosis Date Noted  . Fibromyalgia 12/25/2016  . Other fatigue 12/25/2016  . Primary insomnia 12/25/2016  . Primary osteoarthritis of both knees 12/25/2016  . ANA positive 12/25/2016  . History of gastroesophageal reflux (GERD) 12/25/2016  . History of anemia 12/25/2016  . Trochanteric bursitis of both hips 12/25/2016  . Anemia, iron deficiency 04/18/2015  . Dysphagia, pharyngoesophageal phase 11/14/2011  . Esophageal reflux 11/14/2011    Past Medical History:  Diagnosis Date  . ADD (attention deficit disorder with hyperactivity)   . Allergic rhinitis   . Anxiety   . Arthritis   . Depression   . Dysmenorrhea   . Fibromyalgia   . GERD (gastroesophageal reflux disease)   . H. pylori infection    per patient   . HLD (hyperlipidemia)   . HTN (hypertension)   . Insomnia   . Irritable bowel syndrome   . Restless leg syndrome   . Sleep apnea     Family History  Problem Relation Age of Onset  . Breast cancer Maternal Grandmother   . Diabetes Sister   .  Irritable bowel syndrome Sister   . Heart disease Brother   . Heart attack Brother   . Stroke Mother   . Heart disease Mother   . Diabetes Sister   . Irritable bowel syndrome Sister   . Fibromyalgia Daughter   . Colon cancer Neg Hx   . Colon polyps Neg Hx   . Esophageal cancer Neg Hx   . Kidney disease Neg Hx   . Gallbladder disease Neg Hx   . Stomach cancer Neg Hx    Past Surgical History:  Procedure Laterality Date  . BUNIONECTOMY Left 2008  . DILATION AND CURETTAGE OF UTERUS  2004  . EAR CYST EXCISION Left 1983  . KNEE ARTHROSCOPY Right    with knee cap adjustment  . UPPER GASTROINTESTINAL ENDOSCOPY  06/29/2020   Social History   Social History Narrative  . Not on file   Immunization History  Administered Date(s) Administered  . Influenza Split 09/01/2014, 09/26/2016, 10/03/2018  . Influenza, Quadrivalent, Recombinant, Inj, Pf 09/21/2020, 09/12/2021  . Influenza,inj,Quad PF,6+ Mos 10/03/2018  . Influenza-Unspecified 09/12/2021  . PFIZER(Purple Top)SARS-COV-2 Vaccination 03/11/2020, 04/01/2020, 12/08/2020  . PNEUMOCOCCAL CONJUGATE-20 05/04/2022  . Zoster, Live 05/04/2022     Objective: Vital Signs: There were no vitals taken for this visit.   Physical Exam   Musculoskeletal Exam: ***  CDAI Exam: CDAI Score: -- Patient Global: --; Provider Global: -- Swollen: --; Tender: -- Joint Exam 09/19/2022   No joint exam has been documented for this visit   There is currently no information documented on the homunculus. Go  to the Rheumatology activity and complete the homunculus joint exam.  Investigation: No additional findings.  Imaging: No results found.  Recent Labs: Lab Results  Component Value Date   WBC 5.9 07/01/2022   HGB 12.0 07/01/2022   PLT 316 07/01/2022   NA 140 07/01/2022   K 3.3 (L) 07/01/2022   CL 107 07/01/2022   CO2 24 07/01/2022   GLUCOSE 101 (H) 07/01/2022   BUN 9 07/01/2022   CREATININE 0.74 07/01/2022   BILITOT 0.3 06/29/2020    ALKPHOS 114 06/29/2020   AST 12 06/29/2020   ALT 14 06/29/2020   PROT 7.9 07/19/2022   ALBUMIN 4.1 06/29/2020   CALCIUM 8.7 (L) 07/01/2022   GFRAA >60 12/28/2019    Speciality Comments: No specialty comments available.  Procedures:  No procedures performed Allergies: Lisinopril, Methylphenidate, and Prednisone   Assessment / Plan:     Visit Diagnoses: No diagnosis found.  Orders: No orders of the defined types were placed in this encounter.  No orders of the defined types were placed in this encounter.   Face-to-face time spent with patient was *** minutes. Greater than 50% of time was spent in counseling and coordination of care.  Follow-Up Instructions: No follow-ups on file.   Earnestine Mealing, CMA  Note - This record has been created using Editor, commissioning.  Chart creation errors have been sought, but may not always  have been located. Such creation errors do not reflect on  the standard of medical care.

## 2022-09-15 DIAGNOSIS — F330 Major depressive disorder, recurrent, mild: Secondary | ICD-10-CM | POA: Diagnosis not present

## 2022-09-19 ENCOUNTER — Ambulatory Visit: Payer: Medicare HMO | Admitting: Rheumatology

## 2022-09-19 DIAGNOSIS — Z862 Personal history of diseases of the blood and blood-forming organs and certain disorders involving the immune mechanism: Secondary | ICD-10-CM

## 2022-09-19 DIAGNOSIS — G8929 Other chronic pain: Secondary | ICD-10-CM

## 2022-09-19 DIAGNOSIS — Z8719 Personal history of other diseases of the digestive system: Secondary | ICD-10-CM

## 2022-09-19 DIAGNOSIS — F5101 Primary insomnia: Secondary | ICD-10-CM

## 2022-09-19 DIAGNOSIS — N6489 Other specified disorders of breast: Secondary | ICD-10-CM | POA: Diagnosis not present

## 2022-09-19 DIAGNOSIS — M170 Bilateral primary osteoarthritis of knee: Secondary | ICD-10-CM

## 2022-09-19 DIAGNOSIS — E559 Vitamin D deficiency, unspecified: Secondary | ICD-10-CM

## 2022-09-19 DIAGNOSIS — Z87898 Personal history of other specified conditions: Secondary | ICD-10-CM

## 2022-09-19 DIAGNOSIS — N644 Mastodynia: Secondary | ICD-10-CM | POA: Diagnosis not present

## 2022-09-19 DIAGNOSIS — R768 Other specified abnormal immunological findings in serum: Secondary | ICD-10-CM

## 2022-09-19 DIAGNOSIS — Z8639 Personal history of other endocrine, nutritional and metabolic disease: Secondary | ICD-10-CM

## 2022-09-19 DIAGNOSIS — R922 Inconclusive mammogram: Secondary | ICD-10-CM | POA: Diagnosis not present

## 2022-09-19 DIAGNOSIS — R700 Elevated erythrocyte sedimentation rate: Secondary | ICD-10-CM

## 2022-09-19 DIAGNOSIS — R5383 Other fatigue: Secondary | ICD-10-CM

## 2022-09-19 DIAGNOSIS — M7061 Trochanteric bursitis, right hip: Secondary | ICD-10-CM

## 2022-09-19 DIAGNOSIS — Z840 Family history of diseases of the skin and subcutaneous tissue: Secondary | ICD-10-CM

## 2022-09-19 DIAGNOSIS — M797 Fibromyalgia: Secondary | ICD-10-CM

## 2022-09-19 NOTE — Progress Notes (Unsigned)
Office Visit Note  Patient: [name]             Date of Birth: 24-Jun-1971           MRN: [id number]             PCP: Donald Prose, MD Referring: Donald Prose, MD Visit Date: 09/26/2022 Occupation: _0 @  Subjective:  Left knee joint pain   History of Present Illness: [name] is a 51 y.o. female with history of positive ANA, fibromyalgia, and osteoarthritis.  Patient was last seen in the office on 07/19/2022 to review abnormal lab work.  The patient has been undergoing a thorough work-up by GI as well as pulmonary.  She is scheduled for an esophageal manometry on 01/02/23.  She has not followed back up with Dr. Erin Fulling yet for PFTs.  She continues to experience dyspnea on exertion.   She presents today with increased pain in the left knee joint.  She denies any mechanical symptoms.  She has noticed swelling in the left knee intermittently.  She has not noticed the improvement in her left knee joint pain with the use of Voltaren gel.  She is been taking tramadol for pain relief.  She has not followed up with Dr. Marlou Sa yet for further evaluation of her left knee joint pain.  She continues to take tizanidine 4 mg at bedtime as needed for muscle spasms which has been helpful for the pain she experiences in her neck and both shoulders.  She takes Tylenol as needed for pain relief as well.  She has ongoing stiffness in both hands.  She denies any swelling in her hands.  She has noticed some increased tightness and shininess of her skin on her hands.  She denies any sun sensitivity or recent rashes.  She has intermittent symptoms of Raynaud's especially in her feet.  She has chronic dry mouth and has had recurrent nasal ulcers but no oral ulcers.      Activities of Daily Living:  Patient reports morning stiffness for 1 hour.   Patient Reports nocturnal pain.  Difficulty dressing/grooming: Denies Difficulty climbing stairs: Reports Difficulty getting out of chair:  Reports Difficulty using hands for taps, buttons, cutlery, and/or writing: Reports  Review of Systems  Constitutional:  Positive for fatigue.  HENT:  Positive for mouth dryness. Negative for mouth sores.        Ear and nose sores  Eyes:  Negative for dryness.  Respiratory:  Positive for shortness of breath.   Cardiovascular:  Positive for chest pain and palpitations.  Gastrointestinal:  Negative for blood in stool, constipation and diarrhea.  Endocrine: Negative for increased urination.  Genitourinary:  Negative for involuntary urination.  Musculoskeletal:  Positive for joint pain, gait problem, joint pain, joint swelling, myalgias, muscle weakness, morning stiffness, muscle tenderness and myalgias.  Skin:  Positive for hair loss. Negative for color change, rash and sensitivity to sunlight.  Allergic/Immunologic: Negative for susceptible to infections.  Neurological:  Positive for dizziness and headaches.  Hematological:  Negative for swollen glands.  Psychiatric/Behavioral:  Negative for depressed mood and sleep disturbance. The patient is not nervous/anxious.     PMFS History:  Patient Active Problem List   Diagnosis Date Noted   Fibromyalgia 12/25/2016   Other fatigue 12/25/2016   Primary insomnia 12/25/2016   Primary osteoarthritis of both knees 12/25/2016   ANA positive 12/25/2016   History of gastroesophageal reflux (GERD) 12/25/2016   History of anemia 12/25/2016   Trochanteric bursitis  of both hips 12/25/2016   Anemia, iron deficiency 04/18/2015   Dysphagia, pharyngoesophageal phase 11/14/2011   Esophageal reflux 11/14/2011    Past Medical History:  Diagnosis Date   ADD (attention deficit disorder with hyperactivity)    Allergic rhinitis    Anxiety    Arthritis    Depression    Dysmenorrhea    Fibromyalgia    GERD (gastroesophageal reflux disease)    H. pylori infection    per patient    HLD (hyperlipidemia)    HTN (hypertension)    Insomnia    Irritable  bowel syndrome    Restless leg syndrome    Sleep apnea     Family History  Problem Relation Age of Onset   Breast cancer Maternal Grandmother    Diabetes Sister    Irritable bowel syndrome Sister    Heart disease Brother    Heart attack Brother    Stroke Mother    Heart disease Mother    Diabetes Sister    Irritable bowel syndrome Sister    Fibromyalgia Daughter    Colon cancer Neg Hx    Colon polyps Neg Hx    Esophageal cancer Neg Hx    Kidney disease Neg Hx    Gallbladder disease Neg Hx    Stomach cancer Neg Hx    Past Surgical History:  Procedure Laterality Date   BUNIONECTOMY Left 2008   DILATION AND CURETTAGE OF UTERUS  2004   EAR CYST EXCISION Left 1983   KNEE ARTHROSCOPY Right    with knee cap adjustment   UPPER GASTROINTESTINAL ENDOSCOPY  06/29/2020   Social History   Social History Narrative   Not on file   Immunization History  Administered Date(s) Administered   Influenza Split 09/01/2014, 09/26/2016, 10/03/2018   Influenza, Quadrivalent, Recombinant, Inj, Pf 09/21/2020, 09/12/2021   Influenza,inj,Quad PF,6+ Mos 10/03/2018   Influenza-Unspecified 09/12/2021   PFIZER(Purple Top)SARS-COV-2 Vaccination 03/11/2020, 04/01/2020, 12/08/2020   PNEUMOCOCCAL CONJUGATE-20 05/04/2022   Zoster, Live 05/04/2022     Objective: Vital Signs: BP (!) 155/101 (BP Location: Left Arm, Patient Position: Sitting, Cuff Size: Normal)   Pulse (!) 108   Resp 16   Ht _0  (1.676 m)   Wt 212 lb 6.4 oz (96.3 kg)   BMI 34.28 kg/m    Physical Exam Vitals and nursing note reviewed.  Constitutional:      Appearance: She is well-developed.  HENT:     Head: Normocephalic and atraumatic.  Eyes:     Conjunctiva/sclera: Conjunctivae normal.  Cardiovascular:     Rate and Rhythm: Normal rate and regular rhythm.     Heart sounds: Normal heart sounds.  Pulmonary:     Effort: Pulmonary effort is normal.     Breath sounds: Normal breath sounds.  Abdominal:     General: Bowel  sounds are normal.     Palpations: Abdomen is soft.  Musculoskeletal:     Cervical back: Normal range of motion.  Skin:    General: Skin is warm and dry.     Capillary Refill: Capillary refill takes less than 2 seconds.  Neurological:     Mental Status: She is alert and oriented to person, place, and time.  Psychiatric:        Behavior: Behavior normal.      Musculoskeletal Exam: Positive tender points on exam. C-spine, thoracic spine, lumbar spine have good range of motion.  Shoulder joints, elbow joints, wrist joints, MCPs, PIPs, DIPs have good range of motion with no synovitis.  Complete fist formation bilaterally.  Tenderness on the dorsal aspect of the right wrist.  Hip joints have good range of motion with no groin pain.  Painful range of motion of the left knee.  Right knee joint has good range of motion with no warmth or effusion.  Ankle joints have good range of motion with no tenderness or joint swelling.  CDAI Exam: CDAI Score: -- Patient Global: --; Provider Global: -- Swollen: --; Tender: -- Joint Exam 09/26/2022   No joint exam has been documented for this visit   There is currently no information documented on the homunculus. Go to the Rheumatology activity and complete the homunculus joint exam.  Investigation: No additional findings.  Imaging: No results found.  Recent Labs: Lab Results  Component Value Date   WBC 5.9 07/01/2022   HGB 12.0 07/01/2022   PLT 316 07/01/2022   NA 140 07/01/2022   K 3.3 (L) 07/01/2022   CL 107 07/01/2022   CO2 24 07/01/2022   GLUCOSE 101 (H) 07/01/2022   BUN 9 07/01/2022   CREATININE 0.74 07/01/2022   BILITOT 0.3 06/29/2020   ALKPHOS 114 06/29/2020   AST 12 06/29/2020   ALT 14 06/29/2020   PROT 7.9 07/19/2022   ALBUMIN 4.1 06/29/2020   CALCIUM 8.7 (L) 07/01/2022   GFRAA >60 12/28/2019    Speciality Comments: No specialty comments available.  Procedures:  No procedures performed Allergies: Lisinopril,  Methylphenidate, and Prednisone       Assessment / Plan:     Visit Diagnoses: ANA positive - Patient has a known history of positive ANA.   Lab work from 07/10/2022 was reviewed today in the office: ANA 1: 1280 centromere, 1: 320 speckled, centromere antibody positive, RNP 1.8, ESR 66, CK and aldolase WNL, dsDNA-, Scl-70-, Ro-, La- ANCA-, myositis panel negative.  Lab work from 07/19/2022 was reviewed with the patient today in the office: SPEP did not reveal any abnormal protein bands, ESR 28, beta-2 glycoprotein antibodies negative, anticardiolipin antibodies negative, C3 208, C4 WNL.   Patient was last seen in the office on 07/19/2022 to review lab work from 07/10/2022.  The significance of a positive ANA with centromere pattern were discussed in association with scleroderma.  According to the patient and her sister has a history of morphea as well as Sjogren syndrome. She has no previous or current evidence of sclerodactyly.  She has started to notice symptoms of Raynaud's phenomenon especially in her feet.  No digital ulcerations were noted.  She had good capillary refill on examination today. She has been experiencing increased pain in both hands and the left knee joint.  On examination she has tenderness over the dorsal aspect of her right wrist and has painful range of motion of the left knee joint.  No warmth or effusion of the left knee joint was noted. X-rays of left knee were obtained today.  She declined PT or a cortisone injection at this time.  She continues to experience shortness of breath on exertion.  She was recently evaluated by Dr. Erin Fulling for pulmonary nodulosis and interstitial pattern in the nodules.  No evidence of fibrotic interstitial lung disease was noted in 2022.  Repeat CT 06/29/2022 reviewed.  Former smoker.  Cough improved after stopping ACE inhibitor's.  She has not yet had PFTs performed.  Patient plans on calling Dr. August Albino office today to schedule a follow-up visit to  determine the next steps. It is unclear if she requires immunosuppressive therapy from a pulmonary standpoint.  She is also still undergoing workup with GI-Dr. Loletha Carrow.  She remains on omeprazole as prescribed with refractory reflux symptoms.  The patient's symptoms of dysphagia are felt to be due to dysmotility related to reflux.  She is scheduled for esophageal manometry on 01/02/23.  She will follow up in 2 months or sooner if needed once we have more information pending further testing.    H/O multiple pulmonary nodules - She had high-resolution CT on 05/17/2021 which showed subpleural nodule of the anterior left upper lobe.  Chest CT updated 06/27/2022: New 5 mm part-solid pulmonary nodule within the left upper lobe.  Additional bilateral pulmonary nodules are stable. Patient has not yet had pulmonary function testing.  She plans on reaching out to Dr. August Albino office today to schedule a follow-up visit for further evaluation and to discuss the neck steps.  History of gastroesophageal reflux (GERD) -Followed by Dr. Loletha Carrow.  She remains on omeprazole 40 mg  daily.  Reflux refractory to treatment.  Dysphagia felt to be due to dysmotility secondary to reflux.  Scheduled for esophageal manometry on 01/02/23.    Elevated sed rate: Sed rate was 66 on 07/10/2022.  ESR was rechecked on 07/19/2022 and was 28-WNL.   Chronic right shoulder pain - Shoulder joint x-ray on June 04, 2022 which showed mild degenerative changes.  She had a right AC joint injection on 05/04/2022.  Trochanteric bursitis of both hips: She has tenderness palpation over bilateral trochanteric bursa.  Discussed the importance of performing stretching exercises daily.  Primary osteoarthritis of both knees - Right knee joint arthroscopic surgery by Dr. Durward Fortes.   Chronic pain of left knee -She presents today with increased pain in the left knee joint.  No recent injury or fall.  No mechanical symptoms.  She has noticed intermittent swelling in  the left knee.  On examination she has good range of motion with no warmth or effusion.  X-rays of the left knee were obtained today for further evaluation.  She declined a left knee joint cortisone injection due to inadequate response in the past.  She declined a referral to physical therapy.  Plan: XR KNEE 3 VIEW LEFT  Fibromyalgia - Positive tender points on exam.  She continues to experience interval myalgias and muscle tenderness due to fibromyalgia.  She takes tramadol 50 mg 1 tablet twice daily as needed for pain relief and Tylenol for breakthrough symptoms.  She has been taking tizanidine 4 mg at bedtime as needed for muscle spasms and remains on Ambien 5 mg at bedtime for insomnia.  Other fatigue: Chronic, stable.   Primary insomnia - She continues to take Ambien 5 mg by mouth nightly as needed for insomnia.   Other medical conditions are listed as follows:   History of diabetes mellitus  Vitamin D deficiency  History of anemia  Family history of morphea-Sister  Orders: Orders Placed This Encounter  Procedures   XR KNEE 3 VIEW LEFT   No orders of the defined types were placed in this encounter.    Follow-Up Instructions: Return in about 2 months (around 11/26/2022) for Fibromyalgia, Osteoarthritis.   Ofilia Neas, PA-C  Note - This record has been created using Dragon software.  Chart creation errors have been sought, but may not always  have been located. Such creation errors do not reflect on  the standard of medical care.

## 2022-09-24 ENCOUNTER — Other Ambulatory Visit: Payer: Self-pay

## 2022-09-24 ENCOUNTER — Other Ambulatory Visit: Payer: Self-pay | Admitting: Gastroenterology

## 2022-09-24 ENCOUNTER — Encounter: Payer: Self-pay | Admitting: Gastroenterology

## 2022-09-24 ENCOUNTER — Ambulatory Visit: Payer: Medicare HMO | Admitting: Gastroenterology

## 2022-09-24 VITALS — BP 130/98 | HR 113 | Ht 66.0 in | Wt 214.1 lb

## 2022-09-24 DIAGNOSIS — R1013 Epigastric pain: Secondary | ICD-10-CM | POA: Diagnosis not present

## 2022-09-24 DIAGNOSIS — K219 Gastro-esophageal reflux disease without esophagitis: Secondary | ICD-10-CM | POA: Diagnosis not present

## 2022-09-24 DIAGNOSIS — R1319 Other dysphagia: Secondary | ICD-10-CM

## 2022-09-24 MED ORDER — OMEPRAZOLE 20 MG PO CPDR: 40.0000 mg | DELAYED_RELEASE_CAPSULE | Freq: Every day | ORAL | 1 refills | Status: DC

## 2022-09-24 MED ORDER — METOCLOPRAMIDE HCL 5 MG PO TABS: 5.0000 mg | ORAL_TABLET | Freq: Three times a day (TID) | ORAL | 2 refills | Status: DC | PRN

## 2022-09-24 NOTE — Progress Notes (Signed)
[facility] Progress Note  Chief Complaint: GERD  Subjective  History: Jaquilla was last seen in the office March 2022 for longstanding nonulcer dyspepsia persisting despite treatment and clearance of H. pylori.  She had burning epigastric discomfort with nausea.  When I last saw her, she was troubled by nearly a year of chronic cough for which I referred her to pulmonary clinic.  She was also scheduled for an upper GI series to look for obvious reflux as well as a gastric emptying study.The UGIS demonstrated reflux and nonspecific esophageal dysmotility, though it was difficult to know how much that was cause or effect of her unrelenting chronic cough.  GES demonstrated mild delay after 1 hour which normalized for the remainder of the study (not consistent with gastroparesis). Because ondansetron was not working well to control nausea, she was given a trial of metoclopramide in May 2022.  She has continued to use this from time to time, perhaps once or twice a week. Last EGD July 2021.  Last pulmonary clinic office note from July 2023 by Dr. Erin Fulling indicates the chronic cough significantly improved after stopping ACE inhibitor but was persistent and thought to be related to reactive airway and reflux.  She also had some autoantibodies and nodular changes on CT chest and was referred to rheumatology.  Rheumatology clinic note from August was reviewed.  ESR was also elevated soon after treatment for pulmonary infection, then it normalized. ____________________   Jenny Reichmann tells me she has ongoing reflux symptoms with regurgitation and pyrosis as well as intermittent solid and liquid dysphagia despite adhering to an antireflux diet and lifestyle as well as taking acid suppression medicine. She has also had ongoing dyspeptic symptoms, but was glad to report that the cough almost completely resolved after stopping her ACE inhibitor.  ROS: Cardiovascular:  no chest pain Respiratory: no  dyspnea Remainder systems negative except as above The patient's Past Medical, Family and Social History were reviewed and are on file in the EMR. Past Medical History:  Diagnosis Date   ADD (attention deficit disorder with hyperactivity)    Allergic rhinitis    Anxiety    Arthritis    Depression    Dysmenorrhea    Fibromyalgia    GERD (gastroesophageal reflux disease)    H. pylori infection    per patient    HLD (hyperlipidemia)    HTN (hypertension)    Insomnia    Irritable bowel syndrome    Restless leg syndrome    Sleep apnea     Objective:  Med list reviewed  Current Outpatient Medications:    albuterol (VENTOLIN HFA) 108 (90 Base) MCG/ACT inhaler, Inhale 2 puffs into the lungs in the morning, at noon, in the evening, and at bedtime., Disp: , Rfl:    amLODipine (NORVASC) 10 MG tablet, Take 10 mg by mouth daily., Disp: , Rfl:    amphetamine-dextroamphetamine (ADDERALL) 10 MG tablet, Take 10 mg by mouth daily with breakfast., Disp: , Rfl:    Blood Glucose Monitoring Suppl (ACCU-CHEK GUIDE ME) w/Device KIT, See admin instructions., Disp: , Rfl:    buPROPion (WELLBUTRIN XL) 150 MG 24 hr tablet, Take 150 mg by mouth every morning., Disp: , Rfl:    diclofenac Sodium (VOLTAREN) 1 % GEL, APPLY 2-4 GRAMS TO AFFECTED JOINT 4 TIMES DAILY AS NEEDED., Disp: 400 g, Rfl: 2   escitalopram (LEXAPRO) 20 MG tablet, Take 20 mg by mouth at bedtime. , Disp: , Rfl:    fluticasone (FLONASE) 50  MCG/ACT nasal spray, SPRAY 1 SPRAY INTO BOTH NOSTRILS DAILY., Disp: 16 mL, Rfl: 2   fluticasone (VERAMYST) 27.5 MCG/SPRAY nasal spray, Place 2 sprays into the nose daily., Disp: , Rfl:    gabapentin (NEURONTIN) 300 MG capsule, Take 3 capsules by mouth at bedtime., Disp: , Rfl:    ipratropium (ATROVENT) 0.03 % nasal spray, Place 2 sprays into both nostrils every 12 (twelve) hours as needed for rhinitis., Disp: , Rfl:    metFORMIN (GLUCOPHAGE-XR) 500 MG 24 hr tablet, Take 2 tablets by mouth 2 (two) times  daily., Disp: , Rfl:    naproxen (NAPROSYN) 500 MG tablet, Take 500 mg by mouth as needed., Disp: , Rfl:    omeprazole (PRILOSEC) 20 MG capsule, TAKE 2 CAPSULES BY MOUTH EVERY DAY, Disp: 180 capsule, Rfl: 0   ONETOUCH VERIO test strip, 1 each daily., Disp: , Rfl:    OZEMPIC, 0.25 OR 0.5 MG/DOSE, 2 MG/1.5ML SOPN, Inject 0.25 mg into the skin at bedtime. , Disp: , Rfl:    PRAVASTATIN SODIUM PO, Take 1 tablet by mouth daily. , Disp: , Rfl:    SHINGRIX injection, , Disp: , Rfl:    tiZANidine (ZANAFLEX) 4 MG tablet, TAKE 1 TABLET (4 MG TOTAL) BY MOUTH EVERY 8 (EIGHT) HOURS AS NEEDED FOR MUSCLE SPASMS, Disp: 30 tablet, Rfl: 0   traMADol (ULTRAM) 50 MG tablet, Take 50 mg by mouth 2 (two) times daily., Disp: , Rfl:    zolpidem (AMBIEN) 5 MG tablet, TAKE 1 TABLET (5 MG TOTAL) BY MOUTH AT BEDTIME AS NEEDED FOR SLEEP., Disp: 30 tablet, Rfl: 0   Vital signs in last 24 hrs: Vitals:   09/24/22 0822  BP: (!) 130/98  Pulse: (!) 113   Wt Readings from Last 3 Encounters:  09/24/22 214 lb 2 oz (97.1 kg)  07/19/22 212 lb 3.2 oz (96.3 kg)  07/10/22 211 lb (95.7 kg)    Physical Exam  Well-appearing, normal vocal quality HEENT: sclera anicteric, oral mucosa moist without lesions Neck: supple, no thyromegaly, JVD or lymphadenopathy Cardiac: Regular without murmur,  no peripheral edema Pulm: clear to auscultation bilaterally, normal RR and effort noted Abdomen: soft, epigastric tenderness, with active bowel sounds. No guarding or palpable hepatosplenomegaly. Skin; warm and dry, no jaundice or rash  Labs:   ___________________________________________ Radiologic studies:  CLINICAL DATA:  Nausea. Vomiting. Abdominal pain for 1 year. Cough. Rule out gastroesophageal reflux disease.   EXAM: UPPER GI SERIES WITH KUB   TECHNIQUE: After obtaining a scout radiograph a routine upper GI series was performed using thin and high density barium.   FLUOROSCOPY TIME:  Fluoroscopy Time:  3 minutes 36  seconds   Radiation Exposure Index (if provided by the fluoroscopic device): 99.8 mGy   Number of Acquired Spot Images: 0   COMPARISON:  Abdominopelvic CT of 07/28/2020.   FINDINGS: Preprocedure scout film demonstrates a moderate amount of ascending colonic stool.   Double contrast evaluation of the esophagus demonstrates no mucosal abnormality.   Double contrast evaluation of the stomach demonstrates no mass, ulcer, inflammation.   Prompt passage of contrast into the duodenal bulb and C-loop, which are normal.   Evaluation of esophageal peristalsis demonstrates an incomplete distal primary peristaltic wave with proximal escape waves and contrast stasis in the lower esophagus.   Full column evaluation of the esophagus demonstrates no persistent narrowing or stricture.   With water swallows in a supine position, there is gastroesophageal reflux to the upper to midthoracic esophagus.   A 13 mm  barium tablet passes promptly.   No spontaneous gastroesophageal reflux on upright positioning identified.   IMPRESSION: Mild esophageal dysmotility, likely presbyesophagus.   Gastroesophageal reflux with supine positioning.     Electronically Signed   By: Abigail Miyamoto M.D.   On: 03/10/2021 10:35 ______________________________  CLINICAL DATA:  Nausea, vomiting, early satiety, dysphagia, bloating, abdominal pain, appetite loss and weight loss symptoms for several years worsened in past year.   EXAM: NUCLEAR MEDICINE GASTRIC EMPTYING SCAN   TECHNIQUE: After oral ingestion of radiolabeled meal, sequential abdominal images were obtained for 4 hours. Percentage of activity emptying the stomach was calculated at 1 hour, 2 hour, 3 hour, and 4 hours.   RADIOPHARMACEUTICALS:  1.9 mCi Tc-21m sulfur colloid none in standardized meal   COMPARISON:  None   FINDINGS: Expected location of the stomach in the left upper quadrant.   Ingested meal empties the stomach gradually  over the course of the study.   20% emptied at 1 hr ( normal >= 10%)   41% emptied at 2 hr ( normal >= 40%)   62% emptied at 3 hr ( normal >= 70%)   83% emptied at 4 hr ( normal >= 90%)   IMPRESSION: Mildly delayed gastric emptying study.     Electronically Signed   By: Lavonia Dana M.D.   On: 03/23/2021 16:34 ______________________________   CLINICAL DATA:  History of lung nodule.   EXAM: CT CHEST WITHOUT CONTRAST   TECHNIQUE: Multidetector CT imaging of the chest was performed following the standard protocol without IV contrast.   RADIATION DOSE REDUCTION: This exam was performed according to the departmental dose-optimization program which includes automated exposure control, adjustment of the mA and/or kV according to patient size and/or use of iterative reconstruction technique.   COMPARISON:  Chest CT 05/17/2021   FINDINGS: Cardiovascular: Normal heart size. No pericardial effusion. Aorta and main pulmonary artery normal in caliber.   Mediastinum/Nodes: No enlarged axillary, mediastinal or hilar lymphadenopathy. Esophagus is unremarkable.   Lungs/Pleura: Central airways are patent. Stable 4 mm subpleural right upper lobe nodule (image 23; series 5). Stable 4 mm subpleural right upper lobe nodule (image 49; series 5). Stable 3 mm right upper lobe nodule (image 55; series 5). Stable 3 mm subpleural right upper lobe nodule (image 59; series 5). Stable 4 mm subpleural left upper lobe nodule (image 50; series 5). Stable 3 mm subpleural left lower lobe nodule (image 86; series 5). There is a new 5 mm subsolid left upper lobe nodule (image 41; series 5).   Upper Abdomen: Liver is diffusely low in attenuation compatible with steatosis. No acute process.   Musculoskeletal: Thoracic spine degenerative changes.   IMPRESSION: 1. New 5 mm part-solid pulmonary nodule within the left upper lobe. Per Fleischner Society Guidelines, no routine follow-up imaging  is recommended. These guidelines do not apply to immunocompromised patients and patients with cancer. Follow up in patients with significant comorbidities as clinically warranted. For lung cancer screening, adhere to Lung-RADS guidelines. Reference: Radiology. 2017; 284(1):228-43. 2. Additional bilateral pulmonary nodules are stable measuring up to 5 mm. Consider additional follow-up in 12 months.     Electronically Signed   By: Lovey Newcomer M.D.   On: 06/29/2022 09:04     ____________________________________________ Other:   _____________________________________________ Assessment & Plan  Assessment: Encounter Diagnoses  Name Primary?   Gastroesophageal reflux disease without esophagitis Yes   Esophageal dysphagia    Epigastric pain    Nonulcer dyspepsia  Reflux symptoms that persist despite  adherence to medicines and antireflux diet and lifestyle.  Given previous endoscopic and barium study findings, I believe the dysphagia is dysmotility related to reflux.  We discussed the need for further testing to see if she might benefit from an be a candidate for more definitive antireflux treatment such as a TIF or surgical fundoplication.  Plan: Esophageal pH/impedance and manometry testing scheduled.  Sees while on PPI) Continue current management in the meantime   Nelida Meuse III

## 2022-09-24 NOTE — Patient Instructions (Signed)
_______________________________________________________  If you are age 53 or older, your body mass index should be between 23-30. Your Body mass index is 34.56 kg/m. If this is out of the aforementioned range listed, please consider follow up with your Primary Care Provider.  If you are age 92 or younger, your body mass index should be between 19-25. Your Body mass index is 34.56 kg/m. If this is out of the aformentioned range listed, please consider follow up with your Primary Care Provider.   ________________________________________________________  The [facility] providers would like to encourage you to use [facility] to communicate with providers for non-urgent requests or questions.  Due to long hold times on the telephone, sending your provider a message by [facility] may be a faster and more efficient way to get a response.  Please allow 48 business hours for a response.  Please remember that this is for non-urgent requests.  _______________________________________________________  [name] have been scheduled for an esophageal manometry at [facility] on 01-02-2023 at 10:30am. Please arrive 30 minutes prior to your procedure for registration. You will need to go to outpatient registration (1st floor of the hospital) first. Make certain to bring your insurance cards as well as a complete list of medications.  Please remember the following:  1) Do not take any muscle relaxants, xanax (alprazolam) or ativan for 1 day prior to your test as well as the day of the test.  2) Nothing to eat or drink for 8 hours before your test.  3) Hold all diabetic medications/insulin the morning of the test. You may eat and take your medications after the test.  It will take at least 2 weeks to receive the results of this test from your physician. ------------------------------------------ ABOUT ESOPHAGEAL MANOMETRY Esophageal manometry (muh-NOM-uh-tree) is a test that gauges how well your esophagus  works. Your esophagus is the long, muscular tube that connects your throat to your stomach. Esophageal manometry measures the rhythmic muscle contractions (peristalsis) that occur in your esophagus when you swallow. Esophageal manometry also measures the coordination and force exerted by the muscles of your esophagus.  During esophageal manometry, a thin, flexible tube (catheter) that contains sensors is passed through your nose, down your esophagus and into your stomach. Esophageal manometry can be helpful in diagnosing some mostly uncommon disorders that affect your esophagus.  Why it's done Esophageal manometry is used to evaluate the movement (motility) of food through the esophagus and into the stomach. The test measures how well the circular bands of muscle (sphincters) at the top and bottom of your esophagus open and close, as well as the pressure, strength and pattern of the wave of esophageal muscle contractions that moves food along.  What you can expect Esophageal manometry is an outpatient procedure done without sedation. Most people tolerate it well. You may be asked to change into a hospital gown before the test starts.  During esophageal manometry  While you are sitting up, a member of your health care team sprays your throat with a numbing medication or puts numbing gel in your nose or both.  A catheter is guided through your nose into your esophagus. The catheter may be sheathed in a water-filled sleeve. It doesn't interfere with your breathing. However, your eyes may water, and you may gag. You may have a slight nosebleed from irritation.  After the catheter is in place, you may be asked to lie on your back on an exam table, or you may be asked to remain seated.  You  then swallow small sips of water. As you do, a computer connected to the catheter records the pressure, strength and pattern of your esophageal muscle contractions.  During the test, you'll be asked to breathe slowly and  smoothly, remain as still as possible, and swallow only when you're asked to do so.  A member of your health care team may move the catheter down into your stomach while the catheter continues its measurements.  The catheter then is slowly withdrawn. The test usually lasts 20 to 30 minutes.  After esophageal manometry  When your esophageal manometry is complete, you may return to your normal activities  This test typically takes 30-45 minutes to complete. ________________________________________________________________________________  It was a pleasure to see you today!  Thank you for trusting me with your gastrointestinal care!

## 2022-09-26 ENCOUNTER — Ambulatory Visit (INDEPENDENT_AMBULATORY_CARE_PROVIDER_SITE_OTHER): Payer: Medicare HMO

## 2022-09-26 ENCOUNTER — Ambulatory Visit: Payer: Medicare HMO | Attending: Rheumatology | Admitting: Physician Assistant

## 2022-09-26 ENCOUNTER — Encounter: Payer: Self-pay | Admitting: Physician Assistant

## 2022-09-26 ENCOUNTER — Encounter: Payer: Self-pay | Admitting: Pulmonary Disease

## 2022-09-26 VITALS — BP 155/101 | HR 108 | Resp 16 | Ht 66.0 in | Wt 212.4 lb

## 2022-09-26 DIAGNOSIS — R768 Other specified abnormal immunological findings in serum: Secondary | ICD-10-CM

## 2022-09-26 DIAGNOSIS — R7689 Other specified abnormal immunological findings in serum: Secondary | ICD-10-CM

## 2022-09-26 DIAGNOSIS — Z8639 Personal history of other endocrine, nutritional and metabolic disease: Secondary | ICD-10-CM

## 2022-09-26 DIAGNOSIS — R700 Elevated erythrocyte sedimentation rate: Secondary | ICD-10-CM

## 2022-09-26 DIAGNOSIS — M7062 Trochanteric bursitis, left hip: Secondary | ICD-10-CM

## 2022-09-26 DIAGNOSIS — M797 Fibromyalgia: Secondary | ICD-10-CM | POA: Diagnosis not present

## 2022-09-26 DIAGNOSIS — G8929 Other chronic pain: Secondary | ICD-10-CM | POA: Diagnosis not present

## 2022-09-26 DIAGNOSIS — R5383 Other fatigue: Secondary | ICD-10-CM

## 2022-09-26 DIAGNOSIS — M25511 Pain in right shoulder: Secondary | ICD-10-CM | POA: Diagnosis not present

## 2022-09-26 DIAGNOSIS — J4530 Mild persistent asthma, uncomplicated: Secondary | ICD-10-CM

## 2022-09-26 DIAGNOSIS — M170 Bilateral primary osteoarthritis of knee: Secondary | ICD-10-CM | POA: Diagnosis not present

## 2022-09-26 DIAGNOSIS — M25562 Pain in left knee: Secondary | ICD-10-CM

## 2022-09-26 DIAGNOSIS — Z862 Personal history of diseases of the blood and blood-forming organs and certain disorders involving the immune mechanism: Secondary | ICD-10-CM

## 2022-09-26 DIAGNOSIS — E559 Vitamin D deficiency, unspecified: Secondary | ICD-10-CM

## 2022-09-26 DIAGNOSIS — Z87898 Personal history of other specified conditions: Secondary | ICD-10-CM

## 2022-09-26 DIAGNOSIS — Z8719 Personal history of other diseases of the digestive system: Secondary | ICD-10-CM

## 2022-09-26 DIAGNOSIS — M7061 Trochanteric bursitis, right hip: Secondary | ICD-10-CM

## 2022-09-26 DIAGNOSIS — Z840 Family history of diseases of the skin and subcutaneous tissue: Secondary | ICD-10-CM

## 2022-09-26 DIAGNOSIS — F5101 Primary insomnia: Secondary | ICD-10-CM

## 2022-09-26 NOTE — Progress Notes (Signed)
X-rays of the left knee are unremarkable. Please notify the patient.

## 2022-09-26 NOTE — Patient Instructions (Signed)

## 2022-10-08 ENCOUNTER — Telehealth: Payer: Self-pay

## 2022-10-08 NOTE — Patient Instructions (Signed)
Visit Information  Thank you for taking time to visit with me today. Please don't hesitate to contact me if I can be of assistance to you.   Following are the goals we discussed today:   Goals Addressed             This Visit's Progress    Diabetes Management       Care Coordination Interventions: Provided education to patient about basic DM disease process Discussed plans with patient for ongoing care management follow up and provided patient with direct contact information for care management team Blood sugars running around 99.           Our next appointment is by telephone on 01/07/23 at 0930   Please call the care guide team at [phone] if you need to cancel or reschedule your appointment.   If you are experiencing a Mental Health or [place] or need someone to talk to, please call the Suicide and Crisis Lifeline: 988   Patient verbalizes understanding of instructions and care plan provided today and agrees to view in [place]. Active MyChart status and patient understanding of how to access instructions and care plan via MyChart confirmed with patient.     Telephone follow up appointment with care management team member scheduled for: January  Ariadna Setter J Glanda Spanbauer, RN, MSN [place] Care Management Coordinator Direct Line [phone]

## 2022-10-08 NOTE — Patient Outreach (Signed)
  Care Coordination   Follow Up Visit Note   10/08/2022 Name: [name] MRN: [id number] DOB: 10-14-1971  [name] is a 51 y.o. year old female who sees Donald Prose, MD for primary care. I spoke with  [name] by phone today.  What matters to the patients health and wellness today?  none    Goals Addressed             This Visit's Progress    Diabetes Management       Care Coordination Interventions: Provided education to patient about basic DM disease process Discussed plans with patient for ongoing care management follow up and provided patient with direct contact information for care management team Blood sugars running around 99.           SDOH assessments and interventions completed:  Yes     Care Coordination Interventions Activated:  Yes  Care Coordination Interventions:  Yes, provided   Follow up plan: Follow up call scheduled for January    Encounter Outcome:  Pt. Visit Completed   [name], RN, MSN [place] Management Care Management Coordinator Direct Line [phone]

## 2022-10-09 ENCOUNTER — Other Ambulatory Visit: Payer: Self-pay | Admitting: Surgical

## 2022-10-16 DIAGNOSIS — F330 Major depressive disorder, recurrent, mild: Secondary | ICD-10-CM | POA: Diagnosis not present

## 2022-10-23 ENCOUNTER — Ambulatory Visit: Payer: Medicare HMO | Admitting: Physician Assistant

## 2022-11-02 ENCOUNTER — Other Ambulatory Visit: Payer: Self-pay | Admitting: Physician Assistant

## 2022-11-02 NOTE — Telephone Encounter (Signed)
Next Visit: 11/28/2022  Last Visit: 09/26/2022  Last Fill: 01/29/2022  Dx: Chronic pain of left knee   Current Dose per office note on 09/26/2022: not discussed  Okay to refill Voltaren Gel?

## 2022-11-07 ENCOUNTER — Encounter: Payer: Self-pay | Admitting: Pulmonary Disease

## 2022-11-15 NOTE — Progress Notes (Deleted)
Office Visit Note  Patient: [name]             Date of Birth: 05/30/1971           MRN: [id number]             PCP: Donald Prose, MD Referring: Donald Prose, MD Visit Date: 11/28/2022 Occupation: '@GUAROCC'$ @  Subjective:    History of Present Illness: [name] is a 51 y.o. female with history of positive ANA, osteoarthritis, and fibromyalgia.  Seeing Dr. Erin Fulling on 12/19/21   Activities of Daily Living:  Patient reports morning stiffness for *** {minute/hour:19697}.   Patient {ACTIONS;DENIES/REPORTS:21021675::"Denies"} nocturnal pain.  Difficulty dressing/grooming: {ACTIONS;DENIES/REPORTS:21021675::"Denies"} Difficulty climbing stairs: {ACTIONS;DENIES/REPORTS:21021675::"Denies"} Difficulty getting out of chair: {ACTIONS;DENIES/REPORTS:21021675::"Denies"} Difficulty using hands for taps, buttons, cutlery, and/or writing: {ACTIONS;DENIES/REPORTS:21021675::"Denies"}  No Rheumatology ROS completed.   PMFS History:  Patient Active Problem List   Diagnosis Date Noted   Fibromyalgia 12/25/2016   Other fatigue 12/25/2016   Primary insomnia 12/25/2016   Primary osteoarthritis of both knees 12/25/2016   ANA positive 12/25/2016   History of gastroesophageal reflux (GERD) 12/25/2016   History of anemia 12/25/2016   Trochanteric bursitis of both hips 12/25/2016   Anemia, iron deficiency 04/18/2015   Dysphagia, pharyngoesophageal phase 11/14/2011   Esophageal reflux 11/14/2011    Past Medical History:  Diagnosis Date   ADD (attention deficit disorder with hyperactivity)    Allergic rhinitis    Anxiety    Arthritis    Depression    Dysmenorrhea    Fibromyalgia    GERD (gastroesophageal reflux disease)    H. pylori infection    per patient    HLD (hyperlipidemia)    HTN (hypertension)    Insomnia    Irritable bowel syndrome    Restless leg syndrome    Sleep apnea     Family History  Problem Relation Age of Onset   Breast cancer Maternal  Grandmother    Diabetes Sister    Irritable bowel syndrome Sister    Heart disease Brother    Heart attack Brother    Stroke Mother    Heart disease Mother    Diabetes Sister    Irritable bowel syndrome Sister    Fibromyalgia Daughter    Colon cancer Neg Hx    Colon polyps Neg Hx    Esophageal cancer Neg Hx    Kidney disease Neg Hx    Gallbladder disease Neg Hx    Stomach cancer Neg Hx    Past Surgical History:  Procedure Laterality Date   BUNIONECTOMY Left 2008   DILATION AND CURETTAGE OF UTERUS  2004   EAR CYST EXCISION Left 1983   KNEE ARTHROSCOPY Right    with knee cap adjustment   UPPER GASTROINTESTINAL ENDOSCOPY  06/29/2020   Social History   Social History Narrative   Not on file   Immunization History  Administered Date(s) Administered   Influenza Split 09/01/2014, 09/26/2016, 10/03/2018   Influenza, Quadrivalent, Recombinant, Inj, Pf 09/21/2020, 09/12/2021   Influenza,inj,Quad PF,6+ Mos 10/03/2018   Influenza-Unspecified 09/12/2021   PFIZER(Purple Top)SARS-COV-2 Vaccination 03/11/2020, 04/01/2020, 12/08/2020   PNEUMOCOCCAL CONJUGATE-20 05/04/2022   Zoster, Live 05/04/2022     Objective: Vital Signs: There were no vitals taken for this visit.   Physical Exam Vitals and nursing note reviewed.  Constitutional:      Appearance: She is well-developed.  HENT:     Head: Normocephalic and atraumatic.  Eyes:     Conjunctiva/sclera: Conjunctivae normal.  Cardiovascular:  Rate and Rhythm: Normal rate and regular rhythm.     Heart sounds: Normal heart sounds.  Pulmonary:     Effort: Pulmonary effort is normal.     Breath sounds: Normal breath sounds.  Abdominal:     General: Bowel sounds are normal.     Palpations: Abdomen is soft.  Musculoskeletal:     Cervical back: Normal range of motion.  Skin:    General: Skin is warm and dry.     Capillary Refill: Capillary refill takes less than 2 seconds.  Neurological:     Mental Status: She is alert and  oriented to person, place, and time.  Psychiatric:        Behavior: Behavior normal.      Musculoskeletal Exam: ***  CDAI Exam: CDAI Score: -- Patient Global: --; Provider Global: -- Swollen: --; Tender: -- Joint Exam 11/28/2022   No joint exam has been documented for this visit   There is currently no information documented on the homunculus. Go to the Rheumatology activity and complete the homunculus joint exam.  Investigation: No additional findings.  Imaging: No results found.  Recent Labs: Lab Results  Component Value Date   WBC 5.9 07/01/2022   HGB 12.0 07/01/2022   PLT 316 07/01/2022   NA 140 07/01/2022   K 3.3 (L) 07/01/2022   CL 107 07/01/2022   CO2 24 07/01/2022   GLUCOSE 101 (H) 07/01/2022   BUN 9 07/01/2022   CREATININE 0.74 07/01/2022   BILITOT 0.3 06/29/2020   ALKPHOS 114 06/29/2020   AST 12 06/29/2020   ALT 14 06/29/2020   PROT 7.9 07/19/2022   ALBUMIN 4.1 06/29/2020   CALCIUM 8.7 (L) 07/01/2022   GFRAA >60 12/28/2019    Speciality Comments: No specialty comments available.  Procedures:  No procedures performed Allergies: Lisinopril, Methylphenidate, and Prednisone   Assessment / Plan:     Visit Diagnoses: No diagnosis found.  Orders: No orders of the defined types were placed in this encounter.  No orders of the defined types were placed in this encounter.   Face-to-face time spent with patient was *** minutes. Greater than 50% of time was spent in counseling and coordination of care.  Follow-Up Instructions: No follow-ups on file.   Earnestine Mealing, CMA  Note - This record has been created using Editor, commissioning.  Chart creation errors have been sought, but may not always  have been located. Such creation errors do not reflect on  the standard of medical care.

## 2022-11-16 ENCOUNTER — Other Ambulatory Visit: Payer: Self-pay | Admitting: Surgical

## 2022-11-26 ENCOUNTER — Ambulatory Visit: Payer: Medicare HMO | Admitting: Pulmonary Disease

## 2022-11-28 ENCOUNTER — Ambulatory Visit: Payer: Medicare HMO | Admitting: Physician Assistant

## 2022-11-28 DIAGNOSIS — R700 Elevated erythrocyte sedimentation rate: Secondary | ICD-10-CM

## 2022-11-28 DIAGNOSIS — R768 Other specified abnormal immunological findings in serum: Secondary | ICD-10-CM

## 2022-11-28 DIAGNOSIS — Z840 Family history of diseases of the skin and subcutaneous tissue: Secondary | ICD-10-CM

## 2022-11-28 DIAGNOSIS — Z87898 Personal history of other specified conditions: Secondary | ICD-10-CM

## 2022-11-28 DIAGNOSIS — M7061 Trochanteric bursitis, right hip: Secondary | ICD-10-CM

## 2022-11-28 DIAGNOSIS — Z8639 Personal history of other endocrine, nutritional and metabolic disease: Secondary | ICD-10-CM

## 2022-11-28 DIAGNOSIS — M797 Fibromyalgia: Secondary | ICD-10-CM

## 2022-11-28 DIAGNOSIS — F5101 Primary insomnia: Secondary | ICD-10-CM

## 2022-11-28 DIAGNOSIS — M170 Bilateral primary osteoarthritis of knee: Secondary | ICD-10-CM

## 2022-11-28 DIAGNOSIS — Z862 Personal history of diseases of the blood and blood-forming organs and certain disorders involving the immune mechanism: Secondary | ICD-10-CM

## 2022-11-28 DIAGNOSIS — R5383 Other fatigue: Secondary | ICD-10-CM

## 2022-11-28 DIAGNOSIS — G8929 Other chronic pain: Secondary | ICD-10-CM

## 2022-11-28 DIAGNOSIS — E559 Vitamin D deficiency, unspecified: Secondary | ICD-10-CM

## 2022-11-28 DIAGNOSIS — Z8719 Personal history of other diseases of the digestive system: Secondary | ICD-10-CM

## 2022-12-19 ENCOUNTER — Ambulatory Visit: Payer: Medicare HMO | Admitting: Pulmonary Disease

## 2022-12-19 ENCOUNTER — Other Ambulatory Visit: Payer: Self-pay | Admitting: Surgical

## 2022-12-19 NOTE — Progress Notes (Deleted)
Synopsis: Referred in May 2022 for chronic cough by Wray Kearns, MD  Subjective:   PATIENT ID: [name] GENDER: female DOB: 1971-01-18, MRN: [id number]  HPI  No chief complaint on file.  [name] is a 52 year old woman, former smoker with history of GERD, dysphagia, and fibromyalgia returns to pulmonary clinic for chronic cough.  She has been doing well since last visit. She continues to have mild intermittent dry cough. She has exertional dyspnea walking around her home. Denies wheezing.   She continues to have diffuse joint pains. She reports her sister has an autoimmune condition that she is on medication and another sister who has fibromyalgia.   We reviewed her CT Chest scan where the subcentimeter pulmonary nodules are stable and a new LUL groundglass nodule measuring 75m.   OV 09/12/21 She reports her cough is much improved after stopping lisinopril on 06/2021 after seeing TRexene Edison NP. She continues to have intermittent cough that is sporadic. She has no night time awakenings from the cough. She denies sputum production. She continues to have on-going reflux symptoms despite 430mprilosec daily and sleeping on a wedge pillow. Her GERD symptoms are worse in the morning time. She also has intermittent sinus congestion. She is using flonase daily and ipratropium nasal spray as needed. She uses albuterol inhaler as needed for the cough which she reports was mainly from summer allergies.   OV 06/12/21 She reports she is still coughing up blood-tinged sputum.  She is also blowing her nose with bloody secretions.  She has noted that her sinuses have been draining a lot these past few weeks.  She continues to deny any shortness of breath or wheezing with the cough.  HRCT chest on 05/17/2021 shows unremarkable parenchyma and airways.  No nodules or effusions noted.  OV 05/09/21 She reports having cough since last March.  She has been coughing up clear mucus  until recently she has noted blood-tinged sputum.  She describes coughing episodes with posttussive emesis.  She has tried using albuterol and cough syrup without relief of her cough.  She does report shortness of breath and chest tightness with the cough.  She denies wheezing.  She is followed by GI for GERD and ongoing symptoms of nausea and vomiting.  She does report history of dysphagia requiring esophageal dilation.  She had EGD last year with biopsies showing H. pylori positivity in which she has completed treatment.  She is currently taking omeprazole 20 mg twice daily.  She also sleeps with the head of her bed elevated at night.  She denies any epistaxis but she reports a sore in her left nasal passage that can lead to blood-tinged nasal secretions and irritation.   She quit smoking in 1995 and has about a 12 pack year smoking history.  She denies history of blood clots.  Past Medical History:  Diagnosis Date   ADD (attention deficit disorder with hyperactivity)    Allergic rhinitis    Anxiety    Arthritis    Depression    Dysmenorrhea    Fibromyalgia    GERD (gastroesophageal reflux disease)    H. pylori infection    per patient    HLD (hyperlipidemia)    HTN (hypertension)    Insomnia    Irritable bowel syndrome    Restless leg syndrome    Sleep apnea      Family History  Problem Relation Age of Onset   Breast cancer Maternal Grandmother  Diabetes Sister    Irritable bowel syndrome Sister    Heart disease Brother    Heart attack Brother    Stroke Mother    Heart disease Mother    Diabetes Sister    Irritable bowel syndrome Sister    Fibromyalgia Daughter    Colon cancer Neg Hx    Colon polyps Neg Hx    Esophageal cancer Neg Hx    Kidney disease Neg Hx    Gallbladder disease Neg Hx    Stomach cancer Neg Hx      Social History   Socioeconomic History   Marital status: Widowed    Spouse name: Not on file   Number of children: 1   Years of education: Not  on file   Highest education level: Not on file  Occupational History   Occupation: Disabled  Tobacco Use   Smoking status: Former    Packs/day: 0.10    Years: 12.00    Total pack years: 1.20    Types: Cigarettes    Quit date: 12/27/1993    Years since quitting: 28.9    Passive exposure: Never   Smokeless tobacco: Never  Vaping Use   Vaping Use: Never used  Substance and Sexual Activity   Alcohol use: No   Drug use: No   Sexual activity: Yes  Other Topics Concern   Not on file  Social History Narrative   Not on file   Social Determinants of Health   Financial Resource Strain: Not on file  Food Insecurity: Not on file  Transportation Needs: No Transportation Needs (07/10/2022)   PRAPARE - Hydrologist (Medical): No    Lack of Transportation (Non-Medical): No  Physical Activity: Not on file  Stress: Not on file  Social Connections: Not on file  Intimate Partner Violence: Not on file     Allergies  Allergen Reactions   Lisinopril     Cough    Methylphenidate Other (See Comments)    Other reaction(s): palpitations   Prednisone     Other reaction(s): rapid heartbeat     Outpatient Medications Prior to Visit  Medication Sig Dispense Refill   albuterol (VENTOLIN HFA) 108 (90 Base) MCG/ACT inhaler Inhale 2 puffs into the lungs in the morning, at noon, in the evening, and at bedtime.     amLODipine (NORVASC) 10 MG tablet Take 10 mg by mouth daily.     amphetamine-dextroamphetamine (ADDERALL) 10 MG tablet Take 10 mg by mouth daily with breakfast.     atorvastatin (LIPITOR) 40 MG tablet Take 40 mg by mouth daily.     Blood Glucose Monitoring Suppl (ACCU-CHEK GUIDE ME) w/Device KIT See admin instructions.     buPROPion (WELLBUTRIN XL) 150 MG 24 hr tablet Take 150 mg by mouth every morning.     diclofenac Sodium (VOLTAREN) 1 % GEL APPLY 2-4 GRAMS TO AFFECTED JOINT 4 TIMES DAILY AS NEEDED. 400 g 2   escitalopram (LEXAPRO) 20 MG tablet Take 20 mg by  mouth at bedtime.      fluticasone (FLONASE) 50 MCG/ACT nasal spray SPRAY 1 SPRAY INTO BOTH NOSTRILS DAILY. 16 mL 2   fluticasone (VERAMYST) 27.5 MCG/SPRAY nasal spray Place 2 sprays into the nose daily.     gabapentin (NEURONTIN) 300 MG capsule Take 3 capsules by mouth at bedtime.     ipratropium (ATROVENT) 0.03 % nasal spray Place 2 sprays into both nostrils every 12 (twelve) hours as needed for rhinitis.  metFORMIN (GLUCOPHAGE-XR) 500 MG 24 hr tablet Take 2 tablets by mouth 2 (two) times daily.     metoCLOPramide (REGLAN) 5 MG tablet Take 1 tablet (5 mg total) by mouth every 8 (eight) hours as needed for nausea. 60 tablet 2   naproxen (NAPROSYN) 500 MG tablet Take 500 mg by mouth as needed.     omeprazole (PRILOSEC) 20 MG capsule Take 2 capsules (40 mg total) by mouth daily. 180 capsule 1   ONETOUCH VERIO test strip 1 each daily.     OZEMPIC, 0.25 OR 0.5 MG/DOSE, 2 MG/1.5ML SOPN Inject 0.25 mg into the skin at bedtime.      PRAVASTATIN SODIUM PO Take 1 tablet by mouth daily.  (Patient not taking: Reported on 09/26/2022)     [place] injection  (Patient not taking: Reported on 09/26/2022)     tiZANidine (ZANAFLEX) 4 MG tablet TAKE 1 TABLET (4 MG TOTAL) BY MOUTH EVERY 8 (EIGHT) HOURS AS NEEDED FOR MUSCLE SPASMS 30 tablet 0   traMADol (ULTRAM) 50 MG tablet Take 50 mg by mouth 2 (two) times daily.     zolpidem (AMBIEN) 5 MG tablet TAKE 1 TABLET (5 MG TOTAL) BY MOUTH AT BEDTIME AS NEEDED FOR SLEEP. 30 tablet 0   No facility-administered medications prior to visit.   Review of Systems  Constitutional:  Negative for chills, fever, malaise/fatigue and weight loss.  HENT:  Negative for congestion, nosebleeds, sinus pain and sore throat.   Eyes: Negative.   Respiratory:  Positive for cough. Negative for hemoptysis, sputum production, shortness of breath and wheezing.   Cardiovascular:  Negative for chest pain, palpitations, orthopnea, claudication and leg swelling.  Gastrointestinal:  Positive  for heartburn. Negative for abdominal pain, nausea and vomiting.  Genitourinary: Negative.   Musculoskeletal:  Negative for joint pain and myalgias.  Skin:  Negative for rash.  Neurological:  Negative for weakness.  Endo/Heme/Allergies: Negative.   Psychiatric/Behavioral: Negative.     Objective:   There were no vitals filed for this visit.   Physical Exam Constitutional:      General: She is not in acute distress.    Appearance: She is obese. She is not ill-appearing.  HENT:     Head: Normocephalic and atraumatic.  Eyes:     General: No scleral icterus. Cardiovascular:     Rate and Rhythm: Normal rate and regular rhythm.     Pulses: Normal pulses.     Heart sounds: Normal heart sounds. No murmur heard. Pulmonary:     Effort: Pulmonary effort is normal.     Breath sounds: Normal breath sounds. No wheezing, rhonchi or rales.  Musculoskeletal:     Right lower leg: No edema.     Left lower leg: No edema.  Skin:    General: Skin is warm and dry.  Neurological:     General: No focal deficit present.     Mental Status: She is alert.     CBC    Component Value Date/Time   WBC 5.9 07/01/2022 1246   RBC 4.83 07/01/2022 1246   HGB 12.0 07/01/2022 1246   HCT 37.5 07/01/2022 1246   PLT 316 07/01/2022 1246   MCV 77.6 (L) 07/01/2022 1246   MCH 24.8 (L) 07/01/2022 1246   MCHC 32.0 07/01/2022 1246   RDW 16.5 (H) 07/01/2022 1246   LYMPHSABS 4.3 (H) 06/29/2020 1155   MONOABS 0.6 06/29/2020 1155   EOSABS 0.2 06/29/2020 1155   BASOSABS 0.1 06/29/2020 1155      Latest Ref  Rng & Units 07/01/2022   12:46 PM 06/29/2020   11:55 AM 12/28/2019    4:33 PM  BMP  Glucose 70 - 99 mg/dL 101  191  519   BUN 6 - 20 mg/dL 9  8  9    Creatinine 0.44 - 1.00 mg/dL 0.74  0.78  0.60   Sodium 135 - 145 mmol/L 140  137  137   Potassium 3.5 - 5.1 mmol/L 3.3  3.3  3.9   Chloride 98 - 111 mmol/L 107  101  100   CO2 22 - 32 mmol/L 24  28    Calcium 8.9 - 10.3 mg/dL 8.7  9.2     Chest  imaging: CT Chest 06/27/22 1. New 5 mm part-solid pulmonary nodule within the left upper lobe. Per Fleischner Society Guidelines, no routine follow-up imaging is recommended. These guidelines do not apply to immunocompromised patients and patients with cancer. Follow up in patients with significant comorbidities as clinically warranted. For lung cancer screening, adhere to Lung-RADS guidelines. Reference: Radiology. 2017; 284(1):228-43. 2. Additional bilateral pulmonary nodules are stable measuring up to 5 mm. Consider additional follow-up in 12 months.  CT Chest 05/17/21 Mediastinum/Nodes: No enlarged mediastinal, hilar, or axillary lymph nodes. Thyroid gland, trachea, and esophagus demonstrate no significant findings.   Lungs/Pleura: 4 mm subpleural nodule of the anterior left upper lobe (series 4, image 87). No evidence of fibrotic interstitial lung disease. No significant air trapping on expiratory phase imaging. No pleural effusion or pneumothorax.  CXR 12/28/2019 The heart size and mediastinal contours are within normal limits. Both lungs are clear. Mild thoracic spondylosis. No blunting of the costophrenic angles.  PFT:     No data to display            Assessment & Plan:   No diagnosis found.  Discussion: Muriel Hannold is a 51 year old woman, former smoker with history of GERD, dysphagia, and fibromyalgia who returns to pulmonary clinic for cough.  Her cough was related to ACEi as she had significant improvement in her cough since stopping this medication in July 2022. She continues to have intermittent cough which is related to GERD and intermittent sinus congestion. She is followed by GI for her GERD and is taking 7m prilosec daily and sleeping on a wedge pillow at night. She can continue flonase 2 sprays daily and ipratropium nasal spray as needed for sinus congestion and drainage. We will check pulmonary function tests and autoimmune workup for the  pulmonary nodules and cough. We will also check hypersensitivity panel.  Follow up in 6 months.   JFreda Jackson MD [facility] Office: [phone]   Current Outpatient Medications:    albuterol (VENTOLIN HFA) 108 (90 Base) MCG/ACT inhaler, Inhale 2 puffs into the lungs in the morning, at noon, in the evening, and at bedtime., Disp: , Rfl:    amLODipine (NORVASC) 10 MG tablet, Take 10 mg by mouth daily., Disp: , Rfl:    amphetamine-dextroamphetamine (ADDERALL) 10 MG tablet, Take 10 mg by mouth daily with breakfast., Disp: , Rfl:    atorvastatin (LIPITOR) 40 MG tablet, Take 40 mg by mouth daily., Disp: , Rfl:    Blood Glucose Monitoring Suppl (ACCU-CHEK GUIDE ME) w/Device KIT, See admin instructions., Disp: , Rfl:    buPROPion (WELLBUTRIN XL) 150 MG 24 hr tablet, Take 150 mg by mouth every morning., Disp: , Rfl:    diclofenac Sodium (VOLTAREN) 1 % GEL, APPLY 2-4 GRAMS TO AFFECTED JOINT 4 TIMES  DAILY AS NEEDED., Disp: 400 g, Rfl: 2   escitalopram (LEXAPRO) 20 MG tablet, Take 20 mg by mouth at bedtime. , Disp: , Rfl:    fluticasone (FLONASE) 50 MCG/ACT nasal spray, SPRAY 1 SPRAY INTO BOTH NOSTRILS DAILY., Disp: 16 mL, Rfl: 2   fluticasone (VERAMYST) 27.5 MCG/SPRAY nasal spray, Place 2 sprays into the nose daily., Disp: , Rfl:    gabapentin (NEURONTIN) 300 MG capsule, Take 3 capsules by mouth at bedtime., Disp: , Rfl:    ipratropium (ATROVENT) 0.03 % nasal spray, Place 2 sprays into both nostrils every 12 (twelve) hours as needed for rhinitis., Disp: , Rfl:    metFORMIN (GLUCOPHAGE-XR) 500 MG 24 hr tablet, Take 2 tablets by mouth 2 (two) times daily., Disp: , Rfl:    metoCLOPramide (REGLAN) 5 MG tablet, Take 1 tablet (5 mg total) by mouth every 8 (eight) hours as needed for nausea., Disp: 60 tablet, Rfl: 2   naproxen (NAPROSYN) 500 MG tablet, Take 500 mg by mouth as needed., Disp: , Rfl:    omeprazole (PRILOSEC) 20 MG capsule, Take 2 capsules (40 mg total) by mouth  daily., Disp: 180 capsule, Rfl: 1   ONETOUCH VERIO test strip, 1 each daily., Disp: , Rfl:    OZEMPIC, 0.25 OR 0.5 MG/DOSE, 2 MG/1.5ML SOPN, Inject 0.25 mg into the skin at bedtime. , Disp: , Rfl:    PRAVASTATIN SODIUM PO, Take 1 tablet by mouth daily.  (Patient not taking: Reported on 09/26/2022), Disp: , Rfl:    [place] injection, , Disp: , Rfl:    tiZANidine (ZANAFLEX) 4 MG tablet, TAKE 1 TABLET (4 MG TOTAL) BY MOUTH EVERY 8 (EIGHT) HOURS AS NEEDED FOR MUSCLE SPASMS, Disp: 30 tablet, Rfl: 0   traMADol (ULTRAM) 50 MG tablet, Take 50 mg by mouth 2 (two) times daily., Disp: , Rfl:    zolpidem (AMBIEN) 5 MG tablet, TAKE 1 TABLET (5 MG TOTAL) BY MOUTH AT BEDTIME AS NEEDED FOR SLEEP., Disp: 30 tablet, Rfl: 0

## 2023-01-02 ENCOUNTER — Ambulatory Visit (HOSPITAL_COMMUNITY)
Admission: RE | Admit: 2023-01-02 | Discharge: 2023-01-02 | Disposition: A | Discharge status: Home / Self Care | Payer: Medicare Other | Attending: Gastroenterology | Admitting: Gastroenterology

## 2023-01-02 ENCOUNTER — Encounter (HOSPITAL_COMMUNITY)
Admission: RE | Disposition: A | Discharge status: Home / Self Care | Payer: Self-pay | Source: Home / Self Care | Attending: Gastroenterology

## 2023-01-02 DIAGNOSIS — R1013 Epigastric pain: Secondary | ICD-10-CM | POA: Insufficient documentation

## 2023-01-02 DIAGNOSIS — K219 Gastro-esophageal reflux disease without esophagitis: Secondary | ICD-10-CM | POA: Diagnosis not present

## 2023-01-02 DIAGNOSIS — R1310 Dysphagia, unspecified: Secondary | ICD-10-CM | POA: Insufficient documentation

## 2023-01-02 DIAGNOSIS — R1110 Vomiting, unspecified: Secondary | ICD-10-CM

## 2023-01-02 HISTORY — PX: PH IMPEDANCE STUDY: SHX5565

## 2023-01-02 HISTORY — PX: ESOPHAGEAL MANOMETRY: SHX5429

## 2023-01-02 SURGERY — MANOMETRY, ESOPHAGUS

## 2023-01-02 MED ORDER — LIDOCAINE VISCOUS HCL 2 % MT SOLN
OROMUCOSAL | Status: AC
  Filled 2023-01-02: qty 15

## 2023-01-02 SURGICAL SUPPLY — 2 items
FACESHIELD LNG OPTICON STERILE (SAFETY) IMPLANT
GLOVE BIO SURGEON STRL SZ8 (GLOVE) ×2 IMPLANT

## 2023-01-02 NOTE — Progress Notes (Signed)
Esophageal manometry performed per protocol without complications.  Patient tolerated well. pH probed placed at 36cm per protocol without complication.  Patient tolerated well. Patient educated on probe, monitor and diary.  Patient aware to return tomorrow to have probe removed.

## 2023-01-05 ENCOUNTER — Encounter (HOSPITAL_COMMUNITY): Payer: Self-pay | Admitting: Gastroenterology

## 2023-01-07 ENCOUNTER — Ambulatory Visit: Payer: Self-pay

## 2023-01-07 NOTE — Patient Outreach (Signed)
  Care Coordination   01/07/2023 Name: [name] MRN: [id number] DOB: [birth year]   Care Coordination Outreach Attempts:  An unsuccessful telephone outreach was attempted today to offer the patient information about available care coordination services as a benefit of their health plan.   Follow Up Plan:  Additional outreach attempts will be made to offer the patient care coordination information and services.   Encounter Outcome:  No Answer   Care Coordination Interventions:  No, not indicated    [name], RN, MSN [facility] Care Management Coordinator Direct Line [phone]

## 2023-01-14 NOTE — Progress Notes (Deleted)
Office Visit Note  Patient: [name]             Date of Birth: [birth year]           MRN: [id number]             PCP: Donald Prose, MD Referring: Donald Prose, MD Visit Date: 01/24/2023 Occupation: '@GUAROCC'$ @  Subjective:  No chief complaint on file.   History of Present Illness: [name] is a 52 y.o. female ***     Activities of Daily Living:  Patient reports morning stiffness for *** {minute/hour:19697}.   Patient {ACTIONS;DENIES/REPORTS:21021675::"Denies"} nocturnal pain.  Difficulty dressing/grooming: {ACTIONS;DENIES/REPORTS:21021675::"Denies"} Difficulty climbing stairs: {ACTIONS;DENIES/REPORTS:21021675::"Denies"} Difficulty getting out of chair: {ACTIONS;DENIES/REPORTS:21021675::"Denies"} Difficulty using hands for taps, buttons, cutlery, and/or writing: {ACTIONS;DENIES/REPORTS:21021675::"Denies"}  No Rheumatology ROS completed.   PMFS History:  Patient Active Problem List   Diagnosis Date Noted   Fibromyalgia 12/25/2016   Other fatigue 12/25/2016   Primary insomnia 12/25/2016   Primary osteoarthritis of both knees 12/25/2016   ANA positive 12/25/2016   History of gastroesophageal reflux (GERD) 12/25/2016   History of anemia 12/25/2016   Trochanteric bursitis of both hips 12/25/2016   Anemia, iron deficiency 04/18/2015   Dysphagia, pharyngoesophageal phase 11/14/2011   Esophageal reflux 11/14/2011    Past Medical History:  Diagnosis Date   ADD (attention deficit disorder with hyperactivity)    Allergic rhinitis    Anxiety    Arthritis    Depression    Dysmenorrhea    Fibromyalgia    GERD (gastroesophageal reflux disease)    H. pylori infection    per patient    HLD (hyperlipidemia)    HTN (hypertension)    Insomnia    Irritable bowel syndrome    Restless leg syndrome    Sleep apnea     Family History  Problem Relation Age of Onset   Breast cancer Maternal Grandmother    Diabetes Sister    Irritable bowel syndrome  Sister    Heart disease Brother    Heart attack Brother    Stroke Mother    Heart disease Mother    Diabetes Sister    Irritable bowel syndrome Sister    Fibromyalgia Daughter    Colon cancer Neg Hx    Colon polyps Neg Hx    Esophageal cancer Neg Hx    Kidney disease Neg Hx    Gallbladder disease Neg Hx    Stomach cancer Neg Hx    Past Surgical History:  Procedure Laterality Date   BUNIONECTOMY Left 2008   DILATION AND CURETTAGE OF UTERUS  2004   EAR CYST EXCISION Left 1983   ESOPHAGEAL MANOMETRY N/A 01/02/2023   Procedure: ESOPHAGEAL MANOMETRY (EM);  Surgeon: Doran Stabler, MD;  Location: [facility];  Service: Gastroenterology;  Laterality: N/A;   KNEE ARTHROSCOPY Right    with knee cap adjustment   PH IMPEDANCE STUDY N/A 01/02/2023   Procedure: [place] IMPEDANCE STUDY;  Surgeon: Doran Stabler, MD;  Location: [facility];  Service: Gastroenterology;  Laterality: N/A;   UPPER GASTROINTESTINAL ENDOSCOPY  06/29/2020   Social History   Social History Narrative   Not on file   Immunization History  Administered Date(s) Administered   Influenza Split 09/01/2014, 09/26/2016, 10/03/2018   Influenza, Quadrivalent, Recombinant, Inj, Pf 09/21/2020, 09/12/2021   Influenza,inj,Quad PF,6+ Mos 10/03/2018   Influenza-Unspecified 09/12/2021   PFIZER(Purple Top)SARS-COV-2 Vaccination 03/11/2020, 04/01/2020, 12/08/2020   PNEUMOCOCCAL CONJUGATE-20 05/04/2022   Zoster, Live 05/04/2022     Objective:  Vital Signs: There were no vitals taken for this visit.   Physical Exam   Musculoskeletal Exam: ***  CDAI Exam: CDAI Score: -- Patient Global: --; Provider Global: -- Swollen: --; Tender: -- Joint Exam 01/24/2023   No joint exam has been documented for this visit   There is currently no information documented on the homunculus. Go to the Rheumatology activity and complete the homunculus joint exam.  Investigation: No additional findings.  Imaging: No results  found.  Recent Labs: Lab Results  Component Value Date   WBC 5.9 07/01/2022   HGB 12.0 07/01/2022   PLT 316 07/01/2022   NA 140 07/01/2022   K 3.3 (L) 07/01/2022   CL 107 07/01/2022   CO2 24 07/01/2022   GLUCOSE 101 (H) 07/01/2022   BUN 9 07/01/2022   CREATININE 0.74 07/01/2022   BILITOT 0.3 06/29/2020   ALKPHOS 114 06/29/2020   AST 12 06/29/2020   ALT 14 06/29/2020   PROT 7.9 07/19/2022   ALBUMIN 4.1 06/29/2020   CALCIUM 8.7 (L) 07/01/2022   GFRAA >60 12/28/2019    Speciality Comments: No specialty comments available.  Procedures:  No procedures performed Allergies: Lisinopril, Methylphenidate, and Prednisone   Assessment / Plan:     Visit Diagnoses: No diagnosis found.  Orders: No orders of the defined types were placed in this encounter.  No orders of the defined types were placed in this encounter.   Face-to-face time spent with patient was *** minutes. Greater than 50% of time was spent in counseling and coordination of care.  Follow-Up Instructions: No follow-ups on file.   Earnestine Mealing, CMA  Note - This record has been created using Editor, commissioning.  Chart creation errors have been sought, but may not always  have been located. Such creation errors do not reflect on  the standard of medical care.

## 2023-01-16 ENCOUNTER — Telehealth: Payer: Self-pay

## 2023-01-16 ENCOUNTER — Other Ambulatory Visit: Payer: Self-pay | Admitting: Gastroenterology

## 2023-01-16 NOTE — Telephone Encounter (Signed)
Received fax from patients pharmacy requesting rxrf on tizanidine. Patient not seen in a while at our office-this request was refused.

## 2023-01-17 ENCOUNTER — Telehealth: Payer: Self-pay

## 2023-01-17 DIAGNOSIS — I10 Essential (primary) hypertension: Secondary | ICD-10-CM | POA: Diagnosis not present

## 2023-01-17 DIAGNOSIS — F330 Major depressive disorder, recurrent, mild: Secondary | ICD-10-CM | POA: Diagnosis not present

## 2023-01-17 DIAGNOSIS — E1169 Type 2 diabetes mellitus with other specified complication: Secondary | ICD-10-CM | POA: Diagnosis not present

## 2023-01-17 DIAGNOSIS — K219 Gastro-esophageal reflux disease without esophagitis: Secondary | ICD-10-CM | POA: Diagnosis not present

## 2023-01-17 DIAGNOSIS — E7800 Pure hypercholesterolemia, unspecified: Secondary | ICD-10-CM | POA: Diagnosis not present

## 2023-01-17 NOTE — Telephone Encounter (Signed)
Received fax from [facility] requesting rxrf on tizanidine '4mg'$  on behalf of patient.

## 2023-01-17 NOTE — Telephone Encounter (Signed)
error 

## 2023-01-18 ENCOUNTER — Other Ambulatory Visit: Payer: Self-pay | Admitting: Surgical

## 2023-01-18 MED ORDER — TIZANIDINE HCL 4 MG PO TABS: 4.0000 mg | ORAL_TABLET | Freq: Three times a day (TID) | ORAL | 0 refills | Status: DC | PRN

## 2023-01-18 NOTE — Telephone Encounter (Signed)
Sent in refill

## 2023-01-18 NOTE — Telephone Encounter (Signed)
noted 

## 2023-01-22 NOTE — Progress Notes (Deleted)
Office Visit Note  Patient: [name]             Date of Birth: 30-Jan-1971           MRN: [id number]             PCP: Donald Prose, MD Referring: Donald Prose, MD Visit Date: 02/05/2023 Occupation: @GUAROCC$ @  Subjective:    History of Present Illness: [name] is a 52 y.o. female with history of positive ANA, fibromyalgia, and osteoarthritis.      Activities of Daily Living:  Patient reports morning stiffness for *** {minute/hour:19697}.   Patient {ACTIONS;DENIES/REPORTS:21021675::"Denies"} nocturnal pain.  Difficulty dressing/grooming: {ACTIONS;DENIES/REPORTS:21021675::"Denies"} Difficulty climbing stairs: {ACTIONS;DENIES/REPORTS:21021675::"Denies"} Difficulty getting out of chair: {ACTIONS;DENIES/REPORTS:21021675::"Denies"} Difficulty using hands for taps, buttons, cutlery, and/or writing: {ACTIONS;DENIES/REPORTS:21021675::"Denies"}  No Rheumatology ROS completed.   PMFS History:  Patient Active Problem List   Diagnosis Date Noted   Fibromyalgia 12/25/2016   Other fatigue 12/25/2016   Primary insomnia 12/25/2016   Primary osteoarthritis of both knees 12/25/2016   ANA positive 12/25/2016   History of gastroesophageal reflux (GERD) 12/25/2016   History of anemia 12/25/2016   Trochanteric bursitis of both hips 12/25/2016   Anemia, iron deficiency 04/18/2015   Dysphagia, pharyngoesophageal phase 11/14/2011   Esophageal reflux 11/14/2011    Past Medical History:  Diagnosis Date   ADD (attention deficit disorder with hyperactivity)    Allergic rhinitis    Anxiety    Arthritis    Depression    Dysmenorrhea    Fibromyalgia    GERD (gastroesophageal reflux disease)    H. pylori infection    per patient    HLD (hyperlipidemia)    HTN (hypertension)    Insomnia    Irritable bowel syndrome    Restless leg syndrome    Sleep apnea     Family History  Problem Relation Age of Onset   Breast cancer Maternal Grandmother    Diabetes Sister     Irritable bowel syndrome Sister    Heart disease Brother    Heart attack Brother    Stroke Mother    Heart disease Mother    Diabetes Sister    Irritable bowel syndrome Sister    Fibromyalgia Daughter    Colon cancer Neg Hx    Colon polyps Neg Hx    Esophageal cancer Neg Hx    Kidney disease Neg Hx    Gallbladder disease Neg Hx    Stomach cancer Neg Hx    Past Surgical History:  Procedure Laterality Date   BUNIONECTOMY Left 2008   DILATION AND CURETTAGE OF UTERUS  2004   EAR CYST EXCISION Left 1983   ESOPHAGEAL MANOMETRY N/A 01/02/2023   Procedure: ESOPHAGEAL MANOMETRY (EM);  Surgeon: Doran Stabler, MD;  Location: [facility];  Service: Gastroenterology;  Laterality: N/A;   KNEE ARTHROSCOPY Right    with knee cap adjustment   PH IMPEDANCE STUDY N/A 01/02/2023   Procedure: [place] IMPEDANCE STUDY;  Surgeon: Doran Stabler, MD;  Location: [facility];  Service: Gastroenterology;  Laterality: N/A;   UPPER GASTROINTESTINAL ENDOSCOPY  06/29/2020   Social History   Social History Narrative   Not on file   Immunization History  Administered Date(s) Administered   Influenza Split 09/01/2014, 09/26/2016, 10/03/2018   Influenza, Quadrivalent, Recombinant, Inj, Pf 09/21/2020, 09/12/2021   Influenza,inj,Quad PF,6+ Mos 10/03/2018   Influenza-Unspecified 09/12/2021   PFIZER(Purple Top)SARS-COV-2 Vaccination 03/11/2020, 04/01/2020, 12/08/2020   PNEUMOCOCCAL CONJUGATE-20 05/04/2022   Zoster, Live 05/04/2022  Objective: Vital Signs: There were no vitals taken for this visit.   Physical Exam Vitals and nursing note reviewed.  Constitutional:      Appearance: She is well-developed.  HENT:     Head: Normocephalic and atraumatic.  Eyes:     Conjunctiva/sclera: Conjunctivae normal.  Cardiovascular:     Rate and Rhythm: Normal rate and regular rhythm.     Heart sounds: Normal heart sounds.  Pulmonary:     Effort: Pulmonary effort is normal.     Breath sounds: Normal  breath sounds.  Abdominal:     General: Bowel sounds are normal.     Palpations: Abdomen is soft.  Musculoskeletal:     Cervical back: Normal range of motion.  Skin:    General: Skin is warm and dry.     Capillary Refill: Capillary refill takes less than 2 seconds.  Neurological:     Mental Status: She is alert and oriented to person, place, and time.  Psychiatric:        Behavior: Behavior normal.      Musculoskeletal Exam: ***  CDAI Exam: CDAI Score: -- Patient Global: --; Provider Global: -- Swollen: --; Tender: -- Joint Exam 02/05/2023   No joint exam has been documented for this visit   There is currently no information documented on the homunculus. Go to the Rheumatology activity and complete the homunculus joint exam.  Investigation: No additional findings.  Imaging: No results found.  Recent Labs: Lab Results  Component Value Date   WBC 5.9 07/01/2022   HGB 12.0 07/01/2022   PLT 316 07/01/2022   NA 140 07/01/2022   K 3.3 (L) 07/01/2022   CL 107 07/01/2022   CO2 24 07/01/2022   GLUCOSE 101 (H) 07/01/2022   BUN 9 07/01/2022   CREATININE 0.74 07/01/2022   BILITOT 0.3 06/29/2020   ALKPHOS 114 06/29/2020   AST 12 06/29/2020   ALT 14 06/29/2020   PROT 7.9 07/19/2022   ALBUMIN 4.1 06/29/2020   CALCIUM 8.7 (L) 07/01/2022   GFRAA >60 12/28/2019    Speciality Comments: No specialty comments available.  Procedures:  No procedures performed Allergies: Lisinopril, Methylphenidate, and Prednisone   Assessment / Plan:     Visit Diagnoses: No diagnosis found.  Orders: No orders of the defined types were placed in this encounter.  No orders of the defined types were placed in this encounter.   Face-to-face time spent with patient was *** minutes. Greater than 50% of time was spent in counseling and coordination of care.  Follow-Up Instructions: No follow-ups on file.   Earnestine Mealing, CMA  Note - This record has been created using Radio producer.  Chart creation errors have been sought, but may not always  have been located. Such creation errors do not reflect on  the standard of medical care.

## 2023-01-23 DIAGNOSIS — R1110 Vomiting, unspecified: Secondary | ICD-10-CM

## 2023-01-24 ENCOUNTER — Ambulatory Visit: Payer: Medicare HMO | Admitting: Physician Assistant

## 2023-01-24 DIAGNOSIS — M170 Bilateral primary osteoarthritis of knee: Secondary | ICD-10-CM

## 2023-01-24 DIAGNOSIS — Z840 Family history of diseases of the skin and subcutaneous tissue: Secondary | ICD-10-CM

## 2023-01-24 DIAGNOSIS — Z87898 Personal history of other specified conditions: Secondary | ICD-10-CM

## 2023-01-24 DIAGNOSIS — Z8639 Personal history of other endocrine, nutritional and metabolic disease: Secondary | ICD-10-CM

## 2023-01-24 DIAGNOSIS — Z862 Personal history of diseases of the blood and blood-forming organs and certain disorders involving the immune mechanism: Secondary | ICD-10-CM

## 2023-01-24 DIAGNOSIS — F5101 Primary insomnia: Secondary | ICD-10-CM

## 2023-01-24 DIAGNOSIS — R768 Other specified abnormal immunological findings in serum: Secondary | ICD-10-CM

## 2023-01-24 DIAGNOSIS — Z8719 Personal history of other diseases of the digestive system: Secondary | ICD-10-CM

## 2023-01-24 DIAGNOSIS — R5383 Other fatigue: Secondary | ICD-10-CM

## 2023-01-24 DIAGNOSIS — M7061 Trochanteric bursitis, right hip: Secondary | ICD-10-CM

## 2023-01-24 DIAGNOSIS — M797 Fibromyalgia: Secondary | ICD-10-CM

## 2023-01-24 DIAGNOSIS — E559 Vitamin D deficiency, unspecified: Secondary | ICD-10-CM

## 2023-01-24 DIAGNOSIS — R700 Elevated erythrocyte sedimentation rate: Secondary | ICD-10-CM

## 2023-01-24 DIAGNOSIS — G8929 Other chronic pain: Secondary | ICD-10-CM

## 2023-01-29 ENCOUNTER — Ambulatory Visit: Payer: Medicare HMO | Admitting: Pulmonary Disease

## 2023-02-04 ENCOUNTER — Ambulatory Visit: Payer: Self-pay

## 2023-02-04 NOTE — Patient Outreach (Signed)
  Care Coordination   02/04/2023 Name: [name] MRN: [id number] DOB: 1971-06-27   Care Coordination Outreach Attempts:  A second unsuccessful outreach was attempted today to offer the patient with information about available care coordination services as a benefit of their health plan.     Follow Up Plan:  Additional outreach attempts will be made to offer the patient care coordination information and services.   Encounter Outcome:  No Answer   Care Coordination Interventions:  No, not indicated    [name], RN, MSN [facility] Care Management Coordinator Direct Line [phone]

## 2023-02-05 ENCOUNTER — Ambulatory Visit: Payer: Self-pay | Admitting: Physician Assistant

## 2023-02-05 DIAGNOSIS — Z8719 Personal history of other diseases of the digestive system: Secondary | ICD-10-CM

## 2023-02-05 DIAGNOSIS — R768 Other specified abnormal immunological findings in serum: Secondary | ICD-10-CM

## 2023-02-05 DIAGNOSIS — Z862 Personal history of diseases of the blood and blood-forming organs and certain disorders involving the immune mechanism: Secondary | ICD-10-CM

## 2023-02-05 DIAGNOSIS — G8929 Other chronic pain: Secondary | ICD-10-CM

## 2023-02-05 DIAGNOSIS — M7061 Trochanteric bursitis, right hip: Secondary | ICD-10-CM

## 2023-02-05 DIAGNOSIS — R5383 Other fatigue: Secondary | ICD-10-CM

## 2023-02-05 DIAGNOSIS — Z840 Family history of diseases of the skin and subcutaneous tissue: Secondary | ICD-10-CM

## 2023-02-05 DIAGNOSIS — Z87898 Personal history of other specified conditions: Secondary | ICD-10-CM

## 2023-02-05 DIAGNOSIS — Z8639 Personal history of other endocrine, nutritional and metabolic disease: Secondary | ICD-10-CM

## 2023-02-05 DIAGNOSIS — M797 Fibromyalgia: Secondary | ICD-10-CM

## 2023-02-05 DIAGNOSIS — E559 Vitamin D deficiency, unspecified: Secondary | ICD-10-CM

## 2023-02-05 DIAGNOSIS — F5101 Primary insomnia: Secondary | ICD-10-CM

## 2023-02-05 DIAGNOSIS — R700 Elevated erythrocyte sedimentation rate: Secondary | ICD-10-CM

## 2023-02-05 DIAGNOSIS — M170 Bilateral primary osteoarthritis of knee: Secondary | ICD-10-CM

## 2023-02-23 ENCOUNTER — Other Ambulatory Visit: Payer: Self-pay | Admitting: Rheumatology

## 2023-02-25 NOTE — Telephone Encounter (Signed)
Next Visit: Due 11/2022. Message sent to the front to schedule.   Last Visit: 09/26/2022  Last Fill: 11/02/2022  Dx: Primary osteoarthritis of both knees   Current Dose per office note on 09/26/2022: not discussed  Okay to refill Voltaren Gel?

## 2023-02-25 NOTE — Telephone Encounter (Signed)
Please schedule patient a follow up visit. Patient was due 11/2022. Thanks!

## 2023-02-28 ENCOUNTER — Other Ambulatory Visit: Payer: Self-pay | Admitting: *Deleted

## 2023-02-28 DIAGNOSIS — K219 Gastro-esophageal reflux disease without esophagitis: Secondary | ICD-10-CM | POA: Diagnosis not present

## 2023-02-28 DIAGNOSIS — I10 Essential (primary) hypertension: Secondary | ICD-10-CM | POA: Diagnosis not present

## 2023-02-28 DIAGNOSIS — F330 Major depressive disorder, recurrent, mild: Secondary | ICD-10-CM | POA: Diagnosis not present

## 2023-02-28 DIAGNOSIS — E7800 Pure hypercholesterolemia, unspecified: Secondary | ICD-10-CM | POA: Diagnosis not present

## 2023-02-28 DIAGNOSIS — R0982 Postnasal drip: Secondary | ICD-10-CM

## 2023-02-28 DIAGNOSIS — E1169 Type 2 diabetes mellitus with other specified complication: Secondary | ICD-10-CM | POA: Diagnosis not present

## 2023-02-28 MED ORDER — FLUTICASONE PROPIONATE 50 MCG/ACT NA SUSP: 1.0000 | Freq: Every day | NASAL | 2 refills | Status: AC

## 2023-03-05 ENCOUNTER — Ambulatory Visit: Payer: Self-pay

## 2023-03-05 NOTE — Patient Outreach (Signed)
  Care Coordination   03/05/2023 Name: [name] MRN: [id number] DOB: [birth year]   Care Coordination Outreach Attempts:  A third unsuccessful outreach was attempted today to offer the patient with information about available care coordination services as a benefit of their health plan.   Follow Up Plan:  No further outreach attempts will be made at this time. We have been unable to contact the patient to offer or enroll patient in care coordination services  Encounter Outcome:  No Answer   Care Coordination Interventions:  No, not indicated    [name], RN, MSN [place] [facility] Care Management Coordinator Direct Line [phone]

## 2023-03-06 ENCOUNTER — Encounter: Payer: Self-pay | Admitting: Gastroenterology

## 2023-03-06 ENCOUNTER — Ambulatory Visit: Payer: Medicare HMO | Admitting: Gastroenterology

## 2023-03-06 VITALS — BP 132/80 | HR 98 | Ht 66.0 in | Wt 215.0 lb

## 2023-03-06 DIAGNOSIS — G8929 Other chronic pain: Secondary | ICD-10-CM

## 2023-03-06 DIAGNOSIS — K5909 Other constipation: Secondary | ICD-10-CM | POA: Diagnosis not present

## 2023-03-06 DIAGNOSIS — R1319 Other dysphagia: Secondary | ICD-10-CM

## 2023-03-06 DIAGNOSIS — R1013 Epigastric pain: Secondary | ICD-10-CM

## 2023-03-06 DIAGNOSIS — K219 Gastro-esophageal reflux disease without esophagitis: Secondary | ICD-10-CM

## 2023-03-06 NOTE — Patient Instructions (Signed)
_______________________________________________________  If your blood pressure at your visit was 140/90 or greater, please contact your primary care physician to follow up on this.  _______________________________________________________  If you are age 54 or older, your body mass index should be between 23-30. Your Body mass index is 34.7 kg/m. If this is out of the aforementioned range listed, please consider follow up with your Primary Care Provider.  If you are age 70 or younger, your body mass index should be between 19-25. Your Body mass index is 34.7 kg/m. If this is out of the aformentioned range listed, please consider follow up with your Primary Care Provider.   ________________________________________________________  The [facility] providers would like to encourage you to use [facility] to communicate with providers for non-urgent requests or questions.  Due to long hold times on the telephone, sending your provider a message by [facility] may be a faster and more efficient way to get a response.  Please allow 48 business hours for a response.  Please remember that this is for non-urgent requests.  _______________________________________________________  Medication Samples have been provided to the patient.  Drug name: Linzess 170mcg/290mcg              Qty: 8 caps of each  LOT: [id number] / [id number]  Exp.Date: 06-2023 / 02-2024  Dosing instructions: one a day. Start with 147mcg and increase to 215mcg if needed.  The patient has been instructed regarding the correct time, dose, and frequency of taking this medication, including desired effects and most common side effects.   Linzess works best when taken once a day every day, on an empty stomach, at least 30 minutes before your first meal of the day.  When Linzess is taken daily as directed:  *Constipation relief is typically felt in about a week *IBS-C patients may begin to experience relief from belly pain and overall abdominal  symptoms (pain, discomfort, and bloating) in about 1 week,   with symptoms typically improving over 12 weeks.  Diarrhea may occur in the first 2 weeks -keep taking it.  The diarrhea should go away and you should start having normal, complete, full bowel movements. It may be helpful to start treatment when you can be near the comfort of your own bathroom, such as a weekend.    It was a pleasure to see you today!  Thank you for trusting me with your gastrointestinal care!

## 2023-03-06 NOTE — Progress Notes (Signed)
[facility] Progress Note  Chief Complaint: Dyspepsia dysphagia  Subjective  History: From my October 2023 office note: "[name] was last seen in the office March 2022 for longstanding nonulcer dyspepsia persisting despite treatment and clearance of H. pylori.  She had burning epigastric discomfort with nausea.  When I last saw her, she was troubled by nearly a year of chronic cough for which I referred her to pulmonary clinic.  She was also scheduled for an upper GI series to look for obvious reflux as well as a gastric emptying study.The UGIS demonstrated reflux and nonspecific esophageal dysmotility, though it was difficult to know how much that was cause or effect of her unrelenting chronic cough.  GES demonstrated mild delay after 1 hour which normalized for the remainder of the study (not consistent with gastroparesis). Because ondansetron was not working well to control nausea, she was given a trial of metoclopramide in May 2022.  She has continued to use this from time to time, perhaps once or twice a week. Last EGD July 2021.   Last pulmonary clinic office note from July 2023 by Dr. Erin Fulling indicates the chronic cough significantly improved after stopping ACE inhibitor but was persistent and thought to be related to reactive airway and reflux.  She also had some autoantibodies and nodular changes on CT chest and was referred to rheumatology.  Rheumatology clinic note from August was reviewed.  ESR was also elevated soon after treatment for pulmonary infection, then it normalized. ____________________     [name] tells me she has ongoing reflux symptoms with regurgitation and pyrosis as well as intermittent solid and liquid dysphagia despite adhering to an antireflux diet and lifestyle as well as taking acid suppression medicine. She has also had ongoing dyspeptic symptoms, but was glad to report that the cough almost completely resolved after stopping her ACE inhibitor."  Esophageal  pH/impedance and manometry studies were performed January 17. _________________________  [name] was here to discuss her multiple digestive symptoms.  She still has dysphagia to solids and liquids and intermittent heartburn episodes.  However, she has lately been most bothered by a frequent burning epigastric and right upper quadrant discomfort that is quite troublesome and may even keep her up at night.  She has battled constipation for many years, even as far back as seeing Dr. Deatra Ina for colonoscopies in 2016.  She continues to have difficulty moving her bowels, perhaps on average once a week.  She says this predates any of the medicine she is currently on including Ozempic. Denies rectal bleeding.  ROS: Cardiovascular:  no chest pain Respiratory: no dyspnea Mood stable.  Says her psychiatric meds are managed by primary care he feels comfortable with their ability to do that. Myalgias (fibromyalgia) Systems negative except as above The patient's Past Medical, Family and Social History were reviewed and are on file in the EMR. Past Medical History:  Diagnosis Date   ADD (attention deficit disorder with hyperactivity)    Allergic rhinitis    Anxiety    Arthritis    Depression    Dysmenorrhea    Fibromyalgia    GERD (gastroesophageal reflux disease)    H. pylori infection    per patient    HLD (hyperlipidemia)    HTN (hypertension)    Insomnia    Irritable bowel syndrome    Restless leg syndrome    Sleep apnea     Objective:  Med list reviewed  Current Outpatient Medications:    albuterol (VENTOLIN HFA)  108 (90 Base) MCG/ACT inhaler, Inhale 2 puffs into the lungs in the morning, at noon, in the evening, and at bedtime., Disp: , Rfl:    amLODipine (NORVASC) 10 MG tablet, Take 10 mg by mouth daily., Disp: , Rfl:    amphetamine-dextroamphetamine (ADDERALL) 10 MG tablet, Take 10 mg by mouth daily with breakfast., Disp: , Rfl:    Blood Glucose Monitoring Suppl (ACCU-CHEK GUIDE ME)  w/Device KIT, See admin instructions., Disp: , Rfl:    buPROPion (WELLBUTRIN XL) 150 MG 24 hr tablet, Take 150 mg by mouth every morning., Disp: , Rfl:    diclofenac Sodium (VOLTAREN) 1 % GEL, APPLY 2-4 GRAMS TO AFFECTED JOINT 4 TIMES DAILY AS NEEDED., Disp: 400 g, Rfl: 1   escitalopram (LEXAPRO) 20 MG tablet, Take 20 mg by mouth at bedtime. , Disp: , Rfl:    fluticasone (FLONASE) 50 MCG/ACT nasal spray, Place 1 spray into both nostrils daily., Disp: 16 mL, Rfl: 2   gabapentin (NEURONTIN) 300 MG capsule, Take 3 capsules by mouth at bedtime., Disp: , Rfl:    ipratropium (ATROVENT) 0.03 % nasal spray, Place 2 sprays into both nostrils every 12 (twelve) hours as needed for rhinitis., Disp: , Rfl:    metFORMIN (GLUCOPHAGE-XR) 500 MG 24 hr tablet, Take 2 tablets by mouth 2 (two) times daily., Disp: , Rfl:    metoCLOPramide (REGLAN) 5 MG tablet, Take 1 tablet (5 mg total) by mouth every 8 (eight) hours as needed for nausea., Disp: 60 tablet, Rfl: 2   naproxen (NAPROSYN) 500 MG tablet, Take 500 mg by mouth as needed., Disp: , Rfl:    omeprazole (PRILOSEC) 20 MG capsule, Take 2 capsules (40 mg total) by mouth daily., Disp: 180 capsule, Rfl: 1   ONETOUCH VERIO test strip, 1 each daily., Disp: , Rfl:    OZEMPIC, 0.25 OR 0.5 MG/DOSE, 2 MG/1.5ML SOPN, Inject 0.25 mg into the skin at bedtime. , Disp: , Rfl:    rosuvastatin (CRESTOR) 20 MG tablet, Take 20 mg by mouth daily., Disp: , Rfl:    SHINGRIX injection, , Disp: , Rfl:    tiZANidine (ZANAFLEX) 4 MG tablet, Take 1 tablet (4 mg total) by mouth every 8 (eight) hours as needed for muscle spasms., Disp: 30 tablet, Rfl: 0   traMADol (ULTRAM) 50 MG tablet, Take 50 mg by mouth 2 (two) times daily., Disp: , Rfl:    zolpidem (AMBIEN) 5 MG tablet, TAKE 1 TABLET (5 MG TOTAL) BY MOUTH AT BEDTIME AS NEEDED FOR SLEEP., Disp: 30 tablet, Rfl: 0   Vital signs in last 24 hrs: Vitals:   03/06/23 0824  BP: 132/80  Pulse: 98   Wt Readings from Last 3 Encounters:   03/06/23 215 lb (97.5 kg)  09/26/22 212 lb 6.4 oz (96.3 kg)  09/24/22 214 lb 2 oz (97.1 kg)    Physical Exam  Well-appearing, normal vocal quality HEENT: sclera anicteric, oral mucosa moist without lesions Neck: supple, no thyromegaly, JVD or lymphadenopathy Cardiac: Regular without appreciable murmur,  no peripheral edema Pulm: clear to auscultation bilaterally, normal RR and effort noted Abdomen: soft, mild epigastric to right upper quadrant tenderness, with active bowel sounds. No guarding or palpable hepatosplenomegaly. Skin; warm and dry, no jaundice or rash  Labs:   ___________________________________________ Radiologic studies:   ____________________________________________ Other:  Esophageal manometry showed normal resting pressure and relaxation of the EGJ.  Esophageal body contractions were 40% normal peristalsis and DCI.  60% weak or absent peristalsis.  pH/impedance showed DeMeester score 41,  reflux events supine and upright.  Reflux events primarily acidic or weakly acidic. However, poor association between reflux events and reported heartburn.  _____________________________________________ Assessment & Plan  Assessment: Encounter Diagnoses  Name Primary?   Abdominal pain, chronic, epigastric Yes   Gastroesophageal reflux disease without esophagitis    Esophageal dysphagia    Chronic constipation    While she definitely has GERD as demonstrated on the pH/impedance study, there is poor symptom correlation of reported heartburn, indicating an element of functional heartburn.  Her upper abdominal pain has occurred for years with an unrevealing upper endoscopy in 2021.  I think she also has a nonulcer dyspepsia which I have spent some time discussing with her.  This is all in the milieu of fibromyalgia and probable functional constipation. Difficult to know to what extent any of her medicines may be contributing to the constipation.  Will ask Dr. Bryan Lemma to  review her case regarding the reasoning utility to have, but I am personally reluctant to recommended.  I believe would most likely reduce her heartburn and the dysphagia that I think is a motility disturbance from reflux, but she might still have persistent functional heartburn and there is the possibility that it could worsen the dysphagia if that motility is not the result of reflux.  I would like to help if I can come and I think we should try some different medicines to get her bowels moving better.  This would be on a trial basis since she is understandably reluctant to be on more medicines with her current regimen.  I recommend a trial of Linzess, and we gave her samples for 145 mcg once daily, and samples to increase to 290 mcg daily if needed. Should that prove ineffective or limited by side effects, we could try Motegrity, but that is typically cost prohibitive and/or declined on Medicare patients.  I also recommended some particular essential oil capsules that are combination of peppermint and ginger can be purchased online.  These can sometimes be helpful for nonulcer dyspepsia.  She will contact us by phone or portal message in 2 to 3 weeks with an update on how the trial of Linzess works for her.  40 minutes were spent on this encounter (including chart review, history/exam, counseling/coordination of care, and documentation) > 50% of that time was spent on counseling and coordination of care.   Nelida Meuse III

## 2023-03-17 ENCOUNTER — Other Ambulatory Visit: Payer: Self-pay | Admitting: Gastroenterology

## 2023-03-29 DIAGNOSIS — M797 Fibromyalgia: Secondary | ICD-10-CM | POA: Diagnosis not present

## 2023-03-29 DIAGNOSIS — F902 Attention-deficit hyperactivity disorder, combined type: Secondary | ICD-10-CM | POA: Diagnosis not present

## 2023-03-29 DIAGNOSIS — G4700 Insomnia, unspecified: Secondary | ICD-10-CM | POA: Diagnosis not present

## 2023-03-29 DIAGNOSIS — E7800 Pure hypercholesterolemia, unspecified: Secondary | ICD-10-CM | POA: Diagnosis not present

## 2023-03-29 DIAGNOSIS — F330 Major depressive disorder, recurrent, mild: Secondary | ICD-10-CM | POA: Diagnosis not present

## 2023-03-29 DIAGNOSIS — N946 Dysmenorrhea, unspecified: Secondary | ICD-10-CM | POA: Diagnosis not present

## 2023-03-29 DIAGNOSIS — I10 Essential (primary) hypertension: Secondary | ICD-10-CM | POA: Diagnosis not present

## 2023-03-29 DIAGNOSIS — E1140 Type 2 diabetes mellitus with diabetic neuropathy, unspecified: Secondary | ICD-10-CM | POA: Diagnosis not present

## 2023-03-31 ENCOUNTER — Other Ambulatory Visit: Payer: Self-pay | Admitting: Surgical

## 2023-04-03 DIAGNOSIS — E7800 Pure hypercholesterolemia, unspecified: Secondary | ICD-10-CM | POA: Diagnosis not present

## 2023-04-03 DIAGNOSIS — F330 Major depressive disorder, recurrent, mild: Secondary | ICD-10-CM | POA: Diagnosis not present

## 2023-04-03 DIAGNOSIS — K219 Gastro-esophageal reflux disease without esophagitis: Secondary | ICD-10-CM | POA: Diagnosis not present

## 2023-04-03 DIAGNOSIS — I10 Essential (primary) hypertension: Secondary | ICD-10-CM | POA: Diagnosis not present

## 2023-04-03 DIAGNOSIS — E1169 Type 2 diabetes mellitus with other specified complication: Secondary | ICD-10-CM | POA: Diagnosis not present

## 2023-04-04 ENCOUNTER — Encounter: Payer: Self-pay | Admitting: Gastroenterology

## 2023-04-08 NOTE — Telephone Encounter (Signed)
Understood.  We gave her samples of the 145 mcg and the 290 mcg.  I assume she probably took the 145 mcg but did not increase to the higher dose if she developed diarrhea.  If we have samples of the 72 mcg dose, she could try that, and start it by taking it just every other day.  - HD

## 2023-04-10 ENCOUNTER — Ambulatory Visit (INDEPENDENT_AMBULATORY_CARE_PROVIDER_SITE_OTHER): Payer: Medicare HMO | Admitting: Pulmonary Disease

## 2023-04-10 DIAGNOSIS — J4530 Mild persistent asthma, uncomplicated: Secondary | ICD-10-CM | POA: Diagnosis not present

## 2023-04-10 LAB — PULMONARY FUNCTION TEST
DL/VA % pred: 103 %
DL/VA: 4.34 ml/min/mmHg/L
DLCO cor % pred: 81 %
DLCO cor: 18.06 ml/min/mmHg
DLCO unc % pred: 81 %
DLCO unc: 18.06 ml/min/mmHg
FEF 25-75 Post: 1.69 L/sec
FEF 25-75 Pre: 1.56 L/sec
FEF2575-%Change-Post: 8 %
FEF2575-%Pred-Post: 61 %
FEF2575-%Pred-Pre: 56 %
FEV1-%Change-Post: 13 %
FEV1-%Pred-Post: 79 %
FEV1-%Pred-Pre: 69 %
FEV1-Post: 2.32 L
FEV1-Pre: 2.04 L
FEV1FVC-%Change-Post: 9 %
FEV1FVC-%Pred-Pre: 80 %
FEV6-%Change-Post: 3 %
FEV6-%Pred-Post: 91 %
FEV6-%Pred-Pre: 87 %
FEV6-Post: 3.3 L
FEV6-Pre: 3.18 L
FEV6FVC-%Change-Post: 0 %
FEV6FVC-%Pred-Post: 102 %
FEV6FVC-%Pred-Pre: 102 %
FVC-%Change-Post: 3 %
FVC-%Pred-Post: 88 %
FVC-%Pred-Pre: 85 %
FVC-Post: 3.3 L
FVC-Pre: 3.18 L
Post FEV1/FVC ratio: 70 %
Post FEV6/FVC ratio: 100 %
Pre FEV1/FVC ratio: 64 %
Pre FEV6/FVC Ratio: 100 %
RV % pred: 80 %
RV: 1.56 L
TLC % pred: 90 %
TLC: 4.86 L

## 2023-04-10 MED ORDER — LINACLOTIDE 72 MCG PO CAPS: 72.0000 ug | ORAL_CAPSULE | ORAL | 0 refills | Status: DC

## 2023-04-10 NOTE — Telephone Encounter (Signed)
Linzess 72 mcg samples placed at 2nd floor receptionist desk for patient to pick up.

## 2023-04-10 NOTE — Patient Instructions (Signed)
Full PFT Performed Today  

## 2023-04-10 NOTE — Progress Notes (Signed)
Full PFT Performed Today  

## 2023-04-17 ENCOUNTER — Encounter: Payer: Self-pay | Admitting: Pulmonary Disease

## 2023-04-17 ENCOUNTER — Ambulatory Visit (INDEPENDENT_AMBULATORY_CARE_PROVIDER_SITE_OTHER): Payer: Medicare HMO | Admitting: Pulmonary Disease

## 2023-04-17 VITALS — BP 126/80 | HR 78 | Ht 66.0 in | Wt 215.8 lb

## 2023-04-17 DIAGNOSIS — J4520 Mild intermittent asthma, uncomplicated: Secondary | ICD-10-CM | POA: Diagnosis not present

## 2023-04-17 DIAGNOSIS — E66811 Obesity, class 1: Secondary | ICD-10-CM

## 2023-04-17 DIAGNOSIS — R0681 Apnea, not elsewhere classified: Secondary | ICD-10-CM | POA: Diagnosis not present

## 2023-04-17 DIAGNOSIS — R918 Other nonspecific abnormal finding of lung field: Secondary | ICD-10-CM

## 2023-04-17 DIAGNOSIS — R0982 Postnasal drip: Secondary | ICD-10-CM | POA: Diagnosis not present

## 2023-04-17 DIAGNOSIS — R7689 Other specified abnormal immunological findings in serum: Secondary | ICD-10-CM

## 2023-04-17 DIAGNOSIS — R0683 Snoring: Secondary | ICD-10-CM | POA: Diagnosis not present

## 2023-04-17 DIAGNOSIS — R768 Other specified abnormal immunological findings in serum: Secondary | ICD-10-CM

## 2023-04-17 DIAGNOSIS — E669 Obesity, unspecified: Secondary | ICD-10-CM | POA: Diagnosis not present

## 2023-04-17 MED ORDER — FLUTICASONE-SALMETEROL 115-21 MCG/ACT IN AERO: 2.0000 | INHALATION_SPRAY | Freq: Two times a day (BID) | RESPIRATORY_TRACT | 12 refills | Status: AC

## 2023-04-17 MED ORDER — ALBUTEROL SULFATE HFA 108 (90 BASE) MCG/ACT IN AERS: 2.0000 | INHALATION_SPRAY | Freq: Four times a day (QID) | RESPIRATORY_TRACT | 11 refills | Status: AC

## 2023-04-17 NOTE — Patient Instructions (Addendum)
Start advair HFA inhaler 2 puffs twice daily - rinse mouth out after each use  Use albuterol 1-2 puffs every 4-6 hours as needed  I will reach out to Dr. Myrtie Neither on whether an esophogram study is necessary  Please call the rheumatology office for follow up.  We will schedule you for CT Chest scan in July or August and follow up afterward  Follow up in 4 months

## 2023-04-17 NOTE — Progress Notes (Signed)
Synopsis: Referred in May 2022 for chronic cough by Hoy Finlay, MD  Subjective:   PATIENT ID: [name] GENDER: female DOB: [birth year], MRN: [id number]  HPI  Chief Complaint  Patient presents with   Follow-up    F/U after PFT. States she has been having a hard time with the pollen this year. Increased runny nose, congestion and SOB. Productive cough with clear phlegm.    [name] is a 52 year old woman, former smoker with history of GERD, dysphagia, and fibromyalgia returns to pulmonary clinic for chronic cough.  She had positive ANA 1:320 nuclear speckled pattern and 1:1,280 nuclear centromere pattern, centromere ab 7.4, positive ENA RNP 1.8, ESR 66. She was seen by Dr. Corliss Skains 07/19/22 with out convincing clinical findings for scleroderma involvement.   She is followed by Dr. Myrtie Neither of GI. Esophageal manometry and pH monitoring was inconclusive for dysmotility. She continues to feel like food gets stuck in her chest.  PFTs 04/10/23 show mild obstructive airways disease with significant bronchodilator response.   Her mouth is always dry.   OV 07/10/22 She has been doing well since last visit. She continues to have mild intermittent dry cough. She has exertional dyspnea walking around her home. Denies wheezing.   She continues to have diffuse joint pains. She reports her sister has an autoimmune condition that she is on medication and another sister who has fibromyalgia.   We reviewed her CT Chest scan where the subcentimeter pulmonary nodules are stable and a new LUL groundglass nodule measuring 5mm.   OV 09/12/21 She reports her cough is much improved after stopping lisinopril on 06/2021 after seeing Rubye Oaks, NP. She continues to have intermittent cough that is sporadic. She has no night time awakenings from the cough. She denies sputum production. She continues to have on-going reflux symptoms despite 40mg  prilosec daily and sleeping on a wedge  pillow. Her GERD symptoms are worse in the morning time. She also has intermittent sinus congestion. She is using flonase daily and ipratropium nasal spray as needed. She uses albuterol inhaler as needed for the cough which she reports was mainly from summer allergies.   OV 06/12/21 She reports she is still coughing up blood-tinged sputum.  She is also blowing her nose with bloody secretions.  She has noted that her sinuses have been draining a lot these past few weeks.  She continues to deny any shortness of breath or wheezing with the cough.  HRCT chest on 05/17/2021 shows unremarkable parenchyma and airways.  No nodules or effusions noted.  OV 05/09/21 She reports having cough since last March.  She has been coughing up clear mucus until recently she has noted blood-tinged sputum.  She describes coughing episodes with posttussive emesis.  She has tried using albuterol and cough syrup without relief of her cough.  She does report shortness of breath and chest tightness with the cough.  She denies wheezing.  She is followed by GI for GERD and ongoing symptoms of nausea and vomiting.  She does report history of dysphagia requiring esophageal dilation.  She had EGD last year with biopsies showing H. pylori positivity in which she has completed treatment.  She is currently taking omeprazole 20 mg twice daily.  She also sleeps with the head of her bed elevated at night.  She denies any epistaxis but she reports a sore in her left nasal passage that can lead to blood-tinged nasal secretions and irritation.   She quit smoking in 1995  and has about a 12 pack year smoking history.  She denies history of blood clots.  Past Medical History:  Diagnosis Date   ADD (attention deficit disorder with hyperactivity)    Allergic rhinitis    Anxiety    Arthritis    Depression    Dysmenorrhea    Fibromyalgia    GERD (gastroesophageal reflux disease)    H. pylori infection    per patient    HLD (hyperlipidemia)     HTN (hypertension)    Insomnia    Irritable bowel syndrome    Restless leg syndrome    Sleep apnea      Family History  Problem Relation Age of Onset   Breast cancer Maternal Grandmother    Diabetes Sister    Irritable bowel syndrome Sister    Heart disease Brother    Heart attack Brother    Stroke Mother    Heart disease Mother    Diabetes Sister    Irritable bowel syndrome Sister    Fibromyalgia Daughter    Colon cancer Neg Hx    Colon polyps Neg Hx    Esophageal cancer Neg Hx    Kidney disease Neg Hx    Gallbladder disease Neg Hx    Stomach cancer Neg Hx      Social History   Socioeconomic History   Marital status: Widowed    Spouse name: Not on file   Number of children: 1   Years of education: Not on file   Highest education level: Not on file  Occupational History   Occupation: Disabled  Tobacco Use   Smoking status: Former    Packs/day: 0.10    Years: 12.00    Additional pack years: 0.00    Total pack years: 1.20    Types: Cigarettes    Quit date: 12/27/1993    Years since quitting: 29.3    Passive exposure: Never   Smokeless tobacco: Never  Vaping Use   Vaping Use: Never used  Substance and Sexual Activity   Alcohol use: No   Drug use: No   Sexual activity: Yes  Other Topics Concern   Not on file  Social History Narrative   Not on file   Social Determinants of Health   Financial Resource Strain: Not on file  Food Insecurity: Not on file  Transportation Needs: No Transportation Needs (07/10/2022)   PRAPARE - Administrator, Civil Service (Medical): No    Lack of Transportation (Non-Medical): No  Physical Activity: Not on file  Stress: Not on file  Social Connections: Not on file  Intimate Partner Violence: Not on file     Allergies  Allergen Reactions   Lisinopril     Cough    Methylphenidate Other (See Comments)    Other reaction(s): palpitations   Prednisone     Other reaction(s): rapid heartbeat     Outpatient  Medications Prior to Visit  Medication Sig Dispense Refill   amLODipine (NORVASC) 10 MG tablet Take 10 mg by mouth daily.     amphetamine-dextroamphetamine (ADDERALL) 10 MG tablet Take 10 mg by mouth daily with breakfast.     Blood Glucose Monitoring Suppl (ACCU-CHEK GUIDE ME) w/Device KIT See admin instructions.     buPROPion (WELLBUTRIN XL) 150 MG 24 hr tablet Take 150 mg by mouth every morning.     diclofenac Sodium (VOLTAREN) 1 % GEL APPLY 2-4 GRAMS TO AFFECTED JOINT 4 TIMES DAILY AS NEEDED. 400 g 1   escitalopram (LEXAPRO)  20 MG tablet Take 20 mg by mouth at bedtime.      fluticasone (FLONASE) 50 MCG/ACT nasal spray Place 1 spray into both nostrils daily. 16 mL 2   gabapentin (NEURONTIN) 300 MG capsule Take 3 capsules by mouth at bedtime.     ipratropium (ATROVENT) 0.03 % nasal spray Place 2 sprays into both nostrils every 12 (twelve) hours as needed for rhinitis.     linaclotide (LINZESS) 72 MCG capsule Take 1 capsule (72 mcg total) by mouth every other day for 16 days. Lot #[id number]  EXP: 01/2025 8 capsule 0   metoCLOPramide (REGLAN) 5 MG tablet TAKE 1 TABLET (5 MG TOTAL) BY MOUTH EVERY 8 (EIGHT) HOURS AS NEEDED FOR NAUSEA. 60 tablet 2   naproxen (NAPROSYN) 500 MG tablet Take 500 mg by mouth as needed.     omeprazole (PRILOSEC) 20 MG capsule Take 2 capsules (40 mg total) by mouth daily. 180 capsule 1   OZEMPIC, 0.25 OR 0.5 MG/DOSE, 2 MG/1.5ML SOPN Inject 0.25 mg into the skin at bedtime.      SHINGRIX injection      tiZANidine (ZANAFLEX) 4 MG tablet TAKE 1 TABLET (4 MG TOTAL) BY MOUTH EVERY 8 (EIGHT) HOURS AS NEEDED FOR MUSCLE SPASMS 30 tablet 0   traMADol (ULTRAM) 50 MG tablet Take 50 mg by mouth 2 (two) times daily.     zolpidem (AMBIEN) 5 MG tablet TAKE 1 TABLET (5 MG TOTAL) BY MOUTH AT BEDTIME AS NEEDED FOR SLEEP. 30 tablet 0   albuterol (VENTOLIN HFA) 108 (90 Base) MCG/ACT inhaler Inhale 2 puffs into the lungs in the morning, at noon, in the evening, and at bedtime.     metFORMIN  (GLUCOPHAGE-XR) 500 MG 24 hr tablet Take 2 tablets by mouth 2 (two) times daily.     ONETOUCH VERIO test strip 1 each daily.     rosuvastatin (CRESTOR) 20 MG tablet Take 20 mg by mouth daily.     No facility-administered medications prior to visit.   Review of Systems  Constitutional:  Negative for chills, fever, malaise/fatigue and weight loss.  HENT:  Negative for congestion, nosebleeds, sinus pain and sore throat.   Eyes: Negative.   Respiratory:  Positive for cough. Negative for hemoptysis, sputum production, shortness of breath and wheezing.   Cardiovascular:  Negative for chest pain, palpitations, orthopnea, claudication and leg swelling.  Gastrointestinal:  Positive for heartburn. Negative for abdominal pain, nausea and vomiting.  Genitourinary: Negative.   Musculoskeletal:  Negative for joint pain and myalgias.  Skin:  Negative for rash.  Neurological:  Negative for weakness.  Endo/Heme/Allergies: Negative.   Psychiatric/Behavioral: Negative.     Objective:   Vitals:   04/17/23 1023  BP: 126/80  Pulse: 78  SpO2: 100%  Weight: 215 lb 12.8 oz (97.9 kg)  Height: 5\' 6"  (1.676 m)    Physical Exam Constitutional:      General: She is not in acute distress.    Appearance: She is obese. She is not ill-appearing.  HENT:     Head: Normocephalic and atraumatic.  Eyes:     General: No scleral icterus. Cardiovascular:     Rate and Rhythm: Normal rate and regular rhythm.     Pulses: Normal pulses.     Heart sounds: Normal heart sounds. No murmur heard. Pulmonary:     Effort: Pulmonary effort is normal.     Breath sounds: Normal breath sounds. No wheezing, rhonchi or rales.  Musculoskeletal:     Right lower leg: No  edema.     Left lower leg: No edema.  Skin:    General: Skin is warm and dry.  Neurological:     General: No focal deficit present.     Mental Status: She is alert.     CBC    Component Value Date/Time   WBC 5.9 07/01/2022 1246   RBC 4.83 07/01/2022  1246   HGB 12.0 07/01/2022 1246   HCT 37.5 07/01/2022 1246   PLT 316 07/01/2022 1246   MCV 77.6 (L) 07/01/2022 1246   MCH 24.8 (L) 07/01/2022 1246   MCHC 32.0 07/01/2022 1246   RDW 16.5 (H) 07/01/2022 1246   LYMPHSABS 4.3 (H) 06/29/2020 1155   MONOABS 0.6 06/29/2020 1155   EOSABS 0.2 06/29/2020 1155   BASOSABS 0.1 06/29/2020 1155      Latest Ref Rng & Units 07/01/2022   12:46 PM 06/29/2020   11:55 AM 12/28/2019    4:33 PM  BMP  Glucose 70 - 99 mg/dL 295  621  308   BUN 6 - 20 mg/dL 9  8  9    Creatinine 0.44 - 1.00 mg/dL 6.57  8.46  9.62   Sodium 135 - 145 mmol/L 140  137  137   Potassium 3.5 - 5.1 mmol/L 3.3  3.3  3.9   Chloride 98 - 111 mmol/L 107  101  100   CO2 22 - 32 mmol/L 24  28    Calcium 8.9 - 10.3 mg/dL 8.7  9.2     Chest imaging: CT Chest 06/27/22 1. New 5 mm part-solid pulmonary nodule within the left upper lobe. Per Fleischner Society Guidelines, no routine follow-up imaging is recommended. These guidelines do not apply to immunocompromised patients and patients with cancer. Follow up in patients with significant comorbidities as clinically warranted. For lung cancer screening, adhere to Lung-RADS guidelines. Reference: Radiology. 2017; 284(1):228-43. 2. Additional bilateral pulmonary nodules are stable measuring up to 5 mm. Consider additional follow-up in 12 months.  CT Chest 05/17/21 Mediastinum/Nodes: No enlarged mediastinal, hilar, or axillary lymph nodes. Thyroid gland, trachea, and esophagus demonstrate no significant findings.   Lungs/Pleura: 4 mm subpleural nodule of the anterior left upper lobe (series 4, image 87). No evidence of fibrotic interstitial lung disease. No significant air trapping on expiratory phase imaging. No pleural effusion or pneumothorax.  CXR 12/28/2019 The heart size and mediastinal contours are within normal limits. Both lungs are clear. Mild thoracic spondylosis. No blunting of the costophrenic angles.  PFT:    Latest  Ref Rng & Units 04/10/2023   11:26 AM  PFT Results  FVC-Pre L 3.18   FVC-Predicted Pre % 85   FVC-Post L 3.30   FVC-Predicted Post % 88   Pre FEV1/FVC % % 64   Post FEV1/FCV % % 70   FEV1-Pre L 2.04   FEV1-Predicted Pre % 69   FEV1-Post L 2.32   DLCO uncorrected ml/min/mmHg 18.06   DLCO UNC% % 81   DLCO corrected ml/min/mmHg 18.06   DLCO COR %Predicted % 81   DLVA Predicted % 103   TLC L 4.86   TLC % Predicted % 90   RV % Predicted % 80      Assessment & Plan:   Mild intermittent asthma without complication - Plan: fluticasone-salmeterol (ADVAIR HFA) 115-21 MCG/ACT inhaler, albuterol (VENTOLIN HFA) 108 (90 Base) MCG/ACT inhaler  Post-nasal drainage  Snoring - Plan: Home sleep test  Apnea - Plan: Home sleep test  Obesity (BMI 30.0-34.9) - Plan: Home sleep test  ANA positive - Plan: CT CHEST HIGH RESOLUTION  Lung nodules - Plan: CT CHEST HIGH RESOLUTION  Discussion: [name] is a 52 year old woman, former smoker with history of GERD, dysphagia, and fibromyalgia who returns to pulmonary clinic for cough and asthma.  PFTs  show mild obstructive airways disease with significant bronchodilator response. We will start her on advair HFA 115-61mcg 2 puffs twice daily and continue as needed albuterol. Will monitor for any further improvement in her cough symptoms.   We will schedule her for HRCT Chest in 2 months to monitor for possible ILD given her history of ANA and Centromere Ab positivity. She has been evaluated by rheumatology with no clear clinical evidence of scleroderma.  She is taking omeprazole 40mg  daily and sleeping elevated on wedge pillow. She continues to have sensation of food sticking in her chest. Will discuss with GI whether barium esophogram is worth while.   Follow up in 4 months.   Olivia Comas, MD [facility] Office: [phone]    Current Outpatient Medications:    amLODipine (NORVASC) 10 MG tablet, Take 10  mg by mouth daily., Disp: , Rfl:    amphetamine-dextroamphetamine (ADDERALL) 10 MG tablet, Take 10 mg by mouth daily with breakfast., Disp: , Rfl:    Blood Glucose Monitoring Suppl (ACCU-CHEK GUIDE ME) w/Device KIT, See admin instructions., Disp: , Rfl:    buPROPion (WELLBUTRIN XL) 150 MG 24 hr tablet, Take 150 mg by mouth every morning., Disp: , Rfl:    diclofenac Sodium (VOLTAREN) 1 % GEL, APPLY 2-4 GRAMS TO AFFECTED JOINT 4 TIMES DAILY AS NEEDED., Disp: 400 g, Rfl: 1   escitalopram (LEXAPRO) 20 MG tablet, Take 20 mg by mouth at bedtime. , Disp: , Rfl:    fluticasone (FLONASE) 50 MCG/ACT nasal spray, Place 1 spray into both nostrils daily., Disp: 16 mL, Rfl: 2   fluticasone-salmeterol (ADVAIR HFA) 115-21 MCG/ACT inhaler, Inhale 2 puffs into the lungs 2 (two) times daily., Disp: 1 each, Rfl: 12   gabapentin (NEURONTIN) 300 MG capsule, Take 3 capsules by mouth at bedtime., Disp: , Rfl:    ipratropium (ATROVENT) 0.03 % nasal spray, Place 2 sprays into both nostrils every 12 (twelve) hours as needed for rhinitis., Disp: , Rfl:    linaclotide (LINZESS) 72 MCG capsule, Take 1 capsule (72 mcg total) by mouth every other day for 16 days. Lot #[id number]  EXP: 01/2025, Disp: 8 capsule, Rfl: 0   metoCLOPramide (REGLAN) 5 MG tablet, TAKE 1 TABLET (5 MG TOTAL) BY MOUTH EVERY 8 (EIGHT) HOURS AS NEEDED FOR NAUSEA., Disp: 60 tablet, Rfl: 2   naproxen (NAPROSYN) 500 MG tablet, Take 500 mg by mouth as needed., Disp: , Rfl:    omeprazole (PRILOSEC) 20 MG capsule, Take 2 capsules (40 mg total) by mouth daily., Disp: 180 capsule, Rfl: 1   OZEMPIC, 0.25 OR 0.5 MG/DOSE, 2 MG/1.5ML SOPN, Inject 0.25 mg into the skin at bedtime. , Disp: , Rfl:    SHINGRIX injection, , Disp: , Rfl:    tiZANidine (ZANAFLEX) 4 MG tablet, TAKE 1 TABLET (4 MG TOTAL) BY MOUTH EVERY 8 (EIGHT) HOURS AS NEEDED FOR MUSCLE SPASMS, Disp: 30 tablet, Rfl: 0   traMADol (ULTRAM) 50 MG tablet, Take 50 mg by mouth 2 (two) times daily., Disp: , Rfl:     zolpidem (AMBIEN) 5 MG tablet, TAKE 1 TABLET (5 MG TOTAL) BY MOUTH AT BEDTIME AS NEEDED FOR SLEEP., Disp: 30 tablet, Rfl: 0   albuterol (VENTOLIN HFA)  108 (90 Base) MCG/ACT inhaler, Inhale 2 puffs into the lungs in the morning, at noon, in the evening, and at bedtime., Disp: 8 g, Rfl: 11

## 2023-04-22 NOTE — Progress Notes (Signed)
Office Visit Note  Patient: [name]             Date of Birth: 1971-04-09           MRN: [id number]             PCP: Deatra James, MD Referring: Deatra James, MD Visit Date: 04/24/2023 Occupation: @GUAROCC @  Subjective:  Pain in multiple joints   History of Present Illness: [name] is a 52 y.o. female with history of osteoarthritis, positive ANA, and fibromyalgia.  Patient presents today experiencing increased pain involving multiple joints.  She is having generalized myalgias and muscle tenderness secondary to fibromyalgia but has also been having increased joint pain.  She is currently having pain in both shoulders, both elbows, the right knee, and both ankle joints.  Patient states that her right knee joint pain started several minutes ago.  She is using a cane to assist with ambulation.  She is planning to follow-up with Dr. August Saucer for further evaluation.  She has had no relief with tizanidine, tramadol, or gabapentin.  She is having interrupted sleep at night due to nocturnal pain.  She does not find Ambien to be helpful at managing her insomnia.  Patient states that she will be scheduling a sleep study.  She states that she has noticed increased facial flushing as well as sores in her nose and ears intermittently.  She is also had intermittent symptoms of Raynaud's phenomenon in her fingertips and toes.  She has ongoing fatigue on a daily basis.     Activities of Daily Living:  Patient reports morning stiffness for 5-6 hours.   Patient Reports nocturnal pain.  Difficulty dressing/grooming: Denies Difficulty climbing stairs: Reports Difficulty getting out of chair: Reports Difficulty using hands for taps, buttons, cutlery, and/or writing: Reports  Review of Systems  Constitutional:  Positive for fatigue.  HENT:  Positive for mouth dryness. Negative for mouth sores.        Nose and ear sores  Eyes:  Negative for dryness.  Respiratory:  Positive for  cough and shortness of breath.   Cardiovascular:  Positive for palpitations.  Gastrointestinal:  Positive for constipation and diarrhea. Negative for blood in stool.  Endocrine: Positive for increased urination.  Genitourinary:  Positive for involuntary urination.  Musculoskeletal:  Positive for joint pain, gait problem, joint pain, joint swelling, myalgias, muscle weakness, morning stiffness, muscle tenderness and myalgias.  Skin:  Positive for hair loss and redness. Negative for color change, rash and sensitivity to sunlight.  Allergic/Immunologic: Negative for susceptible to infections.  Neurological:  Positive for dizziness, numbness, headaches and parasthesias.  Hematological:  Negative for swollen glands.  Psychiatric/Behavioral:  Positive for depressed mood and sleep disturbance. The patient is nervous/anxious.     PMFS History:  Patient Active Problem List   Diagnosis Date Noted   Regurgitation of food 01/23/2023   Fibromyalgia 12/25/2016   Other fatigue 12/25/2016   Primary insomnia 12/25/2016   Primary osteoarthritis of both knees 12/25/2016   ANA positive 12/25/2016   History of gastroesophageal reflux (GERD) 12/25/2016   History of anemia 12/25/2016   Trochanteric bursitis of both hips 12/25/2016   Anemia, iron deficiency 04/18/2015   Dysphagia, pharyngoesophageal phase 11/14/2011   Esophageal reflux 11/14/2011    Past Medical History:  Diagnosis Date   ADD (attention deficit disorder with hyperactivity)    Allergic rhinitis    Anxiety    Arthritis    Depression    Dysmenorrhea  Fibromyalgia    GERD (gastroesophageal reflux disease)    H. pylori infection    per patient    HLD (hyperlipidemia)    HTN (hypertension)    Insomnia    Irritable bowel syndrome    Restless leg syndrome    Sleep apnea     Family History  Problem Relation Age of Onset   Breast cancer Maternal Grandmother    Diabetes Sister    Irritable bowel syndrome Sister    Heart disease  Brother    Heart attack Brother    Stroke Mother    Heart disease Mother    Diabetes Sister    Irritable bowel syndrome Sister    Fibromyalgia Daughter    Colon cancer Neg Hx    Colon polyps Neg Hx    Esophageal cancer Neg Hx    Kidney disease Neg Hx    Gallbladder disease Neg Hx    Stomach cancer Neg Hx    Past Surgical History:  Procedure Laterality Date   BUNIONECTOMY Left 2008   DILATION AND CURETTAGE OF UTERUS  2004   EAR CYST EXCISION Left 1983   ESOPHAGEAL MANOMETRY N/A 01/02/2023   Procedure: ESOPHAGEAL MANOMETRY (EM);  Surgeon: Sherrilyn Rist, MD;  Location: [facility];  Service: Gastroenterology;  Laterality: N/A;   KNEE ARTHROSCOPY Right    with knee cap adjustment   PH IMPEDANCE STUDY N/A 01/02/2023   Procedure: PH IMPEDANCE STUDY;  Surgeon: Sherrilyn Rist, MD;  Location: [facility];  Service: Gastroenterology;  Laterality: N/A;   UPPER GASTROINTESTINAL ENDOSCOPY  06/29/2020   Social History   Social History Narrative   Not on file   Immunization History  Administered Date(s) Administered   Influenza Split 09/01/2014, 09/26/2016, 10/03/2018   Influenza, Quadrivalent, Recombinant, Inj, Pf 09/21/2020, 09/12/2021   Influenza,inj,Quad PF,6+ Mos 10/03/2018   Influenza-Unspecified 09/12/2021   PFIZER(Purple Top)SARS-COV-2 Vaccination 03/11/2020, 04/01/2020, 12/08/2020   PNEUMOCOCCAL CONJUGATE-20 05/04/2022   Zoster, Live 05/04/2022     Objective: Vital Signs: BP (!) 133/90 (BP Location: Left Arm, Patient Position: Sitting, Cuff Size: Normal)   Pulse 99   Resp 16   Ht 5\' 6"  (1.676 m)   Wt 213 lb 3.2 oz (96.7 kg)   BMI 34.41 kg/m    Physical Exam Vitals and nursing note reviewed.  Constitutional:      Appearance: She is well-developed.  HENT:     Head: Normocephalic and atraumatic.  Eyes:     Conjunctiva/sclera: Conjunctivae normal.  Cardiovascular:     Rate and Rhythm: Normal rate and regular rhythm.     Heart sounds: Normal heart sounds.   Pulmonary:     Effort: Pulmonary effort is normal.     Breath sounds: Normal breath sounds.  Abdominal:     General: Bowel sounds are normal.     Palpations: Abdomen is soft.  Musculoskeletal:     Cervical back: Normal range of motion.  Lymphadenopathy:     Cervical: No cervical adenopathy.  Skin:    General: Skin is warm and dry.     Capillary Refill: Capillary refill takes less than 2 seconds.     Comments: Hyperpigmentation noted on nasal bridge. No malar rash noted currently.  No signs of sclerodactyly.  No digital ulcerations.    Neurological:     Mental Status: She is alert and oriented to person, place, and time.  Psychiatric:        Behavior: Behavior normal.      Musculoskeletal Exam: Generalized  hyperalgesia and positive tender points on exam.  C-spine has limited range of motion with lateral rotation.  Trapezius muscle tension and tenderness bilaterally.  Painful range of motion of both shoulders with abduction to about 120 degrees.  Elbow joints have good range of motion with no elbow joint line tenderness.  Tenderness over the medial lateral condyle bilaterally.  Tenderness over both wrist joints but no synovitis noted.  Complete fist formation bilaterally.  No synovitis over MCP joints.  Hip joints have good range of motion.  Painful range of motion of the right knee with warmth but no effusion.  Ankle joints have good range of motion with some tenderness bilaterally.  CDAI Exam: CDAI Score: -- Patient Global: --; Provider Global: -- Swollen: --; Tender: -- Joint Exam 04/24/2023   No joint exam has been documented for this visit   There is currently no information documented on the homunculus. Go to the Rheumatology activity and complete the homunculus joint exam.  Investigation: No additional findings.  Imaging: No results found.  Recent Labs: Lab Results  Component Value Date   WBC 5.9 07/01/2022   HGB 12.0 07/01/2022   PLT 316 07/01/2022   NA 140  07/01/2022   K 3.3 (L) 07/01/2022   CL 107 07/01/2022   CO2 24 07/01/2022   GLUCOSE 101 (H) 07/01/2022   BUN 9 07/01/2022   CREATININE 0.74 07/01/2022   BILITOT 0.3 06/29/2020   ALKPHOS 114 06/29/2020   AST 12 06/29/2020   ALT 14 06/29/2020   PROT 7.9 07/19/2022   ALBUMIN 4.1 06/29/2020   CALCIUM 8.7 (L) 07/01/2022   GFRAA >60 12/28/2019    Speciality Comments: No specialty comments available.  Procedures:  No procedures performed Allergies: Lisinopril, Methylphenidate, and Prednisone     Assessment / Plan:     Visit Diagnoses: ANA positive - Patient has a known history of positive ANA:  Lab work from 07/10/2022 was reviewed today in the office: ANA 1: 1280 centromere, 1: 320 speckled, centromere antibody positive, RNP 1.8, ESR 66, CK and aldolase WNL, dsDNA-, Scl-70-, Ro-, La- ANCA-, myositis panel negative.  Lab work from 07/19/2022 was reviewed with the patient today in the office: SPEP did not reveal any abnormal protein bands, ESR 28, beta-2 glycoprotein antibodies negative, anticardiolipin antibodies negative, C3 208, C4 WNL.  According to the patient her sister has morphea and Sjogren's syndrome. Patient continues to experience generalized myalgias and arthralgias.  She has no synovitis on examination today.  She has noticed intermittent facial redness, sores in her nose and ears, increased fatigue, and intermittent symptoms of Raynaud's phenomenon.  On examination no Malar rash was noted.  No signs of sclerodactyly were noted.  No digital ulcerations were noted.No synovitis was noted on examination today. She remains under the care of Dr. Francine Graven and Dr. Myrtie Neither. Esophageal manometry and pH monitoring was inconclusive for dysmotility.   No history of ILD confirmed.   Patient will be scheduled for a high-resolution chest CT to assess for ILD given history of positive ANA and positive anticentromere antibody.  The following lab work will also be updated today. Plan to review lab work  and the patients symptoms with Dr. Corliss Skains to determine the next steps.  As of now she does not currently require immunosuppressive therapy.   - Plan: Protein / creatinine ratio, urine, CBC with Differential/Platelet, COMPLETE METABOLIC PANEL WITH GFR, ANA, Anti-DNA antibody, double-stranded, C3 and C4, Sedimentation rate, RNP Antibody, Anti-scleroderma antibody, Centromere Antibodies, Rheumatoid factor, Cyclic citrul peptide antibody,  IgG, Sjogrens syndrome-A extractable nuclear antibody, Sjogrens syndrome-B extractable nuclear antibody, Anti-Smith antibody  H/O multiple pulmonary nodules - high-resolution CT on 05/17/2021 which showed subpleural nodule of the anterior left upper lobe. Chest CT updated 06/27/2022:New 5 mm part-solid pulmonary nodule within the left upper lobe.  Reviewed office visit from Dr. Francine Graven 04/17/23. PFTs 04/10/23 show mild obstructive airways disease with significant bronchodilator response--starting Advair.  Planning to update high resolution chest CT to assess for ILD given history of positive ANA and positive centromere.   Elevated sed rate - Sed rate was 66 on 07/10/2022.  ESR was rechecked on 07/19/2022 and was 28-WNL.ESR rechecked today.  - Plan: Sedimentation rate, C-reactive protein  Chronic right shoulder pain - Shoulder joint x-ray on June 04, 2022 which showed mild degenerative changes.  She had a right AC joint injection on 05/04/2022.  Limited abduction to about 120 degrees.   Fibromyalgia - She has generalized hyperalgesia and positive tender points on examination.  Patient is currently having a fibromyalgia flare.  She is having generalized myofascial pain.  She has tenderness over bilateral trapezius muscles and is also having tenderness over bilateral trochanteric bursa.  She is been having interrupted sleep at night due to nocturnal pain.  She has not found gabapentin, tramadol, tizanidine, or Ambien to be effective at managing her symptoms.  A referral to pain  management was placed today.  Trochanteric bursitis of both hips: She has tenderness over bilateral trochanteric bursa.  She has difficulty lying on her sides at night due to nocturnal pain.  Encouraged patient to perform stretching exercises daily.  Primary osteoarthritis of both knees - Right knee joint arthroscopic surgery by Dr. Cleophas Dunker.  Patient presents today with chronic pain in both knee joints.  According to the patient the pain in her right knee started several minutes ago while in the waiting room.  Patient plans on following up with Dr. August Saucer for further evaluation and management.  A referral to pain management will also be placed today.  Other fatigue: Chronic and secondary to insomnia.  Discussed the importance of regular exercise and good sleep hygiene.  Primary insomnia - She is taking Ambien 5 mg by mouth nightly as needed for insomnia.  She has not found Ambien to be effective at managing her insomnia.  Discouraged use of Ambien if she has not noticed any clinical benefit.  Other medical conditions are listed as follows:  History of diabetes mellitus  History of gastroesophageal reflux (GERD) - She is followed by Dr. Myrtie Neither of GI. Esophageal manometry and pH monitoring was inconclusive for dysmotility.taking omeprazole 40mg  daily, sleeping w/ wedge  Vitamin D deficiency  History of anemia  Family history of morphea-Sister  Orders: Orders Placed This Encounter  Procedures   Protein / creatinine ratio, urine   CBC with Differential/Platelet   COMPLETE METABOLIC PANEL WITH GFR   ANA   Anti-DNA antibody, double-stranded   C3 and C4   Sedimentation rate   RNP Antibody   Anti-scleroderma antibody   Centromere Antibodies   Rheumatoid factor   Cyclic citrul peptide antibody, IgG   Sjogrens syndrome-A extractable nuclear antibody   Sjogrens syndrome-B extractable nuclear antibody   Anti-Smith antibody   C-reactive protein   Ambulatory referral to Physical Medicine  Rehab   No orders of the defined types were placed in this encounter.    Follow-Up Instructions: Return in about 3 months (around 07/25/2023) for Osteoarthritis, +ANA, Fibromyalgia.   Gearldine Bienenstock, PA-C  Note - This record  has been created using [employer].  Chart creation errors have been sought, but may not always  have been located. Such creation errors do not reflect on  the standard of medical care.

## 2023-04-24 ENCOUNTER — Ambulatory Visit: Payer: Medicare HMO | Attending: Physician Assistant | Admitting: Physician Assistant

## 2023-04-24 ENCOUNTER — Encounter: Payer: Self-pay | Admitting: Physician Assistant

## 2023-04-24 ENCOUNTER — Ambulatory Visit: Payer: Medicare HMO | Admitting: Physician Assistant

## 2023-04-24 VITALS — BP 133/90 | HR 99 | Resp 16 | Ht 66.0 in | Wt 213.2 lb

## 2023-04-24 DIAGNOSIS — M170 Bilateral primary osteoarthritis of knee: Secondary | ICD-10-CM

## 2023-04-24 DIAGNOSIS — G8929 Other chronic pain: Secondary | ICD-10-CM

## 2023-04-24 DIAGNOSIS — M25511 Pain in right shoulder: Secondary | ICD-10-CM

## 2023-04-24 DIAGNOSIS — Z8719 Personal history of other diseases of the digestive system: Secondary | ICD-10-CM

## 2023-04-24 DIAGNOSIS — R768 Other specified abnormal immunological findings in serum: Secondary | ICD-10-CM | POA: Diagnosis not present

## 2023-04-24 DIAGNOSIS — Z840 Family history of diseases of the skin and subcutaneous tissue: Secondary | ICD-10-CM

## 2023-04-24 DIAGNOSIS — M797 Fibromyalgia: Secondary | ICD-10-CM

## 2023-04-24 DIAGNOSIS — M7061 Trochanteric bursitis, right hip: Secondary | ICD-10-CM

## 2023-04-24 DIAGNOSIS — R700 Elevated erythrocyte sedimentation rate: Secondary | ICD-10-CM

## 2023-04-24 DIAGNOSIS — Z87898 Personal history of other specified conditions: Secondary | ICD-10-CM

## 2023-04-24 DIAGNOSIS — R5383 Other fatigue: Secondary | ICD-10-CM

## 2023-04-24 DIAGNOSIS — F5101 Primary insomnia: Secondary | ICD-10-CM | POA: Diagnosis not present

## 2023-04-24 DIAGNOSIS — M7062 Trochanteric bursitis, left hip: Secondary | ICD-10-CM

## 2023-04-24 DIAGNOSIS — E559 Vitamin D deficiency, unspecified: Secondary | ICD-10-CM

## 2023-04-24 DIAGNOSIS — Z862 Personal history of diseases of the blood and blood-forming organs and certain disorders involving the immune mechanism: Secondary | ICD-10-CM

## 2023-04-24 DIAGNOSIS — Z8639 Personal history of other endocrine, nutritional and metabolic disease: Secondary | ICD-10-CM

## 2023-04-24 NOTE — Progress Notes (Signed)
Office Visit Note   Patient: [name]           Date of Birth: 1971/10/25           MRN: [id number] Visit Date: 04/24/2023              Requested by: Deatra James, MD [phone] [place],  [place] [address] PCP: Deatra James, MD  Chief Complaint  Patient presents with   Right Knee - Pain      HPI: Patient is a patient of Dr. Diamantina Providence and is also seen Dr. Delene Loll in the past.  Comes in today requesting a brace.  She was seen by rheumatology who referred her here to get a brace.  She does have a history of arthritis in her knee and past surgery.  I am unable to access the op report.  She is has no a new injury but says her knee just does not feel stable.  She has had a brace in the past which helps does not currently have 1  Assessment & Plan: Visit Diagnoses: Arthritis right knee  Plan: She does have quite a bit of quad atrophy I think she would do well doing physical therapy but she does request a brace today.  May follow-up with Franky Macho next week.  She understands that the brace can make her leg weaker.  Will provide her with a standard hinged brace  Follow-Up Instructions: No follow-ups on file.   Ortho Exam  Patient is alert, oriented, no adenopathy, well-dressed, normal affect, normal respiratory effort. Right knee she does have some hypertrophic though well-healed scar across the anterior of her knee.  No redness no effusion she does have crepitus with range of motion she has some quad atrophy but can sustain a straight leg raise no medial lateral joint line pain.  Compartments are soft and nontender she is neurovascular intact  Imaging: No results found. No images are attached to the encounter.  Labs: Lab Results  Component Value Date   ESRSEDRATE 28 07/19/2022   ESRSEDRATE 66 (H) 07/10/2022   CRP 1.2 07/10/2022   REPTSTATUS 05/25/2017 FINAL 05/20/2017   CULT  05/20/2017    NO GROWTH 5 DAYS Performed at [facility], [address], [place], [place] [address]      Lab Results  Component Value Date   ALBUMIN 4.1 06/29/2020   ALBUMIN 4.1 12/28/2019   ALBUMIN 3.9 11/15/2017    No results found for: "MG" Lab Results  Component Value Date   VD25OH 44 07/25/2020   VD25OH 16 (L) 01/26/2020   VD25OH 14 (L) 08/25/2018    No results found for: "PREALBUMIN"    Latest Ref Rng & Units 07/01/2022   12:46 PM 06/29/2020   11:55 AM 12/28/2019    4:33 PM  CBC EXTENDED  WBC 4.0 - 10.5 K/uL 5.9  10.1    RBC 3.87 - 5.11 MIL/uL 4.83  5.17    Hemoglobin 12.0 - 15.0 g/dL 27.2  53.6  64.4   HCT 36.0 - 46.0 % 37.5  39.4  39.0   Platelets 150 - 400 K/uL 316  409.0    NEUT# 1.4 - 7.7 K/uL  4.9    Lymph# 0.7 - 4.0 K/uL  4.3       There is no height or weight on file to calculate BMI.  Orders:  No orders of the defined types were placed in this encounter.  No orders of the defined types  were placed in this encounter.    Procedures: No procedures performed  Clinical Data: No additional findings.  ROS:  All other systems negative, except as noted in the HPI. Review of Systems  Objective: Vital Signs: There were no vitals taken for this visit.  Specialty Comments:  No specialty comments available.  PMFS History: Patient Active Problem List   Diagnosis Date Noted   Regurgitation of food 01/23/2023   Fibromyalgia 12/25/2016   Other fatigue 12/25/2016   Primary insomnia 12/25/2016   Primary osteoarthritis of both knees 12/25/2016   ANA positive 12/25/2016   History of gastroesophageal reflux (GERD) 12/25/2016   History of anemia 12/25/2016   Trochanteric bursitis of both hips 12/25/2016   Anemia, iron deficiency 04/18/2015   Dysphagia, pharyngoesophageal phase 11/14/2011   Esophageal reflux 11/14/2011   Past Medical History:  Diagnosis Date   ADD (attention deficit disorder with hyperactivity)    Allergic rhinitis    Anxiety    Arthritis    Depression    Dysmenorrhea    Fibromyalgia    GERD  (gastroesophageal reflux disease)    H. pylori infection    per patient    HLD (hyperlipidemia)    HTN (hypertension)    Insomnia    Irritable bowel syndrome    Restless leg syndrome    Sleep apnea     Family History  Problem Relation Age of Onset   Breast cancer Maternal Grandmother    Diabetes Sister    Irritable bowel syndrome Sister    Heart disease Brother    Heart attack Brother    Stroke Mother    Heart disease Mother    Diabetes Sister    Irritable bowel syndrome Sister    Fibromyalgia Daughter    Colon cancer Neg Hx    Colon polyps Neg Hx    Esophageal cancer Neg Hx    Kidney disease Neg Hx    Gallbladder disease Neg Hx    Stomach cancer Neg Hx     Past Surgical History:  Procedure Laterality Date   BUNIONECTOMY Left 2008   DILATION AND CURETTAGE OF UTERUS  2004   EAR CYST EXCISION Left 1983   ESOPHAGEAL MANOMETRY N/A 01/02/2023   Procedure: ESOPHAGEAL MANOMETRY (EM);  Surgeon: Sherrilyn Rist, MD;  Location: [facility];  Service: Gastroenterology;  Laterality: N/A;   KNEE ARTHROSCOPY Right    with knee cap adjustment   PH IMPEDANCE STUDY N/A 01/02/2023   Procedure: PH IMPEDANCE STUDY;  Surgeon: Sherrilyn Rist, MD;  Location: [facility];  Service: Gastroenterology;  Laterality: N/A;   UPPER GASTROINTESTINAL ENDOSCOPY  06/29/2020   Social History   Occupational History   Occupation: Disabled  Tobacco Use   Smoking status: Former    Packs/day: 0.10    Years: 12.00    Additional pack years: 0.00    Total pack years: 1.20    Types: Cigarettes    Quit date: 12/27/1993    Years since quitting: 29.3    Passive exposure: Never   Smokeless tobacco: Never  Vaping Use   Vaping Use: Never used  Substance and Sexual Activity   Alcohol use: No   Drug use: No   Sexual activity: Yes

## 2023-04-26 LAB — ANTI-SCLERODERMA ANTIBODY: Scleroderma (Scl-70) (ENA) Antibody, IgG: <1 AI

## 2023-04-26 LAB — COMPLETE METABOLIC PANEL WITH GFR
AG Ratio: 1.2 (calc) (ref 1.0–2.5)
ALT: 14 U/L (ref 6–29)
AST: 12 U/L (ref 10–35)
Albumin: 4.5 g/dL (ref 3.6–5.1)
Alkaline phosphatase (APISO): 104 U/L (ref 37–153)
BUN: 9 mg/dL (ref 7–25)
CO2: 26 mmol/L (ref 20–32)
Calcium: 9.8 mg/dL (ref 8.6–10.4)
Chloride: 103 mmol/L (ref 98–110)
Creat: 0.94 mg/dL (ref 0.50–1.03)
Globulin: 3.7 g/dL (calc) (ref 1.9–3.7)
Glucose, Bld: 116 mg/dL — ABNORMAL HIGH (ref 65–99)
Potassium: 4.2 mmol/L (ref 3.5–5.3)
Sodium: 141 mmol/L (ref 135–146)
Total Bilirubin: 0.5 mg/dL (ref 0.2–1.2)
Total Protein: 8.2 g/dL — ABNORMAL HIGH (ref 6.1–8.1)
eGFR: 73 mL/min/{1.73_m2} (ref >=60)

## 2023-04-26 LAB — ANTI-SMITH ANTIBODY: ENA SM Ab Ser-aCnc: <1 AI

## 2023-04-26 LAB — CBC WITH DIFFERENTIAL/PLATELET
Absolute Monocytes: 774 cells/uL (ref 200–950)
Basophils Absolute: 60 cells/uL (ref 0–200)
Basophils Relative: 0.7 %
Eosinophils Absolute: 102 cells/uL (ref 15–500)
Eosinophils Relative: 1.2 %
HCT: 42.4 % (ref 35.0–45.0)
Hemoglobin: 13.6 g/dL (ref 11.7–15.5)
Lymphs Abs: 3086 cells/uL (ref 850–3900)
MCH: 25 pg — ABNORMAL LOW (ref 27.0–33.0)
MCHC: 32.1 g/dL (ref 32.0–36.0)
MCV: 78.1 fL — ABNORMAL LOW (ref 80.0–100.0)
MPV: 9 fL (ref 7.5–12.5)
Monocytes Relative: 9.1 %
Neutro Abs: 4480 cells/uL (ref 1500–7800)
Neutrophils Relative %: 52.7 %
Platelets: 400 10*3/uL (ref 140–400)
RBC: 5.43 10*6/uL — ABNORMAL HIGH (ref 3.80–5.10)
RDW: 15.9 % — ABNORMAL HIGH (ref 11.0–15.0)
Total Lymphocyte: 36.3 %
WBC: 8.5 10*3/uL (ref 3.8–10.8)

## 2023-04-26 LAB — SEDIMENTATION RATE: Sed Rate: 17 mm/h (ref 0–30)

## 2023-04-26 LAB — ANA: Anti Nuclear Antibody (ANA): POSITIVE — AB

## 2023-04-26 LAB — ANTI-NUCLEAR AB-TITER (ANA TITER)
ANA TITER: 1:1280 {titer} — ABNORMAL HIGH
ANA Titer 1: 1:80 {titer} — ABNORMAL HIGH

## 2023-04-26 LAB — RHEUMATOID FACTOR: Rheumatoid fact SerPl-aCnc: <10 IU/mL (ref <14)

## 2023-04-26 LAB — PROTEIN / CREATININE RATIO, URINE
Creatinine, Urine: 187 mg/dL (ref 20–275)
Protein/Creat Ratio: 118 mg/g creat (ref 24–184)
Protein/Creatinine Ratio: 0.118 mg/mg creat (ref 0.024–0.184)
Total Protein, Urine: 22 mg/dL (ref 5–24)

## 2023-04-26 LAB — ANTI-DNA ANTIBODY, DOUBLE-STRANDED: ds DNA Ab: <1 IU/mL

## 2023-04-26 LAB — C3 AND C4
C3 Complement: 217 mg/dL — ABNORMAL HIGH (ref 83–193)
C4 Complement: 36 mg/dL (ref 15–57)

## 2023-04-26 LAB — SJOGRENS SYNDROME-B EXTRACTABLE NUCLEAR ANTIBODY: SSB (La) (ENA) Antibody, IgG: <1 AI

## 2023-04-26 LAB — C-REACTIVE PROTEIN: CRP: 4.6 mg/L (ref <8.0)

## 2023-04-26 LAB — SJOGRENS SYNDROME-A EXTRACTABLE NUCLEAR ANTIBODY: SSA (Ro) (ENA) Antibody, IgG: <1 AI

## 2023-04-26 LAB — CYCLIC CITRUL PEPTIDE ANTIBODY, IGG: Cyclic Citrullin Peptide Ab: <16 UNITS

## 2023-04-26 LAB — CENTROMERE ANTIBODIES: Centromere Ab Screen: 5.4 AI — AB

## 2023-04-26 LAB — RNP ANTIBODY: Ribonucleic Protein(ENA) Antibody, IgG: 1.3 AI — AB

## 2023-04-29 ENCOUNTER — Ambulatory Visit: Payer: Medicare HMO | Attending: Physician Assistant | Admitting: Physician Assistant

## 2023-04-29 ENCOUNTER — Other Ambulatory Visit: Payer: Self-pay

## 2023-04-29 ENCOUNTER — Encounter: Payer: Self-pay | Admitting: Physician Assistant

## 2023-04-29 VITALS — BP 123/87 | HR 98 | Resp 15 | Ht 66.0 in | Wt 212.4 lb

## 2023-04-29 DIAGNOSIS — M359 Systemic involvement of connective tissue, unspecified: Secondary | ICD-10-CM | POA: Diagnosis not present

## 2023-04-29 DIAGNOSIS — M170 Bilateral primary osteoarthritis of knee: Secondary | ICD-10-CM

## 2023-04-29 DIAGNOSIS — M7061 Trochanteric bursitis, right hip: Secondary | ICD-10-CM

## 2023-04-29 DIAGNOSIS — E559 Vitamin D deficiency, unspecified: Secondary | ICD-10-CM

## 2023-04-29 DIAGNOSIS — F5101 Primary insomnia: Secondary | ICD-10-CM | POA: Diagnosis not present

## 2023-04-29 DIAGNOSIS — Z862 Personal history of diseases of the blood and blood-forming organs and certain disorders involving the immune mechanism: Secondary | ICD-10-CM

## 2023-04-29 DIAGNOSIS — M797 Fibromyalgia: Secondary | ICD-10-CM | POA: Diagnosis not present

## 2023-04-29 DIAGNOSIS — Z87898 Personal history of other specified conditions: Secondary | ICD-10-CM

## 2023-04-29 DIAGNOSIS — R700 Elevated erythrocyte sedimentation rate: Secondary | ICD-10-CM | POA: Diagnosis not present

## 2023-04-29 DIAGNOSIS — R5383 Other fatigue: Secondary | ICD-10-CM | POA: Diagnosis not present

## 2023-04-29 DIAGNOSIS — Z79899 Other long term (current) drug therapy: Secondary | ICD-10-CM

## 2023-04-29 DIAGNOSIS — M7062 Trochanteric bursitis, left hip: Secondary | ICD-10-CM

## 2023-04-29 DIAGNOSIS — Z8639 Personal history of other endocrine, nutritional and metabolic disease: Secondary | ICD-10-CM

## 2023-04-29 DIAGNOSIS — Z840 Family history of diseases of the skin and subcutaneous tissue: Secondary | ICD-10-CM

## 2023-04-29 DIAGNOSIS — Z8719 Personal history of other diseases of the digestive system: Secondary | ICD-10-CM

## 2023-04-29 MED ORDER — LINACLOTIDE 72 MCG PO CAPS: 72.0000 ug | ORAL_CAPSULE | ORAL | 3 refills | Status: DC

## 2023-04-29 MED ORDER — HYDROXYCHLOROQUINE SULFATE 200 MG PO TABS: ORAL_TABLET | ORAL | 0 refills | Status: DC

## 2023-04-29 NOTE — Progress Notes (Signed)
Reviewed results with Dr. Corliss Skains.   ANA, centromere antibody, and RNP remains positive.  CBC and CMP stable.  dsDNA negative. ESR WNL. Rest of antibodies negative.   Protein creatinine ratio WNL.    If she remains symptomatic--she can schedule a sooner follow up visit to discuss initiating a trial of plaquenil

## 2023-04-29 NOTE — Progress Notes (Unsigned)
Please review and sign pended PLQ rx. Thanks!

## 2023-04-29 NOTE — Patient Instructions (Signed)
Standing Labs We placed an order today for your standing lab work.   Please have your standing labs drawn in 1 month, 3 months, and then every 5 months   Please have your labs drawn 2 weeks prior to your appointment so that the provider can discuss your lab results at your appointment, if possible.  Please note that you may see your imaging and lab results in MyChart before we have reviewed them. We will contact you once all results are reviewed. Please allow our office up to 72 hours to thoroughly review all of the results before contacting the office for clarification of your results.  WALK-IN LAB HOURS  Monday through Thursday from 8:00 am -12:30 pm and 1:00 pm-5:00 pm and Friday from 8:00 am-12:00 pm.  Patients with office visits requiring labs will be seen before walk-in labs.  You may encounter longer than normal wait times. Please allow additional time. Wait times may be shorter on  Monday and Thursday afternoons.  We do not book appointments for walk-in labs. We appreciate your patience and understanding with our staff.   Labs are drawn by [facility]. Please bring your co-pay at the time of your lab draw.  You may receive a bill from [facility] for your lab work.  Please note if you are on Hydroxychloroquine and and an order has been placed for a Hydroxychloroquine level,  you will need to have it drawn 4 hours or more after your last dose.  If you wish to have your labs drawn at another location, please call the office 24 hours in advance so we can fax the orders.  The office is located at [address], [address], [place], [place] [address]   If you have any questions regarding directions or hours of operation,  please call [phone].   As a reminder, please drink plenty of water prior to coming for your lab work. Thanks!   Hydroxychloroquine Tablets What is this medication? HYDROXYCHLOROQUINE (hye drox ee KLOR oh kwin) treats autoimmune conditions, such as rheumatoid arthritis  and lupus. It works by slowing down an overactive immune system. It may also be used to prevent and treat malaria. It works by killing the parasite that causes malaria. It belongs to a group of medications called DMARDs. This medicine may be used for other purposes; ask your health care provider or pharmacist if you have questions. COMMON BRAND NAME(S): Plaquenil, Quineprox What should I tell my care team before I take this medication? They need to know if you have any of these conditions: Diabetes Eye disease, vision problems Frequently drink alcohol G6PD deficiency Heart disease Irregular heartbeat or rhythm Kidney disease Liver disease Porphyria Psoriasis An unusual or allergic reaction to hydroxychloroquine, other medications, foods, dyes, or preservatives Pregnant or trying to get pregnant Breastfeeding How should I use this medication? Take this medication by mouth with water. Take it as directed on the prescription label. Do not cut, crush, or chew this medication. Swallow the tablets whole. Take it with food. Do not take it more than directed. Take all of this medication unless your care team tells you to stop it early. Keep taking it even if you think you are better. Take products with antacids in them at a different time of day than this medication. Take this medication 4 hours before or 4 hours after antacids. Talk to your care team if you have questions. Talk to your care team about the use of this medication in children. While this medication may be  prescribed for selected conditions, precautions do apply. Overdosage: If you think you have taken too much of this medicine contact a poison control center or emergency room at once. NOTE: This medicine is only for you. Do not share this medicine with others. What if I miss a dose? If you miss a dose, take it as soon as you can. If it is almost time for your next dose, take only that dose. Do not take double or extra doses. What may  interact with this medication? Do not take this medication with any of the following: Cisapride Dronedarone Pimozide Thioridazine This medication may also interact with the following: Ampicillin Antacids Cimetidine Cyclosporine Digoxin Kaolin Medications for diabetes, such as insulin, glipizide, glyburide Medications for seizures, such as carbamazepine, phenobarbital, phenytoin Mefloquine Methotrexate Other medications that cause heart rhythm changes Praziquantel This list may not describe all possible interactions. Give your health care provider a list of all the medicines, herbs, non-prescription drugs, or dietary supplements you use. Also tell them if you smoke, drink alcohol, or use illegal drugs. Some items may interact with your medicine. What should I watch for while using this medication? Visit your care team for regular checks on your progress. Tell your care team if your symptoms do not start to get better or if they get worse. You may need blood work done while you are taking this medication. If you take other medications that can affect heart rhythm, you may need more testing. Talk to your care team if you have questions. Your vision may be tested before and during use of this medication. Tell your care team right away if you have any change in your eyesight. This medication may cause serious skin reactions. They can happen weeks to months after starting the medication. Contact your care team right away if you notice fevers or flu-like symptoms with a rash. The rash may be red or purple and then turn into blisters or peeling of the skin. Or, you might notice a red rash with swelling of the face, lips or lymph nodes in your neck or under your arms. If you or your family notice any changes in your behavior, such as new or worsening depression, thoughts of harming yourself, anxiety, or other unusual or disturbing thoughts, or memory loss, call your care team right away. What side  effects may I notice from receiving this medication? Side effects that you should report to your care team as soon as possible: Allergic reactions--skin rash, itching, hives, swelling of the face, lips, tongue, or throat Aplastic anemia--unusual weakness or fatigue, dizziness, headache, trouble breathing, increased bleeding or bruising Change in vision Heart rhythm changes--fast or irregular heartbeat, dizziness, feeling faint or lightheaded, chest pain, trouble breathing Infection--fever, chills, cough, or sore throat Low blood sugar (hypoglycemia)--tremors or shaking, anxiety, sweating, cold or clammy skin, confusion, dizziness, rapid heartbeat Muscle injury--unusual weakness or fatigue, muscle pain, dark yellow or brown urine, decrease in amount of urine Pain, tingling, or numbness in the hands or feet Rash, fever, and swollen lymph nodes Redness, blistering, peeling, or loosening of the skin, including inside the mouth Thoughts of suicide or self-harm, worsening mood, or feelings of depression Unusual bruising or bleeding Side effects that usually do not require medical attention (report to your care team if they continue or are bothersome): Diarrhea Headache Nausea Stomach pain Vomiting This list may not describe all possible side effects. Call your doctor for medical advice about side effects. You may report side effects to FDA at 1-800-FDA-1088.  Where should I keep my medication? Keep out of the reach of children and pets. Store at room temperature up to 30 degrees C (86 degrees F). Protect from light. Get rid of any unused medication after the expiration date. To get rid of medications that are no longer needed or have expired: Take the medication to a medication take-back program. Check with your pharmacy or law enforcement to find a location. If you cannot return the medication, check the label or package insert to see if the medication should be thrown out in the garbage or  flushed down the toilet. If you are not sure, ask your care team. If it is safe to put it in the trash, empty the medication out of the container. Mix the medication with cat litter, dirt, coffee grounds, or other unwanted substance. Seal the mixture in a bag or container. Put it in the trash. NOTE: This sheet is a summary. It may not cover all possible information. If you have questions about this medicine, talk to your doctor, pharmacist, or health care provider.  2023 Elsevier/Gold Standard (2008-01-24 00:00:00)

## 2023-04-29 NOTE — Progress Notes (Signed)
Office Visit Note  Patient: [name]             Date of Birth: 10/16/1971           MRN: [id number]             PCP: Deatra James, MD Referring: Deatra James, MD Visit Date: 04/29/2023 Occupation: @GUAROCC @  Subjective:  Discuss results and treatment options   History of Present Illness: [name] is a 52 y.o. female with a history of positive ANA and fibromyalgia.  Patient presents today to discuss recent lab results as well as treatment options.  Patient was last seen in the office on 04/24/2023 at which time she was experiencing increased arthralgias, myalgias, intermittent oral and nasal ulcerations, Raynaud's phenomenon, fatigue, and facial rashes.     Activities of Daily Living:  Patient reports morning stiffness for 5-6 hours.   Patient Reports nocturnal pain.  Difficulty dressing/grooming: Denies Difficulty climbing stairs: Reports Difficulty getting out of chair: Reports Difficulty using hands for taps, buttons, cutlery, and/or writing: Reports  Review of Systems  Constitutional:  Positive for fatigue.  HENT:  Positive for mouth dryness. Negative for mouth sores and nose dryness.   Eyes:  Negative for pain, visual disturbance and dryness.  Respiratory:  Negative for cough, hemoptysis and difficulty breathing.   Cardiovascular:  Positive for palpitations. Negative for hypertension and swelling in legs/feet.  Gastrointestinal:  Positive for constipation and diarrhea. Negative for blood in stool.  Endocrine: Positive for increased urination.  Genitourinary:  Positive for involuntary urination. Negative for painful urination.  Musculoskeletal:  Positive for joint pain, gait problem, joint pain, myalgias, muscle weakness, morning stiffness, muscle tenderness and myalgias. Negative for joint swelling.  Skin:  Positive for hair loss and sensitivity to sunlight. Negative for color change, pallor, rash, nodules/bumps, skin tightness and ulcers.   Allergic/Immunologic: Negative for susceptible to infections.  Neurological:  Positive for dizziness and headaches. Negative for numbness and weakness.  Hematological:  Negative for swollen glands.  Psychiatric/Behavioral:  Positive for depressed mood and sleep disturbance. The patient is nervous/anxious.     PMFS History:  Patient Active Problem List   Diagnosis Date Noted   Regurgitation of food 01/23/2023   Fibromyalgia 12/25/2016   Other fatigue 12/25/2016   Primary insomnia 12/25/2016   Primary osteoarthritis of both knees 12/25/2016   ANA positive 12/25/2016   History of gastroesophageal reflux (GERD) 12/25/2016   History of anemia 12/25/2016   Trochanteric bursitis of both hips 12/25/2016   Anemia, iron deficiency 04/18/2015   Dysphagia, pharyngoesophageal phase 11/14/2011   Esophageal reflux 11/14/2011    Past Medical History:  Diagnosis Date   ADD (attention deficit disorder with hyperactivity)    Allergic rhinitis    Anxiety    Arthritis    Depression    Dysmenorrhea    Fibromyalgia    GERD (gastroesophageal reflux disease)    H. pylori infection    per patient    HLD (hyperlipidemia)    HTN (hypertension)    Insomnia    Irritable bowel syndrome    Restless leg syndrome    Sleep apnea     Family History  Problem Relation Age of Onset   Breast cancer Maternal Grandmother    Diabetes Sister    Irritable bowel syndrome Sister    Heart disease Brother    Heart attack Brother    Stroke Mother    Heart disease Mother    Diabetes Sister    Irritable  bowel syndrome Sister    Fibromyalgia Daughter    Colon cancer Neg Hx    Colon polyps Neg Hx    Esophageal cancer Neg Hx    Kidney disease Neg Hx    Gallbladder disease Neg Hx    Stomach cancer Neg Hx    Past Surgical History:  Procedure Laterality Date   BUNIONECTOMY Left 2008   DILATION AND CURETTAGE OF UTERUS  2004   EAR CYST EXCISION Left 1983   ESOPHAGEAL MANOMETRY N/A 01/02/2023   Procedure:  ESOPHAGEAL MANOMETRY (EM);  Surgeon: Sherrilyn Rist, MD;  Location: [facility];  Service: Gastroenterology;  Laterality: N/A;   KNEE ARTHROSCOPY Right    with knee cap adjustment   PH IMPEDANCE STUDY N/A 01/02/2023   Procedure: PH IMPEDANCE STUDY;  Surgeon: Sherrilyn Rist, MD;  Location: [facility];  Service: Gastroenterology;  Laterality: N/A;   UPPER GASTROINTESTINAL ENDOSCOPY  06/29/2020   Social History   Social History Narrative   Not on file   Immunization History  Administered Date(s) Administered   Influenza Split 09/01/2014, 09/26/2016, 10/03/2018   Influenza, Quadrivalent, Recombinant, Inj, Pf 09/21/2020, 09/12/2021   Influenza,inj,Quad PF,6+ Mos 10/03/2018   Influenza-Unspecified 09/12/2021   PFIZER(Purple Top)SARS-COV-2 Vaccination 03/11/2020, 04/01/2020, 12/08/2020   PNEUMOCOCCAL CONJUGATE-20 05/04/2022   Zoster, Live 05/04/2022     Objective: Vital Signs: BP 123/87 (BP Location: Right Arm, Patient Position: Sitting, Cuff Size: Normal)   Pulse 98   Resp 15   Ht 5\' 6"  (1.676 m)   Wt 212 lb 6.4 oz (96.3 kg)   BMI 34.28 kg/m    Physical Exam Vitals and nursing note reviewed.  Constitutional:      Appearance: She is well-developed.  HENT:     Head: Normocephalic and atraumatic.  Eyes:     Conjunctiva/sclera: Conjunctivae normal.  Cardiovascular:     Rate and Rhythm: Normal rate and regular rhythm.     Heart sounds: Normal heart sounds.  Pulmonary:     Effort: Pulmonary effort is normal.     Breath sounds: Normal breath sounds.  Abdominal:     General: Bowel sounds are normal.     Palpations: Abdomen is soft.  Musculoskeletal:     Cervical back: Normal range of motion.  Lymphadenopathy:     Cervical: No cervical adenopathy.  Skin:    General: Skin is warm and dry.     Capillary Refill: Capillary refill takes less than 2 seconds.  Neurological:     Mental Status: She is alert and oriented to person, place, and time.  Psychiatric:         Behavior: Behavior normal.      Musculoskeletal Exam: Generalized hyperalgesia and positive tender points on exam.  C-spine has slightly limited range of motion with lateral rotation.  Trapezius muscle tension tenderness bilaterally.  Painful and limited range of motion above shoulder joints with abduction to about 120 degrees.  Elbow joints, wrist joints, MCPs, PIPs, DIPs have good range of motion with no synovitis.  Complete fist formation bilaterally.  Hip joints have good range of motion with no groin pain.  Painful range of motion of both knee joints especially the right knee.  Ankle joints have good range of motion with no tenderness or synovitis.  CDAI Exam: CDAI Score: -- Patient Global: --; Provider Global: -- Swollen: --; Tender: -- Joint Exam 04/29/2023   No joint exam has been documented for this visit   There is currently no information documented on the  homunculus. Go to the Rheumatology activity and complete the homunculus joint exam.  Investigation: No additional findings.  Imaging: No results found.  Recent Labs: Lab Results  Component Value Date   WBC 8.5 04/24/2023   HGB 13.6 04/24/2023   PLT 400 04/24/2023   NA 141 04/24/2023   K 4.2 04/24/2023   CL 103 04/24/2023   CO2 26 04/24/2023   GLUCOSE 116 (H) 04/24/2023   BUN 9 04/24/2023   CREATININE 0.94 04/24/2023   BILITOT 0.5 04/24/2023   ALKPHOS 114 06/29/2020   AST 12 04/24/2023   ALT 14 04/24/2023   PROT 8.2 (H) 04/24/2023   ALBUMIN 4.1 06/29/2020   CALCIUM 9.8 04/24/2023   GFRAA >60 12/28/2019    Speciality Comments: No specialty comments available.  Procedures:  No procedures performed Allergies: Lisinopril, Methylphenidate, and Prednisone      Assessment / Plan:     Visit Diagnoses: Undifferentiated connective tissue disease (HCC): Lab work from 04/24/2023 was reviewed today in the office: ANA 1: [address] [facility], 1: [address] [place], centromere antibody remains positive, RNP remains positive-1.3, CRP within  normal limits, Smith antibody negative, [place] antibodies negative, RF negative, anti-CCP negative, SCL 70 negative, ESR within normal limits, dsDNA negative.  Reviewed results today in detail and all questions were addressed.  Patient has family history of morphea--sister.   Under the care of Dr. Myrtie Neither and Dr. Francine Graven. Esophageal manometry and pH monitoring was inconclusive for dysmotility.   No history of ILD confirmed.    Patient has a long standing history of positive ANA, positive centromere antibody, and positive RNP.  She has become increasingly symptomatic over the last year.  She is having increased arthralgias, joint stiffness, myalgias, fatigue, intermittent symptoms of Raynaud's phenomenon, facial rashes, and sores in her mouth and ears.  Plan to initiate a trial of Plaquenil 200 mg 1 tablet by mouth twice daily Monday through Friday.  Indications, contraindications, and potential side effects of Plaquenil were discussed today in detail.  All questions were addressed.  She was advised to notify us if she develops any new or worsening symptoms.  She will follow-up in the office in 6 to 8 weeks to assess her response.  Patient was counseled on the purpose, proper use, and adverse effects of hydroxychloroquine including nausea/diarrhea, skin rash, headaches, and sun sensitivity.  Advised patient to wear sunscreen once starting hydroxychloroquine to reduce risk of rash associated with sun sensitivity.  Discussed importance of annual eye exams while on hydroxychloroquine to monitor to ocular toxicity and discussed importance of frequent laboratory monitoring.  Provided patient with eye exam form for baseline ophthalmologic exam.  Reviewed risk for QTC prolongation when used in combination with other QTc prolonging agents (including but not limited to antiarrhythmics, macrolide antibiotics, flouroquinolones, tricyclic antidepressants, citalopram, specific antipsychotics, ondansetron, migraine triptans,  and methadone). Provided patient with educational materials on hydroxychloroquine and answered all questions.  Patient consented to hydroxychloroquine. Will upload consent in the media tab.   Dose will be Plaquenil 200 mg twice daily Monday through Friday.  Prescription pending lab results.  High risk medication use - Plan to initiate a trial of plaquenil: 200 mg 1 tablet by mouth twice daily Monday through Friday.  She will notify us if she cannot tolerate taking Plaquenil. CBC and CMP were drawn on 04/24/2023.  She will require updated CBC and CMP in 1 month, 3 months, then every 5 months.  Standing orders for CBC and CMP were placed today. She will require a  baseline Plaquenil eye examination.  She was given an eye examination form to take with her to her appointment. - Plan: CBC with Differential/Platelet, COMPLETE METABOLIC PANEL WITH GFR  Elevated sed rate: ESR and CRP were within normal limits on 04/24/2023.  H/O multiple pulmonary nodules:  Under the care of Dr. Francine Graven. High-resolution CT on 05/17/2021 which showed subpleural nodule of the anterior left upper lobe. Chest CT updated 06/27/2022:New 5 mm part-solid pulmonary nodule within the left upper lobe.  PFTs 04/10/23 show mild obstructive airways disease with significant bronchodilator response--starting Advair.  Planning to update high resolution chest CT to assess for ILD given history of positive ANA and positive centromere.   Fibromyalgia: Generalized hyperalgesia and positive tender points on exam.  A referral to pain management was placed after her last office visit on 04/24/2023.  Trochanteric bursitis of both hips: Tenderness over bilateral trochanteric bursa.  Good hip ROM with no groin pain currently.   Primary osteoarthritis of both knees: Painful range of motion of both knee joints especially the right knee.  She was evaluated at [facility] on 04/24/2023.  She was provided a standard hinge brace.  Declined PT.   Other fatigue: Chronic    Primary insomnia: Interrupted sleep at night.   Other medical conditions are listed as follows:  History of diabetes mellitus  History of gastroesophageal reflux (GERD)  Vitamin D deficiency  History of anemia  Family history of morphea-Sister  Orders: Orders Placed This Encounter  Procedures   CBC with Differential/Platelet   COMPLETE METABOLIC PANEL WITH GFR   No orders of the defined types were placed in this encounter.   Follow-Up Instructions: Return in about 2 months (around 06/29/2023) for UCTD .   Gearldine Bienenstock, PA-C  Note - This record has been created using Dragon software.  Chart creation errors have been sought, but may not always  have been located. Such creation errors do not reflect on  the standard of medical care.

## 2023-05-01 ENCOUNTER — Other Ambulatory Visit: Payer: Self-pay | Admitting: Surgical

## 2023-05-01 ENCOUNTER — Encounter: Payer: Self-pay | Admitting: Physical Medicine & Rehabilitation

## 2023-05-06 ENCOUNTER — Other Ambulatory Visit: Payer: Self-pay | Admitting: Gastroenterology

## 2023-05-12 DIAGNOSIS — E66811 Obesity, class 1: Secondary | ICD-10-CM

## 2023-05-12 DIAGNOSIS — G4730 Sleep apnea, unspecified: Secondary | ICD-10-CM | POA: Diagnosis not present

## 2023-05-12 DIAGNOSIS — R0681 Apnea, not elsewhere classified: Secondary | ICD-10-CM

## 2023-05-12 DIAGNOSIS — R0683 Snoring: Secondary | ICD-10-CM

## 2023-05-16 ENCOUNTER — Other Ambulatory Visit: Payer: Self-pay | Admitting: Surgical

## 2023-05-17 ENCOUNTER — Encounter: Payer: Medicare HMO | Admitting: Physical Medicine & Rehabilitation

## 2023-05-21 IMAGING — MR MR CERVICAL SPINE W/O CM
4 of 5 series · 29 of 48 positions shown · non-contrast
Comparison: Cervical spine radiographs 04/26/2021

CLINICAL DATA: Neck pain with arm pain

EXAM:
MRI CERVICAL SPINE WITHOUT CONTRAST
TECHNIQUE: Multiplanar, multisequence MR imaging of the cervical spine was
performed. No intravenous contrast was administered.

[Series 5: T2 · sagittal · 3.0mm · 0.55mm/px · 7 of 18 slices shown (1 of 2)]
[im 1/18]
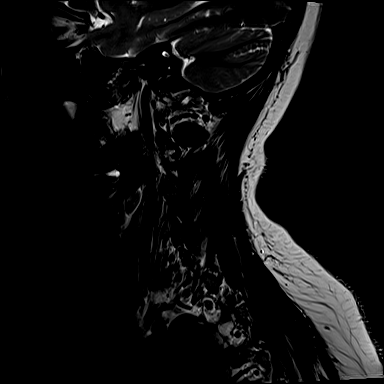
[im 3/18]
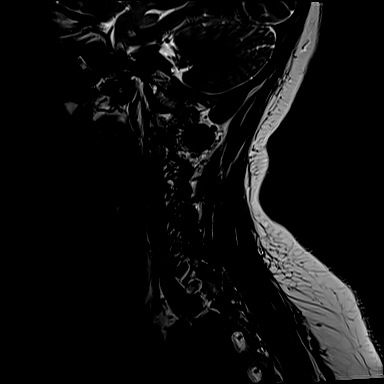
[im 6/18]
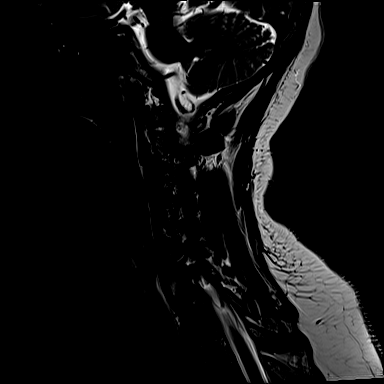
[im 9/18]
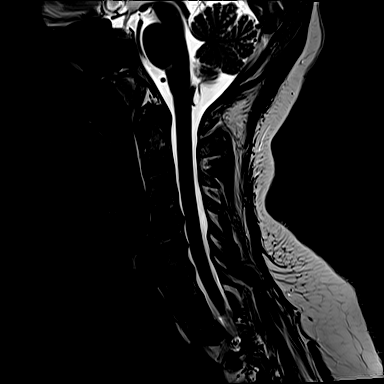
[im 12/18]
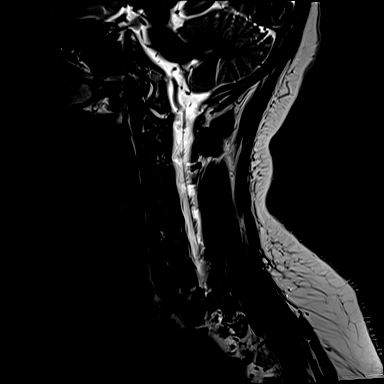
[im 15/18]
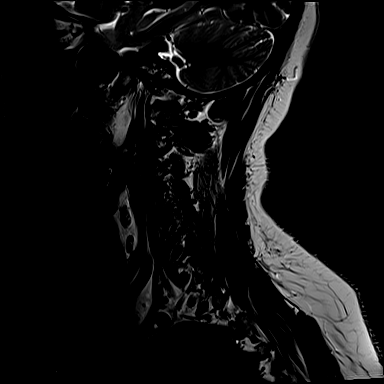
[im 18/18]
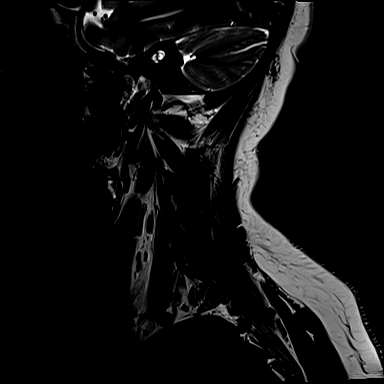

[Series 6: T1 · sagittal · 3.0mm · 0.66mm/px · 7 of 18 slices shown]
[im 1/18]
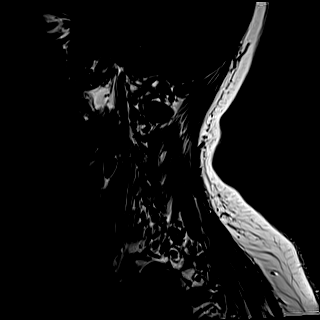
[im 3/18]
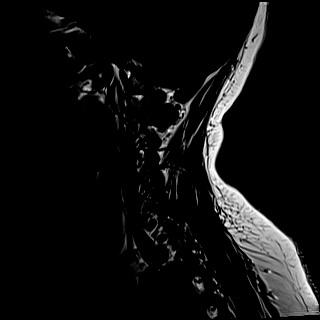
[im 6/18]
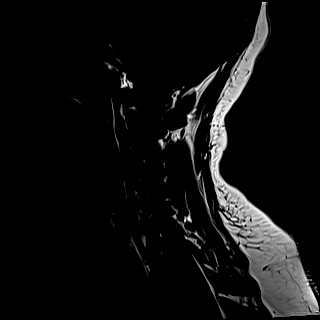
[im 9/18]
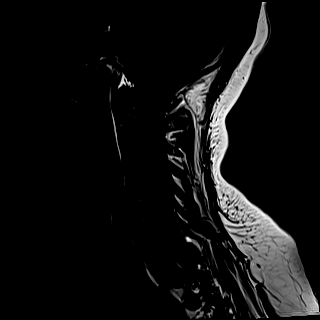
[im 12/18]
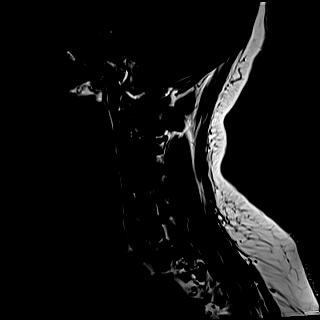
[im 15/18]
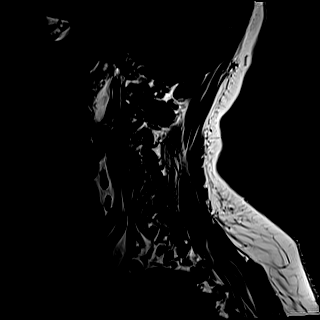
[im 18/18]
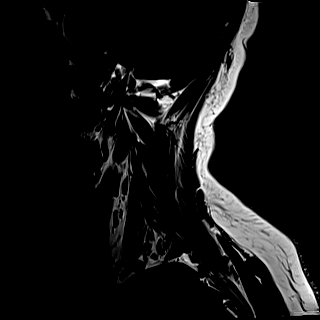

[Series 7: STIR · sagittal · 3.0mm · 0.33mm/px · 6 of 18 slices shown]
[im 1/18]
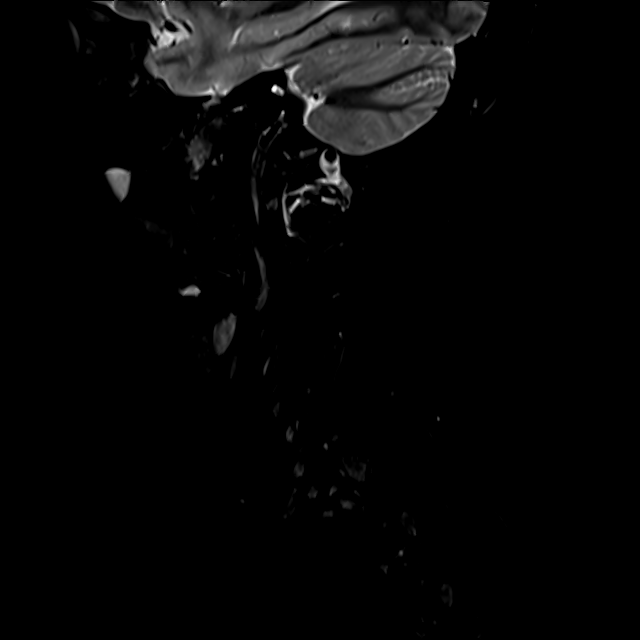
[im 3/18]
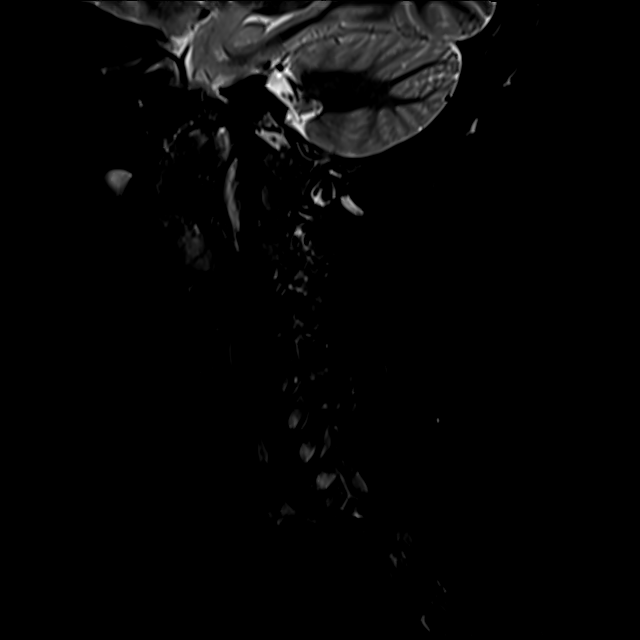
[im 5/18]
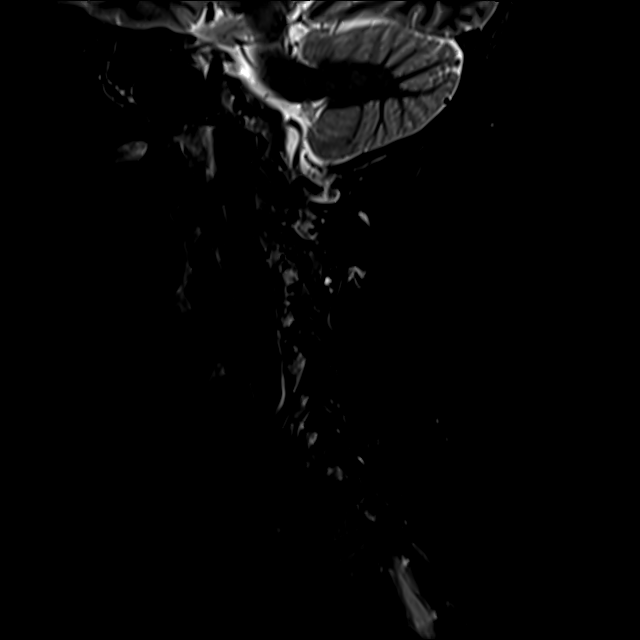
[im 8/18]
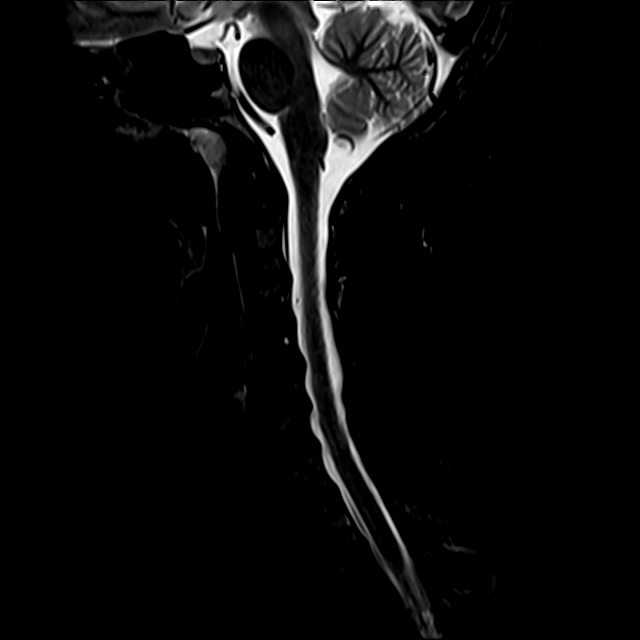
[im 10/18]
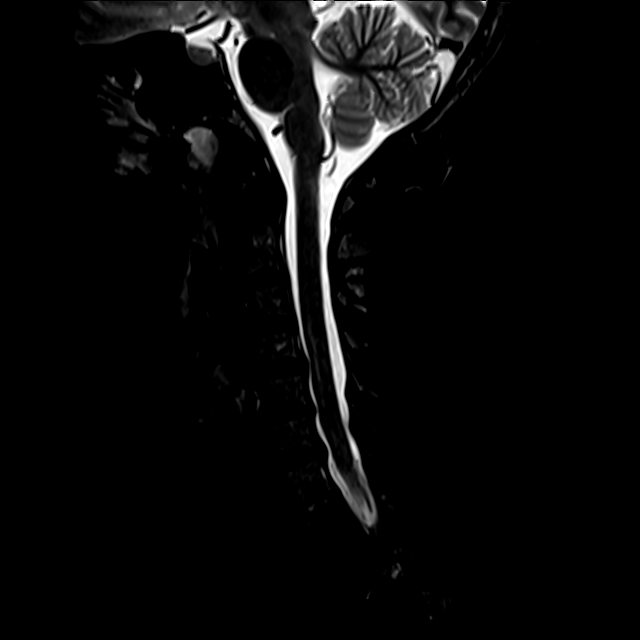
[im 15/18]
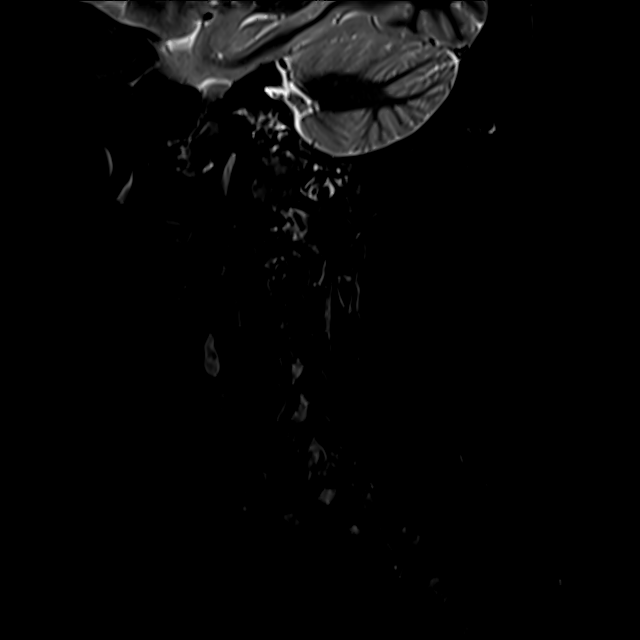

[Series 8: T2 · axial · 3.0mm · 0.50mm/px · z∈[-70,+22]mm · 9 of 30 slices shown (2 of 2)]
[im 1/30]
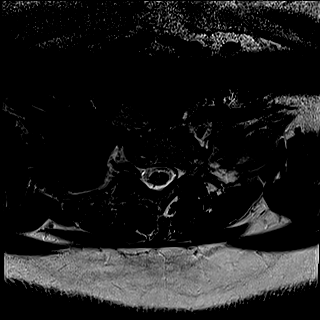
[im 5/30]
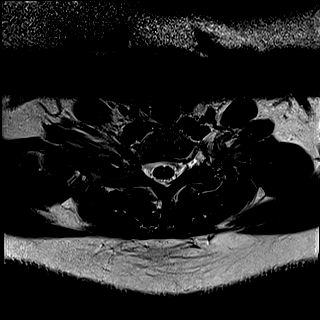
[im 10/30]
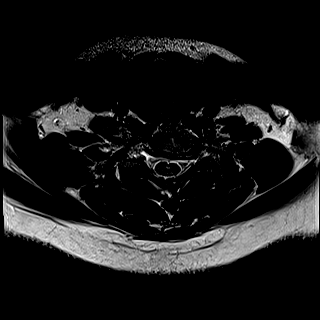
[im 13/30]
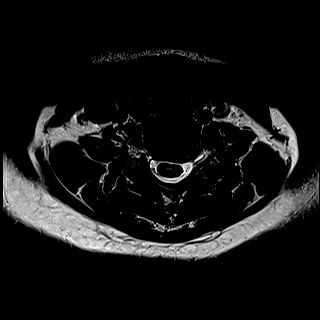
[im 15/30]
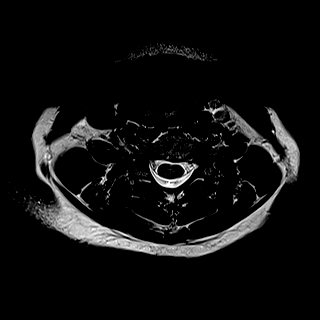
[im 17/30]
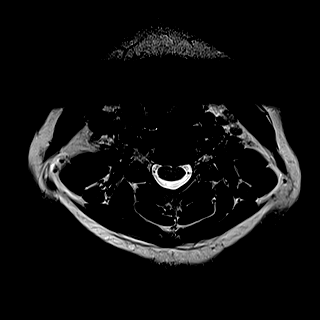
[im 20/30]
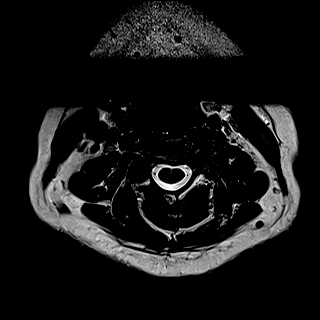
[im 25/30]
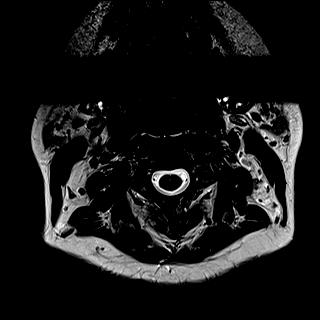
[im 30/30]
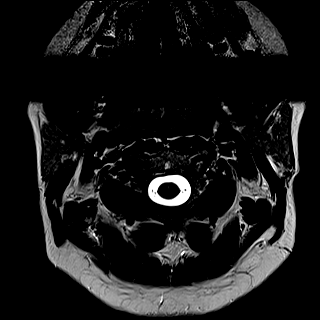

[29 of 48 positions shown; findings below may reference images not displayed]

FINDINGS: Alignment: Mild anterolisthesis C4-5 and mild retrolisthesis C5-6

Vertebrae: Normal bone marrow.  Negative for fracture or mass

Cord: Normal signal and morphology

Posterior Fossa, vertebral arteries, paraspinal tissues: Negative

Disc levels:

C2-3: Negative

C3-4: Minimal central disc protrusion.  Negative for stenosis

C4-5: Small central disc protrusion. No significant spinal or
foraminal stenosis

C5-6: Disc degeneration and diffuse uncinate spurring. Mild
foraminal narrowing bilaterally. Spinal canal adequate in size

C6-7: Central and left-sided disc protrusion. Mild left foraminal
stenosis due to spurring and disc protrusion.

C7-T1: Negative
IMPRESSION: Mild foraminal narrowing bilaterally at C6-7.

Central disc protrusion at C6-7 extending to the left foramen with
mild left foraminal stenosis.

## 2023-05-26 ENCOUNTER — Emergency Department (HOSPITAL_COMMUNITY): Payer: Medicare HMO

## 2023-05-26 ENCOUNTER — Other Ambulatory Visit: Payer: Self-pay

## 2023-05-26 ENCOUNTER — Emergency Department (HOSPITAL_COMMUNITY)
Admission: EM | Admit: 2023-05-26 | Discharge: 2023-05-26 | Disposition: A | Discharge status: Home / Self Care | Payer: Medicare HMO | Attending: Emergency Medicine | Admitting: Emergency Medicine

## 2023-05-26 DIAGNOSIS — K219 Gastro-esophageal reflux disease without esophagitis: Secondary | ICD-10-CM | POA: Insufficient documentation

## 2023-05-26 DIAGNOSIS — Z794 Long term (current) use of insulin: Secondary | ICD-10-CM | POA: Diagnosis not present

## 2023-05-26 DIAGNOSIS — R109 Unspecified abdominal pain: Secondary | ICD-10-CM | POA: Diagnosis not present

## 2023-05-26 DIAGNOSIS — R1013 Epigastric pain: Secondary | ICD-10-CM | POA: Insufficient documentation

## 2023-05-26 DIAGNOSIS — R079 Chest pain, unspecified: Secondary | ICD-10-CM | POA: Diagnosis not present

## 2023-05-26 DIAGNOSIS — R1110 Vomiting, unspecified: Secondary | ICD-10-CM | POA: Diagnosis not present

## 2023-05-26 DIAGNOSIS — R112 Nausea with vomiting, unspecified: Secondary | ICD-10-CM | POA: Diagnosis not present

## 2023-05-26 LAB — CBC
HCT: 42.2 % (ref 36.0–46.0)
Hemoglobin: 13.4 g/dL (ref 12.0–15.0)
MCH: 24.9 pg — ABNORMAL LOW (ref 26.0–34.0)
MCHC: 31.8 g/dL (ref 30.0–36.0)
MCV: 78.4 fL — ABNORMAL LOW (ref 80.0–100.0)
Platelets: 340 10*3/uL (ref 150–400)
RBC: 5.38 MIL/uL — ABNORMAL HIGH (ref 3.87–5.11)
RDW: 15.8 % — ABNORMAL HIGH (ref 11.5–15.5)
WBC: 9.5 10*3/uL (ref 4.0–10.5)
nRBC: 0 % (ref 0.0–0.2)

## 2023-05-26 LAB — COMPREHENSIVE METABOLIC PANEL
ALT: 45 U/L — ABNORMAL HIGH (ref 0–44)
AST: 25 U/L (ref 15–41)
Albumin: 4 g/dL (ref 3.5–5.0)
Alkaline Phosphatase: 88 U/L (ref 38–126)
Anion gap: 12 (ref 5–15)
BUN: 13 mg/dL (ref 6–20)
CO2: 21 mmol/L — ABNORMAL LOW (ref 22–32)
Calcium: 8.9 mg/dL (ref 8.9–10.3)
Chloride: 105 mmol/L (ref 98–111)
Creatinine, Ser: 0.9 mg/dL (ref 0.44–1.00)
GFR, Estimated: >60 mL/min (ref >60)
Glucose, Bld: 130 mg/dL — ABNORMAL HIGH (ref 70–99)
Potassium: 3.5 mmol/L (ref 3.5–5.1)
Sodium: 138 mmol/L (ref 135–145)
Total Bilirubin: 0.4 mg/dL (ref 0.3–1.2)
Total Protein: 8.4 g/dL — ABNORMAL HIGH (ref 6.5–8.1)

## 2023-05-26 LAB — I-STAT BETA HCG BLOOD, ED (MC, WL, AP ONLY): I-stat hCG, quantitative: <5 m[IU]/mL (ref <5)

## 2023-05-26 LAB — URINALYSIS, ROUTINE W REFLEX MICROSCOPIC
Bacteria, UA: NONE SEEN
Bilirubin Urine: NEGATIVE
Glucose, UA: NEGATIVE mg/dL
Hgb urine dipstick: NEGATIVE
Ketones, ur: 5 mg/dL — AB
Leukocytes,Ua: NEGATIVE
Nitrite: NEGATIVE
Protein, ur: 30 mg/dL — AB
Specific Gravity, Urine: 1.028 (ref 1.005–1.030)
pH: 5 (ref 5.0–8.0)

## 2023-05-26 LAB — LIPASE, BLOOD: Lipase: 32 U/L (ref 11–51)

## 2023-05-26 LAB — TROPONIN I (HIGH SENSITIVITY): Troponin I (High Sensitivity): 2 ng/L (ref <18)

## 2023-05-26 MED ORDER — ALUM & MAG HYDROXIDE-SIMETH 200-200-20 MG/5ML PO SUSP
30.0000 mL | Freq: Once | ORAL | Status: AC
  Administered 2023-05-26: 30 mL via ORAL
  Filled 2023-05-26: qty 30

## 2023-05-26 MED ORDER — PANTOPRAZOLE SODIUM 40 MG IV SOLR
40.0000 mg | Freq: Once | INTRAVENOUS | Status: AC
  Administered 2023-05-26: 40 mg via INTRAVENOUS
  Filled 2023-05-26: qty 10

## 2023-05-26 MED ORDER — LABETALOL HCL 5 MG/ML IV SOLN
10.0000 mg | Freq: Once | INTRAVENOUS | Status: AC
  Administered 2023-05-26: 10 mg via INTRAVENOUS
  Filled 2023-05-26: qty 4

## 2023-05-26 MED ORDER — STERILE WATER FOR INJECTION IJ SOLN
INTRAMUSCULAR | Status: AC
  Administered 2023-05-26: 10 mL
  Filled 2023-05-26: qty 10

## 2023-05-26 MED ORDER — SODIUM CHLORIDE 0.9 % IV BOLUS
1000.0000 mL | Freq: Once | INTRAVENOUS | Status: AC
  Administered 2023-05-26: 1000 mL via INTRAVENOUS

## 2023-05-26 MED ORDER — PROMETHAZINE HCL 25 MG PO TABS: 25.0000 mg | ORAL_TABLET | Freq: Four times a day (QID) | ORAL | 0 refills | Status: DC | PRN

## 2023-05-26 MED ORDER — ONDANSETRON HCL 4 MG/2ML IJ SOLN
4.0000 mg | Freq: Once | INTRAMUSCULAR | Status: AC
  Administered 2023-05-26: 4 mg via INTRAVENOUS
  Filled 2023-05-26: qty 2

## 2023-05-26 MED ORDER — LIDOCAINE VISCOUS HCL 2 % MT SOLN
15.0000 mL | Freq: Once | OROMUCOSAL | Status: AC
  Administered 2023-05-26: 15 mL via ORAL
  Filled 2023-05-26: qty 15

## 2023-05-26 MED ORDER — DIPHENHYDRAMINE HCL 50 MG/ML IJ SOLN
25.0000 mg | Freq: Once | INTRAMUSCULAR | Status: AC
  Administered 2023-05-26: 25 mg via INTRAVENOUS
  Filled 2023-05-26: qty 1

## 2023-05-26 MED ORDER — SODIUM CHLORIDE 0.9 % IV SOLN
12.5000 mg | Freq: Once | INTRAVENOUS | Status: AC
  Administered 2023-05-26: 12.5 mg via INTRAVENOUS
  Filled 2023-05-26: qty 12.5

## 2023-05-26 MED ORDER — PROMETHAZINE HCL 25 MG RE SUPP: 25.0000 mg | Freq: Four times a day (QID) | RECTAL | 0 refills | Status: DC | PRN

## 2023-05-26 MED ORDER — SODIUM CHLORIDE (PF) 0.9 % IJ SOLN
INTRAMUSCULAR | Status: AC
  Filled 2023-05-26: qty 50

## 2023-05-26 MED ORDER — IOHEXOL 300 MG/ML  SOLN
100.0000 mL | Freq: Once | INTRAMUSCULAR | Status: AC | PRN
  Administered 2023-05-26: 100 mL via INTRAVENOUS

## 2023-05-26 MED ORDER — METOCLOPRAMIDE HCL 5 MG/ML IJ SOLN
10.0000 mg | Freq: Once | INTRAMUSCULAR | Status: AC
  Administered 2023-05-26: 10 mg via INTRAVENOUS
  Filled 2023-05-26: qty 2

## 2023-05-26 NOTE — ED Provider Notes (Signed)
[place] EMERGENCY DEPARTMENT AT [facility] Provider Note   CSN: [id number] Arrival date & time: 05/26/23  7829     History  Chief Complaint  Patient presents with   Hypertension   Emesis   Abdominal Pain    [name] is a 52 y.o. female, history of irritable bowel syndrome, GERD, who presents to the ED secondary to intractable nausea, vomiting, upper abdominal pain has been going on for the last 2 days.  She states that this came on suddenly and the pain is severe, 10 out of 10, and stabbing and burning.  She denies taking any anti-inflammatories, but is on medications for her irritable bowel and GERD.  Has not been able to take her blood pressure medication for the last couple days that she just vomits everything up immediately, and so she is concerned as it has been elevated.  She denies any shortness of breath or chest pain, but states the pain is mostly in the epigastric area.  No history of any abdominal surgeries.  Pain is worse when eating.  Last bowel movement yesterday.  Home Medications Prior to Admission medications   Medication Sig Start Date End Date Taking? Authorizing Provider  hydroxychloroquine (PLAQUENIL) 200 MG tablet Take 200mg  by mouth twice daily Monday through Friday only, none on Saturday or Sunday. 04/29/23   Gearldine Bienenstock, PA-C  promethazine (PHENERGAN) 25 MG suppository Place 1 suppository (25 mg total) rectally every 6 (six) hours as needed for nausea or vomiting. 05/26/23  Yes Eri Mcevers L, PA  promethazine (PHENERGAN) 25 MG tablet Take 1 tablet (25 mg total) by mouth every 6 (six) hours as needed for nausea or vomiting. 05/26/23  Yes Trong Gosling L, PA  albuterol (VENTOLIN HFA) 108 (90 Base) MCG/ACT inhaler Inhale 2 puffs into the lungs in the morning, at noon, in the evening, and at bedtime. 04/17/23   Martina Sinner, MD  amLODipine (NORVASC) 10 MG tablet Take 10 mg by mouth daily.    [provider]   amphetamine-dextroamphetamine (ADDERALL) 10 MG tablet Take 10 mg by mouth daily with breakfast.    [provider]  Blood Glucose Monitoring Suppl (ACCU-CHEK GUIDE ME) w/Device KIT See admin instructions. 03/08/22   [provider]  buPROPion (WELLBUTRIN XL) 150 MG 24 hr tablet Take 150 mg by mouth every morning. 05/11/21   [provider]  diclofenac Sodium (VOLTAREN) 1 % GEL APPLY 2-4 GRAMS TO AFFECTED JOINT 4 TIMES DAILY AS NEEDED. 02/25/23   Gearldine Bienenstock, PA-C  escitalopram (LEXAPRO) 20 MG tablet Take 20 mg by mouth at bedtime.  11/11/19   [provider]  fluticasone (FLONASE) 50 MCG/ACT nasal spray Place 1 spray into both nostrils daily. 02/28/23   Martina Sinner, MD  fluticasone-salmeterol (ADVAIR HFA) [phone] MCG/ACT inhaler Inhale 2 puffs into the lungs 2 (two) times daily. 04/17/23   Martina Sinner, MD  gabapentin (NEURONTIN) 300 MG capsule Take 3 capsules by mouth at bedtime.    [provider]  ipratropium (ATROVENT) 0.03 % nasal spray Place 2 sprays into both nostrils every 12 (twelve) hours as needed for rhinitis.    [provider]  linaclotide (LINZESS) 72 MCG capsule Take 1 capsule (72 mcg total) by mouth every other day. Lot #[id number]  EXP: 01/2025 04/29/23 05/29/23  Charlie Pitter III, MD  metoCLOPramide (REGLAN) 5 MG tablet TAKE 1 TABLET (5 MG TOTAL) BY MOUTH EVERY 8 (EIGHT) HOURS AS NEEDED FOR NAUSEA. 03/18/23  Sherrilyn Rist, MD  naproxen (NAPROSYN) 500 MG tablet Take 500 mg by mouth as needed. 05/18/21   [provider]  omeprazole (PRILOSEC) 20 MG capsule TAKE 2 CAPSULES BY MOUTH EVERY DAY 05/07/23   Danis, Starr Lake III, MD  OZEMPIC, 0.25 OR 0.5 MG/DOSE, 2 MG/1.5ML SOPN Inject 0.25 mg into the skin at bedtime.  06/13/20   [provider]  [facility] injection  05/04/22   [provider]  tiZANidine (ZANAFLEX) 4 MG tablet TAKE 1 TABLET BY MOUTH EVERY 8 HOURS AS NEEDED FOR MUSCLE SPASMS 05/17/23    Magnant, Charles L, PA-C  traMADol (ULTRAM) 50 MG tablet Take 50 mg by mouth 2 (two) times daily.    [provider]  zolpidem (AMBIEN) 5 MG tablet TAKE 1 TABLET (5 MG TOTAL) BY MOUTH AT BEDTIME AS NEEDED FOR SLEEP. 08/10/20   Pollyann Savoy, MD      Allergies    Lisinopril, Methylphenidate, and Prednisone    Review of Systems   Review of Systems  Cardiovascular:  Negative for chest pain.  Gastrointestinal:  Positive for abdominal pain and vomiting.    Physical Exam Updated Vital Signs BP 125/76   Pulse (!) 110   Temp 98 F (36.7 C) (Oral)   Resp (!) 22   Ht 5\' 6"  (1.676 m)   Wt 97.5 kg   SpO2 96%   BMI 34.70 kg/m  Physical Exam Vitals and nursing note reviewed.  Constitutional:      General: She is not in acute distress.    Appearance: She is well-developed.  HENT:     Head: Normocephalic and atraumatic.  Eyes:     Conjunctiva/sclera: Conjunctivae normal.  Cardiovascular:     Rate and Rhythm: Normal rate and regular rhythm.     Heart sounds: No murmur heard. Pulmonary:     Effort: Pulmonary effort is normal. No respiratory distress.     Breath sounds: Normal breath sounds.  Abdominal:     Palpations: Abdomen is soft.     Tenderness: There is abdominal tenderness in the right upper quadrant, epigastric area and left upper quadrant.  Musculoskeletal:        General: No swelling.     Cervical back: Neck supple.  Skin:    General: Skin is warm and dry.     Capillary Refill: Capillary refill takes less than 2 seconds.  Neurological:     Mental Status: She is alert.  Psychiatric:        Mood and Affect: Mood normal.     ED Results / Procedures / Treatments   Labs (all labs ordered are listed, but only abnormal results are displayed) Labs Reviewed  COMPREHENSIVE METABOLIC PANEL - Abnormal; Notable for the following components:      Result Value   CO2 21 (*)    Glucose, Bld 130 (*)    Total Protein 8.4 (*)    ALT 45 (*)    All other components  within normal limits  CBC - Abnormal; Notable for the following components:   RBC 5.38 (*)    MCV 78.4 (*)    MCH 24.9 (*)    RDW 15.8 (*)    All other components within normal limits  URINALYSIS, ROUTINE W REFLEX MICROSCOPIC - Abnormal; Notable for the following components:   APPearance HAZY (*)    Ketones, ur 5 (*)    Protein, ur 30 (*)    All other components within normal limits  LIPASE, BLOOD  I-STAT  BETA HCG BLOOD, ED ([facility], [facility], [facility] ONLY)  TROPONIN I (HIGH SENSITIVITY)    EKG None  Radiology CT ABDOMEN PELVIS W CONTRAST  Result Date: 05/26/2023 CLINICAL DATA:  Epigastric pain with nausea and vomiting for 2 days EXAM: CT ABDOMEN AND PELVIS WITH CONTRAST TECHNIQUE: Multidetector CT imaging of the abdomen and pelvis was performed using the standard protocol following bolus administration of intravenous contrast. RADIATION DOSE REDUCTION: This exam was performed according to the departmental dose-optimization program which includes automated exposure control, adjustment of the mA and/or kV according to patient size and/or use of iterative reconstruction technique. CONTRAST:  OMNIPAQUE IOHEXOL 300 MG/ML  SOLN COMPARISON:  CT abdomen and pelvis 07/28/2020 and older. X-ray earlier 05/26/2019 FINDINGS: Lower chest: Minimal linear opacity left lung base likely scar or atelectasis. No pleural effusion. Hepatobiliary: Diffuse fatty liver infiltration. Patent portal vein. Gallbladder is nondilated. Pancreas: Unremarkable. No pancreatic ductal dilatation or surrounding inflammatory changes. Spleen: Normal in size without focal abnormality. Adrenals/Urinary Tract: Adrenal glands are unremarkable. Kidneys are normal, without renal calculi, focal lesion, or hydronephrosis. Bladder is contracted. Stomach/Bowel: No oral contrast. Large bowel has a normal course and caliber with mild-to-moderate stool. Few left-sided colonic diverticula. Normal retrocecal appendix. Stomach and Tanelle Lanzo bowel are nondilated.  Vascular/Lymphatic: No significant vascular findings are present. No enlarged abdominal or pelvic lymph nodes. Reproductive: Uterus and bilateral adnexa are unremarkable. Other: No abdominal wall hernia or abnormality. No abdominopelvic ascites. Musculoskeletal: No acute or significant osseous findings. IMPRESSION: Diffuse fatty liver infiltration. No bowel obstruction, free air or free fluid. Normal appendix. Few left-sided colonic diverticula. Electronically Signed   By: Karen Kays M.D.   On: 05/26/2023 14:11   DG Chest 2 View  Result Date: 05/26/2023 CLINICAL DATA:  Epigastric pain, intractable nausea/vomiting. EXAM: CHEST - 2 VIEW COMPARISON:  06/20/2020. FINDINGS: Low lung volumes accentuate the pulmonary vasculature and cardiomediastinal silhouette. No consolidation or pulmonary edema. No pleural effusion or pneumothorax. Visualized bones and upper abdomen are unremarkable. IMPRESSION: No evidence of acute cardiopulmonary disease. Electronically Signed   By: Orvan Falconer M.D.   On: 05/26/2023 11:10   DG Abdomen 1 View  Result Date: 05/26/2023 CLINICAL DATA:  Epigastric pain. EXAM: ABDOMEN - 1 VIEW COMPARISON:  None Available. FINDINGS: No gaseous bowel dilatation to suggest obstruction. Air in stool are seen scattered along the length of a nondilated colon. Visualized bony anatomy unremarkable. IMPRESSION: Nonobstructive bowel gas pattern. Electronically Signed   By: Kennith Center M.D.   On: 05/26/2023 11:09    Procedures Procedures    Medications Ordered in ED Medications  pantoprazole (PROTONIX) injection 40 mg (40 mg Intravenous Given 05/26/23 1041)  ondansetron (ZOFRAN) injection 4 mg (4 mg Intravenous Given 05/26/23 1041)  sodium chloride 0.9 % bolus 1,000 mL (1,000 mLs Intravenous New Bag/Given 05/26/23 1040)  labetalol (NORMODYNE) injection 10 mg (10 mg Intravenous Given 05/26/23 1041)  sterile water (preservative free) injection (10 mLs  Given 05/26/23 1043)  alum & mag hydroxide-simeth  (MAALOX/MYLANTA) 200-200-20 MG/5ML suspension 30 mL (30 mLs Oral Given 05/26/23 1130)    And  lidocaine (XYLOCAINE) 2 % viscous mouth solution 15 mL (15 mLs Oral Given 05/26/23 1130)  metoCLOPramide (REGLAN) injection 10 mg (10 mg Intravenous Given 05/26/23 1131)  diphenhydrAMINE (BENADRYL) injection 25 mg (25 mg Intravenous Given 05/26/23 1131)  promethazine (PHENERGAN) 12.5 mg in sodium chloride 0.9 % 50 mL IVPB (0 mg Intravenous Stopped 05/26/23 1339)  iohexol (OMNIPAQUE) 300 MG/ML solution 100 mL (100 mLs Intravenous Contrast Given 05/26/23  1340)    ED Course/ Medical Decision Making/ A&P                             Medical Decision Making Patient is a 52 year old female, here for nausea and upper abdominal pain, we will trial pantoprazole, GI cocktail, and Zofran for relief.  Obtain x-ray for evaluation for possible bowel obstruction as well as troponins, nausea, vomiting, upper abdominal pain and chest x-ray.  Amount and/or Complexity of Data Reviewed Labs: ordered.    Details: Normal troponin, no acute electrolyte abnormalities Radiology: ordered.    Details: KUB shows no obstruction, normal chest x-ray, CT abdomen pelvis, shows no acute findings, ECG/medicine tests:     Details: Normal sinus rhythm Discussion of management or test interpretation with external provider(s): Discussed with patient, after multiple antiemetics she is not feeling better so a CT abdomen pelvis was obtained, it showed no acute findings.  After promethazine, patient's symptoms resolved able to tolerate ice chips, and fluids.  Discharged home with strict return precautions.  I am suspicious that her symptoms may be secondary to impaired gastric motility, versus GERD.  I instructed her to follow-up with your primary care doctor, for further evaluation.  Risk OTC drugs. Prescription drug management.   Final Clinical Impression(s) / ED Diagnoses Final diagnoses:  Epigastric pain  Nausea and vomiting, unspecified  vomiting type    Rx / DC Orders ED Discharge Orders          Ordered    promethazine (PHENERGAN) 25 MG tablet  Every 6 hours PRN        05/26/23 1419    promethazine (PHENERGAN) 25 MG suppository  Every 6 hours PRN        05/26/23 1419              Dayquan Buys L, PA 05/26/23 1424    Sloan Leiter, DO 06/01/23 [phone]

## 2023-05-26 NOTE — ED Triage Notes (Addendum)
Pt arrived via POV. C/o N/V and abd pain for 2x days. Has been unable to take HTN medicine, because they could not keep it down.  No known sick contacts

## 2023-05-26 NOTE — Discharge Instructions (Addendum)
Today your heart enzymes, blood work, and CAT scan were all reassuring, I believe that your pain may be secondary to bad acid reflux, versus a slowed bowel.  Will need to follow-up with your primary care doctor for further evaluation.  I prescribed you Phenergan for your nausea, you can use the suppositories or the oral medication, if you are able to tolerate the oral medication.  Return to the ER if you have intractable nausea, vomiting, severe abdominal pain.

## 2023-05-27 ENCOUNTER — Other Ambulatory Visit: Payer: Self-pay | Admitting: Surgical

## 2023-05-28 ENCOUNTER — Encounter: Payer: Self-pay | Admitting: Gastroenterology

## 2023-05-28 MED ORDER — MOTEGRITY 2 MG PO TABS: 2.0000 mg | ORAL_TABLET | Freq: Every day | ORAL | 1 refills | Status: DC

## 2023-05-31 NOTE — Telephone Encounter (Addendum)
A prior authorization request has been made for Motegrity 2 mg in [email or web address]. Awaiting insurance response. Per [email or web address], "your information has been submitted to [facility]. [employer] will review the request and will issue a decision, typically within 3-7 days from your submission."  PA Case ID #: [id number]  If PA is not approved, the following medications may be covered by patient's insurance:  Medication   Prior Authorization Lactulose   Not Required Linzess   Not Required (patient has tried and failed this) Lubiprostone   Not Required Polyethylene Glycol 3350 Not Required Trulance   Required Zelnorm   Required

## 2023-05-31 NOTE — Telephone Encounter (Signed)
Dr Myrtie Neither-  Prior Berkley Harvey for Motegrity has been denied by patient's insurance. Please see note below for medications insurance will cover. Would any of those be appropriate to try the patient on?

## 2023-06-03 ENCOUNTER — Other Ambulatory Visit: Payer: Self-pay | Admitting: Physician Assistant

## 2023-06-03 NOTE — Telephone Encounter (Signed)
Last Fill: 04/29/2023 (30 day supply)  Eye exam: not on file   Labs: 05/26/2023 RBC 5.38, MCV 78.4, MCH 24.9, RDW 15.8, CO2 21, Glucose 130, Total Protein 8.4, ALT 45  Next Visit: 07/01/2023  Last Visit: 04/29/2023  [id number] connective tissue disease   Current Dose per office note 04/29/2023: plaquenil: 200 mg 1 tablet by mouth twice daily Monday through Friday.   Left message to advise patient we need her PLQ eye exam  Okay to refill Plaquenil?

## 2023-06-11 ENCOUNTER — Other Ambulatory Visit: Payer: Self-pay | Admitting: Surgical

## 2023-06-12 ENCOUNTER — Other Ambulatory Visit: Payer: Self-pay | Admitting: Physician Assistant

## 2023-06-14 DIAGNOSIS — E119 Type 2 diabetes mellitus without complications: Secondary | ICD-10-CM | POA: Diagnosis not present

## 2023-06-14 DIAGNOSIS — Z79899 Other long term (current) drug therapy: Secondary | ICD-10-CM | POA: Diagnosis not present

## 2023-06-17 NOTE — Progress Notes (Signed)
Office Visit Note  Patient: [name]             Date of Birth: [birth year]           MRN: [id number]             PCP: Deatra James, MD Referring: Deatra James, MD Visit Date: 07/01/2023 Occupation: @GUAROCC @  Subjective:  Medication monitoring   History of Present Illness: [name] is a 52 y.o. female with history of undifferentiated connective tissue disease.  Patient was started on plaquenil 200 mg 1 tablet by mouth twice daily Monday through Friday after her last office visit on 04/29/2023.  She has been tolerating Plaquenil without any side effects.  Patient states that she has noticed over 20% improvement in her joint pain, stiffness, and inflammation since initiating Plaquenil.  She states that her energy level has been stable.  She denies any joint swelling currently.  She states she has 1 nasal sore that is still healing.  She denies any sores in her mouth.  She continues to have symptoms of chronic dry mouth which have been unchanged.  She denies any recent rashes, Raynaud's phenomenon, or hair loss.  Patient states she has an upcoming appointment with pain management tomorrow.  Patient requested handicap placard to use on days that she has difficulty ambulating prolonged distances.  Patient states that she underwent a sleep study for sleep apnea workup but is waiting to hear back about the results.   Activities of Daily Living:  Patient reports morning stiffness for 20 minutes.   Patient Reports nocturnal pain.  Difficulty dressing/grooming: Denies Difficulty climbing stairs: Denies Difficulty getting out of chair: Denies Difficulty using hands for taps, buttons, cutlery, and/or writing: Denies  Review of Systems  Constitutional:  Positive for fatigue.  HENT:  Positive for mouth dryness. Negative for mouth sores and nose dryness.   Eyes:  Negative for pain, visual disturbance and dryness.  Respiratory:  Negative for cough, hemoptysis, shortness of  breath and difficulty breathing.   Cardiovascular:  Positive for palpitations. Negative for chest pain, hypertension and swelling in legs/feet.  Gastrointestinal:  Positive for constipation. Negative for blood in stool and diarrhea.  Endocrine: Positive for increased urination.  Genitourinary:  Positive for involuntary urination. Negative for painful urination.  Musculoskeletal:  Positive for joint pain, joint pain, myalgias, muscle weakness, morning stiffness, muscle tenderness and myalgias. Negative for gait problem and joint swelling.  Skin:  Positive for sensitivity to sunlight. Negative for color change, pallor, rash, hair loss, nodules/bumps, skin tightness and ulcers.  Allergic/Immunologic: Negative for susceptible to infections.  Neurological:  Positive for headaches. Negative for dizziness, numbness and weakness.  Hematological:  Positive for swollen glands.  Psychiatric/Behavioral:  Positive for sleep disturbance. Negative for depressed mood. The patient is not nervous/anxious.     PMFS History:  Patient Active Problem List   Diagnosis Date Noted   Regurgitation of food 01/23/2023   Fibromyalgia 12/25/2016   Other fatigue 12/25/2016   Primary insomnia 12/25/2016   Primary osteoarthritis of both knees 12/25/2016   ANA positive 12/25/2016   History of gastroesophageal reflux (GERD) 12/25/2016   History of anemia 12/25/2016   Trochanteric bursitis of both hips 12/25/2016   Anemia, iron deficiency 04/18/2015   Dysphagia, pharyngoesophageal phase 11/14/2011   Esophageal reflux 11/14/2011    Past Medical History:  Diagnosis Date   ADD (attention deficit disorder with hyperactivity)    Allergic rhinitis    Anxiety    Arthritis  Depression    Dysmenorrhea    Fibromyalgia    GERD (gastroesophageal reflux disease)    H. pylori infection    per patient    HLD (hyperlipidemia)    HTN (hypertension)    Insomnia    Irritable bowel syndrome    Restless leg syndrome    Sleep  apnea     Family History  Problem Relation Age of Onset   Breast cancer Maternal Grandmother    Diabetes Sister    Irritable bowel syndrome Sister    Heart disease Brother    Heart attack Brother    Stroke Mother    Heart disease Mother    Diabetes Sister    Irritable bowel syndrome Sister    Fibromyalgia Daughter    Colon cancer Neg Hx    Colon polyps Neg Hx    Esophageal cancer Neg Hx    Kidney disease Neg Hx    Gallbladder disease Neg Hx    Stomach cancer Neg Hx    Past Surgical History:  Procedure Laterality Date   BUNIONECTOMY Left 2008   DILATION AND CURETTAGE OF UTERUS  2004   EAR CYST EXCISION Left 1983   ESOPHAGEAL MANOMETRY N/A 01/02/2023   Procedure: ESOPHAGEAL MANOMETRY (EM);  Surgeon: Sherrilyn Rist, MD;  Location: [facility];  Service: Gastroenterology;  Laterality: N/A;   KNEE ARTHROSCOPY Right    with knee cap adjustment   PH IMPEDANCE STUDY N/A 01/02/2023   Procedure: PH IMPEDANCE STUDY;  Surgeon: Sherrilyn Rist, MD;  Location: [facility];  Service: Gastroenterology;  Laterality: N/A;   UPPER GASTROINTESTINAL ENDOSCOPY  06/29/2020   Social History   Social History Narrative   Not on file   Immunization History  Administered Date(s) Administered   Influenza Split 09/01/2014, 09/26/2016, 10/03/2018   Influenza, Quadrivalent, Recombinant, Inj, Pf 09/21/2020, 09/12/2021   Influenza,inj,Quad PF,6+ Mos 10/03/2018   Influenza-Unspecified 09/12/2021   PFIZER(Purple Top)SARS-COV-2 Vaccination 03/11/2020, 04/01/2020, 12/08/2020   PNEUMOCOCCAL CONJUGATE-20 05/04/2022   Zoster, Live 05/04/2022     Objective: Vital Signs: BP (!) 167/109 (BP Location: Left Arm, Patient Position: Sitting, Cuff Size: Large)   Pulse 84   Resp 16   Ht 5\' 6"  (1.676 m)   Wt 217 lb 6.4 oz (98.6 kg)   BMI 35.09 kg/m    Physical Exam Vitals and nursing note reviewed.  Constitutional:      Appearance: She is well-developed.  HENT:     Head: Normocephalic and  atraumatic.  Eyes:     Conjunctiva/sclera: Conjunctivae normal.  Cardiovascular:     Rate and Rhythm: Normal rate and regular rhythm.     Heart sounds: Normal heart sounds.  Pulmonary:     Effort: Pulmonary effort is normal.     Breath sounds: Normal breath sounds.  Abdominal:     General: Bowel sounds are normal.     Palpations: Abdomen is soft.  Musculoskeletal:     Cervical back: Normal range of motion.  Lymphadenopathy:     Cervical: No cervical adenopathy.  Skin:    General: Skin is warm and dry.     Capillary Refill: Capillary refill takes less than 2 seconds.  Neurological:     Mental Status: She is alert and oriented to person, place, and time.  Psychiatric:        Behavior: Behavior normal.      Musculoskeletal Exam: Generalized hyperalgesia and positive tender points on exam.  C-spine, thoracic spine, and lumbar spine good ROM.  Shoulder  joints, elbow joints, wrist joints, MCPs, PIPs, and DIPs good ROM with no synovitis.  Complete fist formation bilaterally.  Hip joints have good ROM with no groin pain.  Knee joints have good ROM with no warmth or effusion.  Ankle joints have good ROM with no tenderness or swelling.    CDAI Exam: CDAI Score: -- Patient Global: --; Provider Global: -- Swollen: --; Tender: -- Joint Exam 07/01/2023   No joint exam has been documented for this visit   There is currently no information documented on the homunculus. Go to the Rheumatology activity and complete the homunculus joint exam.  Investigation: No additional findings.  Imaging: No results found.  Recent Labs: Lab Results  Component Value Date   WBC 9.5 05/26/2023   HGB 13.4 05/26/2023   PLT 340 05/26/2023   NA 138 05/26/2023   K 3.5 05/26/2023   CL 105 05/26/2023   CO2 21 (L) 05/26/2023   GLUCOSE 130 (H) 05/26/2023   BUN 13 05/26/2023   CREATININE 0.90 05/26/2023   BILITOT 0.4 05/26/2023   ALKPHOS 88 05/26/2023   AST 25 05/26/2023   ALT 45 (H) 05/26/2023   PROT  8.4 (H) 05/26/2023   ALBUMIN 4.0 05/26/2023   CALCIUM 8.9 05/26/2023   GFRAA >60 12/28/2019    Speciality Comments: PLQ eye exam: 06/14/2023 normal @ [facility]. Follow up in 1 year.  Procedures:  No procedures performed Allergies: Lisinopril, Methylphenidate, and Prednisone    Assessment / Plan:     Visit Diagnoses: Undifferentiated connective tissue disease (HCC) -  Lab work from 04/24/2023 was reviewed today in the office: ANA 1: 80 NH, 1: [address], centromere antibody remains positive, RNP remains positive-1.3, CRP within normal limits, Smith antibody negative, Ro and [place] antibodies negative, RF negative, anti-CCP negative, SCL 70 negative, ESR within normal limits, dsDNA negative.  Patient has family history of morphea--sister.   Under the care of Dr. Myrtie Neither and Dr. Francine Graven. Esophageal manometry and pH monitoring was inconclusive for dysmotility.   No history of ILD confirmed. Patient was started on plaquenil 200 mg 1 tablet by mouth twice daily Monday through Friday after her last office visit on 04/29/23.  She has been tolerating Plaquenil without any side effects and has not missed any doses recently.  She has noticed about a 20% improvement in her joint pain, stiffness, and inflammation since initiating Plaquenil.  She has no synovitis on examination today.  She continues to have 1 nasal ulcer as well as chronic dry mouth but has not noticed any other new or worsening symptoms.  She has not had any symptoms of Raynaud's phenomenon, alopecia, recent rashes. Plan to update autoimmune lab work in September along with CBC and CMP.  Future orders for the following lab work will be placed today.  She will remain on Plaquenil as prescribed. She will follow up in the office in 3 months or sooner if needed.   High risk medication use - Plaquenil: 200 mg 1 tablet by mouth twice daily Monday through Friday.  CBC and CMP updated on 05/26/23. Her next lab work will be due in September and then every  5 months.  PLQ eye exam: 06/14/2023 normal @ [facility]. Follow up in 1 year.   Elevated sed rate: ESR 17 on 04/24/23.   H/O multiple pulmonary nodules - Dr. Francine Graven. HRCT 05/17/2021 subpleural nodule left upper lobe.Chest CT 06/27/2022:New 5 mm part-solid pulmonary nodule LUL.PFTs 4/24/24mild obstructive airways disease with significant bronchodilator response--starting  Advair.  .   Fibromyalgia: Patient continues to experience generalized myalgias and muscle tenderness due to fibromyalgia. She has an upcoming appointment with pain management tomorrow to establish care.   Trochanteric bursitis of both hips: She has good ROM of both hips with no groin pain currently.  Intermittent discomfort in both hips secondary to trochanteric bursitis.   Primary osteoarthritis of both knees: Patient was given a form for a temporary handicap placard.  Other fatigue: Chronic, stable.   Primary insomnia: She experiences nocturnal pain secondary to insomnia.   Essential hypertension: Blood pressure was severely elevated today in the office.  Her blood pressure was rechecked prior to leaving.  Patient states that her blood pressure has been significantly elevated over the past few weeks despite taking amlodipine as prescribed.  Patient was advised to be evaluated by her PCP today.  Other medical conditions are listed as follows:   History of diabetes mellitus  History of gastroesophageal reflux (GERD)  Vitamin D deficiency  History of anemia  Family history of morphea-Sister  Orders: Orders Placed This Encounter  Procedures   Protein / creatinine ratio, urine   COMPLETE METABOLIC PANEL WITH GFR   CBC with Differential/Platelet   Anti-DNA antibody, double-stranded   Sedimentation rate   C3 and C4   ANA   RNP Antibody   No orders of the defined types were placed in this encounter.    Follow-Up Instructions: Return in about 3 months (around 10/01/2023) for UCTD.   Gearldine Bienenstock,  PA-C  Note - This record has been created using Dragon software.  Chart creation errors have been sought, but may not always  have been located. Such creation errors do not reflect on  the standard of medical care.

## 2023-06-24 ENCOUNTER — Other Ambulatory Visit: Payer: Self-pay | Admitting: Surgical

## 2023-07-01 ENCOUNTER — Encounter: Payer: Self-pay | Admitting: Physician Assistant

## 2023-07-01 ENCOUNTER — Ambulatory Visit: Payer: Medicare HMO | Attending: Physician Assistant | Admitting: Physician Assistant

## 2023-07-01 VITALS — BP 167/109 | HR 84 | Resp 16 | Ht 66.0 in | Wt 217.4 lb

## 2023-07-01 DIAGNOSIS — M7062 Trochanteric bursitis, left hip: Secondary | ICD-10-CM

## 2023-07-01 DIAGNOSIS — M7061 Trochanteric bursitis, right hip: Secondary | ICD-10-CM | POA: Diagnosis not present

## 2023-07-01 DIAGNOSIS — R700 Elevated erythrocyte sedimentation rate: Secondary | ICD-10-CM

## 2023-07-01 DIAGNOSIS — Z862 Personal history of diseases of the blood and blood-forming organs and certain disorders involving the immune mechanism: Secondary | ICD-10-CM

## 2023-07-01 DIAGNOSIS — Z87898 Personal history of other specified conditions: Secondary | ICD-10-CM

## 2023-07-01 DIAGNOSIS — M170 Bilateral primary osteoarthritis of knee: Secondary | ICD-10-CM

## 2023-07-01 DIAGNOSIS — Z8719 Personal history of other diseases of the digestive system: Secondary | ICD-10-CM

## 2023-07-01 DIAGNOSIS — Z79899 Other long term (current) drug therapy: Secondary | ICD-10-CM

## 2023-07-01 DIAGNOSIS — I10 Essential (primary) hypertension: Secondary | ICD-10-CM

## 2023-07-01 DIAGNOSIS — F5101 Primary insomnia: Secondary | ICD-10-CM

## 2023-07-01 DIAGNOSIS — M797 Fibromyalgia: Secondary | ICD-10-CM

## 2023-07-01 DIAGNOSIS — R5383 Other fatigue: Secondary | ICD-10-CM | POA: Diagnosis not present

## 2023-07-01 DIAGNOSIS — M359 Systemic involvement of connective tissue, unspecified: Secondary | ICD-10-CM

## 2023-07-01 DIAGNOSIS — E559 Vitamin D deficiency, unspecified: Secondary | ICD-10-CM

## 2023-07-01 DIAGNOSIS — Z8639 Personal history of other endocrine, nutritional and metabolic disease: Secondary | ICD-10-CM

## 2023-07-01 DIAGNOSIS — Z840 Family history of diseases of the skin and subcutaneous tissue: Secondary | ICD-10-CM

## 2023-07-01 NOTE — Patient Instructions (Signed)
Standing Labs We placed an order today for your standing lab work.   Please have your standing labs drawn in September and then every 5 months   Please have your labs drawn 2 weeks prior to your appointment so that the provider can discuss your lab results at your appointment, if possible.  Please note that you may see your imaging and lab results in MyChart before we have reviewed them. We will contact you once all results are reviewed. Please allow our office up to 72 hours to thoroughly review all of the results before contacting the office for clarification of your results.  WALK-IN LAB HOURS  Monday through Thursday from 8:00 am -12:30 pm and 1:00 pm-5:00 pm and Friday from 8:00 am-12:00 pm.  Patients with office visits requiring labs will be seen before walk-in labs.  You may encounter longer than normal wait times. Please allow additional time. Wait times may be shorter on  Monday and Thursday afternoons.  We do not book appointments for walk-in labs. We appreciate your patience and understanding with our staff.   Labs are drawn by [facility]. Please bring your co-pay at the time of your lab draw.  You may receive a bill from [facility] for your lab work.  Please note if you are on Hydroxychloroquine and and an order has been placed for a Hydroxychloroquine level,  you will need to have it drawn 4 hours or more after your last dose.  If you wish to have your labs drawn at another location, please call the office 24 hours in advance so we can fax the orders.  The office is located at [address], [place], [place] [address]   If you have any questions regarding directions or hours of operation,  please call [phone].   As a reminder, please drink plenty of water prior to coming for your lab work. Thanks!

## 2023-07-02 ENCOUNTER — Encounter: Payer: Medicare HMO | Attending: Physical Medicine & Rehabilitation | Admitting: Physical Medicine & Rehabilitation

## 2023-07-04 ENCOUNTER — Other Ambulatory Visit: Payer: Self-pay | Admitting: Surgical

## 2023-07-05 ENCOUNTER — Other Ambulatory Visit: Payer: Self-pay | Admitting: Physician Assistant

## 2023-07-05 NOTE — Telephone Encounter (Signed)
Last Fill: 06/03/2023 (30 day supply)  Eye exam: 06/14/2023 normal   Labs: 05/26/2023 CO2 21, Glucose 130, Total Protein 8.4, ALT 45, RBC 5.38, MCV 78.4, MCH 24.9, RDW 15.8  Next Visit: 10/01/2023  Last Visit: 07/01/2023  TD:[id number] connective tissue disease   Current Dose per office note 07/01/2023: Plaquenil: 200 mg 1 tablet by mouth twice daily Monday through Friday.   Okay to refill Plaquenil?

## 2023-07-11 ENCOUNTER — Encounter: Payer: Self-pay | Admitting: Physician Assistant

## 2023-07-17 ENCOUNTER — Other Ambulatory Visit: Payer: Self-pay | Admitting: Surgical

## 2023-07-24 ENCOUNTER — Ambulatory Visit (HOSPITAL_COMMUNITY)
Admission: RE | Admit: 2023-07-24 | Discharge: 2023-07-24 | Disposition: A | Discharge status: Home / Self Care | Payer: Medicare HMO | Source: Ambulatory Visit | Attending: Pulmonary Disease | Admitting: Pulmonary Disease

## 2023-07-24 DIAGNOSIS — J849 Interstitial pulmonary disease, unspecified: Secondary | ICD-10-CM | POA: Diagnosis not present

## 2023-07-24 DIAGNOSIS — R768 Other specified abnormal immunological findings in serum: Secondary | ICD-10-CM | POA: Insufficient documentation

## 2023-07-24 DIAGNOSIS — R918 Other nonspecific abnormal finding of lung field: Secondary | ICD-10-CM | POA: Insufficient documentation

## 2023-07-24 DIAGNOSIS — R590 Localized enlarged lymph nodes: Secondary | ICD-10-CM | POA: Diagnosis not present

## 2023-07-27 ENCOUNTER — Other Ambulatory Visit: Payer: Self-pay | Admitting: Surgical

## 2023-07-29 DIAGNOSIS — R000 Tachycardia, unspecified: Secondary | ICD-10-CM | POA: Diagnosis not present

## 2023-07-29 DIAGNOSIS — I10 Essential (primary) hypertension: Secondary | ICD-10-CM | POA: Diagnosis not present

## 2023-08-01 DIAGNOSIS — Z0100 Encounter for examination of eyes and vision without abnormal findings: Secondary | ICD-10-CM | POA: Diagnosis not present

## 2023-08-06 ENCOUNTER — Other Ambulatory Visit: Payer: Self-pay | Admitting: Surgical

## 2023-08-14 ENCOUNTER — Other Ambulatory Visit: Payer: Self-pay | Admitting: Surgical

## 2023-08-22 ENCOUNTER — Other Ambulatory Visit: Payer: Self-pay | Admitting: Surgical

## 2023-08-27 ENCOUNTER — Encounter: Payer: Self-pay | Admitting: Cardiology

## 2023-08-27 ENCOUNTER — Ambulatory Visit: Payer: Medicare HMO | Admitting: Cardiology

## 2023-08-27 NOTE — Telephone Encounter (Signed)
From patient.

## 2023-08-28 ENCOUNTER — Other Ambulatory Visit: Payer: Self-pay | Admitting: Rheumatology

## 2023-09-01 ENCOUNTER — Other Ambulatory Visit: Payer: Self-pay | Admitting: Physician Assistant

## 2023-09-02 NOTE — Telephone Encounter (Signed)
Last Fill: 02/25/2023  Next Visit: 10/01/2023  Last Visit: 07/01/2023  Dx:  Primary osteoarthritis of both knees   Current Dose per office note on 07/01/2023: not discussed  Okay to refill Voltaren Gel?

## 2023-09-05 ENCOUNTER — Other Ambulatory Visit: Payer: Self-pay | Admitting: Surgical

## 2023-09-16 ENCOUNTER — Other Ambulatory Visit: Payer: Self-pay | Admitting: Surgical

## 2023-09-16 NOTE — Progress Notes (Unsigned)
Cardiology Office Note:    Date:  09/16/2023  NAME:  [name]    MRN: [id number] DOB:  1971/07/31   PCP:  Deatra James, MD  Former Cardiology Providers: *** Primary Cardiologist:  Tessa Lerner, DO, [facility] (established care ***)  Electrophysiologist:  None   Referring MD: Deatra James, MD   No chief complaint on file. ***  History of Present Illness:    [name] is a 52 y.o. female whose past medical history and cardiovascular risk factors includes: ***. She is being seen today for the evaluation of tachycardia at the request of Dr. Wynelle Link.  ***  Factors to consider: History of thyroid disease: *** History of anemia: *** Coffee consumption: *** Caffeinated beverages/sodas: *** Energy drinks: *** Stimulants: *** Weight loss supplements: *** New over-the-counter medications: *** Herbal supplements: *** Any prior workup: ***  Current Medications: No outpatient medications have been marked as taking for the 09/17/23 encounter (Appointment) with Odis Hollingshead, Grace Haggart, DO.     Allergies:    Lisinopril, Methylphenidate, and Prednisone   Past Medical History: Past Medical History:  Diagnosis Date   ADD (attention deficit disorder with hyperactivity)    Allergic rhinitis    Anxiety    Arthritis    Depression    Dysmenorrhea    Fibromyalgia    GERD (gastroesophageal reflux disease)    H. pylori infection    per patient    HLD (hyperlipidemia)    HTN (hypertension)    Insomnia    Irritable bowel syndrome    Restless leg syndrome    Sleep apnea     Past Surgical History: Past Surgical History:  Procedure Laterality Date   BUNIONECTOMY Left 2008   DILATION AND CURETTAGE OF UTERUS  2004   EAR CYST EXCISION Left 1983   ESOPHAGEAL MANOMETRY N/A 01/02/2023   Procedure: ESOPHAGEAL MANOMETRY (EM);  Surgeon: Sherrilyn Rist, MD;  Location: [facility];  Service: Gastroenterology;  Laterality: N/A;   KNEE ARTHROSCOPY Right    with knee cap adjustment    PH IMPEDANCE STUDY N/A 01/02/2023   Procedure: PH IMPEDANCE STUDY;  Surgeon: Sherrilyn Rist, MD;  Location: [facility];  Service: Gastroenterology;  Laterality: N/A;   UPPER GASTROINTESTINAL ENDOSCOPY  06/29/2020    Social History: Social History   Tobacco Use   Smoking status: Former    Current packs/day: 0.00    Average packs/day: 0.1 packs/day for 12.0 years (1.2 ttl pk-yrs)    Types: Cigarettes    Start date: 12/27/1981    Quit date: 12/27/1993    Years since quitting: 29.7    Passive exposure: Never   Smokeless tobacco: Never  Vaping Use   Vaping status: Never Used  Substance Use Topics   Alcohol use: No   Drug use: No   Family History: Family History  Problem Relation Age of Onset   Breast cancer Maternal Grandmother    Diabetes Sister    Irritable bowel syndrome Sister    Heart disease Brother    Heart attack Brother    Stroke Mother    Heart disease Mother    Diabetes Sister    Irritable bowel syndrome Sister    Fibromyalgia Daughter    Colon cancer Neg Hx    Colon polyps Neg Hx    Esophageal cancer Neg Hx    Kidney disease Neg Hx    Gallbladder disease Neg Hx    Stomach cancer Neg Hx    ROS:   Review of  Systems  Cardiovascular:  Negative for chest pain, claudication, dyspnea on exertion, irregular heartbeat, leg swelling, near-syncope, orthopnea, palpitations, paroxysmal nocturnal dyspnea and syncope.  Respiratory:  Negative for shortness of breath.   Hematologic/Lymphatic: Negative for bleeding problem.  Musculoskeletal:  Negative for muscle cramps and myalgias.  Neurological:  Negative for dizziness and light-headedness.    EKGs/Labs/Other Studies Reviewed:   The ekg ordered today was personally reviewed by me. *** EKG Interpretation Date/Time:    Ventricular Rate:    PR Interval:    QRS Duration:    QT Interval:    QTC Calculation:   R Axis:      Text Interpretation:          ***  Labs:    Latest Ref Rng & Units 05/26/2023    10:12 AM 04/24/2023    2:01 PM 07/01/2022   12:46 PM  CBC  WBC 4.0 - 10.5 K/uL 9.5  8.5  5.9   Hemoglobin 12.0 - 15.0 g/dL 11.9  14.7  82.9   Hematocrit 36.0 - 46.0 % 42.2  42.4  37.5   Platelets 150 - 400 K/uL 340  400  316        Latest Ref Rng & Units 05/26/2023   10:12 AM 04/24/2023    2:01 PM 07/01/2022   12:46 PM  BMP  Glucose 70 - 99 mg/dL 562  130  865   BUN 6 - 20 mg/dL 13  9  9    Creatinine 0.44 - 1.00 mg/dL 7.84  6.96  2.95   BUN/Creat Ratio 6 - 22 (calc)  SEE NOTE:    Sodium 135 - 145 mmol/L 138  141  140   Potassium 3.5 - 5.1 mmol/L 3.5  4.2  3.3   Chloride 98 - 111 mmol/L 105  103  107   CO2 22 - 32 mmol/L 21  26  24    Calcium 8.9 - 10.3 mg/dL 8.9  9.8  8.7       Latest Ref Rng & Units 05/26/2023   10:12 AM 04/24/2023    2:01 PM 07/19/2022   11:38 AM  CMP  Glucose 70 - 99 mg/dL 284  132    BUN 6 - 20 mg/dL 13  9    Creatinine 4.40 - 1.00 mg/dL 1.02  7.25    Sodium 366 - 145 mmol/L 138  141    Potassium 3.5 - 5.1 mmol/L 3.5  4.2    Chloride 98 - 111 mmol/L 105  103    CO2 22 - 32 mmol/L 21  26    Calcium 8.9 - 10.3 mg/dL 8.9  9.8    Total Protein 6.5 - 8.1 g/dL 8.4  8.2  7.9   Total Bilirubin 0.3 - 1.2 mg/dL 0.4  0.5    Alkaline Phos 38 - 126 U/L 88     AST 15 - 41 U/L 25  12    ALT 0 - 44 U/L 45  14      No results found for: "CHOL", "HDL", "LDLCALC", "LDLDIRECT", "TRIG", "CHOLHDL" No results for input(s): "LIPOA" in the last 8760 hours. No components found for: "NTPROBNP" No results for input(s): "PROBNP" in the last 8760 hours. No results for input(s): "TSH" in the last 8760 hours.  Physical Exam:   There were no vitals filed for this visit. There is no height or weight on file to calculate BMI. Wt Readings from Last 3 Encounters:  07/01/23 217 lb 6.4 oz (98.6 kg)  05/26/23 215  lb (97.5 kg)  04/29/23 212 lb 6.4 oz (96.3 kg)    Physical Exam  Constitutional: No distress.  Age appropriate, hemodynamically stable.   Neck: No JVD present.   Cardiovascular: Normal rate, regular rhythm, S1 normal, S2 normal, intact distal pulses and normal pulses. Exam reveals no gallop, no S3 and no S4.  No murmur heard. Pulmonary/Chest: Effort normal and breath sounds normal. No stridor. She has no wheezes. She has no rales.  Abdominal: Soft. Bowel sounds are normal. She exhibits no distension. There is no abdominal tenderness.  Musculoskeletal:        General: No edema.     Cervical back: Neck supple.  Neurological: She is alert and oriented to person, place, and time. She has intact cranial nerves (2-12).  Skin: Skin is warm and moist.     Impression & Recommendation(s):  Impression:   ICD-10-CM   1. Tachycardia  R00.0     2. Benign hypertension  I10     3. Attention deficit hyperactivity disorder (ADHD), unspecified ADHD type  F90.9     4. Former smoker  Z87.891        Recommendation(s):  Tachycardia ***  Benign hypertension ***  Attention deficit hyperactivity disorder (ADHD), unspecified ADHD type ***  Former smoker ***  Orders Placed:  No orders of the defined types were placed in this encounter.   As part of medical decision making results of the *** were reviewed independently at today's visit.   Final Medication List:   No orders of the defined types were placed in this encounter.   There are no discontinued medications.   Current Outpatient Medications:    albuterol (VENTOLIN HFA) 108 (90 Base) MCG/ACT inhaler, Inhale 2 puffs into the lungs in the morning, at noon, in the evening, and at bedtime., Disp: 8 g, Rfl: 11   amLODipine (NORVASC) 10 MG tablet, Take 10 mg by mouth daily., Disp: , Rfl:    amphetamine-dextroamphetamine (ADDERALL) 10 MG tablet, Take 10 mg by mouth daily with breakfast., Disp: , Rfl:    Blood Glucose Monitoring Suppl (ACCU-CHEK GUIDE ME) w/Device KIT, See admin instructions., Disp: , Rfl:    buPROPion (WELLBUTRIN XL) 150 MG 24 hr tablet, Take 150 mg by mouth every morning., Disp: ,  Rfl:    diclofenac Sodium (VOLTAREN) 1 % GEL, APPLY 2-4 GRAMS TO AFFECTED JOINT 4 TIMES DAILY AS NEEDED., Disp: 400 g, Rfl: 1   escitalopram (LEXAPRO) 20 MG tablet, Take 20 mg by mouth at bedtime. , Disp: , Rfl:    fluticasone (FLONASE) 50 MCG/ACT nasal spray, Place 1 spray into both nostrils daily., Disp: 16 mL, Rfl: 2   fluticasone-salmeterol (ADVAIR HFA) 115-21 MCG/ACT inhaler, Inhale 2 puffs into the lungs 2 (two) times daily., Disp: 1 each, Rfl: 12   gabapentin (NEURONTIN) 300 MG capsule, Take 3 capsules by mouth at bedtime., Disp: , Rfl:    hydroxychloroquine (PLAQUENIL) 200 MG tablet, TAKE 1 TABLET BY MOUTH TWICE DAILY MONDAY THROUGH FRIDAY ONLY (NONE ON SATURDAY OR SUNDAY), Disp: 120 tablet, Rfl: 0   ipratropium (ATROVENT) 0.03 % nasal spray, Place 2 sprays into both nostrils every 12 (twelve) hours as needed for rhinitis., Disp: , Rfl:    metoCLOPramide (REGLAN) 5 MG tablet, TAKE 1 TABLET (5 MG TOTAL) BY MOUTH EVERY 8 (EIGHT) HOURS AS NEEDED FOR NAUSEA., Disp: 60 tablet, Rfl: 2   naproxen (NAPROSYN) 500 MG tablet, Take 500 mg by mouth as needed., Disp: , Rfl:    omeprazole (PRILOSEC) 20  MG capsule, TAKE 2 CAPSULES BY MOUTH EVERY DAY, Disp: 180 capsule, Rfl: 1   OZEMPIC, 0.25 OR 0.5 MG/DOSE, 2 MG/1.5ML SOPN, Inject 0.25 mg into the skin at bedtime. , Disp: , Rfl:    promethazine (PHENERGAN) 25 MG suppository, Place 1 suppository (25 mg total) rectally every 6 (six) hours as needed for nausea or vomiting. (Patient not taking: Reported on 07/01/2023), Disp: 12 each, Rfl: 0   promethazine (PHENERGAN) 25 MG tablet, Take 1 tablet (25 mg total) by mouth every 6 (six) hours as needed for nausea or vomiting. (Patient not taking: Reported on 07/01/2023), Disp: 12 tablet, Rfl: 0   Prucalopride Succinate (MOTEGRITY) 2 MG TABS, Take 1 tablet (2 mg total) by mouth daily at 8 pm. (Patient not taking: Reported on 07/01/2023), Disp: 30 tablet, Rfl: 1   SHINGRIX injection, , Disp: , Rfl:    tiZANidine (ZANAFLEX)  4 MG tablet, TAKE 1 TABLET BY MOUTH EVERY 8 HOURS AS NEEDED FOR MUSCLE SPASM, Disp: 30 tablet, Rfl: 0   traMADol (ULTRAM) 50 MG tablet, Take 50 mg by mouth 2 (two) times daily., Disp: , Rfl:    zolpidem (AMBIEN) 5 MG tablet, TAKE 1 TABLET (5 MG TOTAL) BY MOUTH AT BEDTIME AS NEEDED FOR SLEEP., Disp: 30 tablet, Rfl: 0  Consent:      {Are you ordering a CV Procedure (e.g. stress test, cath, DCCV, TEE, etc)?   Press F2        :[id number]}   Disposition:   No follow-ups on file. or sooner if needed.  Her questions and concerns were addressed to her satisfaction. She voices understanding of the recommendations provided during this encounter.    Signed, Tessa Lerner, DO, [facility] [place]  [facility]  [address] [place], [place] [address] [phone] 09/16/2023 9:53 PM

## 2023-09-17 ENCOUNTER — Encounter: Payer: Self-pay | Admitting: Cardiology

## 2023-09-17 ENCOUNTER — Ambulatory Visit: Payer: Medicare HMO | Attending: Cardiology | Admitting: Cardiology

## 2023-09-17 ENCOUNTER — Ambulatory Visit: Payer: Medicare HMO | Attending: Cardiology

## 2023-09-17 VITALS — BP 110/78 | HR 83 | Resp 16 | Ht 66.0 in | Wt 222.0 lb

## 2023-09-17 DIAGNOSIS — Z8249 Family history of ischemic heart disease and other diseases of the circulatory system: Secondary | ICD-10-CM | POA: Diagnosis not present

## 2023-09-17 DIAGNOSIS — E119 Type 2 diabetes mellitus without complications: Secondary | ICD-10-CM

## 2023-09-17 DIAGNOSIS — I10 Essential (primary) hypertension: Secondary | ICD-10-CM | POA: Diagnosis not present

## 2023-09-17 DIAGNOSIS — Z87891 Personal history of nicotine dependence: Secondary | ICD-10-CM

## 2023-09-17 DIAGNOSIS — R0609 Other forms of dyspnea: Secondary | ICD-10-CM | POA: Diagnosis not present

## 2023-09-17 DIAGNOSIS — R002 Palpitations: Secondary | ICD-10-CM | POA: Diagnosis not present

## 2023-09-17 DIAGNOSIS — R000 Tachycardia, unspecified: Secondary | ICD-10-CM

## 2023-09-17 DIAGNOSIS — F909 Attention-deficit hyperactivity disorder, unspecified type: Secondary | ICD-10-CM | POA: Diagnosis not present

## 2023-09-17 NOTE — Patient Instructions (Signed)
Medication Instructions:  Your physician recommends that you continue on your current medications as directed. Please refer to the Current Medication list given to you today.  *If you need a refill on your cardiac medications before your next appointment, please call your pharmacy*   Lab Work: Please complete a HGB and a TSH here in our office before you leave today.  If you have labs (blood work) drawn today and your tests are completely normal, you will receive your results only by: MyChart Message (if you have MyChart) OR A paper copy in the mail If you have any lab test that is abnormal or we need to change your treatment, we will call you to review the results.   Testing/Procedures:  Your physician has requested that you have an echocardiogram. Echocardiography is a painless test that uses sound waves to create images of your heart. It provides your doctor with information about the size and shape of your heart and how well your heart's chambers and valves are working. This procedure takes approximately one hour. There are no restrictions for this procedure. Please do NOT wear cologne, perfume, aftershave, or lotions (deodorant is allowed). Please arrive 15 minutes prior to your appointment time.  Your doctor has ordered a coronary calcium score CT for you. This is a CT scan that can help with early detection of coronary artery disease. Patient cost is usually $94-$99.   ZIO XT- Long Term Monitor Instructions  Your physician has requested you wear a ZIO patch monitor for 14 days.  This is a single patch monitor. [employer] supplies one patch monitor per enrollment. Additional stickers are not available. Please do not apply patch if you will be having a Nuclear Stress Test,  Echocardiogram, Cardiac CT, MRI, or Chest Xray during the period you would be wearing the  monitor. The patch cannot be worn during these tests. You cannot remove and re-apply the  ZIO XT patch monitor.  Your ZIO  patch monitor will be mailed 3 day USPS to your address on file. It may take 3-5 days  to receive your monitor after you have been enrolled.  Once you have received your monitor, please review the enclosed instructions. Your monitor  has already been registered assigning a specific monitor serial # to you.  Billing and Patient Assistance Program Information  We have supplied [employer] with any of your insurance information on file for billing purposes. [employer] offers a sliding scale Patient Assistance Program for patients that do not have  insurance, or whose insurance does not completely cover the cost of the ZIO monitor.  You must apply for the Patient Assistance Program to qualify for this discounted rate.  To apply, please call [employer] at [phone], select option 4, select option 2, ask to apply for  Patient Assistance Program. Meredeth Ide will ask your household income, and how many people  are in your household. They will quote your out-of-pocket cost based on that information.  [employer] will also be able to set up a 55-month, interest-free payment plan if needed.  Applying the monitor   Shave hair from upper left chest.  Hold abrader disc by orange tab. Rub abrader in 40 strokes over the upper left chest as  indicated in your monitor instructions.  Clean area with 4 enclosed alcohol pads. Let dry.  Apply patch as indicated in monitor instructions. Patch will be placed under collarbone on left  side of chest with arrow pointing upward.  Rub patch adhesive wings for 2 minutes. Remove  white label marked "1". Remove the white  label marked "2". Rub patch adhesive wings for 2 additional minutes.  While looking in a mirror, press and release button in center of patch. A small green light will  flash 3-4 times. This will be your only indicator that the monitor has been turned on.  Do not shower for the first 24 hours. You may shower after the first 24 hours.  Press the button if you feel a  symptom. You will hear a small click. Record Date, Time and  Symptom in the Patient Logbook.  When you are ready to remove the patch, follow instructions on the last 2 pages of Patient  Logbook. Stick patch monitor onto the last page of Patient Logbook.  Place Patient Logbook in the blue and white box. Use locking tab on box and tape box closed  securely. The blue and white box has prepaid postage on it. Please place it in the mailbox as  soon as possible. Your physician should have your test results approximately 7 days after the  monitor has been mailed back to [facility].  Call [facility] Customer Care at [phone] if you have questions regarding  your ZIO XT patch monitor. Call them immediately if you see an orange light blinking on your  monitor.  If your monitor falls off in less than 4 days, contact our Monitor department at [phone].  If your monitor becomes loose or falls off after 4 days call [employer] at [phone] for  suggestions on securing your monitor     Follow-Up: At [facility] you and your health needs are our priority.  As part of our continuing mission to provide you with exceptional heart care, we have created designated Provider Care Teams.  These Care Teams include your primary Cardiologist (physician) and Advanced Practice Providers (APPs -  Physician Assistants and Nurse Practitioners) who all work together to provide you with the care you need, when you need it.  We recommend signing up for the patient portal called "MyChart".  Sign up information is provided on this After Visit Summary.  MyChart is used to connect with patients for Virtual Visits (Telemedicine).  Patients are able to view lab/test results, encounter notes, upcoming appointments, etc.  Non-urgent messages can be sent to your provider as well.   To learn more about what you can do with MyChart, go to [email or web address].    Your next appointment:   6  week(s)  Provider:   Tessa Lerner, DO

## 2023-09-17 NOTE — Progress Notes (Unsigned)
Enrolled for [employer] to mail a ZIO XT long term holter monitor to the patients address on file.  

## 2023-09-17 NOTE — Progress Notes (Deleted)
Office Visit Note  Patient: [name]             Date of Birth: 23-Nov-1971           MRN: [id number]             PCP: Deatra James, MD Referring: Deatra James, MD Visit Date: 10/01/2023 Occupation: @GUAROCC @  Subjective:    History of Present Illness: [name] is a 52 y.o. female with history of undifferentiated connective tissue disease.  Patient remains on plaquenil 200 mg 1 tablet by mouth twice daily Monday through Friday.    CBC and CMP updated on 05/26/23.    04/24/2023 was reviewed today in the office: ANA 1: 80 [place], 1: [address] [place], centromere antibody remains positive, RNP remains positive-1.3, CRP within normal limits, Smith antibody negative, Ro and [place] antibodies negative, RF negative, anti-CCP negative, SCL 70 negative, ESR within normal limits, dsDNA negative.  Patient has family history of morphea--sister.   Under the care of Dr. Myrtie Neither and Dr. Francine Graven. Esophageal manometry and pH monitoring was inconclusive for dysmotility.   No history of ILD confirmed. Patient was started on plaquenil 200 mg 1 tablet by mouth twice daily Monday through Friday after her last office visit on 04/29/23   Activities of Daily Living:  Patient reports morning stiffness for *** {minute/hour:19697}.   Patient {ACTIONS;DENIES/REPORTS:21021675::"Denies"} nocturnal pain.  Difficulty dressing/grooming: {ACTIONS;DENIES/REPORTS:21021675::"Denies"} Difficulty climbing stairs: {ACTIONS;DENIES/REPORTS:21021675::"Denies"} Difficulty getting out of chair: {ACTIONS;DENIES/REPORTS:21021675::"Denies"} Difficulty using hands for taps, buttons, cutlery, and/or writing: {ACTIONS;DENIES/REPORTS:21021675::"Denies"}  No Rheumatology ROS completed.   PMFS History:  Patient Active Problem List   Diagnosis Date Noted   Regurgitation of food 01/23/2023   Fibromyalgia 12/25/2016   Other fatigue 12/25/2016   Primary insomnia 12/25/2016   Primary osteoarthritis of both knees 12/25/2016   ANA  positive 12/25/2016   History of gastroesophageal reflux (GERD) 12/25/2016   History of anemia 12/25/2016   Trochanteric bursitis of both hips 12/25/2016   Anemia, iron deficiency 04/18/2015   Dysphagia, pharyngoesophageal phase 11/14/2011   Esophageal reflux 11/14/2011    Past Medical History:  Diagnosis Date   ADD (attention deficit disorder with hyperactivity)    Allergic rhinitis    Anxiety    Arthritis    Depression    Dysmenorrhea    Fibromyalgia    GERD (gastroesophageal reflux disease)    H. pylori infection    per patient    HLD (hyperlipidemia)    HTN (hypertension)    Insomnia    Irritable bowel syndrome    Restless leg syndrome    Sleep apnea     Family History  Problem Relation Age of Onset   Breast cancer Maternal Grandmother    Diabetes Sister    Irritable bowel syndrome Sister    Heart disease Brother    Heart attack Brother    Stroke Mother    Heart disease Mother    Diabetes Sister    Irritable bowel syndrome Sister    Fibromyalgia Daughter    Colon cancer Neg Hx    Colon polyps Neg Hx    Esophageal cancer Neg Hx    Kidney disease Neg Hx    Gallbladder disease Neg Hx    Stomach cancer Neg Hx    Past Surgical History:  Procedure Laterality Date   BUNIONECTOMY Left 2008   DILATION AND CURETTAGE OF UTERUS  2004   EAR CYST EXCISION Left 1983   ESOPHAGEAL MANOMETRY N/A 01/02/2023   Procedure: ESOPHAGEAL MANOMETRY (EM);  Surgeon: Sherrilyn Rist, MD;  Location: [facility];  Service: Gastroenterology;  Laterality: N/A;   KNEE ARTHROSCOPY Right    with knee cap adjustment   PH IMPEDANCE STUDY N/A 01/02/2023   Procedure: PH IMPEDANCE STUDY;  Surgeon: Sherrilyn Rist, MD;  Location: [facility];  Service: Gastroenterology;  Laterality: N/A;   UPPER GASTROINTESTINAL ENDOSCOPY  06/29/2020   Social History   Social History Narrative   Not on file   Immunization History  Administered Date(s) Administered   Influenza Split 09/01/2014,  09/26/2016, 10/03/2018   Influenza, Quadrivalent, Recombinant, Inj, Pf 09/21/2020, 09/12/2021   Influenza,inj,Quad PF,6+ Mos 10/03/2018   Influenza-Unspecified 09/12/2021   PFIZER(Purple Top)SARS-COV-2 Vaccination 03/11/2020, 04/01/2020, 12/08/2020   PNEUMOCOCCAL CONJUGATE-20 05/04/2022   Zoster, Live 05/04/2022     Objective: Vital Signs: There were no vitals taken for this visit.   Physical Exam Vitals and nursing note reviewed.  Constitutional:      Appearance: She is well-developed.  HENT:     Head: Normocephalic and atraumatic.  Eyes:     Conjunctiva/sclera: Conjunctivae normal.  Cardiovascular:     Rate and Rhythm: Normal rate and regular rhythm.     Heart sounds: Normal heart sounds.  Pulmonary:     Effort: Pulmonary effort is normal.     Breath sounds: Normal breath sounds.  Abdominal:     General: Bowel sounds are normal.     Palpations: Abdomen is soft.  Musculoskeletal:     Cervical back: Normal range of motion.  Lymphadenopathy:     Cervical: No cervical adenopathy.  Skin:    General: Skin is warm and dry.     Capillary Refill: Capillary refill takes less than 2 seconds.  Neurological:     Mental Status: She is alert and oriented to person, place, and time.  Psychiatric:        Behavior: Behavior normal.      Musculoskeletal Exam: ***  CDAI Exam: CDAI Score: -- Patient Global: --; Provider Global: -- Swollen: --; Tender: -- Joint Exam 10/01/2023   No joint exam has been documented for this visit   There is currently no information documented on the homunculus. Go to the Rheumatology activity and complete the homunculus joint exam.  Investigation: No additional findings.  Imaging: No results found.  Recent Labs: Lab Results  Component Value Date   WBC 9.5 05/26/2023   HGB 13.4 05/26/2023   PLT 340 05/26/2023   NA 138 05/26/2023   K 3.5 05/26/2023   CL 105 05/26/2023   CO2 21 (L) 05/26/2023   GLUCOSE 130 (H) 05/26/2023   BUN 13  05/26/2023   CREATININE 0.90 05/26/2023   BILITOT 0.4 05/26/2023   ALKPHOS 88 05/26/2023   AST 25 05/26/2023   ALT 45 (H) 05/26/2023   PROT 8.4 (H) 05/26/2023   ALBUMIN 4.0 05/26/2023   CALCIUM 8.9 05/26/2023   GFRAA >60 12/28/2019    Speciality Comments: PLQ eye exam: 06/14/2023 normal @ [facility]. Follow up in 1 year.  Procedures:  No procedures performed Allergies: Lisinopril, Methylphenidate, and Prednisone   Assessment / Plan:     Visit Diagnoses: Undifferentiated connective tissue disease (HCC)  High risk medication use  Elevated sed rate  H/O multiple pulmonary nodules  Fibromyalgia  Trochanteric bursitis of both hips  Primary osteoarthritis of both knees  Other fatigue  Primary insomnia  History of diabetes mellitus  History of gastroesophageal reflux (GERD)  Vitamin D deficiency  History of anemia  Family  history of morphea-Sister  Essential hypertension  Orders: No orders of the defined types were placed in this encounter.  No orders of the defined types were placed in this encounter.   Face-to-face time spent with patient was *** minutes. Greater than 50% of time was spent in counseling and coordination of care.  Follow-Up Instructions: No follow-ups on file.   Gearldine Bienenstock, PA-C  Note - This record has been created using Dragon software.  Chart creation errors have been sought, but may not always  have been located. Such creation errors do not reflect on  the standard of medical care.

## 2023-09-18 LAB — HEMOGLOBIN AND HEMATOCRIT, BLOOD
Hematocrit: 41.3 % (ref 34.0–46.6)
Hemoglobin: 13.1 g/dL (ref 11.1–15.9)

## 2023-09-18 LAB — TSH: TSH: 3.09 u[IU]/mL (ref 0.450–4.500)

## 2023-09-23 DIAGNOSIS — Z1231 Encounter for screening mammogram for malignant neoplasm of breast: Secondary | ICD-10-CM | POA: Diagnosis not present

## 2023-09-23 DIAGNOSIS — R002 Palpitations: Secondary | ICD-10-CM

## 2023-09-24 ENCOUNTER — Other Ambulatory Visit: Payer: Self-pay | Admitting: Surgical

## 2023-09-26 NOTE — Progress Notes (Deleted)
Office Visit Note  Patient: [name]             Date of Birth: 09-29-1971           MRN: [id number]             PCP: Deatra James, MD Referring: Deatra James, MD Visit Date: 10/08/2023 Occupation: @GUAROCC @  Subjective:  No chief complaint on file.   History of Present Illness: [name] is a 52 y.o. female ***     Activities of Daily Living:  Patient reports morning stiffness for *** {minute/hour:19697}.   Patient {ACTIONS;DENIES/REPORTS:21021675::"Denies"} nocturnal pain.  Difficulty dressing/grooming: {ACTIONS;DENIES/REPORTS:21021675::"Denies"} Difficulty climbing stairs: {ACTIONS;DENIES/REPORTS:21021675::"Denies"} Difficulty getting out of chair: {ACTIONS;DENIES/REPORTS:21021675::"Denies"} Difficulty using hands for taps, buttons, cutlery, and/or writing: {ACTIONS;DENIES/REPORTS:21021675::"Denies"}  No Rheumatology ROS completed.   PMFS History:  Patient Active Problem List   Diagnosis Date Noted   Regurgitation of food 01/23/2023   Fibromyalgia 12/25/2016   Other fatigue 12/25/2016   Primary insomnia 12/25/2016   Primary osteoarthritis of both knees 12/25/2016   ANA positive 12/25/2016   History of gastroesophageal reflux (GERD) 12/25/2016   History of anemia 12/25/2016   Trochanteric bursitis of both hips 12/25/2016   Anemia, iron deficiency 04/18/2015   Dysphagia, pharyngoesophageal phase 11/14/2011   Esophageal reflux 11/14/2011    Past Medical History:  Diagnosis Date   ADD (attention deficit disorder with hyperactivity)    Allergic rhinitis    Anxiety    Arthritis    Depression    Diabetes mellitus without complication (HCC)    Dysmenorrhea    Fibromyalgia    GERD (gastroesophageal reflux disease)    H. pylori infection    per patient    HLD (hyperlipidemia)    HTN (hypertension)    Insomnia    Irritable bowel syndrome    Restless leg syndrome    Sleep apnea     Family History  Problem Relation Age of Onset    Breast cancer Maternal Grandmother    Diabetes Sister    Irritable bowel syndrome Sister    Heart disease Brother    Heart attack Brother    Stroke Mother    Heart disease Mother    Diabetes Sister    Irritable bowel syndrome Sister    Fibromyalgia Daughter    Colon cancer Neg Hx    Colon polyps Neg Hx    Esophageal cancer Neg Hx    Kidney disease Neg Hx    Gallbladder disease Neg Hx    Stomach cancer Neg Hx    Past Surgical History:  Procedure Laterality Date   BUNIONECTOMY Left 2008   DILATION AND CURETTAGE OF UTERUS  2004   EAR CYST EXCISION Left 1983   ESOPHAGEAL MANOMETRY N/A 01/02/2023   Procedure: ESOPHAGEAL MANOMETRY (EM);  Surgeon: Sherrilyn Rist, MD;  Location: [facility];  Service: Gastroenterology;  Laterality: N/A;   KNEE ARTHROSCOPY Right    with knee cap adjustment   PH IMPEDANCE STUDY N/A 01/02/2023   Procedure: PH IMPEDANCE STUDY;  Surgeon: Sherrilyn Rist, MD;  Location: [facility];  Service: Gastroenterology;  Laterality: N/A;   UPPER GASTROINTESTINAL ENDOSCOPY  06/29/2020   Social History   Social History Narrative   Not on file   Immunization History  Administered Date(s) Administered   Influenza Split 09/01/2014, 09/26/2016, 10/03/2018   Influenza, Quadrivalent, Recombinant, Inj, Pf 09/21/2020, 09/12/2021   Influenza,inj,Quad PF,6+ Mos 10/03/2018   Influenza-Unspecified 09/12/2021   PFIZER(Purple Top)SARS-COV-2 Vaccination 03/11/2020, 04/01/2020, 12/08/2020  PNEUMOCOCCAL CONJUGATE-20 05/04/2022   Zoster, Live 05/04/2022     Objective: Vital Signs: There were no vitals taken for this visit.   Physical Exam   Musculoskeletal Exam: ***  CDAI Exam: CDAI Score: -- Patient Global: --; Provider Global: -- Swollen: --; Tender: -- Joint Exam 10/08/2023   No joint exam has been documented for this visit   There is currently no information documented on the homunculus. Go to the Rheumatology activity and complete the homunculus joint  exam.  Investigation: No additional findings.  Imaging: No results found.  Recent Labs: Lab Results  Component Value Date   WBC 9.5 05/26/2023   HGB 13.1 09/17/2023   PLT 340 05/26/2023   NA 138 05/26/2023   K 3.5 05/26/2023   CL 105 05/26/2023   CO2 21 (L) 05/26/2023   GLUCOSE 130 (H) 05/26/2023   BUN 13 05/26/2023   CREATININE 0.90 05/26/2023   BILITOT 0.4 05/26/2023   ALKPHOS 88 05/26/2023   AST 25 05/26/2023   ALT 45 (H) 05/26/2023   PROT 8.4 (H) 05/26/2023   ALBUMIN 4.0 05/26/2023   CALCIUM 8.9 05/26/2023   GFRAA >60 12/28/2019    Speciality Comments: PLQ eye exam: 06/14/2023 normal @ [facility]. Follow up in 1 year.  Procedures:  No procedures performed Allergies: Lisinopril, Methylphenidate, and Prednisone   Assessment / Plan:     Visit Diagnoses: No diagnosis found.  Orders: No orders of the defined types were placed in this encounter.  No orders of the defined types were placed in this encounter.   Face-to-face time spent with patient was *** minutes. Greater than 50% of time was spent in counseling and coordination of care.  Follow-Up Instructions: No follow-ups on file.   Ellen Henri, CMA  Note - This record has been created using Animal nutritionist.  Chart creation errors have been sought, but may not always  have been located. Such creation errors do not reflect on  the standard of medical care.

## 2023-09-27 DIAGNOSIS — F330 Major depressive disorder, recurrent, mild: Secondary | ICD-10-CM | POA: Diagnosis not present

## 2023-09-27 DIAGNOSIS — F902 Attention-deficit hyperactivity disorder, combined type: Secondary | ICD-10-CM | POA: Diagnosis not present

## 2023-09-27 DIAGNOSIS — Z1159 Encounter for screening for other viral diseases: Secondary | ICD-10-CM | POA: Diagnosis not present

## 2023-09-27 DIAGNOSIS — E7800 Pure hypercholesterolemia, unspecified: Secondary | ICD-10-CM | POA: Diagnosis not present

## 2023-09-27 DIAGNOSIS — Z0000 Encounter for general adult medical examination without abnormal findings: Secondary | ICD-10-CM | POA: Diagnosis not present

## 2023-09-27 DIAGNOSIS — G4700 Insomnia, unspecified: Secondary | ICD-10-CM | POA: Diagnosis not present

## 2023-09-27 DIAGNOSIS — Z23 Encounter for immunization: Secondary | ICD-10-CM | POA: Diagnosis not present

## 2023-09-27 DIAGNOSIS — E559 Vitamin D deficiency, unspecified: Secondary | ICD-10-CM | POA: Diagnosis not present

## 2023-09-27 DIAGNOSIS — I10 Essential (primary) hypertension: Secondary | ICD-10-CM | POA: Diagnosis not present

## 2023-09-27 DIAGNOSIS — E1140 Type 2 diabetes mellitus with diabetic neuropathy, unspecified: Secondary | ICD-10-CM | POA: Diagnosis not present

## 2023-10-01 ENCOUNTER — Ambulatory Visit: Payer: Medicare HMO | Admitting: Physician Assistant

## 2023-10-01 DIAGNOSIS — Z87898 Personal history of other specified conditions: Secondary | ICD-10-CM

## 2023-10-01 DIAGNOSIS — Z8639 Personal history of other endocrine, nutritional and metabolic disease: Secondary | ICD-10-CM

## 2023-10-01 DIAGNOSIS — Z862 Personal history of diseases of the blood and blood-forming organs and certain disorders involving the immune mechanism: Secondary | ICD-10-CM

## 2023-10-01 DIAGNOSIS — M797 Fibromyalgia: Secondary | ICD-10-CM

## 2023-10-01 DIAGNOSIS — Z8719 Personal history of other diseases of the digestive system: Secondary | ICD-10-CM

## 2023-10-01 DIAGNOSIS — E559 Vitamin D deficiency, unspecified: Secondary | ICD-10-CM

## 2023-10-01 DIAGNOSIS — Z79899 Other long term (current) drug therapy: Secondary | ICD-10-CM

## 2023-10-01 DIAGNOSIS — M7061 Trochanteric bursitis, right hip: Secondary | ICD-10-CM

## 2023-10-01 DIAGNOSIS — F5101 Primary insomnia: Secondary | ICD-10-CM

## 2023-10-01 DIAGNOSIS — R700 Elevated erythrocyte sedimentation rate: Secondary | ICD-10-CM

## 2023-10-01 DIAGNOSIS — M359 Systemic involvement of connective tissue, unspecified: Secondary | ICD-10-CM

## 2023-10-01 DIAGNOSIS — I10 Essential (primary) hypertension: Secondary | ICD-10-CM

## 2023-10-01 DIAGNOSIS — Z840 Family history of diseases of the skin and subcutaneous tissue: Secondary | ICD-10-CM

## 2023-10-01 DIAGNOSIS — M170 Bilateral primary osteoarthritis of knee: Secondary | ICD-10-CM

## 2023-10-01 DIAGNOSIS — R5383 Other fatigue: Secondary | ICD-10-CM

## 2023-10-02 ENCOUNTER — Other Ambulatory Visit: Payer: Self-pay | Admitting: Surgical

## 2023-10-03 ENCOUNTER — Ambulatory Visit (HOSPITAL_BASED_OUTPATIENT_CLINIC_OR_DEPARTMENT_OTHER)
Admission: RE | Admit: 2023-10-03 | Discharge: 2023-10-03 | Disposition: A | Discharge status: Home / Self Care | Payer: Medicare HMO | Source: Ambulatory Visit | Attending: Cardiology | Admitting: Cardiology

## 2023-10-03 DIAGNOSIS — E119 Type 2 diabetes mellitus without complications: Secondary | ICD-10-CM | POA: Insufficient documentation

## 2023-10-03 DIAGNOSIS — Z8249 Family history of ischemic heart disease and other diseases of the circulatory system: Secondary | ICD-10-CM | POA: Insufficient documentation

## 2023-10-08 ENCOUNTER — Ambulatory Visit: Payer: Medicare HMO | Admitting: Rheumatology

## 2023-10-08 DIAGNOSIS — M797 Fibromyalgia: Secondary | ICD-10-CM

## 2023-10-08 DIAGNOSIS — M359 Systemic involvement of connective tissue, unspecified: Secondary | ICD-10-CM

## 2023-10-08 DIAGNOSIS — M7061 Trochanteric bursitis, right hip: Secondary | ICD-10-CM

## 2023-10-08 DIAGNOSIS — Z79899 Other long term (current) drug therapy: Secondary | ICD-10-CM

## 2023-10-08 DIAGNOSIS — Z8719 Personal history of other diseases of the digestive system: Secondary | ICD-10-CM

## 2023-10-08 DIAGNOSIS — M170 Bilateral primary osteoarthritis of knee: Secondary | ICD-10-CM

## 2023-10-08 DIAGNOSIS — E559 Vitamin D deficiency, unspecified: Secondary | ICD-10-CM

## 2023-10-08 DIAGNOSIS — Z862 Personal history of diseases of the blood and blood-forming organs and certain disorders involving the immune mechanism: Secondary | ICD-10-CM

## 2023-10-08 DIAGNOSIS — R700 Elevated erythrocyte sedimentation rate: Secondary | ICD-10-CM

## 2023-10-08 DIAGNOSIS — F5101 Primary insomnia: Secondary | ICD-10-CM

## 2023-10-08 DIAGNOSIS — R5383 Other fatigue: Secondary | ICD-10-CM

## 2023-10-08 DIAGNOSIS — Z840 Family history of diseases of the skin and subcutaneous tissue: Secondary | ICD-10-CM

## 2023-10-08 DIAGNOSIS — Z8639 Personal history of other endocrine, nutritional and metabolic disease: Secondary | ICD-10-CM

## 2023-10-08 DIAGNOSIS — I10 Essential (primary) hypertension: Secondary | ICD-10-CM

## 2023-10-08 DIAGNOSIS — Z87898 Personal history of other specified conditions: Secondary | ICD-10-CM

## 2023-10-08 DIAGNOSIS — R002 Palpitations: Secondary | ICD-10-CM | POA: Diagnosis not present

## 2023-10-09 ENCOUNTER — Ambulatory Visit (HOSPITAL_COMMUNITY): Payer: Medicare HMO | Attending: Cardiology

## 2023-10-09 ENCOUNTER — Other Ambulatory Visit: Payer: Self-pay | Admitting: Surgical

## 2023-10-09 DIAGNOSIS — R6889 Other general symptoms and signs: Secondary | ICD-10-CM | POA: Diagnosis not present

## 2023-10-09 DIAGNOSIS — R0609 Other forms of dyspnea: Secondary | ICD-10-CM | POA: Diagnosis not present

## 2023-10-09 DIAGNOSIS — I10 Essential (primary) hypertension: Secondary | ICD-10-CM | POA: Diagnosis not present

## 2023-10-09 LAB — ECHOCARDIOGRAM COMPLETE
Area-P 1/2: 4.13 cm2
S' Lateral: 2.7 cm

## 2023-10-19 ENCOUNTER — Other Ambulatory Visit: Payer: Self-pay | Admitting: Surgical

## 2023-10-24 ENCOUNTER — Other Ambulatory Visit: Payer: Self-pay | Admitting: Gastroenterology

## 2023-11-01 ENCOUNTER — Other Ambulatory Visit: Payer: Self-pay | Admitting: Gastroenterology

## 2023-11-04 ENCOUNTER — Ambulatory Visit: Payer: Medicare HMO | Attending: Cardiology | Admitting: Cardiology

## 2023-11-06 ENCOUNTER — Other Ambulatory Visit: Payer: Self-pay | Admitting: Physician Assistant

## 2023-11-06 NOTE — Telephone Encounter (Signed)
Please schedule patient a follow up visit. Patient due October 2024. Thanks!   Follow-Up Instructions: Return in about 3 months (around 10/01/2023) for UCTD.

## 2023-11-06 NOTE — Telephone Encounter (Signed)
Last Fill: 09/02/2023  Next Visit: Due October 2024. Message sent to the front to schedule.   Last Visit: 07/01/2023  Dx: Primary osteoarthritis of both knees   Current Dose per office note on 07/01/2023: not discussed  Okay to refill Voltaren Gel?

## 2023-11-06 NOTE — Telephone Encounter (Signed)
LMOM for patient to call and schedule 3 month follow-up appointment. °

## 2023-11-22 NOTE — Progress Notes (Signed)
Office Visit Note  Patient: [name]             Date of Birth: 1971/10/28           MRN: [id number]             PCP: Deatra James, MD Referring: Deatra James, MD Visit Date: 12/06/2023 Occupation: @GUAROCC @  Subjective:  Pain in both knees  History of Present Illness: [name] is a 52 y.o. female with undifferentiated connective tissue disease.  She states for the last few days she has been having pain and discomfort in her both knees.  She feels there is a knot behind her left knee.  She continues to have some discomfort in her shoulders.  She states that she was having some abdominal discomfort and she reduced the dose of Plaquenil to once a day since October.    Activities of Daily Living:  Patient reports morning stiffness for 30  minutes.   Patient Reports nocturnal pain.  Difficulty dressing/grooming: Denies Difficulty climbing stairs: Reports Difficulty getting out of chair: Reports Difficulty using hands for taps, buttons, cutlery, and/or writing: Denies  Review of Systems  Constitutional:  Positive for fatigue.  HENT:  Negative for mouth sores and mouth dryness.   Eyes:  Negative for redness.  Respiratory:  Positive for shortness of breath.   Cardiovascular:  Positive for palpitations. Negative for hypertension.  Gastrointestinal:  Negative for blood in stool, constipation and diarrhea.  Endocrine: Negative for increased urination.  Genitourinary:  Negative for difficulty urinating.  Musculoskeletal:  Positive for joint pain, gait problem and joint pain. Negative for joint swelling, myalgias, muscle tenderness and myalgias.  Skin:  Negative for color change, rash and sensitivity to sunlight.  Allergic/Immunologic: Negative for susceptible to infections.  Neurological:  Negative for dizziness.  Hematological:  Negative for swollen glands.  Psychiatric/Behavioral:  Positive for depressed mood and sleep disturbance. The patient is  nervous/anxious.     PMFS History:  Patient Active Problem List   Diagnosis Date Noted   Regurgitation of food 01/23/2023   Fibromyalgia 12/25/2016   Other fatigue 12/25/2016   Primary insomnia 12/25/2016   Primary osteoarthritis of both knees 12/25/2016   ANA positive 12/25/2016   History of gastroesophageal reflux (GERD) 12/25/2016   History of anemia 12/25/2016   Trochanteric bursitis of both hips 12/25/2016   Anemia, iron deficiency 04/18/2015   Dysphagia, pharyngoesophageal phase 11/14/2011   Esophageal reflux 11/14/2011    Past Medical History:  Diagnosis Date   ADD (attention deficit disorder with hyperactivity)    Allergic rhinitis    Anxiety    Arthritis    Depression    Diabetes mellitus without complication (HCC)    Dysmenorrhea    Fibromyalgia    GERD (gastroesophageal reflux disease)    H. pylori infection    per patient    HLD (hyperlipidemia)    HTN (hypertension)    Insomnia    Irritable bowel syndrome    Restless leg syndrome    Sleep apnea     Family History  Problem Relation Age of Onset   Breast cancer Maternal Grandmother    Diabetes Sister    Irritable bowel syndrome Sister    Heart disease Brother    Heart attack Brother    Stroke Mother    Heart disease Mother    Diabetes Sister    Irritable bowel syndrome Sister    Fibromyalgia Daughter    Colon cancer Neg Hx  Colon polyps Neg Hx    Esophageal cancer Neg Hx    Kidney disease Neg Hx    Gallbladder disease Neg Hx    Stomach cancer Neg Hx    Past Surgical History:  Procedure Laterality Date   BUNIONECTOMY Left 2008   DILATION AND CURETTAGE OF UTERUS  2004   EAR CYST EXCISION Left 1983   ESOPHAGEAL MANOMETRY N/A 01/02/2023   Procedure: ESOPHAGEAL MANOMETRY (EM);  Surgeon: Sherrilyn Rist, MD;  Location: [facility];  Service: Gastroenterology;  Laterality: N/A;   KNEE ARTHROSCOPY Right    with knee cap adjustment   PH IMPEDANCE STUDY N/A 01/02/2023   Procedure: PH IMPEDANCE  STUDY;  Surgeon: Sherrilyn Rist, MD;  Location: [facility];  Service: Gastroenterology;  Laterality: N/A;   UPPER GASTROINTESTINAL ENDOSCOPY  06/29/2020   Social History   Social History Narrative   Not on file   Immunization History  Administered Date(s) Administered   Influenza Split 09/01/2014, 09/26/2016, 10/03/2018   Influenza, Quadrivalent, Recombinant, Inj, Pf 09/21/2020, 09/12/2021   Influenza,inj,Quad PF,6+ Mos 10/03/2018   Influenza-Unspecified 09/12/2021   PFIZER(Purple Top)SARS-COV-2 Vaccination 03/11/2020, 04/01/2020, 12/08/2020   PNEUMOCOCCAL CONJUGATE-20 05/04/2022   Zoster, Live 05/04/2022     Objective: Vital Signs: BP (!) 166/113 (BP Location: Left Arm, Patient Position: Sitting, Cuff Size: Large)   Pulse (!) 103   Resp 14   Ht 5\' 6"  (1.676 m)   Wt 229 lb (103.9 kg)   BMI 36.96 kg/m    Physical Exam Vitals and nursing note reviewed.  Constitutional:      Appearance: She is well-developed.  HENT:     Head: Normocephalic and atraumatic.  Eyes:     Conjunctiva/sclera: Conjunctivae normal.  Cardiovascular:     Rate and Rhythm: Normal rate and regular rhythm.     Heart sounds: Normal heart sounds.  Pulmonary:     Effort: Pulmonary effort is normal.     Breath sounds: Normal breath sounds.  Abdominal:     General: Bowel sounds are normal.     Palpations: Abdomen is soft.  Musculoskeletal:     Cervical back: Normal range of motion.  Lymphadenopathy:     Cervical: No cervical adenopathy.  Skin:    General: Skin is warm and dry.     Capillary Refill: Capillary refill takes less than 2 seconds.  Neurological:     Mental Status: She is alert and oriented to person, place, and time.  Psychiatric:        Behavior: Behavior normal.      Musculoskeletal Exam: Cervical spine was in good range of motion.  She had no tenderness over thoracic or lumbar spine.  Shoulders, elbows, wrist, MCPs PIPs and DIPs Juengel range of motion with no synovitis.  Hip  joints and knee joints with good range of motion.  No warmth swelling or effusion was noted.  She had discomfort range of motion of her left knee joint.  She describes discomfort in the popliteal region.  There was no tenderness over ankles or MTPs.  She had generalized hyperalgesia and positive tender points.  CDAI Exam: CDAI Score: -- Patient Global: --; Provider Global: -- Swollen: --; Tender: -- Joint Exam 12/06/2023   No joint exam has been documented for this visit   There is currently no information documented on the homunculus. Go to the Rheumatology activity and complete the homunculus joint exam.  Investigation: No additional findings.  Imaging: XR KNEE 3 VIEW LEFT Result Date: 12/06/2023 No  medial or lateral compartment narrowing was noted.  No patellofemoral narrowing was noted.  No radiographic progression was noted when compared to the x-rays of 2023. Impression: Unremarkable x-rays of the knee.   Recent Labs: Lab Results  Component Value Date   WBC 9.5 05/26/2023   HGB 13.1 09/17/2023   PLT 340 05/26/2023   NA 138 05/26/2023   K 3.5 05/26/2023   CL 105 05/26/2023   CO2 21 (L) 05/26/2023   GLUCOSE 130 (H) 05/26/2023   BUN 13 05/26/2023   CREATININE 0.90 05/26/2023   BILITOT 0.4 05/26/2023   ALKPHOS 88 05/26/2023   AST 25 05/26/2023   ALT 45 (H) 05/26/2023   PROT 8.4 (H) 05/26/2023   ALBUMIN 4.0 05/26/2023   CALCIUM 8.9 05/26/2023   GFRAA >60 12/28/2019    Speciality Comments: PLQ eye exam: 06/14/2023 normal @ [facility]. Follow up in 1 year.  Procedures:  No procedures performed Allergies: Lisinopril, Methylphenidate, and Prednisone   Assessment / Plan:     Visit Diagnoses: Undifferentiated connective tissue disease (HCC) - 04/24/2023 : ANA 1: 80 NH, 1:1280 [place], centromere antibody positive, RNP positive-1.3, CRP WNL, Smith antibody neg, Ro and [place] antibodies neg, RF neg, anti-CCP neg -patient denies any history of oral ulcers, nasal ulcers, malar  rash, photosensitivity, Raynaud's or lymphadenopathy.  Labs obtained on Apr 24, 2023 double-stranded DNA was normal complements were normal, sed rate was normal.  She has been on hydroxychloroquine 200 mg p.o. twice daily since last visit.  Patient states she reduce the dose to once a day as was causing GI side effects.  She has not noticed much difference on hydroxychloroquine.  She has been experiencing increased pain and discomfort in the left knee joint for the last few weeks.  She has chronic discomfort in her knee joints.  Plan: Protein / creatinine ratio, urine, ANA, Anti-DNA antibody, double-stranded, C3 and C4, Sedimentation rate  High risk medication use - Plaquenil: 200 mg 1 tablet by mouth  daily Monday through Friday. PLQ eye exam: 06/14/2023 - Plan: CBC with Differential/Platelet, COMPLETE METABOLIC PANEL WITH GFR  Chronic pain of left knee -no warmth swelling or effusion was noted.  She had some discomfort range of motion of her left knee.  Plan: XR KNEE 3 VIEW LEFT.  X-rays obtained of the left knee were unremarkable.  No radiographic progression was noted when compared to the x-rays of 2023.  Patient requested injection in the left knee joint.  Her blood pressure remained elevated and we could not inject her knee.  Use of topical Voltaren gel was advised.  Primary osteoarthritis of both knees-she has known history of osteoarthritis in her knees.  Trochanteric bursitis of both hips-she can history of some discomfort in the trochanteric region.  H/O multiple pulmonary nodules - Dr. Francine Graven. HRCT 05/17/2021 subpleural nodule left upper lobe.Chest CT 06/27/2022:New 5 mm part-solid pulmonary nodule LUL  Fibromyalgia-she continues to have generalized pain and discomfort.  She has positive tender points and fatigue.  Other fatigue-she gives a tree of fatigue.  Primary insomnia-she gives history of chronic insomnia.  Good sleep hygiene was discussed.  History of diabetes mellitus-she is advised  to monitor blood glucose levels closely.  Vitamin D deficiency  Essential hypertension-her blood pressure was elevated at 173/113.  Repeat blood pressure was 166/113.  Patient was advised to contact her PCP for the management of blood pressure.  History of gastroesophageal reflux (GERD)  History of anemia  Family history of morphea-Sister  Orders:  Orders Placed This Encounter  Procedures   XR KNEE 3 VIEW LEFT   Protein / creatinine ratio, urine   CBC with Differential/Platelet   COMPLETE METABOLIC PANEL WITH GFR   ANA   Anti-DNA antibody, double-stranded   C3 and C4   Sedimentation rate   No orders of the defined types were placed in this encounter.  .  Follow-Up Instructions: Return in about 3 months (around 03/05/2024) for UCTD, FMS, OA.   Pollyann Savoy, MD  Note - This record has been created using Animal nutritionist.  Chart creation errors have been sought, but may not always  have been located. Such creation errors do not reflect on  the standard of medical care.

## 2023-12-06 ENCOUNTER — Encounter: Payer: Self-pay | Admitting: Rheumatology

## 2023-12-06 ENCOUNTER — Ambulatory Visit (INDEPENDENT_AMBULATORY_CARE_PROVIDER_SITE_OTHER): Payer: Medicare HMO

## 2023-12-06 ENCOUNTER — Ambulatory Visit: Payer: Medicare HMO | Attending: Rheumatology | Admitting: Rheumatology

## 2023-12-06 VITALS — BP 180/110 | HR 90 | Resp 14 | Ht 66.0 in | Wt 229.0 lb

## 2023-12-06 DIAGNOSIS — Z8719 Personal history of other diseases of the digestive system: Secondary | ICD-10-CM

## 2023-12-06 DIAGNOSIS — I10 Essential (primary) hypertension: Secondary | ICD-10-CM

## 2023-12-06 DIAGNOSIS — M797 Fibromyalgia: Secondary | ICD-10-CM | POA: Diagnosis not present

## 2023-12-06 DIAGNOSIS — G8929 Other chronic pain: Secondary | ICD-10-CM

## 2023-12-06 DIAGNOSIS — R5383 Other fatigue: Secondary | ICD-10-CM | POA: Diagnosis not present

## 2023-12-06 DIAGNOSIS — Z87898 Personal history of other specified conditions: Secondary | ICD-10-CM

## 2023-12-06 DIAGNOSIS — M7062 Trochanteric bursitis, left hip: Secondary | ICD-10-CM

## 2023-12-06 DIAGNOSIS — E559 Vitamin D deficiency, unspecified: Secondary | ICD-10-CM

## 2023-12-06 DIAGNOSIS — R6889 Other general symptoms and signs: Secondary | ICD-10-CM | POA: Diagnosis not present

## 2023-12-06 DIAGNOSIS — M359 Systemic involvement of connective tissue, unspecified: Secondary | ICD-10-CM | POA: Diagnosis not present

## 2023-12-06 DIAGNOSIS — Z840 Family history of diseases of the skin and subcutaneous tissue: Secondary | ICD-10-CM

## 2023-12-06 DIAGNOSIS — M7061 Trochanteric bursitis, right hip: Secondary | ICD-10-CM

## 2023-12-06 DIAGNOSIS — R700 Elevated erythrocyte sedimentation rate: Secondary | ICD-10-CM

## 2023-12-06 DIAGNOSIS — M25562 Pain in left knee: Secondary | ICD-10-CM | POA: Diagnosis not present

## 2023-12-06 DIAGNOSIS — Z862 Personal history of diseases of the blood and blood-forming organs and certain disorders involving the immune mechanism: Secondary | ICD-10-CM

## 2023-12-06 DIAGNOSIS — Z79899 Other long term (current) drug therapy: Secondary | ICD-10-CM

## 2023-12-06 DIAGNOSIS — Z8639 Personal history of other endocrine, nutritional and metabolic disease: Secondary | ICD-10-CM

## 2023-12-06 DIAGNOSIS — M170 Bilateral primary osteoarthritis of knee: Secondary | ICD-10-CM | POA: Diagnosis not present

## 2023-12-06 DIAGNOSIS — F5101 Primary insomnia: Secondary | ICD-10-CM | POA: Diagnosis not present

## 2023-12-06 NOTE — Patient Instructions (Signed)

## 2023-12-08 LAB — CBC WITH DIFFERENTIAL/PLATELET
Absolute Lymphocytes: 2314 {cells}/uL (ref 850–3900)
Absolute Monocytes: 399 {cells}/uL (ref 200–950)
Basophils Absolute: 29 {cells}/uL (ref 0–200)
Basophils Relative: 0.5 %
Eosinophils Absolute: 11 {cells}/uL — ABNORMAL LOW (ref 15–500)
Eosinophils Relative: 0.2 %
HCT: 43 % (ref 35.0–45.0)
Hemoglobin: 13.4 g/dL (ref 11.7–15.5)
MCH: 24.7 pg — ABNORMAL LOW (ref 27.0–33.0)
MCHC: 31.2 g/dL — ABNORMAL LOW (ref 32.0–36.0)
MCV: 79.2 fL — ABNORMAL LOW (ref 80.0–100.0)
MPV: 9.2 fL (ref 7.5–12.5)
Monocytes Relative: 7 %
Neutro Abs: 2947 {cells}/uL (ref 1500–7800)
Neutrophils Relative %: 51.7 %
Platelets: 322 10*3/uL (ref 140–400)
RBC: 5.43 10*6/uL — ABNORMAL HIGH (ref 3.80–5.10)
RDW: 15.4 % — ABNORMAL HIGH (ref 11.0–15.0)
Total Lymphocyte: 40.6 %
WBC: 5.7 10*3/uL (ref 3.8–10.8)

## 2023-12-08 LAB — COMPLETE METABOLIC PANEL WITH GFR
AG Ratio: 1.3 (calc) (ref 1.0–2.5)
ALT: 20 U/L (ref 6–29)
AST: 13 U/L (ref 10–35)
Albumin: 4.4 g/dL (ref 3.6–5.1)
Alkaline phosphatase (APISO): 109 U/L (ref 37–153)
BUN: 10 mg/dL (ref 7–25)
CO2: 29 mmol/L (ref 20–32)
Calcium: 9.5 mg/dL (ref 8.6–10.4)
Chloride: 101 mmol/L (ref 98–110)
Creat: 0.93 mg/dL (ref 0.50–1.03)
Globulin: 3.4 g/dL (ref 1.9–3.7)
Glucose, Bld: 150 mg/dL — ABNORMAL HIGH (ref 65–99)
Potassium: 4.2 mmol/L (ref 3.5–5.3)
Sodium: 141 mmol/L (ref 135–146)
Total Bilirubin: 0.4 mg/dL (ref 0.2–1.2)
Total Protein: 7.8 g/dL (ref 6.1–8.1)
eGFR: 73 mL/min/{1.73_m2} (ref >=60)

## 2023-12-08 LAB — ANTI-NUCLEAR AB-TITER (ANA TITER)
ANA TITER: 1:1280 {titer} — ABNORMAL HIGH
ANA Titer 1: 1:320 {titer} — ABNORMAL HIGH

## 2023-12-08 LAB — C3 AND C4
C3 Complement: 216 mg/dL — ABNORMAL HIGH (ref 83–193)
C4 Complement: 30 mg/dL (ref 15–57)

## 2023-12-08 LAB — PROTEIN / CREATININE RATIO, URINE
Creatinine, Urine: 223 mg/dL (ref 20–275)
Protein/Creat Ratio: 108 mg/g{creat} (ref 24–184)
Protein/Creatinine Ratio: 0.108 mg/mg{creat} (ref 0.024–0.184)
Total Protein, Urine: 24 mg/dL (ref 5–24)

## 2023-12-08 LAB — SEDIMENTATION RATE: Sed Rate: 17 mm/h (ref 0–30)

## 2023-12-08 LAB — ANA: Anti Nuclear Antibody (ANA): POSITIVE — AB

## 2023-12-08 LAB — ANTI-DNA ANTIBODY, DOUBLE-STRANDED: ds DNA Ab: <1 [IU]/mL

## 2023-12-08 NOTE — Progress Notes (Signed)
MCV is low.  Patient should take multivitamin with iron.  Glucose is elevated at 150.  Please notify patient and forward results to PCP.  ANA is positive and stable titer, C3-C4 normal, urine protein creatinine ratio negative, sed rate normal, double-stranded DNA negative.  Labs do not indicate an autoimmune disease flare.

## 2023-12-20 ENCOUNTER — Telehealth: Payer: Self-pay | Admitting: Pulmonary Disease

## 2023-12-20 NOTE — Telephone Encounter (Signed)
 Patient is due for follow up visit from last May, please schedule this month or next month. May use blocked visit time if needed.  Thanks, JD

## 2023-12-24 ENCOUNTER — Ambulatory Visit: Payer: Medicare HMO | Admitting: Pulmonary Disease

## 2023-12-24 NOTE — Progress Notes (Deleted)
 Synopsis: Referred in May 2022 for chronic cough by Victory Legrand MOULD, MD  Subjective:   PATIENT ID: [name] GENDER: female DOB: [birth year], MRN: [id number]  HPI  No chief complaint on file.  [name] is a 53 year old woman, former smoker with history of GERD, dysphagia, and fibromyalgia returns to pulmonary clinic for chronic cough.    OV 04/17/23 She had positive ANA 1:320 nuclear speckled pattern and 1:1,280 nuclear centromere pattern, centromere ab 7.4, positive ENA RNP 1.8, ESR 66. She was seen by Dr. Dolphus 07/19/22 with out convincing clinical findings for scleroderma involvement.   She is followed by Dr. Legrand of GI. Esophageal manometry and pH monitoring was inconclusive for dysmotility. She continues to feel like food gets stuck in her chest.  PFTs 04/10/23 show mild obstructive airways disease with significant bronchodilator response.   Her mouth is always dry.   OV 07/10/22 She has been doing well since last visit. She continues to have mild intermittent dry cough. She has exertional dyspnea walking around her home. Denies wheezing.   She continues to have diffuse joint pains. She reports her sister has an autoimmune condition that she is on medication and another sister who has fibromyalgia.   We reviewed her CT Chest scan where the subcentimeter pulmonary nodules are stable and a new LUL groundglass nodule measuring 5mm.   OV 09/12/21 She reports her cough is much improved after stopping lisinopril on 06/2021 after seeing Madelin Stank, NP. She continues to have intermittent cough that is sporadic. She has no night time awakenings from the cough. She denies sputum production. She continues to have on-going reflux symptoms despite 40mg  prilosec daily and sleeping on a wedge pillow. Her GERD symptoms are worse in the morning time. She also has intermittent sinus congestion. She is using flonase  daily and ipratropium nasal spray as needed. She uses  albuterol  inhaler as needed for the cough which she reports was mainly from summer allergies.   OV 06/12/21 She reports she is still coughing up blood-tinged sputum.  She is also blowing her nose with bloody secretions.  She has noted that her sinuses have been draining a lot these past few weeks.  She continues to deny any shortness of breath or wheezing with the cough.  HRCT chest on 05/17/2021 shows unremarkable parenchyma and airways.  No nodules or effusions noted.  OV 05/09/21 She reports having cough since last March.  She has been coughing up clear mucus until recently she has noted blood-tinged sputum.  She describes coughing episodes with posttussive emesis.  She has tried using albuterol  and cough syrup without relief of her cough.  She does report shortness of breath and chest tightness with the cough.  She denies wheezing.  She is followed by GI for GERD and ongoing symptoms of nausea and vomiting.  She does report history of dysphagia requiring esophageal dilation.  She had EGD last year with biopsies showing H. pylori positivity in which she has completed treatment.  She is currently taking omeprazole  20 mg twice daily.  She also sleeps with the head of her bed elevated at night.  She denies any epistaxis but she reports a sore in her left nasal passage that can lead to blood-tinged nasal secretions and irritation.   She quit smoking in 1995 and has about a 12 pack year smoking history.  She denies history of blood clots.  Past Medical History:  Diagnosis Date   ADD (attention deficit disorder with hyperactivity)  Allergic rhinitis    Anxiety    Arthritis    Depression    Diabetes mellitus without complication (HCC)    Dysmenorrhea    Fibromyalgia    GERD (gastroesophageal reflux disease)    H. pylori infection    per patient    HLD (hyperlipidemia)    HTN (hypertension)    Insomnia    Irritable bowel syndrome    Restless leg syndrome    Sleep apnea      Family  History  Problem Relation Age of Onset   Breast cancer Maternal Grandmother    Diabetes Sister    Irritable bowel syndrome Sister    Heart disease Brother    Heart attack Brother    Stroke Mother    Heart disease Mother    Diabetes Sister    Irritable bowel syndrome Sister    Fibromyalgia Daughter    Colon cancer Neg Hx    Colon polyps Neg Hx    Esophageal cancer Neg Hx    Kidney disease Neg Hx    Gallbladder disease Neg Hx    Stomach cancer Neg Hx      Social History   Socioeconomic History   Marital status: Widowed    Spouse name: Not on file   Number of children: 1   Years of education: Not on file   Highest education level: Not on file  Occupational History   Occupation: Disabled  Tobacco Use   Smoking status: Former    Current packs/day: 0.00    Average packs/day: 0.1 packs/day for 12.0 years (1.2 ttl pk-yrs)    Types: Cigarettes    Start date: 12/27/1981    Quit date: 12/27/1993    Years since quitting: 30.0    Passive exposure: Never   Smokeless tobacco: Never  Vaping Use   Vaping status: Never Used  Substance and Sexual Activity   Alcohol use: No   Drug use: No   Sexual activity: Yes  Other Topics Concern   Not on file  Social History Narrative   Not on file   Social Drivers of Health   Financial Resource Strain: Not on file  Food Insecurity: Not on file  Transportation Needs: No Transportation Needs (07/10/2022)   PRAPARE - Administrator, Civil Service (Medical): No    Lack of Transportation (Non-Medical): No  Physical Activity: Not on file  Stress: Not on file  Social Connections: Not on file  Intimate Partner Violence: Not on file     Allergies  Allergen Reactions   Lisinopril     Cough    Methylphenidate Other (See Comments)    Other reaction(s): palpitations   Prednisone     Other reaction(s): rapid heartbeat     Outpatient Medications Prior to Visit  Medication Sig Dispense Refill   ACCU-CHEK GUIDE TEST test strip USE  TO CHECK BLOOD SUGAR ALTERNATING MORNINGS AND EVENINGS BEFORE MEALS     albuterol  (VENTOLIN  HFA) 108 (90 Base) MCG/ACT inhaler Inhale 2 puffs into the lungs in the morning, at noon, in the evening, and at bedtime. 8 g 11   amLODipine  (NORVASC ) 10 MG tablet Take 10 mg by mouth daily.     amphetamine -dextroamphetamine  (ADDERALL) 10 MG tablet Take 10 mg by mouth daily with breakfast.     Blood Glucose Monitoring Suppl (ACCU-CHEK GUIDE ME) w/Device KIT See admin instructions.     buPROPion  (WELLBUTRIN  XL) 150 MG 24 hr tablet Take 150 mg by mouth every morning.  diclofenac  Sodium (VOLTAREN ) 1 % GEL APPLY 2-4 GRAMS TO AFFECTED JOINT 4 TIMES DAILY AS NEEDED. 400 g 1   escitalopram  (LEXAPRO ) 20 MG tablet Take 20 mg by mouth at bedtime.      fluticasone  (FLONASE ) 50 MCG/ACT nasal spray Place 1 spray into both nostrils daily. 16 mL 2   fluticasone -salmeterol (ADVAIR HFA) 115-21 MCG/ACT inhaler Inhale 2 puffs into the lungs 2 (two) times daily. 1 each 12   gabapentin (NEURONTIN) 300 MG capsule Take 3 capsules by mouth at bedtime.     hydroxychloroquine  (PLAQUENIL ) 200 MG tablet TAKE 1 TABLET BY MOUTH TWICE DAILY MONDAY THROUGH FRIDAY ONLY (NONE ON SATURDAY OR SUNDAY) 120 tablet 0   ibuprofen  (ADVIL ) 800 MG tablet Take 800 mg by mouth 3 (three) times daily.     ipratropium (ATROVENT ) 0.03 % nasal spray Place 2 sprays into both nostrils every 12 (twelve) hours as needed for rhinitis.     LINZESS  72 MCG capsule Take 72 mcg by mouth every other day.     losartan  (COZAAR ) 50 MG tablet Take 50 mg by mouth daily.     metoCLOPramide  (REGLAN ) 5 MG tablet TAKE 1 TABLET (5 MG TOTAL) BY MOUTH EVERY 8 (EIGHT) HOURS AS NEEDED FOR NAUSEA. 60 tablet 2   naproxen  (NAPROSYN ) 500 MG tablet Take 500 mg by mouth as needed.     omeprazole  (PRILOSEC) 20 MG capsule TAKE 2 CAPSULES BY MOUTH EVERY DAY 180 capsule 1   OZEMPIC, 0.25 OR 0.5 MG/DOSE, 2 MG/1.5ML SOPN Inject 0.25 mg into the skin at bedtime.  (Patient not taking:  Reported on 12/06/2023)     OZEMPIC, 1 MG/DOSE, 4 MG/3ML SOPN Inject 1 mg into the skin once a week.     Prucalopride Succinate  (MOTEGRITY ) 2 MG TABS Take 1 tablet (2 mg total) by mouth daily at 8 pm. 30 tablet 1   SHINGRIX injection  (Patient not taking: Reported on 12/06/2023)     tiZANidine  (ZANAFLEX ) 4 MG tablet Take 1 tablet (4 mg total) by mouth every 8 (eight) hours as needed for muscle spasms. 90 tablet 2   traMADol  (ULTRAM ) 50 MG tablet Take 50 mg by mouth 2 (two) times daily. prn     valACYclovir (VALTREX) 500 MG tablet Take 500 mg by mouth 2 (two) times daily as needed.     zolpidem  (AMBIEN ) 5 MG tablet TAKE 1 TABLET (5 MG TOTAL) BY MOUTH AT BEDTIME AS NEEDED FOR SLEEP. 30 tablet 0   No facility-administered medications prior to visit.   Review of Systems  Constitutional:  Negative for chills, fever, malaise/fatigue and weight loss.  HENT:  Negative for congestion, nosebleeds, sinus pain and sore throat.   Eyes: Negative.   Respiratory:  Positive for cough. Negative for hemoptysis, sputum production, shortness of breath and wheezing.   Cardiovascular:  Negative for chest pain, palpitations, orthopnea, claudication and leg swelling.  Gastrointestinal:  Positive for heartburn. Negative for abdominal pain, nausea and vomiting.  Genitourinary: Negative.   Musculoskeletal:  Negative for joint pain and myalgias.  Skin:  Negative for rash.  Neurological:  Negative for weakness.  Endo/Heme/Allergies: Negative.   Psychiatric/Behavioral: Negative.     Objective:   There were no vitals filed for this visit.   Physical Exam Constitutional:      General: She is not in acute distress.    Appearance: She is obese. She is not ill-appearing.  HENT:     Head: Normocephalic and atraumatic.  Eyes:     General: No  scleral icterus. Cardiovascular:     Rate and Rhythm: Normal rate and regular rhythm.     Pulses: Normal pulses.     Heart sounds: Normal heart sounds. No murmur  heard. Pulmonary:     Effort: Pulmonary effort is normal.     Breath sounds: Normal breath sounds. No wheezing, rhonchi or rales.  Musculoskeletal:     Right lower leg: No edema.     Left lower leg: No edema.  Skin:    General: Skin is warm and dry.  Neurological:     General: No focal deficit present.     Mental Status: She is alert.     CBC    Component Value Date/Time   WBC 5.7 12/06/2023 1126   RBC 5.43 (H) 12/06/2023 1126   HGB 13.4 12/06/2023 1126   HGB 13.1 09/17/2023 0857   HCT 43.0 12/06/2023 1126   HCT 41.3 09/17/2023 0857   PLT 322 12/06/2023 1126   MCV 79.2 (L) 12/06/2023 1126   MCH 24.7 (L) 12/06/2023 1126   MCHC 31.2 (L) 12/06/2023 1126   RDW 15.4 (H) 12/06/2023 1126   LYMPHSABS 3,086 04/24/2023 1401   MONOABS 0.6 06/29/2020 1155   EOSABS 11 (L) 12/06/2023 1126   BASOSABS 29 12/06/2023 1126      Latest Ref Rng & Units 12/06/2023   11:26 AM 05/26/2023   10:12 AM 04/24/2023    2:01 PM  BMP  Glucose 65 - 99 mg/dL 849  869  883   BUN 7 - 25 mg/dL 10  13  9    Creatinine 0.50 - 1.03 mg/dL 9.06  9.09  9.05   BUN/Creat Ratio 6 - 22 (calc) SEE NOTE:   SEE NOTE:   Sodium 135 - 146 mmol/L 141  138  141   Potassium 3.5 - 5.3 mmol/L 4.2  3.5  4.2   Chloride 98 - 110 mmol/L 101  105  103   CO2 20 - 32 mmol/L 29  21  26    Calcium  8.6 - 10.4 mg/dL 9.5  8.9  9.8    Chest imaging: HRCT Chest 07/24/23 Mediastinum/Nodes: No pathologically enlarged mediastinal or hilar lymph nodes. Please note that accurate exclusion of hilar adenopathy is limited on noncontrast CT scans. Esophagus is unremarkable in appearance. No axillary lymphadenopathy.   Lungs/Pleura: High-resolution images demonstrate no significant regions of ground-glass attenuation, septal thickening, subpleural reticulation, parenchymal banding, traction bronchiectasis or frank honeycombing to indicate interstitial lung disease. Inspiratory and expiratory imaging is unremarkable. No acute consolidative  airspace disease. No pleural effusions. A few scattered tiny 2-3 mm pulmonary nodules are noted in the lungs bilaterally, stable compared to the prior study, considered definitively benign requiring no future imaging follow-up. No other larger more suspicious appearing pulmonary nodules or masses are noted.  CT Chest 06/27/22 1. New 5 mm part-solid pulmonary nodule within the left upper lobe. Per Fleischner Society Guidelines, no routine follow-up imaging is recommended. These guidelines do not apply to immunocompromised patients and patients with cancer. Follow up in patients with significant comorbidities as clinically warranted. For lung cancer screening, adhere to Lung-RADS guidelines. Reference: Radiology. 2017; 284(1):228-43. 2. Additional bilateral pulmonary nodules are stable measuring up to 5 mm. Consider additional follow-up in 12 months.  CT Chest 05/17/21 Mediastinum/Nodes: No enlarged mediastinal, hilar, or axillary lymph nodes. Thyroid  gland, trachea, and esophagus demonstrate no significant findings.   Lungs/Pleura: 4 mm subpleural nodule of the anterior left upper lobe (series 4, image 87). No evidence of fibrotic interstitial lung disease.  No significant air trapping on expiratory phase imaging. No pleural effusion or pneumothorax.  CXR 12/28/2019 The heart size and mediastinal contours are within normal limits. Both lungs are clear. Mild thoracic spondylosis. No blunting of the costophrenic angles.  PFT:    Latest Ref Rng & Units 04/10/2023   11:26 AM  PFT Results  FVC-Pre L 3.18   FVC-Predicted Pre % 85   FVC-Post L 3.30   FVC-Predicted Post % 88   Pre FEV1/FVC % % 64   Post FEV1/FCV % % 70   FEV1-Pre L 2.04   FEV1-Predicted Pre % 69   FEV1-Post L 2.32   DLCO uncorrected ml/min/mmHg 18.06   DLCO UNC% % 81   DLCO corrected ml/min/mmHg 18.06   DLCO COR %Predicted % 81   DLVA Predicted % 103   TLC L 4.86   TLC % Predicted % 90   RV % Predicted % 80       Assessment & Plan:   No diagnosis found.  Discussion: [name] is a 53 year old woman, former smoker with history of GERD, dysphagia, and fibromyalgia who returns to pulmonary clinic for cough and asthma.  PFTs  show mild obstructive airways disease with significant bronchodilator response. We will start her on advair HFA 115-21mcg 2 puffs twice daily and continue as needed albuterol . Will monitor for any further improvement in her cough symptoms.   We will schedule her for HRCT Chest in 2 months to monitor for possible ILD given her history of ANA and Centromere Ab positivity. She has been evaluated by rheumatology with no clear clinical evidence of scleroderma.  She is taking omeprazole  40mg  daily and sleeping elevated on wedge pillow. She continues to have sensation of food sticking in her chest. Will discuss with GI whether barium esophogram is worth while.   Follow up in 4 months.   Dorn Chill, MD [facility] Office: [phone]    Current Outpatient Medications:    ACCU-CHEK GUIDE TEST test strip, USE TO CHECK BLOOD SUGAR ALTERNATING MORNINGS AND EVENINGS BEFORE MEALS, Disp: , Rfl:    albuterol  (VENTOLIN  HFA) 108 (90 Base) MCG/ACT inhaler, Inhale 2 puffs into the lungs in the morning, at noon, in the evening, and at bedtime., Disp: 8 g, Rfl: 11   amLODipine  (NORVASC ) 10 MG tablet, Take 10 mg by mouth daily., Disp: , Rfl:    amphetamine -dextroamphetamine  (ADDERALL) 10 MG tablet, Take 10 mg by mouth daily with breakfast., Disp: , Rfl:    Blood Glucose Monitoring Suppl (ACCU-CHEK GUIDE ME) w/Device KIT, See admin instructions., Disp: , Rfl:    buPROPion  (WELLBUTRIN  XL) 150 MG 24 hr tablet, Take 150 mg by mouth every morning., Disp: , Rfl:    diclofenac  Sodium (VOLTAREN ) 1 % GEL, APPLY 2-4 GRAMS TO AFFECTED JOINT 4 TIMES DAILY AS NEEDED., Disp: 400 g, Rfl: 1   escitalopram  (LEXAPRO ) 20 MG tablet, Take 20 mg by mouth at bedtime. , Disp: ,  Rfl:    fluticasone  (FLONASE ) 50 MCG/ACT nasal spray, Place 1 spray into both nostrils daily., Disp: 16 mL, Rfl: 2   fluticasone -salmeterol (ADVAIR HFA) 115-21 MCG/ACT inhaler, Inhale 2 puffs into the lungs 2 (two) times daily., Disp: 1 each, Rfl: 12   gabapentin (NEURONTIN) 300 MG capsule, Take 3 capsules by mouth at bedtime., Disp: , Rfl:    hydroxychloroquine  (PLAQUENIL ) 200 MG tablet, TAKE 1 TABLET BY MOUTH TWICE DAILY MONDAY THROUGH FRIDAY ONLY (NONE ON SATURDAY OR SUNDAY), Disp: 120 tablet, Rfl: 0  ibuprofen  (ADVIL ) 800 MG tablet, Take 800 mg by mouth 3 (three) times daily., Disp: , Rfl:    ipratropium (ATROVENT ) 0.03 % nasal spray, Place 2 sprays into both nostrils every 12 (twelve) hours as needed for rhinitis., Disp: , Rfl:    LINZESS  72 MCG capsule, Take 72 mcg by mouth every other day., Disp: , Rfl:    losartan  (COZAAR ) 50 MG tablet, Take 50 mg by mouth daily., Disp: , Rfl:    metoCLOPramide  (REGLAN ) 5 MG tablet, TAKE 1 TABLET (5 MG TOTAL) BY MOUTH EVERY 8 (EIGHT) HOURS AS NEEDED FOR NAUSEA., Disp: 60 tablet, Rfl: 2   naproxen  (NAPROSYN ) 500 MG tablet, Take 500 mg by mouth as needed., Disp: , Rfl:    omeprazole  (PRILOSEC) 20 MG capsule, TAKE 2 CAPSULES BY MOUTH EVERY DAY, Disp: 180 capsule, Rfl: 1   OZEMPIC, 0.25 OR 0.5 MG/DOSE, 2 MG/1.5ML SOPN, Inject 0.25 mg into the skin at bedtime.  (Patient not taking: Reported on 12/06/2023), Disp: , Rfl:    OZEMPIC, 1 MG/DOSE, 4 MG/3ML SOPN, Inject 1 mg into the skin once a week., Disp: , Rfl:    Prucalopride Succinate  (MOTEGRITY ) 2 MG TABS, Take 1 tablet (2 mg total) by mouth daily at 8 pm., Disp: 30 tablet, Rfl: 1   SHINGRIX injection, , Disp: , Rfl:    tiZANidine  (ZANAFLEX ) 4 MG tablet, Take 1 tablet (4 mg total) by mouth every 8 (eight) hours as needed for muscle spasms., Disp: 90 tablet, Rfl: 2   traMADol  (ULTRAM ) 50 MG tablet, Take 50 mg by mouth 2 (two) times daily. prn, Disp: , Rfl:    valACYclovir (VALTREX) 500 MG tablet, Take 500 mg by  mouth 2 (two) times daily as needed., Disp: , Rfl:    zolpidem  (AMBIEN ) 5 MG tablet, TAKE 1 TABLET (5 MG TOTAL) BY MOUTH AT BEDTIME AS NEEDED FOR SLEEP., Disp: 30 tablet, Rfl: 0

## 2024-01-14 ENCOUNTER — Other Ambulatory Visit: Payer: Self-pay | Admitting: Surgical

## 2024-02-21 NOTE — Progress Notes (Deleted)
 Office Visit Note  Patient: [name]             Date of Birth: 1971-08-28           MRN: [id number]             PCP: Deatra James, MD Referring: Deatra James, MD Visit Date: 03/06/2024 Occupation: @GUAROCC @  Subjective:  No chief complaint on file.   History of Present Illness: [name] is a 53 y.o. female ***     Activities of Daily Living:  Patient reports morning stiffness for *** {minute/hour:19697}.   Patient {ACTIONS;DENIES/REPORTS:21021675::"Denies"} nocturnal pain.  Difficulty dressing/grooming: {ACTIONS;DENIES/REPORTS:21021675::"Denies"} Difficulty climbing stairs: {ACTIONS;DENIES/REPORTS:21021675::"Denies"} Difficulty getting out of chair: {ACTIONS;DENIES/REPORTS:21021675::"Denies"} Difficulty using hands for taps, buttons, cutlery, and/or writing: {ACTIONS;DENIES/REPORTS:21021675::"Denies"}  No Rheumatology ROS completed.   PMFS History:  Patient Active Problem List   Diagnosis Date Noted   Regurgitation of food 01/23/2023   Fibromyalgia 12/25/2016   Other fatigue 12/25/2016   Primary insomnia 12/25/2016   Primary osteoarthritis of both knees 12/25/2016   ANA positive 12/25/2016   History of gastroesophageal reflux (GERD) 12/25/2016   History of anemia 12/25/2016   Trochanteric bursitis of both hips 12/25/2016   Anemia, iron deficiency 04/18/2015   Dysphagia, pharyngoesophageal phase 11/14/2011   Esophageal reflux 11/14/2011    Past Medical History:  Diagnosis Date   ADD (attention deficit disorder with hyperactivity)    Allergic rhinitis    Anxiety    Arthritis    Depression    Diabetes mellitus without complication (HCC)    Dysmenorrhea    Fibromyalgia    GERD (gastroesophageal reflux disease)    H. pylori infection    per patient    HLD (hyperlipidemia)    HTN (hypertension)    Insomnia    Irritable bowel syndrome    Restless leg syndrome    Sleep apnea     Family History  Problem Relation Age of Onset    Breast cancer Maternal Grandmother    Diabetes Sister    Irritable bowel syndrome Sister    Heart disease Brother    Heart attack Brother    Stroke Mother    Heart disease Mother    Diabetes Sister    Irritable bowel syndrome Sister    Fibromyalgia Daughter    Colon cancer Neg Hx    Colon polyps Neg Hx    Esophageal cancer Neg Hx    Kidney disease Neg Hx    Gallbladder disease Neg Hx    Stomach cancer Neg Hx    Past Surgical History:  Procedure Laterality Date   BUNIONECTOMY Left 2008   DILATION AND CURETTAGE OF UTERUS  2004   EAR CYST EXCISION Left 1983   ESOPHAGEAL MANOMETRY N/A 01/02/2023   Procedure: ESOPHAGEAL MANOMETRY (EM);  Surgeon: Sherrilyn Rist, MD;  Location: [facility];  Service: Gastroenterology;  Laterality: N/A;   KNEE ARTHROSCOPY Right    with knee cap adjustment   PH IMPEDANCE STUDY N/A 01/02/2023   Procedure: PH IMPEDANCE STUDY;  Surgeon: Sherrilyn Rist, MD;  Location: [facility];  Service: Gastroenterology;  Laterality: N/A;   UPPER GASTROINTESTINAL ENDOSCOPY  06/29/2020   Social History   Social History Narrative   Not on file   Immunization History  Administered Date(s) Administered   Influenza Split 09/01/2014, 09/26/2016, 10/03/2018   Influenza, Quadrivalent, Recombinant, Inj, Pf 09/21/2020, 09/12/2021   Influenza,inj,Quad PF,6+ Mos 10/03/2018   Influenza-Unspecified 09/12/2021   PFIZER(Purple Top)SARS-COV-2 Vaccination 03/11/2020, 04/01/2020, 12/08/2020  PNEUMOCOCCAL CONJUGATE-20 05/04/2022   Zoster, Live 05/04/2022     Objective: Vital Signs: There were no vitals taken for this visit.   Physical Exam   Musculoskeletal Exam: ***  CDAI Exam: CDAI Score: -- Patient Global: --; Provider Global: -- Swollen: --; Tender: -- Joint Exam 03/06/2024   No joint exam has been documented for this visit   There is currently no information documented on the homunculus. Go to the Rheumatology activity and complete the homunculus joint  exam.  Investigation: No additional findings.  Imaging: No results found.  Recent Labs: Lab Results  Component Value Date   WBC 5.7 12/06/2023   HGB 13.4 12/06/2023   PLT 322 12/06/2023   NA 141 12/06/2023   K 4.2 12/06/2023   CL 101 12/06/2023   CO2 29 12/06/2023   GLUCOSE 150 (H) 12/06/2023   BUN 10 12/06/2023   CREATININE 0.93 12/06/2023   BILITOT 0.4 12/06/2023   ALKPHOS 88 05/26/2023   AST 13 12/06/2023   ALT 20 12/06/2023   PROT 7.8 12/06/2023   ALBUMIN 4.0 05/26/2023   CALCIUM 9.5 12/06/2023   GFRAA >60 12/28/2019    Speciality Comments: PLQ eye exam: 06/14/2023 normal @ [facility]. Follow up in 1 year.  Procedures:  No procedures performed Allergies: Lisinopril, Methylphenidate, and Prednisone   Assessment / Plan:     Visit Diagnoses: Undifferentiated connective tissue disease (HCC)  High risk medication use  Chronic pain of left knee  Primary osteoarthritis of both knees  Trochanteric bursitis of both hips  H/O multiple pulmonary nodules  Fibromyalgia  Other fatigue  Primary insomnia  Vitamin D deficiency  History of diabetes mellitus  Essential hypertension  History of anemia  History of gastroesophageal reflux (GERD)  Family history of morphea-Sister  Orders: No orders of the defined types were placed in this encounter.  No orders of the defined types were placed in this encounter.   Face-to-face time spent with patient was *** minutes. Greater than 50% of time was spent in counseling and coordination of care.  Follow-Up Instructions: No follow-ups on file.   Gearldine Bienenstock, PA-C  Note - This record has been created using Dragon software.  Chart creation errors have been sought, but may not always  have been located. Such creation errors do not reflect on  the standard of medical care.

## 2024-03-06 ENCOUNTER — Ambulatory Visit: Payer: Medicare HMO | Admitting: Physician Assistant

## 2024-03-06 DIAGNOSIS — R5383 Other fatigue: Secondary | ICD-10-CM

## 2024-03-06 DIAGNOSIS — Z840 Family history of diseases of the skin and subcutaneous tissue: Secondary | ICD-10-CM

## 2024-03-06 DIAGNOSIS — G8929 Other chronic pain: Secondary | ICD-10-CM

## 2024-03-06 DIAGNOSIS — Z87898 Personal history of other specified conditions: Secondary | ICD-10-CM

## 2024-03-06 DIAGNOSIS — F5101 Primary insomnia: Secondary | ICD-10-CM

## 2024-03-06 DIAGNOSIS — M797 Fibromyalgia: Secondary | ICD-10-CM

## 2024-03-06 DIAGNOSIS — E559 Vitamin D deficiency, unspecified: Secondary | ICD-10-CM

## 2024-03-06 DIAGNOSIS — Z79899 Other long term (current) drug therapy: Secondary | ICD-10-CM

## 2024-03-06 DIAGNOSIS — Z862 Personal history of diseases of the blood and blood-forming organs and certain disorders involving the immune mechanism: Secondary | ICD-10-CM

## 2024-03-06 DIAGNOSIS — M7061 Trochanteric bursitis, right hip: Secondary | ICD-10-CM

## 2024-03-06 DIAGNOSIS — M170 Bilateral primary osteoarthritis of knee: Secondary | ICD-10-CM

## 2024-03-06 DIAGNOSIS — M359 Systemic involvement of connective tissue, unspecified: Secondary | ICD-10-CM

## 2024-03-06 DIAGNOSIS — Z8719 Personal history of other diseases of the digestive system: Secondary | ICD-10-CM

## 2024-03-06 DIAGNOSIS — Z8639 Personal history of other endocrine, nutritional and metabolic disease: Secondary | ICD-10-CM

## 2024-03-06 DIAGNOSIS — I10 Essential (primary) hypertension: Secondary | ICD-10-CM

## 2024-03-10 NOTE — Progress Notes (Deleted)
 Office Visit Note  Patient: [name]             Date of Birth: 05/12/1971           MRN: [id number]             PCP: Deatra James, MD Referring: Deatra James, MD Visit Date: 03/24/2024 Occupation: @GUAROCC @  Subjective:    History of Present Illness: [name] is a 53 y.o. female with history of undifferentiated connective tissue disease and fibromyalgia. Patient is taking plaquenil 200 mg 1 tablet by mouth daily Monday through Friday.    Lab work from 12/06/23 was reviewed today in the office: ANA 1:320NS, 1:1280 nuclear cytoplasmic, ESR WNL, C3 216, C4 WNL, dsDNA negative, urine protein creatinine ratio WNL.    CBC and CMP updated on 12/06/23 PLQ eye exam: 06/14/2023 normal @ [facility]. Follow up in 1 year.    Activities of Daily Living:  Patient reports morning stiffness for *** {minute/hour:19697}.   Patient {ACTIONS;DENIES/REPORTS:21021675::"Denies"} nocturnal pain.  Difficulty dressing/grooming: {ACTIONS;DENIES/REPORTS:21021675::"Denies"} Difficulty climbing stairs: {ACTIONS;DENIES/REPORTS:21021675::"Denies"} Difficulty getting out of chair: {ACTIONS;DENIES/REPORTS:21021675::"Denies"} Difficulty using hands for taps, buttons, cutlery, and/or writing: {ACTIONS;DENIES/REPORTS:21021675::"Denies"}  No Rheumatology ROS completed.   PMFS History:  Patient Active Problem List   Diagnosis Date Noted   Regurgitation of food 01/23/2023   Fibromyalgia 12/25/2016   Other fatigue 12/25/2016   Primary insomnia 12/25/2016   Primary osteoarthritis of both knees 12/25/2016   ANA positive 12/25/2016   History of gastroesophageal reflux (GERD) 12/25/2016   History of anemia 12/25/2016   Trochanteric bursitis of both hips 12/25/2016   Anemia, iron deficiency 04/18/2015   Dysphagia, pharyngoesophageal phase 11/14/2011   Esophageal reflux 11/14/2011    Past Medical History:  Diagnosis Date   ADD (attention deficit disorder with hyperactivity)     Allergic rhinitis    Anxiety    Arthritis    Depression    Diabetes mellitus without complication (HCC)    Dysmenorrhea    Fibromyalgia    GERD (gastroesophageal reflux disease)    H. pylori infection    per patient    HLD (hyperlipidemia)    HTN (hypertension)    Insomnia    Irritable bowel syndrome    Restless leg syndrome    Sleep apnea     Family History  Problem Relation Age of Onset   Breast cancer Maternal Grandmother    Diabetes Sister    Irritable bowel syndrome Sister    Heart disease Brother    Heart attack Brother    Stroke Mother    Heart disease Mother    Diabetes Sister    Irritable bowel syndrome Sister    Fibromyalgia Daughter    Colon cancer Neg Hx    Colon polyps Neg Hx    Esophageal cancer Neg Hx    Kidney disease Neg Hx    Gallbladder disease Neg Hx    Stomach cancer Neg Hx    Past Surgical History:  Procedure Laterality Date   BUNIONECTOMY Left 2008   DILATION AND CURETTAGE OF UTERUS  2004   EAR CYST EXCISION Left 1983   ESOPHAGEAL MANOMETRY N/A 01/02/2023   Procedure: ESOPHAGEAL MANOMETRY (EM);  Surgeon: Sherrilyn Rist, MD;  Location: [facility];  Service: Gastroenterology;  Laterality: N/A;   KNEE ARTHROSCOPY Right    with knee cap adjustment   PH IMPEDANCE STUDY N/A 01/02/2023   Procedure: PH IMPEDANCE STUDY;  Surgeon: Sherrilyn Rist, MD;  Location: [facility];  Service: Gastroenterology;  Laterality: N/A;   UPPER GASTROINTESTINAL ENDOSCOPY  06/29/2020   Social History   Social History Narrative   Not on file   Immunization History  Administered Date(s) Administered   Influenza Split 09/01/2014, 09/26/2016, 10/03/2018   Influenza, Quadrivalent, Recombinant, Inj, Pf 09/21/2020, 09/12/2021   Influenza,inj,Quad PF,6+ Mos 10/03/2018   Influenza-Unspecified 09/12/2021   PFIZER(Purple Top)SARS-COV-2 Vaccination 03/11/2020, 04/01/2020, 12/08/2020   PNEUMOCOCCAL CONJUGATE-20 05/04/2022   Zoster, Live 05/04/2022      Objective: Vital Signs: There were no vitals taken for this visit.   Physical Exam Vitals and nursing note reviewed.  Constitutional:      Appearance: She is well-developed.  HENT:     Head: Normocephalic and atraumatic.  Eyes:     Conjunctiva/sclera: Conjunctivae normal.  Cardiovascular:     Rate and Rhythm: Normal rate and regular rhythm.     Heart sounds: Normal heart sounds.  Pulmonary:     Effort: Pulmonary effort is normal.     Breath sounds: Normal breath sounds.  Abdominal:     General: Bowel sounds are normal.     Palpations: Abdomen is soft.  Musculoskeletal:     Cervical back: Normal range of motion.  Lymphadenopathy:     Cervical: No cervical adenopathy.  Skin:    General: Skin is warm and dry.     Capillary Refill: Capillary refill takes less than 2 seconds.  Neurological:     Mental Status: She is alert and oriented to person, place, and time.  Psychiatric:        Behavior: Behavior normal.      Musculoskeletal Exam: ***  CDAI Exam: CDAI Score: -- Patient Global: --; Provider Global: -- Swollen: --; Tender: -- Joint Exam 03/24/2024   No joint exam has been documented for this visit   There is currently no information documented on the homunculus. Go to the Rheumatology activity and complete the homunculus joint exam.  Investigation: No additional findings.  Imaging: No results found.  Recent Labs: Lab Results  Component Value Date   WBC 5.7 12/06/2023   HGB 13.4 12/06/2023   PLT 322 12/06/2023   NA 141 12/06/2023   K 4.2 12/06/2023   CL 101 12/06/2023   CO2 29 12/06/2023   GLUCOSE 150 (H) 12/06/2023   BUN 10 12/06/2023   CREATININE 0.93 12/06/2023   BILITOT 0.4 12/06/2023   ALKPHOS 88 05/26/2023   AST 13 12/06/2023   ALT 20 12/06/2023   PROT 7.8 12/06/2023   ALBUMIN 4.0 05/26/2023   CALCIUM 9.5 12/06/2023   GFRAA >60 12/28/2019    Speciality Comments: PLQ eye exam: 06/14/2023 normal @ [facility]. Follow up in 1  year.  Procedures:  No procedures performed Allergies: Lisinopril, Methylphenidate, and Prednisone   Assessment / Plan:     Visit Diagnoses: Undifferentiated connective tissue disease (HCC)  High risk medication use  Chronic pain of left knee  Primary osteoarthritis of both knees  Trochanteric bursitis of both hips  H/O multiple pulmonary nodules  Fibromyalgia  Other fatigue  Primary insomnia  History of diabetes mellitus  Vitamin D deficiency  Essential hypertension  History of gastroesophageal reflux (GERD)  History of anemia  Family history of morphea-Sister  Orders: No orders of the defined types were placed in this encounter.  No orders of the defined types were placed in this encounter.   Face-to-face time spent with patient was *** minutes. Greater than 50% of time was spent in counseling and coordination  of care.  Follow-Up Instructions: No follow-ups on file.   Gearldine Bienenstock, PA-C  Note - This record has been created using Dragon software.  Chart creation errors have been sought, but may not always  have been located. Such creation errors do not reflect on  the standard of medical care.

## 2024-03-23 DIAGNOSIS — G4700 Insomnia, unspecified: Secondary | ICD-10-CM | POA: Diagnosis not present

## 2024-03-23 DIAGNOSIS — F902 Attention-deficit hyperactivity disorder, combined type: Secondary | ICD-10-CM | POA: Diagnosis not present

## 2024-03-23 DIAGNOSIS — E1140 Type 2 diabetes mellitus with diabetic neuropathy, unspecified: Secondary | ICD-10-CM | POA: Diagnosis not present

## 2024-03-23 DIAGNOSIS — M797 Fibromyalgia: Secondary | ICD-10-CM | POA: Diagnosis not present

## 2024-03-23 DIAGNOSIS — F330 Major depressive disorder, recurrent, mild: Secondary | ICD-10-CM | POA: Diagnosis not present

## 2024-03-23 DIAGNOSIS — I10 Essential (primary) hypertension: Secondary | ICD-10-CM | POA: Diagnosis not present

## 2024-03-23 DIAGNOSIS — E7800 Pure hypercholesterolemia, unspecified: Secondary | ICD-10-CM | POA: Diagnosis not present

## 2024-03-23 DIAGNOSIS — M2011 Hallux valgus (acquired), right foot: Secondary | ICD-10-CM | POA: Diagnosis not present

## 2024-03-24 ENCOUNTER — Ambulatory Visit: Payer: Self-pay | Admitting: Physician Assistant

## 2024-03-24 DIAGNOSIS — F5101 Primary insomnia: Secondary | ICD-10-CM

## 2024-03-24 DIAGNOSIS — E559 Vitamin D deficiency, unspecified: Secondary | ICD-10-CM

## 2024-03-24 DIAGNOSIS — M797 Fibromyalgia: Secondary | ICD-10-CM

## 2024-03-24 DIAGNOSIS — Z79899 Other long term (current) drug therapy: Secondary | ICD-10-CM

## 2024-03-24 DIAGNOSIS — Z8639 Personal history of other endocrine, nutritional and metabolic disease: Secondary | ICD-10-CM

## 2024-03-24 DIAGNOSIS — R5383 Other fatigue: Secondary | ICD-10-CM

## 2024-03-24 DIAGNOSIS — M359 Systemic involvement of connective tissue, unspecified: Secondary | ICD-10-CM

## 2024-03-24 DIAGNOSIS — M170 Bilateral primary osteoarthritis of knee: Secondary | ICD-10-CM

## 2024-03-24 DIAGNOSIS — M7061 Trochanteric bursitis, right hip: Secondary | ICD-10-CM

## 2024-03-24 DIAGNOSIS — I10 Essential (primary) hypertension: Secondary | ICD-10-CM

## 2024-03-24 DIAGNOSIS — Z840 Family history of diseases of the skin and subcutaneous tissue: Secondary | ICD-10-CM

## 2024-03-24 DIAGNOSIS — G8929 Other chronic pain: Secondary | ICD-10-CM

## 2024-03-24 DIAGNOSIS — Z862 Personal history of diseases of the blood and blood-forming organs and certain disorders involving the immune mechanism: Secondary | ICD-10-CM

## 2024-03-24 DIAGNOSIS — Z87898 Personal history of other specified conditions: Secondary | ICD-10-CM

## 2024-03-24 DIAGNOSIS — Z8719 Personal history of other diseases of the digestive system: Secondary | ICD-10-CM

## 2024-03-26 ENCOUNTER — Other Ambulatory Visit: Payer: Self-pay | Admitting: *Deleted

## 2024-03-26 MED ORDER — HYDROXYCHLOROQUINE SULFATE 200 MG PO TABS: ORAL_TABLET | ORAL | 0 refills | Status: DC

## 2024-03-26 MED ORDER — DICLOFENAC SODIUM 1 % EX GEL: CUTANEOUS | 1 refills | Status: AC

## 2024-03-26 NOTE — Telephone Encounter (Signed)
 Refill request received via fax from [facility] for Plaquenil and Voltaren Gel  Last Fill: 07/05/2023 (PLQ), 11/06/2023 (Voltaren Gel)  Eye exam:  06/14/2023 normal   Labs: 12/06/2023 MCV is low.  Patient should take multivitamin with iron.  Glucose is elevated at 150.  ANA is positive and stable titer, C3-C4 normal, urine protein creatinine ratio negative, sed rate normal, double-stranded DNA negative.  Labs do not indicate an autoimmune disease flare.   Next Visit: 04/09/2024  Last Visit: 12/06/2023  DX: Undifferentiated connective tissue disease   Current Dose per office note 12/06/2023: Plaquenil: 200 mg 1 tablet by mouth daily Monday through Friday.   Okay to refill Plaquenil and Voltaren Gel?

## 2024-03-26 NOTE — Progress Notes (Deleted)
 Office Visit Note  Patient: [name]             Date of Birth: 1971/05/16           MRN: [id number]             PCP: Deatra James, MD Referring: Deatra James, MD Visit Date: 04/09/2024 Occupation: @GUAROCC @  Subjective:    History of Present Illness: [name] is a 53 y.o. female with history of undifferentiated connective tissue disease and fibromyalgia. Patient is taking plaquenil 200 mg 1 tablet by mouth daily Monday through Friday.    Lab work from 12/06/23 was reviewed today in the office: ANA 1:320NS, 1:1280 nuclear cytoplasmic, ESR WNL, C3 216, C4 WNL, dsDNA negative, urine protein creatinine ratio WNL.    CBC and CMP updated on 12/06/23 PLQ eye exam: 06/14/2023 normal @ [facility]. Follow up in 1 year.    Activities of Daily Living:  Patient reports morning stiffness for *** {minute/hour:19697}.   Patient {ACTIONS;DENIES/REPORTS:21021675::"Denies"} nocturnal pain.  Difficulty dressing/grooming: {ACTIONS;DENIES/REPORTS:21021675::"Denies"} Difficulty climbing stairs: {ACTIONS;DENIES/REPORTS:21021675::"Denies"} Difficulty getting out of chair: {ACTIONS;DENIES/REPORTS:21021675::"Denies"} Difficulty using hands for taps, buttons, cutlery, and/or writing: {ACTIONS;DENIES/REPORTS:21021675::"Denies"}  No Rheumatology ROS completed.   PMFS History:  Patient Active Problem List   Diagnosis Date Noted  . Regurgitation of food 01/23/2023  . Fibromyalgia 12/25/2016  . Other fatigue 12/25/2016  . Primary insomnia 12/25/2016  . Primary osteoarthritis of both knees 12/25/2016  . ANA positive 12/25/2016  . History of gastroesophageal reflux (GERD) 12/25/2016  . History of anemia 12/25/2016  . Trochanteric bursitis of both hips 12/25/2016  . Anemia, iron deficiency 04/18/2015  . Dysphagia, pharyngoesophageal phase 11/14/2011  . Esophageal reflux 11/14/2011    Past Medical History:  Diagnosis Date  . ADD (attention deficit disorder with  hyperactivity)   . Allergic rhinitis   . Anxiety   . Arthritis   . Depression   . Diabetes mellitus without complication (HCC)   . Dysmenorrhea   . Fibromyalgia   . GERD (gastroesophageal reflux disease)   . H. pylori infection    per patient   . HLD (hyperlipidemia)   . HTN (hypertension)   . Insomnia   . Irritable bowel syndrome   . Restless leg syndrome   . Sleep apnea     Family History  Problem Relation Age of Onset  . Breast cancer Maternal Grandmother   . Diabetes Sister   . Irritable bowel syndrome Sister   . Heart disease Brother   . Heart attack Brother   . Stroke Mother   . Heart disease Mother   . Diabetes Sister   . Irritable bowel syndrome Sister   . Fibromyalgia Daughter   . Colon cancer Neg Hx   . Colon polyps Neg Hx   . Esophageal cancer Neg Hx   . Kidney disease Neg Hx   . Gallbladder disease Neg Hx   . Stomach cancer Neg Hx    Past Surgical History:  Procedure Laterality Date  . BUNIONECTOMY Left 2008  . DILATION AND CURETTAGE OF UTERUS  2004  . EAR CYST EXCISION Left 1983  . ESOPHAGEAL MANOMETRY N/A 01/02/2023   Procedure: ESOPHAGEAL MANOMETRY (EM);  Surgeon: Sherrilyn Rist, MD;  Location: [facility];  Service: Gastroenterology;  Laterality: N/A;  . KNEE ARTHROSCOPY Right    with knee cap adjustment  . PH IMPEDANCE STUDY N/A 01/02/2023   Procedure: PH IMPEDANCE STUDY;  Surgeon: Sherrilyn Rist, MD;  Location: [facility];  Service: Gastroenterology;  Laterality: N/A;  . UPPER GASTROINTESTINAL ENDOSCOPY  06/29/2020   Social History   Social History Narrative  . Not on file   Immunization History  Administered Date(s) Administered  . Influenza Split 09/01/2014, 09/26/2016, 10/03/2018  . Influenza, Quadrivalent, Recombinant, Inj, Pf 09/21/2020, 09/12/2021  . Influenza,inj,Quad PF,6+ Mos 10/03/2018  . Influenza-Unspecified 09/12/2021  . PFIZER(Purple Top)SARS-COV-2 Vaccination 03/11/2020, 04/01/2020, 12/08/2020  . PNEUMOCOCCAL  CONJUGATE-20 05/04/2022  . Zoster, Live 05/04/2022     Objective: Vital Signs: There were no vitals taken for this visit.   Physical Exam Vitals and nursing note reviewed.  Constitutional:      Appearance: She is well-developed.  HENT:     Head: Normocephalic and atraumatic.  Eyes:     Conjunctiva/sclera: Conjunctivae normal.  Cardiovascular:     Rate and Rhythm: Normal rate and regular rhythm.     Heart sounds: Normal heart sounds.  Pulmonary:     Effort: Pulmonary effort is normal.     Breath sounds: Normal breath sounds.  Abdominal:     General: Bowel sounds are normal.     Palpations: Abdomen is soft.  Musculoskeletal:     Cervical back: Normal range of motion.  Lymphadenopathy:     Cervical: No cervical adenopathy.  Skin:    General: Skin is warm and dry.     Capillary Refill: Capillary refill takes less than 2 seconds.  Neurological:     Mental Status: She is alert and oriented to person, place, and time.  Psychiatric:        Behavior: Behavior normal.     Musculoskeletal Exam: ***  CDAI Exam: CDAI Score: -- Patient Global: --; Provider Global: -- Swollen: --; Tender: -- Joint Exam 04/09/2024   No joint exam has been documented for this visit   There is currently no information documented on the homunculus. Go to the Rheumatology activity and complete the homunculus joint exam.  Investigation: No additional findings.  Imaging: No results found.  Recent Labs: Lab Results  Component Value Date   WBC 5.7 12/06/2023   HGB 13.4 12/06/2023   PLT 322 12/06/2023   NA 141 12/06/2023   K 4.2 12/06/2023   CL 101 12/06/2023   CO2 29 12/06/2023   GLUCOSE 150 (H) 12/06/2023   BUN 10 12/06/2023   CREATININE 0.93 12/06/2023   BILITOT 0.4 12/06/2023   ALKPHOS 88 05/26/2023   AST 13 12/06/2023   ALT 20 12/06/2023   PROT 7.8 12/06/2023   ALBUMIN 4.0 05/26/2023   CALCIUM 9.5 12/06/2023   GFRAA >60 12/28/2019    Speciality Comments: PLQ eye exam:  06/14/2023 normal @ [facility]. Follow up in 1 year.  Procedures:  No procedures performed Allergies: Lisinopril, Methylphenidate, and Prednisone   Assessment / Plan:     Visit Diagnoses: Undifferentiated connective tissue disease (HCC)  High risk medication use  Chronic pain of left knee  Primary osteoarthritis of both knees  Trochanteric bursitis of both hips  H/O multiple pulmonary nodules  Fibromyalgia  Other fatigue  Primary insomnia  History of diabetes mellitus  Vitamin D deficiency  Essential hypertension  History of gastroesophageal reflux (GERD)  History of anemia  Family history of morphea-Sister  Orders: No orders of the defined types were placed in this encounter.  No orders of the defined types were placed in this encounter.   Face-to-face time spent with patient was *** minutes. Greater than 50% of time was spent in counseling and coordination of  care.  Follow-Up Instructions: No follow-ups on file.   Gearldine Bienenstock, PA-C  Note - This record has been created using Dragon software.  Chart creation errors have been sought, but may not always  have been located. Such creation errors do not reflect on  the standard of medical care.

## 2024-03-27 ENCOUNTER — Other Ambulatory Visit: Payer: Self-pay

## 2024-03-27 MED ORDER — OMEPRAZOLE 20 MG PO CPDR: 40.0000 mg | DELAYED_RELEASE_CAPSULE | Freq: Every day | ORAL | 0 refills | Status: DC

## 2024-03-27 MED ORDER — LINZESS 72 MCG PO CAPS: 72.0000 ug | ORAL_CAPSULE | ORAL | 2 refills | Status: AC

## 2024-03-27 NOTE — Telephone Encounter (Signed)
 I refilled [name]'s Linzess and omeprazole. Note attached for her to call for yearly appointment.

## 2024-04-07 ENCOUNTER — Telehealth: Payer: Self-pay | Admitting: Surgical

## 2024-04-07 NOTE — Telephone Encounter (Signed)
 Sarah with [facility] calling regarding a Rx for Tizanidine  4mg   Isa Manuel call back number [phone]

## 2024-04-08 NOTE — Telephone Encounter (Signed)
 I would say either return office visit or Katheran Palms can prescribe it if she feels she wants to do that

## 2024-04-09 ENCOUNTER — Ambulatory Visit: Payer: Self-pay | Admitting: Physician Assistant

## 2024-04-09 DIAGNOSIS — Z8719 Personal history of other diseases of the digestive system: Secondary | ICD-10-CM

## 2024-04-09 DIAGNOSIS — R5383 Other fatigue: Secondary | ICD-10-CM

## 2024-04-09 DIAGNOSIS — Z79899 Other long term (current) drug therapy: Secondary | ICD-10-CM

## 2024-04-09 DIAGNOSIS — E559 Vitamin D deficiency, unspecified: Secondary | ICD-10-CM

## 2024-04-09 DIAGNOSIS — M170 Bilateral primary osteoarthritis of knee: Secondary | ICD-10-CM

## 2024-04-09 DIAGNOSIS — Z862 Personal history of diseases of the blood and blood-forming organs and certain disorders involving the immune mechanism: Secondary | ICD-10-CM

## 2024-04-09 DIAGNOSIS — M797 Fibromyalgia: Secondary | ICD-10-CM

## 2024-04-09 DIAGNOSIS — M359 Systemic involvement of connective tissue, unspecified: Secondary | ICD-10-CM

## 2024-04-09 DIAGNOSIS — Z8639 Personal history of other endocrine, nutritional and metabolic disease: Secondary | ICD-10-CM

## 2024-04-09 DIAGNOSIS — Z87898 Personal history of other specified conditions: Secondary | ICD-10-CM

## 2024-04-09 DIAGNOSIS — Z840 Family history of diseases of the skin and subcutaneous tissue: Secondary | ICD-10-CM

## 2024-04-09 DIAGNOSIS — I10 Essential (primary) hypertension: Secondary | ICD-10-CM

## 2024-04-09 DIAGNOSIS — F5101 Primary insomnia: Secondary | ICD-10-CM

## 2024-04-09 DIAGNOSIS — M7061 Trochanteric bursitis, right hip: Secondary | ICD-10-CM

## 2024-04-09 DIAGNOSIS — G8929 Other chronic pain: Secondary | ICD-10-CM

## 2024-04-09 NOTE — Telephone Encounter (Signed)
Called and advised [facility]

## 2024-04-14 MED ORDER — TIZANIDINE HCL 4 MG PO TABS: 4.0000 mg | ORAL_TABLET | Freq: Three times a day (TID) | ORAL | 0 refills | Status: DC | PRN

## 2024-04-14 NOTE — Telephone Encounter (Signed)
 Patient called. Says the pharmacy is waiting for Dr. Rozelle Corning to sign off on the medication refill tizanidine . Her cb# [phone]. Pharmacy is [facility]

## 2024-04-14 NOTE — Telephone Encounter (Signed)
 Sure thing!

## 2024-04-14 NOTE — Telephone Encounter (Signed)
 Refill sent. Pt was advised

## 2024-04-15 ENCOUNTER — Telehealth: Payer: Self-pay | Admitting: Radiology

## 2024-04-15 NOTE — Telephone Encounter (Signed)
 Received fax from [facility] stating patient has requested to have their prescription for Tizanidine  sent to them.  Per chart, [facility] sent in yesterday.

## 2024-04-20 ENCOUNTER — Ambulatory Visit (INDEPENDENT_AMBULATORY_CARE_PROVIDER_SITE_OTHER)

## 2024-04-20 ENCOUNTER — Ambulatory Visit: Admitting: Podiatry

## 2024-04-20 DIAGNOSIS — M2041 Other hammer toe(s) (acquired), right foot: Secondary | ICD-10-CM

## 2024-04-20 DIAGNOSIS — M2011 Hallux valgus (acquired), right foot: Secondary | ICD-10-CM

## 2024-04-20 DIAGNOSIS — M21611 Bunion of right foot: Secondary | ICD-10-CM

## 2024-04-20 DIAGNOSIS — M21621 Bunionette of right foot: Secondary | ICD-10-CM

## 2024-04-20 DIAGNOSIS — M205X1 Other deformities of toe(s) (acquired), right foot: Secondary | ICD-10-CM

## 2024-04-20 NOTE — Progress Notes (Unsigned)
 Chief Complaint  Patient presents with   Bunions    Right foot bunion. R2 hammertoe. 7 pain bothering for 6 months. NIDDM A1C 6.5    HPI: 53 y.o. female presents today with painful right bunion, tailor's bunion and right second hammertoe.  States has been an ongoing issue for her for the past year.  She feels like it is progressively worsening.  She is diabetic and states that she has diabetic neuropathy and borderline lupus.  She sees [facility] rheumatology currently.  She takes Plaquenil .  She would like to discuss surgery and would like to have it done as soon as possible.  Past Medical History:  Diagnosis Date   ADD (attention deficit disorder with hyperactivity)    Allergic rhinitis    Anxiety    Arthritis    Depression    Diabetes mellitus without complication (HCC)    Dysmenorrhea    Fibromyalgia    GERD (gastroesophageal reflux disease)    H. pylori infection    per patient    HLD (hyperlipidemia)    HTN (hypertension)    Insomnia    Irritable bowel syndrome    Restless leg syndrome    Sleep apnea     Past Surgical History:  Procedure Laterality Date   BUNIONECTOMY Left 2008   DILATION AND CURETTAGE OF UTERUS  2004   EAR CYST EXCISION Left 1983   ESOPHAGEAL MANOMETRY N/A 01/02/2023   Procedure: ESOPHAGEAL MANOMETRY (EM);  Surgeon: Albertina Hugger, MD;  Location: [facility];  Service: Gastroenterology;  Laterality: N/A;   KNEE ARTHROSCOPY Right    with knee cap adjustment   PH IMPEDANCE STUDY N/A 01/02/2023   Procedure: PH IMPEDANCE STUDY;  Surgeon: Albertina Hugger, MD;  Location: [facility];  Service: Gastroenterology;  Laterality: N/A;   UPPER GASTROINTESTINAL ENDOSCOPY  06/29/2020    Allergies  Allergen Reactions   Lisinopril     Cough    Methylphenidate Other (See Comments)    Other reaction(s): palpitations   Prednisone     Other reaction(s): rapid heartbeat    Physical Exam: General: The patient is alert and oriented x3 in no acute  distress.  Dermatology: Skin is warm, dry and supple bilateral lower extremities. Interspaces are clear of maceration and debris.    Vascular: Palpable pedal pulses bilaterally. Capillary refill within normal limits.  No appreciable edema.  No erythema or calor.  Neurological: Protective sensation intact using a Semmes Weinstein monofilament to the right foot  Musculoskeletal Exam: Bony prominence on the medial aspect first metatarsal head with lateral angulation of the right hallux.  This is manually reducible.  Mild decreased range of motion of the first MPJ without crepitus.  Contracture of the right second toe at the PIP joint.  Pain on palpation of the second metatarsal head.  Pain on palpation to the lateral prominence at the fifth metatarsal head on the right foot.  There is adduction and varus rotation of the right fifth toe.  Radiographic Exam (right foot, 3 weightbearing views, 04/20/2024):  Normal osseous mineralization.  First intermetatarsal angle is approximately 7 degrees.  Tibial sesamoid position is 3.  Mild uneven joint space narrowing at the second metatarsophalangeal joint.  Slightly elongated second metatarsal.  There is increased hallux abduction angle.  Increased metatarsus adductus angle.  Slight bony enlargement at the lateral aspect of the fifth metatarsal head.  There is medial angulation of the fifth toe at the level of the DIPJ.  There  is joint space narrowing and contracture of the second toe at the PIPJ.  Small inferior calcaneal spur noted  Assessment/Plan of Care: 1. Hav (hallux abducto valgus), right   2. Bunion, right foot   3. Hammertoe of right foot   4. Tailor's bunion of right foot   5. Adductovarus rotation of toe, acquired, right    Discussed clinical and radiographic findings with patient today.  Patient would like to proceed with surgical correction of these deformities.  However, she was informed that her insurance will most likely not authorize this  procedure at her first visit without some time to try conservative measures.  Patient feels that she already has exhausted conservative measures but we will need to document conservative measures over the next month.  She was given toe spacers today.  Discussed shoe gear and accommodation.  Will reevaluate in 1 month and begin surgical planning  We discussed an Austin/Akin bunionectomy, right second hammertoe correction with plantar condylectomy of the second metatarsal head, tailor's bunionectomy and derotational arthroplasty of the right fifth toe.  All these procedures are on the right foot.  Briefly discussed postoperative course and healing time.  She can discuss the expected timeframe for recovery with her employer.  Discussed risks involved with surgery as well.  Informed patient this would be an outpatient surgery performed under intravenous sedation with local anesthesia to the foot.  This would be scheduled for a 2-hour block for the case.  Patient would need to discontinue her Ozempic 1 to 2 weeks prior to surgery.  She may also have to adjust her Adderall perioperatively.  She will need to speak to her rheumatologist regarding dosing or discontinuation of her Plaquenil  around the time of surgery.  She will need to stop the Advil  and naproxen  that are on file 1 week prior to surgery.  It would be best if rheumatology could also clear the patient prior to surgery.  Recommended a walker or knee scooter for her postoperative course.  Follow-up in 1 month to document attempted conservative measures review surgical procedure and consent forms.   Joe Murders, DPM, FACFAS [facility]     [address] [place], [place] [address]                Office [phone]  Fax [phone]

## 2024-04-23 NOTE — Progress Notes (Deleted)
 Office Visit Note  Patient: [name]             Date of Birth: 06-29-1971           MRN: [id number]             PCP: Sun, Vyvyan, MD Referring: Sun, Vyvyan, MD Visit Date: 04/30/2024 Occupation: @GUAROCC @  Subjective:    History of Present Illness: [name] is a 53 y.o. female with history of undifferentiated connective tissue disease and fibromyalgia. Patient is taking plaquenil  200 mg 1 tablet by mouth daily Monday through Friday.    Lab work from 12/06/23 was reviewed today in the office: ANA 1:320NS, 1:1280 nuclear cytoplasmic, ESR WNL, C3 216, C4 WNL, dsDNA negative, urine protein creatinine ratio WNL.    CBC and CMP updated on 12/06/23 PLQ eye exam: 06/14/2023 normal @ [facility]. Follow up in 1 year.    Activities of Daily Living:  Patient reports morning stiffness for *** {minute/hour:19697}.   Patient {ACTIONS;DENIES/REPORTS:21021675::"Denies"} nocturnal pain.  Difficulty dressing/grooming: {ACTIONS;DENIES/REPORTS:21021675::"Denies"} Difficulty climbing stairs: {ACTIONS;DENIES/REPORTS:21021675::"Denies"} Difficulty getting out of chair: {ACTIONS;DENIES/REPORTS:21021675::"Denies"} Difficulty using hands for taps, buttons, cutlery, and/or writing: {ACTIONS;DENIES/REPORTS:21021675::"Denies"}  No Rheumatology ROS completed.   PMFS History:  Patient Active Problem List   Diagnosis Date Noted  . Regurgitation of food 01/23/2023  . Fibromyalgia 12/25/2016  . Other fatigue 12/25/2016  . Primary insomnia 12/25/2016  . Primary osteoarthritis of both knees 12/25/2016  . ANA positive 12/25/2016  . History of gastroesophageal reflux (GERD) 12/25/2016  . History of anemia 12/25/2016  . Trochanteric bursitis of both hips 12/25/2016  . Anemia, iron deficiency 04/18/2015  . Dysphagia, pharyngoesophageal phase 11/14/2011  . Esophageal reflux 11/14/2011    Past Medical History:  Diagnosis Date  . ADD (attention deficit disorder with  hyperactivity)   . Allergic rhinitis   . Anxiety   . Arthritis   . Depression   . Diabetes mellitus without complication (HCC)   . Dysmenorrhea   . Fibromyalgia   . GERD (gastroesophageal reflux disease)   . H. pylori infection    per patient   . HLD (hyperlipidemia)   . HTN (hypertension)   . Insomnia   . Irritable bowel syndrome   . Restless leg syndrome   . Sleep apnea     Family History  Problem Relation Age of Onset  . Breast cancer Maternal Grandmother   . Diabetes Sister   . Irritable bowel syndrome Sister   . Heart disease Brother   . Heart attack Brother   . Stroke Mother   . Heart disease Mother   . Diabetes Sister   . Irritable bowel syndrome Sister   . Fibromyalgia Daughter   . Colon cancer Neg Hx   . Colon polyps Neg Hx   . Esophageal cancer Neg Hx   . Kidney disease Neg Hx   . Gallbladder disease Neg Hx   . Stomach cancer Neg Hx    Past Surgical History:  Procedure Laterality Date  . BUNIONECTOMY Left 2008  . DILATION AND CURETTAGE OF UTERUS  2004  . EAR CYST EXCISION Left 1983  . ESOPHAGEAL MANOMETRY N/A 01/02/2023   Procedure: ESOPHAGEAL MANOMETRY (EM);  Surgeon: Albertina Hugger, MD;  Location: [facility];  Service: Gastroenterology;  Laterality: N/A;  . KNEE ARTHROSCOPY Right    with knee cap adjustment  . PH IMPEDANCE STUDY N/A 01/02/2023   Procedure: PH IMPEDANCE STUDY;  Surgeon: Albertina Hugger, MD;  Location: [facility];  Service: Gastroenterology;  Laterality: N/A;  . UPPER GASTROINTESTINAL ENDOSCOPY  06/29/2020   Social History   Social History Narrative  . Not on file   Immunization History  Administered Date(s) Administered  . Influenza Split 09/01/2014, 09/26/2016, 10/03/2018  . Influenza, Quadrivalent, Recombinant, Inj, Pf 09/21/2020, 09/12/2021  . Influenza,inj,Quad PF,6+ Mos 10/03/2018  . Influenza-Unspecified 09/12/2021  . PFIZER(Purple Top)SARS-COV-2 Vaccination 03/11/2020, 04/01/2020, 12/08/2020  . PNEUMOCOCCAL  CONJUGATE-20 05/04/2022  . Zoster, Live 05/04/2022     Objective: Vital Signs: There were no vitals taken for this visit.   Physical Exam Vitals and nursing note reviewed.  Constitutional:      Appearance: She is well-developed.  HENT:     Head: Normocephalic and atraumatic.  Eyes:     Conjunctiva/sclera: Conjunctivae normal.  Cardiovascular:     Rate and Rhythm: Normal rate and regular rhythm.     Heart sounds: Normal heart sounds.  Pulmonary:     Effort: Pulmonary effort is normal.     Breath sounds: Normal breath sounds.  Abdominal:     General: Bowel sounds are normal.     Palpations: Abdomen is soft.  Musculoskeletal:     Cervical back: Normal range of motion.  Lymphadenopathy:     Cervical: No cervical adenopathy.  Skin:    General: Skin is warm and dry.     Capillary Refill: Capillary refill takes less than 2 seconds.  Neurological:     Mental Status: She is alert and oriented to person, place, and time.  Psychiatric:        Behavior: Behavior normal.     Musculoskeletal Exam: ***  CDAI Exam: CDAI Score: -- Patient Global: --; Provider Global: -- Swollen: --; Tender: -- Joint Exam 04/30/2024   No joint exam has been documented for this visit   There is currently no information documented on the homunculus. Go to the Rheumatology activity and complete the homunculus joint exam.  Investigation: No additional findings.  Imaging: DG Foot Complete Right Result Date: 04/20/2024 Please see detailed radiograph report in office note.   Recent Labs: Lab Results  Component Value Date   WBC 5.7 12/06/2023   HGB 13.4 12/06/2023   PLT 322 12/06/2023   NA 141 12/06/2023   K 4.2 12/06/2023   CL 101 12/06/2023   CO2 29 12/06/2023   GLUCOSE 150 (H) 12/06/2023   BUN 10 12/06/2023   CREATININE 0.93 12/06/2023   BILITOT 0.4 12/06/2023   ALKPHOS 88 05/26/2023   AST 13 12/06/2023   ALT 20 12/06/2023   PROT 7.8 12/06/2023   ALBUMIN 4.0 05/26/2023   CALCIUM   9.5 12/06/2023   GFRAA >60 12/28/2019    Speciality Comments: PLQ eye exam: 06/14/2023 normal @ [facility]. Follow up in 1 year.  Procedures:  No procedures performed Allergies: Lisinopril, Methylphenidate, and Prednisone   Assessment / Plan:     Visit Diagnoses: No diagnosis found.  Orders: No orders of the defined types were placed in this encounter.  No orders of the defined types were placed in this encounter.   Face-to-face time spent with patient was *** minutes. Greater than 50% of time was spent in counseling and coordination of care.  Follow-Up Instructions: No follow-ups on file.   Dee Farber, CMA  Note - This record has been created using Animal nutritionist.  Chart creation errors have been sought, but may not always  have been located. Such creation errors do not reflect on  the standard of medical  care.

## 2024-04-30 ENCOUNTER — Ambulatory Visit: Payer: Self-pay | Admitting: Physician Assistant

## 2024-04-30 DIAGNOSIS — M359 Systemic involvement of connective tissue, unspecified: Secondary | ICD-10-CM

## 2024-04-30 DIAGNOSIS — Z862 Personal history of diseases of the blood and blood-forming organs and certain disorders involving the immune mechanism: Secondary | ICD-10-CM

## 2024-04-30 DIAGNOSIS — Z79899 Other long term (current) drug therapy: Secondary | ICD-10-CM

## 2024-04-30 DIAGNOSIS — E559 Vitamin D deficiency, unspecified: Secondary | ICD-10-CM

## 2024-04-30 DIAGNOSIS — Z8639 Personal history of other endocrine, nutritional and metabolic disease: Secondary | ICD-10-CM

## 2024-04-30 DIAGNOSIS — M7061 Trochanteric bursitis, right hip: Secondary | ICD-10-CM

## 2024-04-30 DIAGNOSIS — R5383 Other fatigue: Secondary | ICD-10-CM

## 2024-04-30 DIAGNOSIS — M170 Bilateral primary osteoarthritis of knee: Secondary | ICD-10-CM

## 2024-04-30 DIAGNOSIS — M797 Fibromyalgia: Secondary | ICD-10-CM

## 2024-04-30 DIAGNOSIS — I10 Essential (primary) hypertension: Secondary | ICD-10-CM

## 2024-04-30 DIAGNOSIS — Z840 Family history of diseases of the skin and subcutaneous tissue: Secondary | ICD-10-CM

## 2024-04-30 DIAGNOSIS — G8929 Other chronic pain: Secondary | ICD-10-CM

## 2024-04-30 DIAGNOSIS — Z8719 Personal history of other diseases of the digestive system: Secondary | ICD-10-CM

## 2024-04-30 DIAGNOSIS — Z87898 Personal history of other specified conditions: Secondary | ICD-10-CM

## 2024-04-30 DIAGNOSIS — F5101 Primary insomnia: Secondary | ICD-10-CM

## 2024-05-19 ENCOUNTER — Ambulatory Visit: Admitting: Podiatry

## 2024-05-19 ENCOUNTER — Ambulatory Visit (INDEPENDENT_AMBULATORY_CARE_PROVIDER_SITE_OTHER)

## 2024-05-19 ENCOUNTER — Encounter: Payer: Self-pay | Admitting: Podiatry

## 2024-05-19 DIAGNOSIS — M2011 Hallux valgus (acquired), right foot: Secondary | ICD-10-CM

## 2024-05-19 DIAGNOSIS — M21621 Bunionette of right foot: Secondary | ICD-10-CM

## 2024-05-19 DIAGNOSIS — M21611 Bunion of right foot: Secondary | ICD-10-CM | POA: Diagnosis not present

## 2024-05-19 DIAGNOSIS — M2041 Other hammer toe(s) (acquired), right foot: Secondary | ICD-10-CM

## 2024-05-19 NOTE — Progress Notes (Unsigned)
 Chief Complaint  Patient presents with   Foot Pain    RM 20 surgery consult right foot. (*I only need a sesamoid axial xray today of the right foot**)   HPI: 53 y.o. female presents today for surgical consult.  She states that she tried the toe spacers over the past month and shoe modifications and these were unsuccessful in alleviating her symptoms.  She would like to proceed with surgical reduction of the right bunion, the right second hammertoe, the right fifth adductovarus toe, and the painful right tailor's bunion.  Her past medical history is pertinent for diabetes, irritable bowel syndrome, restless leg syndrome, sleep apnea, fibromyalgia, ANA positive test and GERD.  She takes Plaquenil  and NSAIDs.  Past Medical History:  Diagnosis Date   ADD (attention deficit disorder with hyperactivity)    Allergic rhinitis    Anxiety    Arthritis    Depression    Diabetes mellitus without complication (HCC)    Dysmenorrhea    Fibromyalgia    GERD (gastroesophageal reflux disease)    H. pylori infection    per patient    HLD (hyperlipidemia)    HTN (hypertension)    Insomnia    Irritable bowel syndrome    Restless leg syndrome    Sleep apnea    Past Surgical History:  Procedure Laterality Date   BUNIONECTOMY Left 2008   DILATION AND CURETTAGE OF UTERUS  2004   EAR CYST EXCISION Left 1983   ESOPHAGEAL MANOMETRY N/A 01/02/2023   Procedure: ESOPHAGEAL MANOMETRY (EM);  Surgeon: Albertina Hugger, MD;  Location: [facility];  Service: Gastroenterology;  Laterality: N/A;   KNEE ARTHROSCOPY Right    with knee cap adjustment   PH IMPEDANCE STUDY N/A 01/02/2023   Procedure: PH IMPEDANCE STUDY;  Surgeon: Albertina Hugger, MD;  Location: [facility];  Service: Gastroenterology;  Laterality: N/A;   UPPER GASTROINTESTINAL ENDOSCOPY  06/29/2020   Allergies  Allergen Reactions   Lisinopril     Cough    Methylphenidate Other (See Comments)    Other reaction(s): palpitations    Prednisone     Other reaction(s): rapid heartbeat   Review of Systems  Musculoskeletal:  Positive for joint pain.     Physical Exam: There are palpable pedal pulses.  No open lesions are noted.  There is a bony prominence on the medial aspect of the first metatarsal head with lateral angulation of the hallux which is manually reducible.  There is contracture of the right second toe.  There is pain on palpation of the second metatarsal head plantarly.  There is no associated callus.  There is adduction and varus rotation of the right fifth toe.  There is a bony prominence on the lateral aspect of the fifth metatarsal head with pain on palpation consistent with tailor's bunion.  Epicritic sensation and protective sensation are intact right foot.  Radiographic Exam (right sesamoid axial view was obtained to add to her previous x-rays for surgical planning):  Normal osseous mineralization.  Slight plantar prominence of the second metatarsal head.  No foreign body seen within the plantar soft tissues.  Assessment/Plan of Care: 1. Hav (hallux abducto valgus), right   2. Bunion, right foot   3. Hammertoe of right foot   4. Tailor's bunion of right foot     DG FOOT COMPLETE RIGHT  Discussed clinical and radiographic findings with patient.  Since conservative measures have been unsuccessful for the patient, we agreed to proceed with surgical correction of  the deformities right foot.  Will plan for Austin/Akin bunionectomy with Arthrex internal fixation, right 2nd hammertoe correction with possible K-wire fixation, right 2nd metatarsal condylectomy, Tailor's bunionectomy and right 5th toe derotational arthroplasty.  This will be outpatient surgery at [facility] under IV sedation with local anesthesia.  She'd like it done as soon as possible.    Requested patient speak to her rheumatologist and PCP regarding when she should D/C the Plaquenil  and NSAIDs prior to surgery and arrange for perioperative medication  management.    Discussed risks, benefits, and possible complications of having these procedures performed.  Discussed possible sequela if patient does not proceed with surgical intervention.  All questions were answered today.  Verbal and written consent obtained and M.A. provided patient with surgical packet to take home with her.    F/u for surgery.  Patient would like it performed soon.  She has no upcoming vacations or travel plans that we would need to schedule around.  Joe Murders, DPM, FACFAS [facility]     [address] [place], [place] [address]                Office [phone]  Fax [phone]

## 2024-05-21 ENCOUNTER — Telehealth: Payer: Self-pay | Admitting: Podiatry

## 2024-05-21 NOTE — Telephone Encounter (Signed)
 Received surgical consent form.  Left message for pt to call me directly to get her surgery scheduled.

## 2024-05-22 NOTE — Telephone Encounter (Signed)
 Pt called and is scheduled for surgery on 6/24 at [facility]. She is aware that she needs to stop her ozempic 1 wk prior to surgery. She will call the office if she needs to change date.

## 2024-05-27 ENCOUNTER — Other Ambulatory Visit: Payer: Self-pay | Admitting: Gastroenterology

## 2024-05-27 ENCOUNTER — Other Ambulatory Visit: Payer: Self-pay | Admitting: Surgical

## 2024-06-03 ENCOUNTER — Telehealth: Payer: Self-pay

## 2024-06-03 NOTE — Telephone Encounter (Signed)
 DOS 06/09/2024  PER [employer] SITE NO AUTH REQUIRED FOR CPT 28122.  Approved Authorization #[id number] - Tracking [id number]   Primary diagnosis M20.11 - Hallux valgus (acquired), right foot  Secondary diagnosis M21.611, M20.41, M21.621, M21.961  Dates of service 06/09/2024 - 07/09/2024  28296 1 unit approved  Right (RT) [id number] 1 unit approved  Right (RT 28110 1 unit approved  Right (RT) [id number] 2 units approved  Right (RT)

## 2024-06-09 ENCOUNTER — Other Ambulatory Visit: Payer: Self-pay | Admitting: Podiatry

## 2024-06-09 DIAGNOSIS — M21621 Bunionette of right foot: Secondary | ICD-10-CM | POA: Diagnosis not present

## 2024-06-09 DIAGNOSIS — M2011 Hallux valgus (acquired), right foot: Secondary | ICD-10-CM | POA: Diagnosis not present

## 2024-06-09 DIAGNOSIS — M21611 Bunion of right foot: Secondary | ICD-10-CM | POA: Diagnosis not present

## 2024-06-09 DIAGNOSIS — M24574 Contracture, right foot: Secondary | ICD-10-CM | POA: Diagnosis not present

## 2024-06-09 DIAGNOSIS — M2041 Other hammer toe(s) (acquired), right foot: Secondary | ICD-10-CM | POA: Diagnosis not present

## 2024-06-09 HISTORY — PX: HAMMER TOE SURGERY: SHX385

## 2024-06-09 HISTORY — PX: OTHER SURGICAL HISTORY: SHX169

## 2024-06-09 HISTORY — PX: METATARSAL OSTEOTOMY WITH BUNIONECTOMY: SHX5662

## 2024-06-09 MED ORDER — AMOXICILLIN 500 MG PO CAPS: 500.0000 mg | ORAL_CAPSULE | Freq: Two times a day (BID) | ORAL | 0 refills | Status: AC

## 2024-06-09 MED ORDER — HYDROCODONE-ACETAMINOPHEN 5-325 MG PO TABS
ORAL_TABLET | ORAL | 0 refills | Status: DC
Start: 2024-06-09 — End: 2024-06-24

## 2024-06-09 NOTE — Progress Notes (Signed)
 Post op meds sent to patient pharmacy on file

## 2024-06-11 ENCOUNTER — Other Ambulatory Visit: Payer: Self-pay | Admitting: Gastroenterology

## 2024-06-15 ENCOUNTER — Other Ambulatory Visit: Payer: Self-pay

## 2024-06-15 ENCOUNTER — Emergency Department (HOSPITAL_COMMUNITY)
Admission: EM | Admit: 2024-06-15 | Discharge: 2024-06-16 | Disposition: A | Discharge status: Home / Self Care | Attending: Emergency Medicine | Admitting: Emergency Medicine

## 2024-06-15 ENCOUNTER — Encounter (HOSPITAL_COMMUNITY): Payer: Self-pay | Admitting: Emergency Medicine

## 2024-06-15 DIAGNOSIS — Z823 Family history of stroke: Secondary | ICD-10-CM | POA: Diagnosis not present

## 2024-06-15 DIAGNOSIS — E860 Dehydration: Secondary | ICD-10-CM | POA: Diagnosis present

## 2024-06-15 DIAGNOSIS — Z7985 Long-term (current) use of injectable non-insulin antidiabetic drugs: Secondary | ICD-10-CM | POA: Diagnosis not present

## 2024-06-15 DIAGNOSIS — Z79899 Other long term (current) drug therapy: Secondary | ICD-10-CM | POA: Diagnosis not present

## 2024-06-15 DIAGNOSIS — R112 Nausea with vomiting, unspecified: Secondary | ICD-10-CM | POA: Diagnosis not present

## 2024-06-15 DIAGNOSIS — G2581 Restless legs syndrome: Secondary | ICD-10-CM | POA: Diagnosis present

## 2024-06-15 DIAGNOSIS — R1084 Generalized abdominal pain: Secondary | ICD-10-CM | POA: Diagnosis not present

## 2024-06-15 DIAGNOSIS — E119 Type 2 diabetes mellitus without complications: Secondary | ICD-10-CM | POA: Diagnosis present

## 2024-06-15 DIAGNOSIS — K5790 Diverticulosis of intestine, part unspecified, without perforation or abscess without bleeding: Secondary | ICD-10-CM | POA: Diagnosis not present

## 2024-06-15 DIAGNOSIS — Z8719 Personal history of other diseases of the digestive system: Secondary | ICD-10-CM | POA: Diagnosis not present

## 2024-06-15 DIAGNOSIS — I10 Essential (primary) hypertension: Secondary | ICD-10-CM | POA: Diagnosis present

## 2024-06-15 DIAGNOSIS — K5730 Diverticulosis of large intestine without perforation or abscess without bleeding: Secondary | ICD-10-CM | POA: Diagnosis present

## 2024-06-15 DIAGNOSIS — Z7951 Long term (current) use of inhaled steroids: Secondary | ICD-10-CM | POA: Diagnosis not present

## 2024-06-15 DIAGNOSIS — Z9889 Other specified postprocedural states: Secondary | ICD-10-CM | POA: Diagnosis not present

## 2024-06-15 DIAGNOSIS — R1110 Vomiting, unspecified: Secondary | ICD-10-CM | POA: Insufficient documentation

## 2024-06-15 DIAGNOSIS — R109 Unspecified abdominal pain: Secondary | ICD-10-CM | POA: Diagnosis not present

## 2024-06-15 DIAGNOSIS — R000 Tachycardia, unspecified: Secondary | ICD-10-CM | POA: Diagnosis not present

## 2024-06-15 DIAGNOSIS — K219 Gastro-esophageal reflux disease without esophagitis: Secondary | ICD-10-CM | POA: Diagnosis present

## 2024-06-15 DIAGNOSIS — R1013 Epigastric pain: Secondary | ICD-10-CM | POA: Diagnosis present

## 2024-06-15 DIAGNOSIS — E872 Acidosis, unspecified: Secondary | ICD-10-CM | POA: Diagnosis present

## 2024-06-15 DIAGNOSIS — D509 Iron deficiency anemia, unspecified: Secondary | ICD-10-CM | POA: Diagnosis present

## 2024-06-15 DIAGNOSIS — K760 Fatty (change of) liver, not elsewhere classified: Secondary | ICD-10-CM | POA: Diagnosis present

## 2024-06-15 DIAGNOSIS — Z803 Family history of malignant neoplasm of breast: Secondary | ICD-10-CM | POA: Diagnosis not present

## 2024-06-15 DIAGNOSIS — M329 Systemic lupus erythematosus, unspecified: Secondary | ICD-10-CM | POA: Diagnosis present

## 2024-06-15 DIAGNOSIS — M21611 Bunion of right foot: Secondary | ICD-10-CM | POA: Diagnosis not present

## 2024-06-15 DIAGNOSIS — Z888 Allergy status to other drugs, medicaments and biological substances status: Secondary | ICD-10-CM | POA: Diagnosis not present

## 2024-06-15 DIAGNOSIS — Z833 Family history of diabetes mellitus: Secondary | ICD-10-CM | POA: Diagnosis not present

## 2024-06-15 DIAGNOSIS — M797 Fibromyalgia: Secondary | ICD-10-CM | POA: Diagnosis present

## 2024-06-15 DIAGNOSIS — K5900 Constipation, unspecified: Secondary | ICD-10-CM | POA: Diagnosis present

## 2024-06-15 DIAGNOSIS — F32A Depression, unspecified: Secondary | ICD-10-CM | POA: Diagnosis present

## 2024-06-15 DIAGNOSIS — R079 Chest pain, unspecified: Secondary | ICD-10-CM | POA: Diagnosis not present

## 2024-06-15 DIAGNOSIS — R911 Solitary pulmonary nodule: Secondary | ICD-10-CM | POA: Diagnosis not present

## 2024-06-15 DIAGNOSIS — E785 Hyperlipidemia, unspecified: Secondary | ICD-10-CM | POA: Diagnosis present

## 2024-06-15 DIAGNOSIS — Z87891 Personal history of nicotine dependence: Secondary | ICD-10-CM | POA: Diagnosis not present

## 2024-06-15 DIAGNOSIS — Z8249 Family history of ischemic heart disease and other diseases of the circulatory system: Secondary | ICD-10-CM | POA: Diagnosis not present

## 2024-06-15 DIAGNOSIS — A084 Viral intestinal infection, unspecified: Secondary | ICD-10-CM | POA: Diagnosis present

## 2024-06-15 NOTE — ED Triage Notes (Addendum)
 BIB EMS from home.  N/V and constipation with abdominal pain x 2-3 days.  VSS 158/100, 80 HR, 97% on RA.  CBG 124.   Pt is on amoxicillin  post surgery for right foot but has not been able to keep it down.   4 mg zofran  given en route.

## 2024-06-16 ENCOUNTER — Ambulatory Visit (INDEPENDENT_AMBULATORY_CARE_PROVIDER_SITE_OTHER): Admitting: Podiatry

## 2024-06-16 ENCOUNTER — Emergency Department (HOSPITAL_COMMUNITY)

## 2024-06-16 ENCOUNTER — Telehealth: Payer: Self-pay | Admitting: Podiatry

## 2024-06-16 ENCOUNTER — Encounter: Payer: Self-pay | Admitting: Podiatry

## 2024-06-16 ENCOUNTER — Ambulatory Visit (INDEPENDENT_AMBULATORY_CARE_PROVIDER_SITE_OTHER)

## 2024-06-16 VITALS — BP 161/92 | HR 114 | Temp 97.9°F

## 2024-06-16 DIAGNOSIS — M21611 Bunion of right foot: Secondary | ICD-10-CM | POA: Diagnosis not present

## 2024-06-16 DIAGNOSIS — M21619 Bunion of unspecified foot: Secondary | ICD-10-CM

## 2024-06-16 DIAGNOSIS — M21621 Bunionette of right foot: Secondary | ICD-10-CM

## 2024-06-16 DIAGNOSIS — M2041 Other hammer toe(s) (acquired), right foot: Secondary | ICD-10-CM

## 2024-06-16 DIAGNOSIS — K760 Fatty (change of) liver, not elsewhere classified: Secondary | ICD-10-CM | POA: Diagnosis not present

## 2024-06-16 DIAGNOSIS — R109 Unspecified abdominal pain: Secondary | ICD-10-CM | POA: Diagnosis not present

## 2024-06-16 DIAGNOSIS — R911 Solitary pulmonary nodule: Secondary | ICD-10-CM | POA: Diagnosis not present

## 2024-06-16 DIAGNOSIS — Z9889 Other specified postprocedural states: Secondary | ICD-10-CM

## 2024-06-16 DIAGNOSIS — K5790 Diverticulosis of intestine, part unspecified, without perforation or abscess without bleeding: Secondary | ICD-10-CM | POA: Diagnosis not present

## 2024-06-16 DIAGNOSIS — R079 Chest pain, unspecified: Secondary | ICD-10-CM | POA: Diagnosis not present

## 2024-06-16 LAB — COMPREHENSIVE METABOLIC PANEL WITH GFR
ALT: 34 U/L (ref 0–44)
AST: 39 U/L (ref 15–41)
Albumin: 4.5 g/dL (ref 3.5–5.0)
Alkaline Phosphatase: 94 U/L (ref 38–126)
Anion gap: 20 — ABNORMAL HIGH (ref 5–15)
BUN: 11 mg/dL (ref 6–20)
CO2: 17 mmol/L — ABNORMAL LOW (ref 22–32)
Calcium: 9.4 mg/dL (ref 8.9–10.3)
Chloride: 104 mmol/L (ref 98–111)
Creatinine, Ser: 0.93 mg/dL (ref 0.44–1.00)
GFR, Estimated: >60 mL/min (ref >60)
Glucose, Bld: 203 mg/dL — ABNORMAL HIGH (ref 70–99)
Potassium: 3.5 mmol/L (ref 3.5–5.1)
Sodium: 141 mmol/L (ref 135–145)
Total Bilirubin: 1 mg/dL (ref 0.0–1.2)
Total Protein: 8.6 g/dL — ABNORMAL HIGH (ref 6.5–8.1)

## 2024-06-16 LAB — BLOOD GAS, VENOUS
Acid-Base Excess: 3.5 mmol/L — ABNORMAL HIGH (ref 0.0–2.0)
Bicarbonate: 24 mmol/L (ref 20.0–28.0)
O2 Saturation: 55.8 %
Patient temperature: 37
pCO2, Ven: 25 mmHg — ABNORMAL LOW (ref 44–60)
pH, Ven: 7.59 — ABNORMAL HIGH (ref 7.25–7.43)
pO2, Ven: <31 mmHg — CL (ref 32–45)

## 2024-06-16 LAB — URINALYSIS, ROUTINE W REFLEX MICROSCOPIC
Bilirubin Urine: NEGATIVE
Glucose, UA: NEGATIVE mg/dL
Hgb urine dipstick: NEGATIVE
Ketones, ur: 80 mg/dL — AB
Leukocytes,Ua: NEGATIVE
Nitrite: NEGATIVE
Protein, ur: NEGATIVE mg/dL
Specific Gravity, Urine: >1.046 — ABNORMAL HIGH (ref 1.005–1.030)
pH: 6 (ref 5.0–8.0)

## 2024-06-16 LAB — CBC
HCT: 45.5 % (ref 36.0–46.0)
Hemoglobin: 15 g/dL (ref 12.0–15.0)
MCH: 27.2 pg (ref 26.0–34.0)
MCHC: 33 g/dL (ref 30.0–36.0)
MCV: 82.4 fL (ref 80.0–100.0)
Platelets: 436 10*3/uL — ABNORMAL HIGH (ref 150–400)
RBC: 5.52 MIL/uL — ABNORMAL HIGH (ref 3.87–5.11)
RDW: 15 % (ref 11.5–15.5)
WBC: 9.2 10*3/uL (ref 4.0–10.5)
nRBC: 0 % (ref 0.0–0.2)

## 2024-06-16 LAB — BASIC METABOLIC PANEL WITH GFR
Anion gap: 12 (ref 5–15)
BUN: 10 mg/dL (ref 6–20)
CO2: 22 mmol/L (ref 22–32)
Calcium: 8.4 mg/dL — ABNORMAL LOW (ref 8.9–10.3)
Chloride: 103 mmol/L (ref 98–111)
Creatinine, Ser: 0.85 mg/dL (ref 0.44–1.00)
GFR, Estimated: >60 mL/min (ref >60)
Glucose, Bld: 111 mg/dL — ABNORMAL HIGH (ref 70–99)
Potassium: 3 mmol/L — ABNORMAL LOW (ref 3.5–5.1)
Sodium: 137 mmol/L (ref 135–145)

## 2024-06-16 LAB — I-STAT CG4 LACTIC ACID, ED
Lactic Acid, Venous: 3.5 mmol/L (ref 0.5–1.9)
Lactic Acid, Venous: 2.9 mmol/L (ref 0.5–1.9)

## 2024-06-16 LAB — LIPASE, BLOOD: Lipase: 31 U/L (ref 11–51)

## 2024-06-16 LAB — BETA-HYDROXYBUTYRIC ACID: Beta-Hydroxybutyric Acid: 1.65 mmol/L — ABNORMAL HIGH (ref 0.05–0.27)

## 2024-06-16 MED ORDER — ONDANSETRON HCL 4 MG/2ML IJ SOLN
4.0000 mg | Freq: Once | INTRAMUSCULAR | Status: AC
  Administered 2024-06-16: 4 mg via INTRAVENOUS
  Filled 2024-06-16: qty 2

## 2024-06-16 MED ORDER — IOHEXOL 350 MG/ML SOLN
100.0000 mL | Freq: Once | INTRAVENOUS | Status: AC | PRN
Start: 2024-06-16 — End: 2024-06-16
  Administered 2024-06-16: 100 mL via INTRAVENOUS

## 2024-06-16 MED ORDER — MORPHINE SULFATE (PF) 4 MG/ML IV SOLN
6.0000 mg | Freq: Once | INTRAVENOUS | Status: AC
  Administered 2024-06-16: 6 mg via INTRAVENOUS
  Filled 2024-06-16: qty 2

## 2024-06-16 MED ORDER — ONDANSETRON HCL 4 MG PO TABS: 4.0000 mg | ORAL_TABLET | Freq: Four times a day (QID) | ORAL | 0 refills | Status: DC

## 2024-06-16 MED ORDER — LACTATED RINGERS IV BOLUS
2000.0000 mL | Freq: Once | INTRAVENOUS | Status: AC
  Administered 2024-06-16: 2000 mL via INTRAVENOUS

## 2024-06-16 MED ORDER — LACTATED RINGERS IV BOLUS: 1000.0000 mL | Freq: Once | INTRAVENOUS | Status: DC

## 2024-06-16 MED ORDER — METOCLOPRAMIDE HCL 5 MG/ML IJ SOLN
10.0000 mg | Freq: Once | INTRAMUSCULAR | Status: AC
  Administered 2024-06-16: 10 mg via INTRAVENOUS
  Filled 2024-06-16: qty 2

## 2024-06-16 NOTE — ED Notes (Signed)
 Pt requested to use the restroom used wheelchair to assist pt

## 2024-06-16 NOTE — Telephone Encounter (Signed)
 Pt called and left message today at 804am stating she has just left the hospital and still feeling a little sick to her stomach and has a post op appt at 945am today.   Checked chart and this would be her 1st post op. Called pt and told her the only option would be to come in a little later today at 1115am as Dr Awanda is not in the office this afternoon.  Pt did ask about coming tomorrow and I told her Dr Awanda is not in [employer] office tomorrow she is in [place] office. We moved appt to 1115am today. I did not want to push post op 1 to next week.

## 2024-06-16 NOTE — ED Provider Notes (Signed)
 [place] EMERGENCY DEPARTMENT AT [facility] Provider Note   CSN: [id number] Arrival date & time: 06/15/24  2345     History Chief Complaint  Patient presents with   Abdominal Pain    HPI [name] is a 53 y.o. female presenting for chief complaint of abdominal pain for days. States she is having epigatric pain  12 episodes of vomitting.  HX DM, HTN/HLD.  Recent foot surgery/bunion. On Amoxil  since the surgery  Patient's recorded medical, surgical, social, medication list and allergies were reviewed in the Snapshot window as part of the initial history.   Review of Systems   Review of Systems  Constitutional:  Negative for chills and fever.  HENT:  Negative for ear pain and sore throat.   Eyes:  Negative for pain and visual disturbance.  Respiratory:  Negative for cough and shortness of breath.   Cardiovascular:  Negative for chest pain and palpitations.  Gastrointestinal:  Negative for abdominal pain and vomiting.  Genitourinary:  Negative for dysuria and hematuria.  Musculoskeletal:  Negative for arthralgias and back pain.  Skin:  Negative for color change and rash.  Neurological:  Negative for seizures and syncope.  All other systems reviewed and are negative.   Physical Exam Updated Vital Signs BP (!) 160/85   Pulse 96   Temp 98.4 F (36.9 C) (Oral)   Resp 17   SpO2 98%  Physical Exam Vitals and nursing note reviewed.  Constitutional:      General: She is not in acute distress.    Appearance: She is well-developed.  HENT:     Head: Normocephalic and atraumatic.   Eyes:     Conjunctiva/sclera: Conjunctivae normal.    Cardiovascular:     Rate and Rhythm: Normal rate and regular rhythm.     Heart sounds: No murmur heard. Pulmonary:     Effort: Pulmonary effort is normal. No respiratory distress.     Breath sounds: Normal breath sounds.  Abdominal:     General: There is no distension.     Palpations: Abdomen is soft.      Tenderness: There is abdominal tenderness in the epigastric area and periumbilical area. There is no right CVA tenderness or left CVA tenderness.   Musculoskeletal:        General: No swelling or tenderness. Normal range of motion.     Cervical back: Neck supple.   Skin:    General: Skin is warm and dry.   Neurological:     General: No focal deficit present.     Mental Status: She is alert and oriented to person, place, and time. Mental status is at baseline.     Cranial Nerves: No cranial nerve deficit.      ED Course/ Medical Decision Making/ A&P    Procedures Procedures   Medications Ordered in ED Medications  metoCLOPramide  (REGLAN ) injection 10 mg (10 mg Intravenous Given 06/16/24 0211)  morphine  (PF) 4 MG/ML injection 6 mg (6 mg Intravenous Given 06/16/24 0211)  lactated ringers bolus 2,000 mL (2,000 mLs Intravenous Bolus 06/16/24 0210)  iohexol  (OMNIPAQUE ) 350 MG/ML injection 100 mL (100 mLs Intravenous Contrast Given 06/16/24 0133)  ondansetron  (ZOFRAN ) injection 4 mg (4 mg Intravenous Given 06/16/24 0541)    Medical Decision Making:   [name] is a 53 y.o. female who presented to the ED today with abdominal pain, detailed above.    Patient placed on continuous vitals and telemetry monitoring while in ED which was reviewed periodically.  Complete initial physical exam performed, notably the patient  was hemodynamically stable no acute distress.     Reviewed and confirmed nursing documentation for past medical history, family history, social history.    Initial Assessment:   With the patient's presentation of abdominal pain, most likely diagnosis is nonspecific etiology. Other diagnoses were considered including (but not limited to) gastroenteritis, colitis, small bowel obstruction, appendicitis, cholecystitis, pancreatitis, nephrolithiasis, UTI, pyleonephritis. These are considered less likely due to history of present illness and physical exam findings.   This  is most consistent with an acute life/limb threatening illness complicated by underlying chronic conditions.   Initial Plan:  CBC/CMP to evaluate for underlying infectious/metabolic etiology for patient's abdominal pain  Lipase to evaluate for pancreatitis  EKG to evaluate for cardiac source of pain  CTAB/Pelvis with contrast to evaluate for structural/surgical etiology of patients' severe abdominal pain.  Included CTA PE study due to degree of tachycardia on arrival. Urinalysis and repeat physical assessment to evaluate for UTI/Pyelonpehritis  Empiric management of symptoms with escalating pain control and antiemetics as needed.   Initial Study Results:   Laboratory  All laboratory results reviewed without evidence of clinically relevant pathology.     EKG EKG was reviewed independently. Rate, rhythm, axis, intervals all examined and without medically relevant abnormality. ST segments without concerns for elevations.    Radiology All images reviewed independently. Agree with radiology report at this time.   CT ABDOMEN PELVIS W CONTRAST Result Date: 06/16/2024 CLINICAL DATA:  Abdominal pain EXAM: CT ABDOMEN AND PELVIS WITH CONTRAST TECHNIQUE: Multidetector CT imaging of the abdomen and pelvis was performed using the standard protocol following bolus administration of intravenous contrast. RADIATION DOSE REDUCTION: This exam was performed according to the departmental dose-optimization program which includes automated exposure control, adjustment of the mA and/or kV according to patient size and/or use of iterative reconstruction technique. CONTRAST:  OMNIPAQUE  IOHEXOL  350 MG/ML SOLN COMPARISON:  05/26/2023 FINDINGS: Hepatobiliary: Fatty infiltration of the liver is noted. The gallbladder is within normal limits. Pancreas: Unremarkable. No pancreatic ductal dilatation or surrounding inflammatory changes. Spleen: Normal in size without focal abnormality. Adrenals/Urinary Tract: Adrenal glands  are unremarkable. Kidneys demonstrate a normal enhancement pattern bilaterally. No renal calculi are seen. No obstructive changes are noted. The bladder is decompressed. Stomach/Bowel: No obstructive or inflammatory changes of the colon are seen. Mild diverticular change is noted without diverticulitis. The appendix is air-filled and limits. Small bowel and stomach are unremarkable. Vascular/Lymphatic: No significant vascular findings are present. No enlarged abdominal or pelvic lymph nodes. Reproductive: Uterus and bilateral adnexa are unremarkable. Other: No abdominal wall hernia or abnormality. No abdominopelvic ascites. Musculoskeletal: No acute or significant osseous findings. IMPRESSION: Fatty liver. Diverticulosis without diverticulitis. Electronically Signed   By: Oneil Devonshire M.D.   On: 06/16/2024 02:11   CT Angio Chest Pulmonary Embolism (PE) W or WO Contrast Result Date: 06/16/2024 CLINICAL DATA:  Chest and abdominal pain EXAM: CT ANGIOGRAPHY CHEST WITH CONTRAST TECHNIQUE: Multidetector CT imaging of the chest was performed using the standard protocol during bolus administration of intravenous contrast. Multiplanar CT image reconstructions and MIPs were obtained to evaluate the vascular anatomy. RADIATION DOSE REDUCTION: This exam was performed according to the departmental dose-optimization program which includes automated exposure control, adjustment of the mA and/or kV according to patient size and/or use of iterative reconstruction technique. CONTRAST:  OMNIPAQUE  IOHEXOL  350 MG/ML SOLN COMPARISON:  None Available. FINDINGS: Cardiovascular: Thoracic aorta its branches are within normal limits. No cardiac enlargement is  seen. Pulmonary artery shows a normal branching pattern bilaterally. No filling defect to suggest pulmonary embolism is noted. Mediastinum/Nodes: Thoracic inlet is within normal limits. No hilar or mediastinal adenopathy is noted. The esophagus as visualized is within normal  limits. Lungs/Pleura: Lungs are well aerated bilaterally. No focal infiltrate or effusion is noted. Tiny subpleural left upper lobe nodule is noted measuring 2-3 mm. No follow-up is recommended. Musculoskeletal: No acute bony abnormality noted. Review of the MIP images confirms the above findings. IMPRESSION: No evidence of pulmonary emboli. Tiny subpleural left upper lobe nodule. No follow-up is recommended. Electronically Signed   By: Oneil Devonshire M.D.   On: 06/16/2024 02:09   DG Foot Complete Right Result Date: 05/19/2024 Please see detailed radiograph report in office note.    Final Reassessment and Plan:   All the patient's objective findings were grossly reassuring.  Initial BMP showed an anion gap acidosis likely combined starvation ketosis given multiple days of poor p.o. intake.  Fortunately she responded appropriately to IV fluids and serial BMP showed resolution.  Lactic and ketoacids both downtrending.  Anion gap closed.  Not consistent with DKA. After antiemetic administration, patient is now tolerating p.o. intake. Likely nonspecific nausea vomiting/cyclic vomiting syndrome.  Now improving.  Discussed admitting to the hospital however patient feels comfortable with outpatient care and management with her current presentation.  Given gross improvement in syndrome, patient discharged with further antiemetic support at home with strict return precautions reinforced regarding interval worsening.      Clinical Impression:  1. Generalized abdominal pain   2. Dehydration      Discharge   Final Clinical Impression(s) / ED Diagnoses Final diagnoses:  Generalized abdominal pain  Dehydration    Rx / DC Orders ED Discharge Orders     None         Jerral Meth, MD 06/16/24 [phone]

## 2024-06-17 ENCOUNTER — Encounter (HOSPITAL_COMMUNITY): Payer: Self-pay | Admitting: *Deleted

## 2024-06-17 ENCOUNTER — Other Ambulatory Visit: Payer: Self-pay

## 2024-06-17 ENCOUNTER — Inpatient Hospital Stay (HOSPITAL_COMMUNITY)
Admission: EM | Admit: 2024-06-17 | Discharge: 2024-06-19 | DRG: 392 | Disposition: A | Discharge status: Home Health | Attending: Internal Medicine | Admitting: Internal Medicine

## 2024-06-17 DIAGNOSIS — Z888 Allergy status to other drugs, medicaments and biological substances status: Secondary | ICD-10-CM

## 2024-06-17 DIAGNOSIS — I10 Essential (primary) hypertension: Secondary | ICD-10-CM | POA: Diagnosis not present

## 2024-06-17 DIAGNOSIS — Z803 Family history of malignant neoplasm of breast: Secondary | ICD-10-CM

## 2024-06-17 DIAGNOSIS — M797 Fibromyalgia: Secondary | ICD-10-CM | POA: Diagnosis not present

## 2024-06-17 DIAGNOSIS — Z87891 Personal history of nicotine dependence: Secondary | ICD-10-CM

## 2024-06-17 DIAGNOSIS — Z8719 Personal history of other diseases of the digestive system: Secondary | ICD-10-CM | POA: Diagnosis not present

## 2024-06-17 DIAGNOSIS — K760 Fatty (change of) liver, not elsewhere classified: Secondary | ICD-10-CM | POA: Diagnosis present

## 2024-06-17 DIAGNOSIS — F32A Depression, unspecified: Secondary | ICD-10-CM | POA: Diagnosis present

## 2024-06-17 DIAGNOSIS — R000 Tachycardia, unspecified: Secondary | ICD-10-CM | POA: Diagnosis not present

## 2024-06-17 DIAGNOSIS — Z9889 Other specified postprocedural states: Secondary | ICD-10-CM | POA: Diagnosis not present

## 2024-06-17 DIAGNOSIS — R112 Nausea with vomiting, unspecified: Secondary | ICD-10-CM | POA: Diagnosis not present

## 2024-06-17 DIAGNOSIS — R1084 Generalized abdominal pain: Secondary | ICD-10-CM | POA: Diagnosis not present

## 2024-06-17 DIAGNOSIS — M1990 Unspecified osteoarthritis, unspecified site: Secondary | ICD-10-CM | POA: Diagnosis present

## 2024-06-17 DIAGNOSIS — F909 Attention-deficit hyperactivity disorder, unspecified type: Secondary | ICD-10-CM | POA: Diagnosis present

## 2024-06-17 DIAGNOSIS — G2581 Restless legs syndrome: Secondary | ICD-10-CM | POA: Diagnosis present

## 2024-06-17 DIAGNOSIS — E119 Type 2 diabetes mellitus without complications: Secondary | ICD-10-CM | POA: Diagnosis present

## 2024-06-17 DIAGNOSIS — E860 Dehydration: Secondary | ICD-10-CM | POA: Diagnosis not present

## 2024-06-17 DIAGNOSIS — K219 Gastro-esophageal reflux disease without esophagitis: Secondary | ICD-10-CM | POA: Diagnosis present

## 2024-06-17 DIAGNOSIS — A084 Viral intestinal infection, unspecified: Principal | ICD-10-CM | POA: Diagnosis present

## 2024-06-17 DIAGNOSIS — Z823 Family history of stroke: Secondary | ICD-10-CM

## 2024-06-17 DIAGNOSIS — K5900 Constipation, unspecified: Secondary | ICD-10-CM | POA: Diagnosis not present

## 2024-06-17 DIAGNOSIS — Z7951 Long term (current) use of inhaled steroids: Secondary | ICD-10-CM

## 2024-06-17 DIAGNOSIS — E785 Hyperlipidemia, unspecified: Secondary | ICD-10-CM

## 2024-06-17 DIAGNOSIS — Z79899 Other long term (current) drug therapy: Secondary | ICD-10-CM

## 2024-06-17 DIAGNOSIS — E872 Acidosis, unspecified: Secondary | ICD-10-CM | POA: Diagnosis present

## 2024-06-17 DIAGNOSIS — Z833 Family history of diabetes mellitus: Secondary | ICD-10-CM

## 2024-06-17 DIAGNOSIS — K5730 Diverticulosis of large intestine without perforation or abscess without bleeding: Secondary | ICD-10-CM | POA: Diagnosis present

## 2024-06-17 DIAGNOSIS — Z8249 Family history of ischemic heart disease and other diseases of the circulatory system: Secondary | ICD-10-CM

## 2024-06-17 DIAGNOSIS — D509 Iron deficiency anemia, unspecified: Secondary | ICD-10-CM | POA: Diagnosis present

## 2024-06-17 DIAGNOSIS — Z7985 Long-term (current) use of injectable non-insulin antidiabetic drugs: Secondary | ICD-10-CM

## 2024-06-17 DIAGNOSIS — M329 Systemic lupus erythematosus, unspecified: Secondary | ICD-10-CM | POA: Diagnosis present

## 2024-06-17 LAB — BLOOD GAS, VENOUS
Acid-Base Excess: 2.3 mmol/L — ABNORMAL HIGH (ref 0.0–2.0)
Bicarbonate: 27.3 mmol/L (ref 20.0–28.0)
O2 Saturation: 95.4 %
Patient temperature: 37
pCO2, Ven: 43 mmHg — ABNORMAL LOW (ref 44–60)
pH, Ven: 7.41 (ref 7.25–7.43)
pO2, Ven: 69 mmHg — ABNORMAL HIGH (ref 32–45)

## 2024-06-17 LAB — COMPREHENSIVE METABOLIC PANEL WITH GFR
ALT: 35 U/L (ref 0–44)
AST: 37 U/L (ref 15–41)
Albumin: 4.3 g/dL (ref 3.5–5.0)
Alkaline Phosphatase: 84 U/L (ref 38–126)
Anion gap: 18 — ABNORMAL HIGH (ref 5–15)
BUN: 10 mg/dL (ref 6–20)
CO2: 19 mmol/L — ABNORMAL LOW (ref 22–32)
Calcium: 9.3 mg/dL (ref 8.9–10.3)
Chloride: 101 mmol/L (ref 98–111)
Creatinine, Ser: 1.04 mg/dL — ABNORMAL HIGH (ref 0.44–1.00)
GFR, Estimated: >60 mL/min (ref >60)
Glucose, Bld: 193 mg/dL — ABNORMAL HIGH (ref 70–99)
Potassium: 3.6 mmol/L (ref 3.5–5.1)
Sodium: 138 mmol/L (ref 135–145)
Total Bilirubin: 1.2 mg/dL (ref 0.0–1.2)
Total Protein: 8.5 g/dL — ABNORMAL HIGH (ref 6.5–8.1)

## 2024-06-17 LAB — URINALYSIS, ROUTINE W REFLEX MICROSCOPIC
Bacteria, UA: NONE SEEN
Bilirubin Urine: NEGATIVE
Glucose, UA: NEGATIVE mg/dL
Hgb urine dipstick: NEGATIVE
Ketones, ur: 80 mg/dL — AB
Leukocytes,Ua: NEGATIVE
Nitrite: NEGATIVE
Protein, ur: 100 mg/dL — AB
Specific Gravity, Urine: 1.023 (ref 1.005–1.030)
pH: 5 (ref 5.0–8.0)

## 2024-06-17 LAB — LIPASE, BLOOD: Lipase: 32 U/L (ref 11–51)

## 2024-06-17 LAB — CBC WITH DIFFERENTIAL/PLATELET
Abs Immature Granulocytes: 0.03 10*3/uL (ref 0.00–0.07)
Basophils Absolute: 0 10*3/uL (ref 0.0–0.1)
Basophils Relative: 0 %
Eosinophils Absolute: 0 10*3/uL (ref 0.0–0.5)
Eosinophils Relative: 0 %
HCT: 42.9 % (ref 36.0–46.0)
Hemoglobin: 13.7 g/dL (ref 12.0–15.0)
Immature Granulocytes: 0 %
Lymphocytes Relative: 45 %
Lymphs Abs: 3.5 10*3/uL (ref 0.7–4.0)
MCH: 27 pg (ref 26.0–34.0)
MCHC: 31.9 g/dL (ref 30.0–36.0)
MCV: 84.4 fL (ref 80.0–100.0)
Monocytes Absolute: 0.6 10*3/uL (ref 0.1–1.0)
Monocytes Relative: 8 %
Neutro Abs: 3.6 10*3/uL (ref 1.7–7.7)
Neutrophils Relative %: 47 %
Platelets: 395 10*3/uL (ref 150–400)
RBC: 5.08 MIL/uL (ref 3.87–5.11)
RDW: 15.6 % — ABNORMAL HIGH (ref 11.5–15.5)
WBC: 7.7 10*3/uL (ref 4.0–10.5)
nRBC: 0 % (ref 0.0–0.2)

## 2024-06-17 LAB — LACTIC ACID, PLASMA
Lactic Acid, Venous: 1.1 mmol/L (ref 0.5–1.9)
Lactic Acid, Venous: 0.8 mmol/L (ref 0.5–1.9)

## 2024-06-17 LAB — GLUCOSE, CAPILLARY
Glucose-Capillary: 146 mg/dL — ABNORMAL HIGH (ref 70–99)
Glucose-Capillary: 136 mg/dL — ABNORMAL HIGH (ref 70–99)

## 2024-06-17 LAB — BETA-HYDROXYBUTYRIC ACID: Beta-Hydroxybutyric Acid: 1.3 mmol/L — ABNORMAL HIGH (ref 0.05–0.27)

## 2024-06-17 LAB — HIV ANTIBODY (ROUTINE TESTING W REFLEX): HIV Screen 4th Generation wRfx: NONREACTIVE

## 2024-06-17 LAB — I-STAT CG4 LACTIC ACID, ED: Lactic Acid, Venous: 1.2 mmol/L (ref 0.5–1.9)

## 2024-06-17 LAB — CBG MONITORING, ED: Glucose-Capillary: 112 mg/dL — ABNORMAL HIGH (ref 70–99)

## 2024-06-17 MED ORDER — POLYETHYLENE GLYCOL 3350 17 G PO PACK
17.0000 g | PACK | Freq: Two times a day (BID) | ORAL | Status: DC
  Administered 2024-06-17 – 2024-06-18 (×2): 17 g via ORAL
  Filled 2024-06-17 (×3): qty 1

## 2024-06-17 MED ORDER — ACETAMINOPHEN 650 MG RE SUPP: 650.0000 mg | Freq: Four times a day (QID) | RECTAL | Status: DC | PRN

## 2024-06-17 MED ORDER — METOPROLOL TARTRATE 5 MG/5ML IV SOLN
5.0000 mg | Freq: Three times a day (TID) | INTRAVENOUS | Status: DC
  Administered 2024-06-17 – 2024-06-19 (×6): 5 mg via INTRAVENOUS
  Filled 2024-06-17 (×6): qty 5

## 2024-06-17 MED ORDER — METOCLOPRAMIDE HCL 5 MG/ML IJ SOLN
10.0000 mg | Freq: Once | INTRAMUSCULAR | Status: AC
  Administered 2024-06-17: 10 mg via INTRAVENOUS
  Filled 2024-06-17: qty 2

## 2024-06-17 MED ORDER — BISACODYL 10 MG RE SUPP: 10.0000 mg | Freq: Every day | RECTAL | Status: DC

## 2024-06-17 MED ORDER — LACTATED RINGERS IV BOLUS
1000.0000 mL | Freq: Once | INTRAVENOUS | Status: AC
  Administered 2024-06-17: 1000 mL via INTRAVENOUS

## 2024-06-17 MED ORDER — AMPHETAMINE-DEXTROAMPHETAMINE 10 MG PO TABS
10.0000 mg | ORAL_TABLET | Freq: Every day | ORAL | Status: DC
Start: 2024-06-18 — End: 2024-06-19
  Administered 2024-06-18 – 2024-06-19 (×2): 10 mg via ORAL
  Filled 2024-06-17 (×2): qty 1

## 2024-06-17 MED ORDER — SODIUM CHLORIDE 0.9% FLUSH
3.0000 mL | Freq: Two times a day (BID) | INTRAVENOUS | Status: DC
  Administered 2024-06-17 – 2024-06-19 (×2): 3 mL via INTRAVENOUS

## 2024-06-17 MED ORDER — LINACLOTIDE 72 MCG PO CAPS
72.0000 ug | ORAL_CAPSULE | ORAL | Status: DC
  Filled 2024-06-17: qty 1

## 2024-06-17 MED ORDER — SODIUM CHLORIDE 0.9 % IV SOLN
3.0000 g | Freq: Four times a day (QID) | INTRAVENOUS | Status: DC
  Administered 2024-06-17 – 2024-06-19 (×8): 3 g via INTRAVENOUS
  Filled 2024-06-17 (×10): qty 8

## 2024-06-17 MED ORDER — SENNOSIDES-DOCUSATE SODIUM 8.6-50 MG PO TABS
1.0000 | ORAL_TABLET | Freq: Two times a day (BID) | ORAL | Status: DC
  Administered 2024-06-17 – 2024-06-19 (×2): 1 via ORAL
  Filled 2024-06-17 (×2): qty 1

## 2024-06-17 MED ORDER — MORPHINE SULFATE (PF) 4 MG/ML IV SOLN
4.0000 mg | Freq: Once | INTRAVENOUS | Status: AC
  Administered 2024-06-17: 4 mg via INTRAVENOUS
  Filled 2024-06-17: qty 1

## 2024-06-17 MED ORDER — SENNA 8.6 MG PO TABS: 1.0000 | ORAL_TABLET | Freq: Two times a day (BID) | ORAL | Status: DC

## 2024-06-17 MED ORDER — FAMOTIDINE IN NACL 20-0.9 MG/50ML-% IV SOLN
20.0000 mg | Freq: Once | INTRAVENOUS | Status: AC
  Administered 2024-06-17: 20 mg via INTRAVENOUS
  Filled 2024-06-17: qty 50

## 2024-06-17 MED ORDER — MORPHINE SULFATE (PF) 2 MG/ML IV SOLN: 2.0000 mg | INTRAVENOUS | Status: DC | PRN

## 2024-06-17 MED ORDER — PROCHLORPERAZINE EDISYLATE 10 MG/2ML IJ SOLN: 10.0000 mg | Freq: Four times a day (QID) | INTRAMUSCULAR | Status: DC | PRN

## 2024-06-17 MED ORDER — DIPHENHYDRAMINE HCL 50 MG/ML IJ SOLN
25.0000 mg | Freq: Once | INTRAMUSCULAR | Status: AC
  Administered 2024-06-17: 25 mg via INTRAVENOUS
  Filled 2024-06-17: qty 1

## 2024-06-17 MED ORDER — FLUTICASONE FUROATE-VILANTEROL 200-25 MCG/ACT IN AEPB
1.0000 | INHALATION_SPRAY | Freq: Every day | RESPIRATORY_TRACT | Status: DC
  Administered 2024-06-18 – 2024-06-19 (×2): 1 via RESPIRATORY_TRACT
  Filled 2024-06-17: qty 28

## 2024-06-17 MED ORDER — HYDROXYCHLOROQUINE SULFATE 200 MG PO TABS
200.0000 mg | ORAL_TABLET | Freq: Every day | ORAL | Status: DC
  Administered 2024-06-18 – 2024-06-19 (×2): 200 mg via ORAL
  Filled 2024-06-17 (×2): qty 1

## 2024-06-17 MED ORDER — INSULIN ASPART 100 UNIT/ML IJ SOLN
0.0000 [IU] | INTRAMUSCULAR | Status: DC
  Administered 2024-06-17 – 2024-06-18 (×5): 1 [IU] via SUBCUTANEOUS
  Filled 2024-06-17: qty 0.09

## 2024-06-17 MED ORDER — PANTOPRAZOLE SODIUM 40 MG IV SOLR
40.0000 mg | Freq: Two times a day (BID) | INTRAVENOUS | Status: DC
  Administered 2024-06-17 – 2024-06-18 (×3): 40 mg via INTRAVENOUS
  Filled 2024-06-17 (×3): qty 10

## 2024-06-17 MED ORDER — ALBUTEROL SULFATE HFA 108 (90 BASE) MCG/ACT IN AERS
2.0000 | INHALATION_SPRAY | Freq: Three times a day (TID) | RESPIRATORY_TRACT | Status: DC
  Administered 2024-06-17: 2 via RESPIRATORY_TRACT

## 2024-06-17 MED ORDER — SODIUM CHLORIDE 0.9 % IV SOLN: 3.0000 g | Freq: Four times a day (QID) | INTRAVENOUS | Status: DC

## 2024-06-17 MED ORDER — BUPROPION HCL ER (XL) 150 MG PO TB24
150.0000 mg | ORAL_TABLET | Freq: Every morning | ORAL | Status: DC
  Administered 2024-06-18 – 2024-06-19 (×2): 150 mg via ORAL
  Filled 2024-06-17 (×2): qty 1

## 2024-06-17 MED ORDER — FLUTICASONE PROPIONATE 50 MCG/ACT NA SUSP: 1.0000 | Freq: Every day | NASAL | Status: DC | PRN

## 2024-06-17 MED ORDER — ENOXAPARIN SODIUM 40 MG/0.4ML IJ SOSY
40.0000 mg | PREFILLED_SYRINGE | INTRAMUSCULAR | Status: DC
  Administered 2024-06-17 – 2024-06-18 (×2): 40 mg via SUBCUTANEOUS
  Filled 2024-06-17 (×2): qty 0.4

## 2024-06-17 MED ORDER — ESCITALOPRAM OXALATE 10 MG PO TABS
20.0000 mg | ORAL_TABLET | Freq: Every day | ORAL | Status: DC
  Administered 2024-06-17 – 2024-06-18 (×2): 20 mg via ORAL
  Filled 2024-06-17 (×2): qty 2

## 2024-06-17 MED ORDER — ZOLPIDEM TARTRATE 5 MG PO TABS: 5.0000 mg | ORAL_TABLET | Freq: Every evening | ORAL | Status: DC | PRN

## 2024-06-17 MED ORDER — SORBITOL 70 % SOLN
30.0000 mL | Status: AC
  Administered 2024-06-17 (×2): 30 mL via ORAL
  Filled 2024-06-17 (×2): qty 30

## 2024-06-17 MED ORDER — LEVALBUTEROL HCL 0.63 MG/3ML IN NEBU: 0.6300 mg | INHALATION_SOLUTION | Freq: Four times a day (QID) | RESPIRATORY_TRACT | Status: DC | PRN

## 2024-06-17 MED ORDER — ACETAMINOPHEN 325 MG PO TABS: 650.0000 mg | ORAL_TABLET | Freq: Four times a day (QID) | ORAL | Status: DC | PRN

## 2024-06-17 MED ORDER — SODIUM CHLORIDE 0.9 % IV SOLN: INTRAVENOUS | Status: DC

## 2024-06-17 MED ORDER — OXYCODONE HCL 5 MG PO TABS
5.0000 mg | ORAL_TABLET | ORAL | Status: DC | PRN
  Administered 2024-06-18 – 2024-06-19 (×2): 5 mg via ORAL
  Filled 2024-06-17 (×2): qty 1

## 2024-06-17 MED ORDER — HYDRALAZINE HCL 20 MG/ML IJ SOLN: 10.0000 mg | Freq: Four times a day (QID) | INTRAMUSCULAR | Status: DC | PRN

## 2024-06-17 MED ORDER — DICLOFENAC SODIUM 1 % EX GEL: 2.0000 g | Freq: Four times a day (QID) | CUTANEOUS | Status: DC | PRN

## 2024-06-17 MED ORDER — PROCHLORPERAZINE EDISYLATE 10 MG/2ML IJ SOLN
10.0000 mg | Freq: Once | INTRAMUSCULAR | Status: AC
  Administered 2024-06-17: 10 mg via INTRAVENOUS
  Filled 2024-06-17: qty 2

## 2024-06-17 NOTE — H&P (Signed)
 History and Physical    [name] FMW:[id number] DOB: [birth year] DOA: 06/17/2024  PCP: Sun, Vyvyan, MD  Patient coming from: Home  I have personally briefly reviewed patient's old medical records in [facility]  Chief Complaint: Nausea vomiting abdominal pain to keep anything down.  HPI: [name] is a 53 y.o. female with medical history significant of hypertension, diabetes mellitus type 2, osteoarthritis, fibromyalgia, lupus, depression, ADHD, hyperlipidemia presenting to the ED with a 3-day history of intractable nausea vomiting and upper abdominal/epigastric pain.  Patient states had a bunionectomy done of her right foot 8 days prior to admission was placed on amoxicillin  which she was able to tolerate until 4 days prior to admission which she completed.  Patient stated had 1-1/2 more days of antibiotics to complete prior to her nausea and vomiting and inability to keep anything down.  Patient does endorse constipation and has not had a bowel movement for over 2 weeks.  Patient endorses some palpitations with some associated shortness of breath with resolution of shortness of breath.  Patient denies any fevers, no chills, no chest pain, no melena, no hematemesis, no hematochezia, no dysuria, no lightheadedness, no dizziness, no syncopal episodes, no diarrhea. Patient noted to have been seen in the ED with similar symptoms 1 day prior to admission workup done reassuring and patient discharged home on antiemetics and hydrated.  Patient stated due to ongoing nausea and vomiting without any improvement return back to the ED on day of admission.  ED Course: Patient seen in the ED noted to have elevated blood pressures with systolics in the 170s, noted to be tachycardic with heart rates in the 120s, lactic acid noted at 0.8, urinalysis done nitrite negative leukocytes -0-5 WBCs.  Comprehensive metabolic profile done with a bicarb of 19, glucose of 193, creatinine 1.04,  protein of 8.5, otherwise within normal limits.  CBC done unremarkable.  Lipase noted at 32.  Patient noted to have received IV Compazine 10 mg, IV Benadryl , IV Pepcid, 1 L IV fluid bolus, 10 mg of IV Reglan , 4 mg of IV morphine  however with noes significant clinical improvement with ongoing nausea and emesis hospitalist were called to admit the patient.  Review of Systems: As per HPI otherwise all other systems reviewed and are negative.  Past Medical History:  Diagnosis Date   ADD (attention deficit disorder with hyperactivity)    Allergic rhinitis    Anxiety    Arthritis    Depression    Diabetes mellitus without complication (HCC)    Dysmenorrhea    Fibromyalgia    GERD (gastroesophageal reflux disease)    H. pylori infection    per patient    HLD (hyperlipidemia)    HTN (hypertension)    Insomnia    Irritable bowel syndrome    Restless leg syndrome    Sleep apnea     Past Surgical History:  Procedure Laterality Date   BUNIONECTOMY Left 2008   DILATION AND CURETTAGE OF UTERUS  2004   EAR CYST EXCISION Left 1983   ESOPHAGEAL MANOMETRY N/A 01/02/2023   Procedure: ESOPHAGEAL MANOMETRY (EM);  Surgeon: Legrand Victory LITTIE DOUGLAS, MD;  Location: [facility];  Service: Gastroenterology;  Laterality: N/A;   KNEE ARTHROSCOPY Right    with knee cap adjustment   PH IMPEDANCE STUDY N/A 01/02/2023   Procedure: PH IMPEDANCE STUDY;  Surgeon: Legrand Victory LITTIE DOUGLAS, MD;  Location: [facility];  Service: Gastroenterology;  Laterality: N/A;   UPPER GASTROINTESTINAL ENDOSCOPY  06/29/2020  Social History  reports that she quit smoking about 30 years ago. Her smoking use included cigarettes. She started smoking about 42 years ago. She has a 1.2 pack-year smoking history. She has never been exposed to tobacco smoke. She has never used smokeless tobacco. She reports that she does not drink alcohol and does not use drugs.  Allergies  Allergen Reactions   Lisinopril     Cough    Methylphenidate Other  (See Comments)    Other reaction(s): palpitations   Prednisone     Other reaction(s): rapid heartbeat    Family History  Problem Relation Age of Onset   Breast cancer Maternal Grandmother    Diabetes Sister    Irritable bowel syndrome Sister    Heart disease Brother    Heart attack Brother    Stroke Mother    Heart disease Mother    Diabetes Sister    Irritable bowel syndrome Sister    Fibromyalgia Daughter    Colon cancer Neg Hx    Colon polyps Neg Hx    Esophageal cancer Neg Hx    Kidney disease Neg Hx    Gallbladder disease Neg Hx    Stomach cancer Neg Hx    Mother deceased age 74 from acute CVA.  Father deceased age 55 from natural causes.  Prior to Admission medications   Medication Sig Start Date End Date Taking? Authorizing Provider  albuterol  (VENTOLIN  HFA) 108 (90 Base) MCG/ACT inhaler Inhale 2 puffs into the lungs in the morning, at noon, in the evening, and at bedtime. 04/17/23   Kara Dorn NOVAK, MD  amLODipine (NORVASC) 10 MG tablet Take 10 mg by mouth daily.    [provider]  amphetamine-dextroamphetamine (ADDERALL) 10 MG tablet Take 10 mg by mouth daily with breakfast.    [provider]  buPROPion (WELLBUTRIN XL) 150 MG 24 hr tablet Take 150 mg by mouth every morning. 05/11/21   [provider]  diclofenac  Sodium (VOLTAREN ) 1 % GEL Apply 2-4 grams to affected joint 4 times daily as needed. 03/26/24   Cheryl Waddell HERO, PA-C  escitalopram (LEXAPRO) 20 MG tablet Take 20 mg by mouth at bedtime.  11/11/19   [provider]  fluticasone  (FLONASE ) 50 MCG/ACT nasal spray Place 1 spray into both nostrils daily. 02/28/23   Kara Dorn NOVAK, MD  fluticasone -salmeterol (ADVAIR HFA) 115-21 MCG/ACT inhaler Inhale 2 puffs into the lungs 2 (two) times daily. 04/17/23   Kara Dorn NOVAK, MD  gabapentin (NEURONTIN) 300 MG capsule Take 3 capsules by mouth at bedtime.    [provider]  HYDROcodone -acetaminophen  (NORCO/VICODIN) 5-325 MG  tablet Take one to two tablets every six to eight hours as needed for pain. 06/09/24   McCaughan, Dia D, DPM  hydroxychloroquine  (PLAQUENIL ) 200 MG tablet TAKE 1 TABLET BY MOUTH TWICE DAILY MONDAY THROUGH FRIDAY ONLY (NONE ON SATURDAY OR SUNDAY) 03/26/24   Cheryl Waddell HERO, PA-C  ibuprofen  (ADVIL ) 800 MG tablet Take 800 mg by mouth 3 (three) times daily. 07/17/23   [provider]  ipratropium (ATROVENT ) 0.03 % nasal spray Place 2 sprays into both nostrils every 12 (twelve) hours as needed for rhinitis.    [provider]  LINZESS  72 MCG capsule Take 1 capsule (72 mcg total) by mouth every other day. 03/27/24   Legrand Victory LITTIE DOUGLAS, MD  losartan (COZAAR) 50 MG tablet Take 50 mg by mouth daily. 07/01/23   [provider]  metoCLOPramide  (REGLAN ) 5 MG tablet TAKE 1 TABLET (5  MG TOTAL) BY MOUTH EVERY 8 (EIGHT) HOURS AS NEEDED FOR NAUSEA. 10/29/23   Legrand Victory LITTIE DOUGLAS, MD  naproxen  (NAPROSYN ) 500 MG tablet Take 500 mg by mouth as needed. 05/18/21   [provider]  omeprazole  (PRILOSEC) 20 MG capsule TAKE 2 CAPSULES EVERY DAY (NEED MD APPOINTMENT) 05/28/24   Legrand Victory LITTIE DOUGLAS, MD  ondansetron  (ZOFRAN ) 4 MG tablet Take 1 tablet (4 mg total) by mouth every 6 (six) hours. 06/16/24   Jerral Meth, MD  OZEMPIC, 0.25 OR 0.5 MG/DOSE, 2 MG/1.5ML SOPN Inject 0.25 mg into the skin at bedtime. 06/13/20   [provider]  OZEMPIC, 1 MG/DOSE, 4 MG/3ML SOPN Inject 1 mg into the skin once a week. 11/18/23   [provider]  Prucalopride Succinate  (MOTEGRITY ) 2 MG TABS Take 1 tablet (2 mg total) by mouth daily at 8 pm. 05/28/23   Danis, Victory LITTIE DOUGLAS, MD  [facility] injection  05/04/22   [provider]  tiZANidine  (ZANAFLEX ) 4 MG tablet TAKE 1 TABLET EVERY 8 HOURS AS NEEDED FOR MUSCLE SPASM(S) 06/02/24   Magnant, Carlin LITTIE, PA-C  valACYclovir (VALTREX) 500 MG tablet Take 500 mg by mouth 2 (two) times daily as needed. 09/28/23   [provider]  zolpidem  (AMBIEN ) 5  MG tablet TAKE 1 TABLET (5 MG TOTAL) BY MOUTH AT BEDTIME AS NEEDED FOR SLEEP. 08/10/20   Dolphus Reiter, MD    Physical Exam: Vitals:   06/17/24 1234 06/17/24 1415 06/17/24 1445 06/17/24 1519  BP: (!) 155/85 (!) 168/92 (!) 170/89   Pulse: (!) 126 (!) 108 (!) 113   Resp: (!) 23 18 14    Temp: 98.2 F (36.8 C)     TempSrc: Oral     SpO2: 98% 96% 99% 98%  Weight:      Height:        Constitutional: NAD, calm, comfortable Vitals:   06/17/24 1234 06/17/24 1415 06/17/24 1445 06/17/24 1519  BP: (!) 155/85 (!) 168/92 (!) 170/89   Pulse: (!) 126 (!) 108 (!) 113   Resp: (!) 23 18 14    Temp: 98.2 F (36.8 C)     TempSrc: Oral     SpO2: 98% 96% 99% 98%  Weight:      Height:       Eyes: PERRL, lids and conjunctivae normal ENMT: Mucous membranes are dry. Posterior pharynx clear of any exudate or lesions.Normal dentition.  Neck: normal, supple, no masses, no thyromegaly Respiratory: clear to auscultation bilaterally, no wheezing, no crackles. Normal respiratory effort. No accessory muscle use.  Cardiovascular: Tachycardia.  No murmurs rubs or gallops.  No lower extremity pitting edema. Abdomen: Soft, nondistended, some tenderness to palpation epigastrium, positive bowel sounds, no rebound, no guarding.  Musculoskeletal: no clubbing / cyanosis. No joint deformity upper and lower extremities. Good ROM, no contractures. Normal muscle tone.  Right foot in dressing in postop boot. Skin: no rashes, lesions, ulcers. No induration Neurologic: CN 2-12 grossly intact. Sensation intact, DTR normal. Strength 5/5 in all 4.  Psychiatric: Normal judgment and insight. Alert and oriented x 3. Normal mood.   Labs on Admission: I have personally reviewed following labs and imaging studies  CBC: Recent Labs  Lab 06/16/24 0010 06/17/24 0600  WBC 9.2 7.7  NEUTROABS  --  3.6  HGB 15.0 13.7  HCT 45.5 42.9  MCV 82.4 84.4  PLT 436* 395    Basic Metabolic Panel: Recent Labs  Lab 06/16/24 0010  06/16/24 0453 06/17/24 0600  NA 141 137  138  K 3.5 3.0* 3.6  CL 104 103 101  CO2 17* 22 19*  GLUCOSE 203* 111* 193*  BUN 11 10 10   CREATININE 0.93 0.85 1.04*  CALCIUM  9.4 8.4* 9.3    GFR: Estimated Creatinine Clearance: 73.7 mL/min (A) (by C-G formula based on SCr of 1.04 mg/dL (H)).  Liver Function Tests: Recent Labs  Lab 06/16/24 0010 06/17/24 0600  AST 39 37  ALT 34 35  ALKPHOS 94 84  BILITOT 1.0 1.2  PROT 8.6* 8.5*  ALBUMIN 4.5 4.3    Urine analysis:    Component Value Date/Time   COLORURINE AMBER (A) 06/17/2024 0856   APPEARANCEUR HAZY (A) 06/17/2024 0856   LABSPEC 1.023 06/17/2024 0856   PHURINE 5.0 06/17/2024 0856   GLUCOSEU NEGATIVE 06/17/2024 0856   HGBUR NEGATIVE 06/17/2024 0856   BILIRUBINUR NEGATIVE 06/17/2024 0856   KETONESUR 80 (A) 06/17/2024 0856   PROTEINUR 100 (A) 06/17/2024 0856   UROBILINOGEN 1.0 02/01/2013 0513   NITRITE NEGATIVE 06/17/2024 0856   LEUKOCYTESUR NEGATIVE 06/17/2024 0856    Radiological Exams on Admission: DG Foot Complete Right Result Date: 06/17/2024 Please see detailed radiograph report in office note.  CT ABDOMEN PELVIS W CONTRAST Result Date: 06/16/2024 CLINICAL DATA:  Abdominal pain EXAM: CT ABDOMEN AND PELVIS WITH CONTRAST TECHNIQUE: Multidetector CT imaging of the abdomen and pelvis was performed using the standard protocol following bolus administration of intravenous contrast. RADIATION DOSE REDUCTION: This exam was performed according to the departmental dose-optimization program which includes automated exposure control, adjustment of the mA and/or kV according to patient size and/or use of iterative reconstruction technique. CONTRAST:  OMNIPAQUE  IOHEXOL  350 MG/ML SOLN COMPARISON:  05/26/2023 FINDINGS: Hepatobiliary: Fatty infiltration of the liver is noted. The gallbladder is within normal limits. Pancreas: Unremarkable. No pancreatic ductal dilatation or surrounding inflammatory changes. Spleen: Normal in size  without focal abnormality. Adrenals/Urinary Tract: Adrenal glands are unremarkable. Kidneys demonstrate a normal enhancement pattern bilaterally. No renal calculi are seen. No obstructive changes are noted. The bladder is decompressed. Stomach/Bowel: No obstructive or inflammatory changes of the colon are seen. Mild diverticular change is noted without diverticulitis. The appendix is air-filled and limits. Small bowel and stomach are unremarkable. Vascular/Lymphatic: No significant vascular findings are present. No enlarged abdominal or pelvic lymph nodes. Reproductive: Uterus and bilateral adnexa are unremarkable. Other: No abdominal wall hernia or abnormality. No abdominopelvic ascites. Musculoskeletal: No acute or significant osseous findings. IMPRESSION: Fatty liver. Diverticulosis without diverticulitis. Electronically Signed   By: Oneil Devonshire M.D.   On: 06/16/2024 02:11   CT Angio Chest Pulmonary Embolism (PE) W or WO Contrast Result Date: 06/16/2024 CLINICAL DATA:  Chest and abdominal pain EXAM: CT ANGIOGRAPHY CHEST WITH CONTRAST TECHNIQUE: Multidetector CT imaging of the chest was performed using the standard protocol during bolus administration of intravenous contrast. Multiplanar CT image reconstructions and MIPs were obtained to evaluate the vascular anatomy. RADIATION DOSE REDUCTION: This exam was performed according to the departmental dose-optimization program which includes automated exposure control, adjustment of the mA and/or kV according to patient size and/or use of iterative reconstruction technique. CONTRAST:  OMNIPAQUE  IOHEXOL  350 MG/ML SOLN COMPARISON:  None Available. FINDINGS: Cardiovascular: Thoracic aorta its branches are within normal limits. No cardiac enlargement is seen. Pulmonary artery shows a normal branching pattern bilaterally. No filling defect to suggest pulmonary embolism is noted. Mediastinum/Nodes: Thoracic inlet is within normal limits. No hilar or mediastinal  adenopathy is noted. The esophagus as visualized is within normal limits. Lungs/Pleura: Lungs are  well aerated bilaterally. No focal infiltrate or effusion is noted. Tiny subpleural left upper lobe nodule is noted measuring 2-3 mm. No follow-up is recommended. Musculoskeletal: No acute bony abnormality noted. Review of the MIP images confirms the above findings. IMPRESSION: No evidence of pulmonary emboli. Tiny subpleural left upper lobe nodule. No follow-up is recommended. Electronically Signed   By: Oneil Devonshire M.D.   On: 06/16/2024 02:09    EKG: Independently reviewed.  Sinus tachycardia.  Assessment/Plan Principal Problem:   Nausea & vomiting Active Problems:   Esophageal reflux   Anemia, iron deficiency   Fibromyalgia   History of gastroesophageal reflux (GERD)   HTN (hypertension)   Hyperlipidemia   DM (diabetes mellitus), type 2 (HCC)   Dehydration   Constipation   S/P bunionectomy   Depression    #1 nausea vomiting epigastric abdominal pain -??  Etiology. -??  Viral gastroenteritis versus antibiotic induced however patient does state tolerating oral antibiotics for 4 days prior to onset of her symptoms. - Patient seen in the ED  06/16/2024 and underwent CT abdomen and pelvis which was unremarkable except noting a fatty liver and diverticulosis without diverticulitis. - Lipase within normal limits. - Patient noted to have undergone upper endoscopy 07/04/2020 which showed a normal esophagus, congestive gastropathy, normal duodenum with biopsies showing positive H. pylori, chronic active gastritis. - Place on PPI IV every 12 hours, bowel rest, IV antiemetics, IV pain medication, supportive care. - Follow.  2.  GERD -IV PPI.  3.  Type 2 diabetes mellitus -Check a hemoglobin A1c. - Patient noted to be on Ozempic prior to admission which we will hold for now. - SSI.  4.  Dehydration -IV fluids.  5.  Status post bunionectomy - Patient noted to have a bunionectomy done 8 days  prior to admission noted to have completed 4 days of oral antibiotics prior to onset of symptoms with intractable nausea and vomiting unable to keep anything down. - Placed on IV Unasyn for 3 more days to complete an empiric 7-day course of antibiotic treatment. - Patient states saw podiatrist 1 day prior to admission and will be following up with podiatry in 2 weeks. - PT/OT. - Outpatient follow-up with podiatry.  6.  Constipation -Patient states has not had a bowel movement for over 2 weeks. - Patient noted to be on opiate pain medication per med rec likely drug induced. - Dulcolax suppository daily. - Soapsuds enema. - Once nausea and vomiting has improved and resolved will need to be placed on a bowel regimen.  7.  Sinus tachycardia -Likely secondary to problem #1 in the setting of dehydration. - EKG with no ischemic changes noted. - IV fluids.  8.  Hypertension -BP elevated with systolics in the 170s secondary to inability to keep medications down due to 3-day history of nausea vomiting and epigastric abdominal pain. - Place on Lopressor 5 mg IV every 8 hours until able to tolerate oral intake and resume home regimen oral antihypertensive medications.  9.  Depression/fibromyalgia -Resume home regimen Wellbutrin, Lexapro, when able to tolerate oral intake.  10.  History of lupus -Resume home regimen Plaquenil .   DVT prophylaxis: Lovenox Code Status:   Full Family Communication:  Updated patient.  No family at bedside. Disposition Plan:   Patient is from:  Home  Anticipated DC to:  Home  Anticipated DC date:  2 to 3 days  Anticipated DC barriers: Clinical improvement ability to tolerate oral intake.  Consults called: None Admission status: Place in  observation/telemetry  Severity of Illness: The appropriate patient status for this patient is OBSERVATION. Observation status is judged to be reasonable and necessary in order to provide the required intensity of service to  ensure the patient's safety. The patient's presenting symptoms, physical exam findings, and initial radiographic and laboratory data in the context of their medical condition is felt to place them at decreased risk for further clinical deterioration. Furthermore, it is anticipated that the patient will be medically stable for discharge from the hospital within 2 midnights of admission.     Toribio Hummer MD [facility]  How to contact the [facility] Attending or Consulting provider 7A - 7P or covering provider during after hours 7P -7A, for this patient?   Check the care team in [facility] and look for a) attending/consulting [facility] provider listed and b) the [facility] team listed Log into [email or web address] and use [facility] universal password to access. If you do not have the password, please contact the hospital operator. Locate the [facility] provider you are looking for under [facility] and page to a number that you can be directly reached. If you still have difficulty reaching the provider, please page the [facility] (Director on Call) for the Hospitalists listed on amion for assistance.  06/17/2024, 3:53 PM

## 2024-06-17 NOTE — Progress Notes (Signed)
  PT Cancellation Note  Patient Details Name: [name] MRN: [id number] DOB: 1971/11/08   Cancelled Treatment:    Reason Eval/Treat Not Completed: Other (comment)PT order received, noted patient is for admission. PT will follow  at another time.  Darice Potters PT Acute Rehabilitation Services Office [phone]    Potters Darice Norris 06/17/2024, 2:33 PM

## 2024-06-17 NOTE — Plan of Care (Signed)
  Problem: Education: Goal: Ability to describe self-care measures that may prevent or decrease complications (Diabetes Survival Skills Education) will improve Outcome: Progressing   Problem: Nutritional: Goal: Maintenance of adequate nutrition will improve Outcome: Progressing   Problem: Activity: Goal: Risk for activity intolerance will decrease Outcome: Progressing   Problem: Nutrition: Goal: Adequate nutrition will be maintained Outcome: Progressing

## 2024-06-17 NOTE — ED Notes (Signed)
 Called and spoke with [name] patient is ready to go up.

## 2024-06-17 NOTE — ED Triage Notes (Signed)
 Pt arrived with EMS for NV that has been ongoing since taking amoxicillin  for prior foot surgery. Pt reports zofran  in not helping with nausea at home. Pain localized to mid abd and radiating up. New onset dizziness tonight

## 2024-06-17 NOTE — ED Notes (Signed)
 ED TO INPATIENT HANDOFF REPORT  Name/Age/Gender [name] 53 y.o. female  Code Status    Code Status Orders  (From admission, onward)           Start     Ordered   06/17/24 1420  Full code  Continuous       Question:  By:  Answer:  Consent: discussion documented in EHR   06/17/24 1424           Code Status History     This patient has a current code status but no historical code status.       Home/SNF/Other Home  Chief Complaint Nausea & vomiting [R11.2]  Level of Care/Admitting Diagnosis ED Disposition     ED Disposition  Admit   Condition  --   Comment  Hospital Area: [facility] [[id number]]  Level of Care: Telemetry [5]  Admit to tele based on following criteria: Complex arrhythmia (Bradycardia/Tachycardia)  May place patient in observation at [facility] or [facility] if equivalent level of care is available:: No  Covid Evaluation: Asymptomatic - no recent exposure (last 10 days) testing not required  Diagnosis: Nausea & vomiting [277057]  Admitting Physician: SEBASTIAN TORIBIO GAILS [[id number]]  Attending Physician: FREDDI HAMILTON [[id number]]          Medical History Past Medical History:  Diagnosis Date   ADD (attention deficit disorder with hyperactivity)    Allergic rhinitis    Anxiety    Arthritis    Depression    Diabetes mellitus without complication (HCC)    Dysmenorrhea    Fibromyalgia    GERD (gastroesophageal reflux disease)    H. pylori infection    per patient    HLD (hyperlipidemia)    HTN (hypertension)    Insomnia    Irritable bowel syndrome    Restless leg syndrome    Sleep apnea     Allergies Allergies  Allergen Reactions   Lisinopril     Cough    Methylphenidate Other (See Comments)    Other reaction(s): palpitations   Prednisone     Other reaction(s): rapid heartbeat    IV Location/Drains/Wounds Patient Lines/Drains/Airways Status     Active Line/Drains/Airways     Name  Placement date Placement time Site Days   Peripheral IV 06/17/24 22 G Left Antecubital 06/17/24  0604  Antecubital  less than 1            Labs/Imaging Results for orders placed or performed during the hospital encounter of 06/17/24 (from the past 48 hours)  CBC with Differential     Status: Abnormal   Collection Time: 06/17/24  6:00 AM  Result Value Ref Range   WBC 7.7 4.0 - 10.5 K/uL   RBC 5.08 3.87 - 5.11 MIL/uL   Hemoglobin 13.7 12.0 - 15.0 g/dL   HCT 57.0 63.9 - 53.9 %   MCV 84.4 80.0 - 100.0 fL   MCH 27.0 26.0 - 34.0 pg   MCHC 31.9 30.0 - 36.0 g/dL   RDW 84.3 (H) 88.4 - 84.4 %   Platelets 395 150 - 400 K/uL   nRBC 0.0 0.0 - 0.2 %   Neutrophils Relative % 47 %   Neutro Abs 3.6 1.7 - 7.7 K/uL   Lymphocytes Relative 45 %   Lymphs Abs 3.5 0.7 - 4.0 K/uL   Monocytes Relative 8 %   Monocytes Absolute 0.6 0.1 - 1.0 K/uL   Eosinophils Relative 0 %   Eosinophils Absolute 0.0 0.0 -  0.5 K/uL   Basophils Relative 0 %   Basophils Absolute 0.0 0.0 - 0.1 K/uL   Immature Granulocytes 0 %   Abs Immature Granulocytes 0.03 0.00 - 0.07 K/uL    Comment: Performed at [facility], [address], [place], [place] [address]  Comprehensive metabolic panel     Status: Abnormal   Collection Time: 06/17/24  6:00 AM  Result Value Ref Range   Sodium 138 135 - 145 mmol/L   Potassium 3.6 3.5 - 5.1 mmol/L   Chloride 101 98 - 111 mmol/L   CO2 19 (L) 22 - 32 mmol/L   Glucose, Bld 193 (H) 70 - 99 mg/dL    Comment: Glucose reference range applies only to samples taken after fasting for at least 8 hours.   BUN 10 6 - 20 mg/dL   Creatinine, Ser 8.95 (H) 0.44 - 1.00 mg/dL   Calcium  9.3 8.9 - 10.3 mg/dL   Total Protein 8.5 (H) 6.5 - 8.1 g/dL   Albumin 4.3 3.5 - 5.0 g/dL   AST 37 15 - 41 U/L   ALT 35 0 - 44 U/L   Alkaline Phosphatase 84 38 - 126 U/L   Total Bilirubin 1.2 0.0 - 1.2 mg/dL   GFR, Estimated >39 >39 mL/min    Comment: (NOTE) Calculated using the CKD-EPI Creatinine  Equation (2021)    Anion gap 18 (H) 5 - 15    Comment: Performed at [facility], [address] [place], [place] [address]  Lipase, blood     Status: None   Collection Time: 06/17/24  6:00 AM  Result Value Ref Range   Lipase 32 11 - 51 U/L    Comment: Performed at [facility], [address], [place], [place] [address]  Lactic acid, plasma     Status: None   Collection Time: 06/17/24  8:11 AM  Result Value Ref Range   Lactic Acid, Venous 1.1 0.5 - 1.9 mmol/L    Comment: Performed at [facility], [address], [place], [place] [address]  Beta-hydroxybutyric acid     Status: Abnormal   Collection Time: 06/17/24  8:11 AM  Result Value Ref Range   Beta-Hydroxybutyric Acid 1.30 (H) 0.05 - 0.27 mmol/L    Comment: Performed at [facility], [address], [place], [place] [address]  I-Stat CG4 Lactic Acid, ED     Status: None   Collection Time: 06/17/24  8:30 AM  Result Value Ref Range   Lactic Acid, Venous 1.2 0.5 - 1.9 mmol/L  POC CBG, ED     Status: Abnormal   Collection Time: 06/17/24  8:36 AM  Result Value Ref Range   Glucose-Capillary 112 (H) 70 - 99 mg/dL    Comment: Glucose reference range applies only to samples taken after fasting for at least 8 hours.  Urinalysis, Routine w reflex microscopic -Urine, Clean Catch     Status: Abnormal   Collection Time: 06/17/24  8:56 AM  Result Value Ref Range   Color, Urine AMBER (A) YELLOW    Comment: BIOCHEMICALS MAY BE AFFECTED BY COLOR   APPearance HAZY (A) CLEAR   Specific Gravity, Urine 1.023 1.005 - 1.030   pH 5.0 5.0 - 8.0   Glucose, UA NEGATIVE NEGATIVE mg/dL   Hgb urine dipstick NEGATIVE NEGATIVE   Bilirubin Urine NEGATIVE NEGATIVE   Ketones, ur 80 (A) NEGATIVE mg/dL   Protein, ur 899 (A) NEGATIVE mg/dL   Nitrite NEGATIVE NEGATIVE  Leukocytes,Ua NEGATIVE NEGATIVE   RBC / HPF 0-5 0 - 5 RBC/hpf   WBC, UA 0-5 0 - 5 WBC/hpf   Bacteria, UA NONE SEEN NONE SEEN    Squamous Epithelial / HPF 6-10 0 - 5 /HPF   Mucus PRESENT    Hyaline Casts, UA PRESENT     Comment: Performed at [facility], [address] [place], [place] [address]  Lactic acid, plasma     Status: None   Collection Time: 06/17/24 11:00 AM  Result Value Ref Range   Lactic Acid, Venous 0.8 0.5 - 1.9 mmol/L    Comment: Performed at [facility], [address] [place], [place] [address]  Blood gas, venous (at [facility] and [facility])     Status: Abnormal   Collection Time: 06/17/24 11:30 AM  Result Value Ref Range   pH, Ven 7.41 7.25 - 7.43   pCO2, Ven 43 (L) 44 - 60 mmHg   pO2, Ven 69 (H) 32 - 45 mmHg   Bicarbonate 27.3 20.0 - 28.0 mmol/L   Acid-Base Excess 2.3 (H) 0.0 - 2.0 mmol/L   O2 Saturation 95.4 %   Patient temperature 37.0     Comment: Performed at [facility], [address] [place], [place] [address]   DG Foot Complete Right Result Date: 06/17/2024 Please see detailed radiograph report in office note.  CT ABDOMEN PELVIS W CONTRAST Result Date: 06/16/2024 CLINICAL DATA:  Abdominal pain EXAM: CT ABDOMEN AND PELVIS WITH CONTRAST TECHNIQUE: Multidetector CT imaging of the abdomen and pelvis was performed using the standard protocol following bolus administration of intravenous contrast. RADIATION DOSE REDUCTION: This exam was performed according to the departmental dose-optimization program which includes automated exposure control, adjustment of the mA and/or kV according to patient size and/or use of iterative reconstruction technique. CONTRAST:  OMNIPAQUE  IOHEXOL  350 MG/ML SOLN COMPARISON:  05/26/2023 FINDINGS: Hepatobiliary: Fatty infiltration of the liver is noted. The gallbladder is within normal limits. Pancreas: Unremarkable. No pancreatic ductal dilatation or surrounding inflammatory changes. Spleen: Normal in size without focal abnormality. Adrenals/Urinary Tract: Adrenal glands are unremarkable. Kidneys demonstrate a  normal enhancement pattern bilaterally. No renal calculi are seen. No obstructive changes are noted. The bladder is decompressed. Stomach/Bowel: No obstructive or inflammatory changes of the colon are seen. Mild diverticular change is noted without diverticulitis. The appendix is air-filled and limits. Small bowel and stomach are unremarkable. Vascular/Lymphatic: No significant vascular findings are present. No enlarged abdominal or pelvic lymph nodes. Reproductive: Uterus and bilateral adnexa are unremarkable. Other: No abdominal wall hernia or abnormality. No abdominopelvic ascites. Musculoskeletal: No acute or significant osseous findings. IMPRESSION: Fatty liver. Diverticulosis without diverticulitis. Electronically Signed   By: Oneil Devonshire M.D.   On: 06/16/2024 02:11   CT Angio Chest Pulmonary Embolism (PE) W or WO Contrast Result Date: 06/16/2024 CLINICAL DATA:  Chest and abdominal pain EXAM: CT ANGIOGRAPHY CHEST WITH CONTRAST TECHNIQUE: Multidetector CT imaging of the chest was performed using the standard protocol during bolus administration of intravenous contrast. Multiplanar CT image reconstructions and MIPs were obtained to evaluate the vascular anatomy. RADIATION DOSE REDUCTION: This exam was performed according to the departmental dose-optimization program which includes automated exposure control, adjustment of the mA and/or kV according to patient size and/or use of iterative reconstruction technique. CONTRAST:  OMNIPAQUE  IOHEXOL  350 MG/ML SOLN COMPARISON:  None Available. FINDINGS: Cardiovascular: Thoracic aorta its branches are within normal limits. No cardiac enlargement is seen. Pulmonary artery shows a normal branching  pattern bilaterally. No filling defect to suggest pulmonary embolism is noted. Mediastinum/Nodes: Thoracic inlet is within normal limits. No hilar or mediastinal adenopathy is noted. The esophagus as visualized is within normal limits. Lungs/Pleura: Lungs are well aerated  bilaterally. No focal infiltrate or effusion is noted. Tiny subpleural left upper lobe nodule is noted measuring 2-3 mm. No follow-up is recommended. Musculoskeletal: No acute bony abnormality noted. Review of the MIP images confirms the above findings. IMPRESSION: No evidence of pulmonary emboli. Tiny subpleural left upper lobe nodule. No follow-up is recommended. Electronically Signed   By: Oneil Devonshire M.D.   On: 06/16/2024 02:09    Pending Labs Unresulted Labs (From admission, onward)     Start     Ordered   06/18/24 0500  Comprehensive metabolic panel  Tomorrow morning,   R        06/17/24 1424   06/18/24 0500  CBC  Tomorrow morning,   R        06/17/24 1424   06/18/24 0500  Magnesium  Tomorrow morning,   R        06/17/24 1424   06/17/24 1419  HIV Antibody (routine testing w rflx)  (HIV Antibody (Routine testing w reflex) panel)  Once,   R        06/17/24 1424            Vitals/Pain Today's Vitals   06/17/24 0845 06/17/24 1014 06/17/24 1015 06/17/24 1234  BP: (!) 148/91  (!) 153/88 (!) 155/85  Pulse: (!) 117  (!) 105 (!) 126  Resp: 20  17 (!) 23  Temp: 98.3 F (36.8 C)   98.2 F (36.8 C)  TempSrc: Oral   Oral  SpO2: 96%  96% 98%  Weight:      Height:      PainSc:  6       Isolation Precautions No active isolations  Medications Medications  0.9 %  sodium chloride  infusion ( Intravenous New Bag/Given 06/17/24 1253)  hydroxychloroquine  (PLAQUENIL ) tablet 200 mg (has no administration in time range)  buPROPion (WELLBUTRIN XL) 24 hr tablet 150 mg (has no administration in time range)  escitalopram (LEXAPRO) tablet 20 mg (has no administration in time range)  zolpidem  (AMBIEN ) tablet 5 mg (has no administration in time range)  linaclotide  (LINZESS ) capsule 72 mcg (has no administration in time range)  albuterol  (VENTOLIN  HFA) 108 (90 Base) MCG/ACT inhaler 2 puff (has no administration in time range)  fluticasone  (FLONASE ) 50 MCG/ACT nasal spray 1 spray (has no  administration in time range)  fluticasone  furoate-vilanterol (BREO ELLIPTA) 200-25 MCG/ACT 1 puff (has no administration in time range)  diclofenac  Sodium (VOLTAREN ) 1 % topical gel 2 g (has no administration in time range)  enoxaparin (LOVENOX) injection 40 mg (has no administration in time range)  sodium chloride  flush (NS) 0.9 % injection 3 mL (has no administration in time range)  acetaminophen  (TYLENOL ) tablet 650 mg (has no administration in time range)    Or  acetaminophen  (TYLENOL ) suppository 650 mg (has no administration in time range)  oxyCODONE (Oxy IR/ROXICODONE) immediate release tablet 5 mg (has no administration in time range)  morphine  (PF) 2 MG/ML injection 2 mg (has no administration in time range)  senna (SENOKOT) tablet 8.6 mg (has no administration in time range)  bisacodyl (DULCOLAX) suppository 10 mg (has no administration in time range)  levalbuterol (XOPENEX) nebulizer solution 0.63 mg (has no administration in time range)  hydrALAZINE (APRESOLINE) injection 10 mg (has no administration in  time range)  metoprolol tartrate (LOPRESSOR) injection 5 mg (has no administration in time range)  prochlorperazine (COMPAZINE) injection 10 mg (has no administration in time range)  pantoprazole  (PROTONIX ) injection 40 mg (has no administration in time range)  lactated ringers bolus 1,000 mL (0 mLs Intravenous Stopped 06/17/24 0832)  prochlorperazine (COMPAZINE) injection 10 mg (10 mg Intravenous Given 06/17/24 0620)  diphenhydrAMINE  (BENADRYL ) injection 25 mg (25 mg Intravenous Given 06/17/24 0620)  metoCLOPramide  (REGLAN ) injection 10 mg (10 mg Intravenous Given 06/17/24 0839)  morphine  (PF) 4 MG/ML injection 4 mg (4 mg Intravenous Given 06/17/24 0839)  famotidine (PEPCID) IVPB 20 mg premix (0 mg Intravenous Stopped 06/17/24 1103)    Mobility walks

## 2024-06-17 NOTE — ED Provider Notes (Signed)
 [place] EMERGENCY DEPARTMENT AT [facility] Provider Note   CSN: [id number] Arrival date & time: 06/17/24  9470     Patient presents with: Abdominal Pain   [name] is a 53 y.o. female.  Patient presents to the emergency department with concerns of abdominal pain, nausea and vomiting.  Reports pain is primarily in the epigastrium which has been the area pain over the last several days.  States that she was here few days ago and had improvement in symptoms while here but once discharged, has had recurrence.  States he has not really had much to eat or drink in the last 2 days.  History significant for diabetes, hypertension, hyperlipidemia.   Abdominal Pain Associated symptoms: nausea and vomiting        Prior to Admission medications   Medication Sig Start Date End Date Taking? Authorizing Provider  ACCU-CHEK GUIDE TEST test strip USE TO CHECK BLOOD SUGAR ALTERNATING MORNINGS AND EVENINGS BEFORE MEALS 10/28/23   [provider]  albuterol  (VENTOLIN  HFA) 108 (90 Base) MCG/ACT inhaler Inhale 2 puffs into the lungs in the morning, at noon, in the evening, and at bedtime. 04/17/23   Kara Dorn NOVAK, MD  amLODipine (NORVASC) 10 MG tablet Take 10 mg by mouth daily.    [provider]  amphetamine-dextroamphetamine (ADDERALL) 10 MG tablet Take 10 mg by mouth daily with breakfast.    [provider]  Blood Glucose Monitoring Suppl (ACCU-CHEK GUIDE ME) w/Device KIT See admin instructions. 03/08/22   [provider]  buPROPion (WELLBUTRIN XL) 150 MG 24 hr tablet Take 150 mg by mouth every morning. 05/11/21   [provider]  diclofenac  Sodium (VOLTAREN ) 1 % GEL Apply 2-4 grams to affected joint 4 times daily as needed. 03/26/24   Cheryl Waddell HERO, PA-C  escitalopram (LEXAPRO) 20 MG tablet Take 20 mg by mouth at bedtime.  11/11/19   [provider]  fluticasone  (FLONASE ) 50 MCG/ACT nasal spray Place 1 spray into both  nostrils daily. 02/28/23   Kara Dorn NOVAK, MD  fluticasone -salmeterol (ADVAIR HFA) 115-21 MCG/ACT inhaler Inhale 2 puffs into the lungs 2 (two) times daily. 04/17/23   Kara Dorn NOVAK, MD  gabapentin (NEURONTIN) 300 MG capsule Take 3 capsules by mouth at bedtime.    [provider]  HYDROcodone -acetaminophen  (NORCO/VICODIN) 5-325 MG tablet Take one to two tablets every six to eight hours as needed for pain. 06/09/24   McCaughan, Dia D, DPM  hydroxychloroquine  (PLAQUENIL ) 200 MG tablet TAKE 1 TABLET BY MOUTH TWICE DAILY MONDAY THROUGH FRIDAY ONLY (NONE ON SATURDAY OR SUNDAY) 03/26/24   Cheryl Waddell HERO, PA-C  ibuprofen  (ADVIL ) 800 MG tablet Take 800 mg by mouth 3 (three) times daily. 07/17/23   [provider]  ipratropium (ATROVENT ) 0.03 % nasal spray Place 2 sprays into both nostrils every 12 (twelve) hours as needed for rhinitis.    [provider]  LINZESS  72 MCG capsule Take 1 capsule (72 mcg total) by mouth every other day. 03/27/24   Legrand Victory LITTIE DOUGLAS, MD  losartan (COZAAR) 50 MG tablet Take 50 mg by mouth daily. 07/01/23   [provider]  metoCLOPramide  (REGLAN ) 5 MG tablet TAKE 1 TABLET (5 MG TOTAL) BY MOUTH EVERY 8 (EIGHT) HOURS AS NEEDED FOR NAUSEA. 10/29/23   Legrand Victory LITTIE DOUGLAS, MD  naproxen  (NAPROSYN ) 500 MG tablet Take 500 mg by mouth as needed. 05/18/21   [provider]  omeprazole  (PRILOSEC) 20 MG capsule TAKE 2 CAPSULES  EVERY DAY (NEED MD APPOINTMENT) 05/28/24   Legrand Victory LITTIE DOUGLAS, MD  ondansetron  (ZOFRAN ) 4 MG tablet Take 1 tablet (4 mg total) by mouth every 6 (six) hours. 06/16/24   Jerral Meth, MD  OZEMPIC, 0.25 OR 0.5 MG/DOSE, 2 MG/1.5ML SOPN Inject 0.25 mg into the skin at bedtime. 06/13/20   [provider]  OZEMPIC, 1 MG/DOSE, 4 MG/3ML SOPN Inject 1 mg into the skin once a week. 11/18/23   [provider]  Prucalopride Succinate  (MOTEGRITY ) 2 MG TABS Take 1 tablet (2 mg total) by mouth daily at 8 pm. 05/28/23   Danis,  Victory LITTIE DOUGLAS, MD  [facility] injection  05/04/22   [provider]  tiZANidine  (ZANAFLEX ) 4 MG tablet TAKE 1 TABLET EVERY 8 HOURS AS NEEDED FOR MUSCLE SPASM(S) 06/02/24   Magnant, Carlin LITTIE, PA-C  valACYclovir (VALTREX) 500 MG tablet Take 500 mg by mouth 2 (two) times daily as needed. 09/28/23   [provider]  zolpidem  (AMBIEN ) 5 MG tablet TAKE 1 TABLET (5 MG TOTAL) BY MOUTH AT BEDTIME AS NEEDED FOR SLEEP. 08/10/20   Dolphus Reiter, MD    Allergies: Lisinopril, Methylphenidate, and Prednisone    Review of Systems  Gastrointestinal:  Positive for abdominal pain, nausea and vomiting.  All other systems reviewed and are negative.   Updated Vital Signs BP (!) 155/85   Pulse (!) 126   Temp 98.2 F (36.8 C) (Oral)   Resp (!) 23   Ht 5' 6 (1.676 m)   Wt 99.8 kg   SpO2 98%   BMI 35.51 kg/m   Physical Exam Vitals and nursing note reviewed.  Constitutional:      General: She is not in acute distress.    Appearance: She is well-developed. She is ill-appearing.  HENT:     Head: Normocephalic and atraumatic.  Eyes:     Conjunctiva/sclera: Conjunctivae normal.  Cardiovascular:     Rate and Rhythm: Normal rate and regular rhythm.     Heart sounds: No murmur heard. Pulmonary:     Effort: Pulmonary effort is normal. No respiratory distress.     Breath sounds: Normal breath sounds.  Abdominal:     Palpations: Abdomen is soft.     Tenderness: There is abdominal tenderness in the epigastric area. There is no guarding.  Musculoskeletal:        General: No swelling.     Cervical back: Neck supple.  Skin:    General: Skin is warm and dry.     Capillary Refill: Capillary refill takes 2 to 3 seconds.     Coloration: Skin is not mottled.     Findings: No erythema.  Neurological:     Mental Status: She is alert.  Psychiatric:        Mood and Affect: Mood normal.     (all labs ordered are listed, but only abnormal results are displayed) Labs Reviewed  CBC WITH  DIFFERENTIAL/PLATELET - Abnormal; Notable for the following components:      Result Value   RDW 15.6 (*)    All other components within normal limits  COMPREHENSIVE METABOLIC PANEL WITH GFR - Abnormal; Notable for the following components:   CO2 19 (*)    Glucose, Bld 193 (*)    Creatinine, Ser 1.04 (*)    Total Protein 8.5 (*)    Anion gap 18 (*)    All other components within normal limits  URINALYSIS, ROUTINE W REFLEX MICROSCOPIC - Abnormal; Notable for the following components:  Color, Urine AMBER (*)    APPearance HAZY (*)    Ketones, ur 80 (*)    Protein, ur 100 (*)    All other components within normal limits  BLOOD GAS, VENOUS - Abnormal; Notable for the following components:   pCO2, Ven 43 (*)    pO2, Ven 69 (*)    Acid-Base Excess 2.3 (*)    All other components within normal limits  CBG MONITORING, ED - Abnormal; Notable for the following components:   Glucose-Capillary 112 (*)    All other components within normal limits  LIPASE, BLOOD  LACTIC ACID, PLASMA  LACTIC ACID, PLASMA  BETA-HYDROXYBUTYRIC ACID  I-STAT VENOUS BLOOD GAS, ED  CBG MONITORING, ED  I-STAT CG4 LACTIC ACID, ED    EKG: EKG Interpretation Date/Time:  Wednesday June 17 2024 07:25:09 EDT Ventricular Rate:  96 PR Interval:  138 QRS Duration:  79 QT Interval:  342 QTC Calculation: 433 R Axis:   57  Text Interpretation: Sinus rhythm Low voltage, precordial leads Borderline T wave abnormalities no significant change since Oct 2024 Confirmed by Freddi Hamilton [phone]) on 06/17/2024 7:38:52 AM  Radiology: [facility]COLA Foot Complete Right Result Date: 06/17/2024 Please see detailed radiograph report in office note.  CT ABDOMEN PELVIS W CONTRAST Result Date: 06/16/2024 CLINICAL DATA:  Abdominal pain EXAM: CT ABDOMEN AND PELVIS WITH CONTRAST TECHNIQUE: Multidetector CT imaging of the abdomen and pelvis was performed using the standard protocol following bolus administration of intravenous contrast. RADIATION  DOSE REDUCTION: This exam was performed according to the departmental dose-optimization program which includes automated exposure control, adjustment of the mA and/or kV according to patient size and/or use of iterative reconstruction technique. CONTRAST:  OMNIPAQUE  IOHEXOL  350 MG/ML SOLN COMPARISON:  05/26/2023 FINDINGS: Hepatobiliary: Fatty infiltration of the liver is noted. The gallbladder is within normal limits. Pancreas: Unremarkable. No pancreatic ductal dilatation or surrounding inflammatory changes. Spleen: Normal in size without focal abnormality. Adrenals/Urinary Tract: Adrenal glands are unremarkable. Kidneys demonstrate a normal enhancement pattern bilaterally. No renal calculi are seen. No obstructive changes are noted. The bladder is decompressed. Stomach/Bowel: No obstructive or inflammatory changes of the colon are seen. Mild diverticular change is noted without diverticulitis. The appendix is air-filled and limits. Small bowel and stomach are unremarkable. Vascular/Lymphatic: No significant vascular findings are present. No enlarged abdominal or pelvic lymph nodes. Reproductive: Uterus and bilateral adnexa are unremarkable. Other: No abdominal wall hernia or abnormality. No abdominopelvic ascites. Musculoskeletal: No acute or significant osseous findings. IMPRESSION: Fatty liver. Diverticulosis without diverticulitis. Electronically Signed   By: Oneil Devonshire M.D.   On: 06/16/2024 02:11   CT Angio Chest Pulmonary Embolism (PE) W or WO Contrast Result Date: 06/16/2024 CLINICAL DATA:  Chest and abdominal pain EXAM: CT ANGIOGRAPHY CHEST WITH CONTRAST TECHNIQUE: Multidetector CT imaging of the chest was performed using the standard protocol during bolus administration of intravenous contrast. Multiplanar CT image reconstructions and MIPs were obtained to evaluate the vascular anatomy. RADIATION DOSE REDUCTION: This exam was performed according to the departmental dose-optimization program which  includes automated exposure control, adjustment of the mA and/or kV according to patient size and/or use of iterative reconstruction technique. CONTRAST:  OMNIPAQUE  IOHEXOL  350 MG/ML SOLN COMPARISON:  None Available. FINDINGS: Cardiovascular: Thoracic aorta its branches are within normal limits. No cardiac enlargement is seen. Pulmonary artery shows a normal branching pattern bilaterally. No filling defect to suggest pulmonary embolism is noted. Mediastinum/Nodes: Thoracic inlet is within normal limits. No hilar or mediastinal adenopathy is noted. The  esophagus as visualized is within normal limits. Lungs/Pleura: Lungs are well aerated bilaterally. No focal infiltrate or effusion is noted. Tiny subpleural left upper lobe nodule is noted measuring 2-3 mm. No follow-up is recommended. Musculoskeletal: No acute bony abnormality noted. Review of the MIP images confirms the above findings. IMPRESSION: No evidence of pulmonary emboli. Tiny subpleural left upper lobe nodule. No follow-up is recommended. Electronically Signed   By: Oneil Devonshire M.D.   On: 06/16/2024 02:09    Procedures   Medications Ordered in the ED  0.9 %  sodium chloride  infusion (has no administration in time range)  lactated ringers bolus 1,000 mL (0 mLs Intravenous Stopped 06/17/24 0832)  prochlorperazine (COMPAZINE) injection 10 mg (10 mg Intravenous Given 06/17/24 0620)  diphenhydrAMINE  (BENADRYL ) injection 25 mg (25 mg Intravenous Given 06/17/24 0620)  metoCLOPramide  (REGLAN ) injection 10 mg (10 mg Intravenous Given 06/17/24 0839)  morphine  (PF) 4 MG/ML injection 4 mg (4 mg Intravenous Given 06/17/24 0839)  famotidine (PEPCID) IVPB 20 mg premix (0 mg Intravenous Stopped 06/17/24 1103)                                   Medical Decision Making Amount and/or Complexity of Data Reviewed Labs: ordered.  Risk Prescription drug management. Decision regarding hospitalization.   This patient presents to the ED for concern of abdominal  pain, nausea, vomiting, this involves an extensive number of treatment options, and is a complaint that carries with it a high risk of complications and morbidity.  The differential diagnosis includes dehydration, gastritis, medication side effect, bowel ischemia, bowel obstruction, DKA   Co morbidities that complicate the patient evaluation  DM 2, HTN, HLD   Additional history obtained:  Additional history obtained from ED encounter from 06/15/2024 which showed patient had largely reassuring workup with exception of some metabolic derangements.   Lab Tests:  I Ordered, and personally interpreted labs.  The pertinent results include: CBC reassuring with no significant leukocytosis and slightly improved from 2 days prior, CMP without significant electrolyte derangements but CO2 slightly decreased at 19, creatinine elevated 1.04 and anion gap of 18, UA with ketonuria, VBG shows normal pH at 7.41.  Lactic acid normal at 1.2 with repeat at 0.8.  Beta-hydroxybutyrate acid pending.  Appears unremarkable at 33.   Cardiac Monitoring: / EKG:  The patient was maintained on a cardiac monitor.  I personally viewed and interpreted the cardiac monitored which showed an underlying rhythm of: Sinus tachycardia   Consultations Obtained:  I requested consultation with hospitalist,  and discussed lab and imaging findings as well as pertinent plan - they recommend: Spoke with Dr. Sebastian, hospitalist, who will be admitting patient.   Problem List / ED Course / Critical interventions / Medication management  Patient with history of hypertension, hyperlipidemia, type 2 diabetes presents the emergency department concerns of abdominal pain, nausea, vomiting.  Reports that she has had symptoms ongoing for the last several days that she was attributing to possible amoxicillin  side effect.  She is now off the amoxicillin .  She reports that she has been in the emergency department a few days ago and was discharged  home with p.o. medications.  Reports still not tolerating p.o.  Denies any fever, chills, body aches.  No reported diarrhea.  No sick contacts. On exam, patient is ill-appearing but is nontoxic.  She is visibly uncomfortable but no vomiting seen.  Concern for possible DKA given continued  abdominal pain, nausea and vomiting.  Suspect likely no longer experiencing medication side effects that she states that she has been on this medication now for the last day or 2. She is on Ozempic for DM management but states there has been no changes in dose, frequency, or sudden restarting of medication and has not previously had reactions to Ozempic.  I ordered medication including LR, Compazine, Reglan , morphine , Benadryl , famotidine for dehydration, nausea, pain, epigastric discomfort Reevaluation of the patient after these medicines showed that the patient improved I have reviewed the patients home medicines and have made adjustments as needed   Social Determinants of Health:  Recent ED visit   Test / Admission - Considered:  Given patient's lack of clinical improvement, inpatient admission.   Final diagnoses:  Intractable nausea and vomiting  Dehydration    ED Discharge Orders     None          Cecily Legrand LABOR, PA-C 06/17/24 1242    Freddi Hamilton, MD 06/17/24 [phone]

## 2024-06-18 ENCOUNTER — Encounter: Admitting: Podiatry

## 2024-06-18 DIAGNOSIS — E785 Hyperlipidemia, unspecified: Secondary | ICD-10-CM

## 2024-06-18 DIAGNOSIS — I10 Essential (primary) hypertension: Secondary | ICD-10-CM | POA: Diagnosis present

## 2024-06-18 DIAGNOSIS — M329 Systemic lupus erythematosus, unspecified: Secondary | ICD-10-CM | POA: Diagnosis present

## 2024-06-18 DIAGNOSIS — F32A Depression, unspecified: Secondary | ICD-10-CM

## 2024-06-18 DIAGNOSIS — E872 Acidosis, unspecified: Secondary | ICD-10-CM | POA: Diagnosis present

## 2024-06-18 DIAGNOSIS — R1013 Epigastric pain: Secondary | ICD-10-CM | POA: Diagnosis present

## 2024-06-18 DIAGNOSIS — Z79899 Other long term (current) drug therapy: Secondary | ICD-10-CM | POA: Diagnosis not present

## 2024-06-18 DIAGNOSIS — Z888 Allergy status to other drugs, medicaments and biological substances status: Secondary | ICD-10-CM | POA: Diagnosis not present

## 2024-06-18 DIAGNOSIS — Z87891 Personal history of nicotine dependence: Secondary | ICD-10-CM | POA: Diagnosis not present

## 2024-06-18 DIAGNOSIS — M797 Fibromyalgia: Secondary | ICD-10-CM

## 2024-06-18 DIAGNOSIS — E119 Type 2 diabetes mellitus without complications: Secondary | ICD-10-CM

## 2024-06-18 DIAGNOSIS — D509 Iron deficiency anemia, unspecified: Secondary | ICD-10-CM

## 2024-06-18 DIAGNOSIS — Z9889 Other specified postprocedural states: Secondary | ICD-10-CM

## 2024-06-18 DIAGNOSIS — G2581 Restless legs syndrome: Secondary | ICD-10-CM | POA: Diagnosis present

## 2024-06-18 DIAGNOSIS — E860 Dehydration: Secondary | ICD-10-CM

## 2024-06-18 DIAGNOSIS — K5730 Diverticulosis of large intestine without perforation or abscess without bleeding: Secondary | ICD-10-CM | POA: Diagnosis present

## 2024-06-18 DIAGNOSIS — K219 Gastro-esophageal reflux disease without esophagitis: Secondary | ICD-10-CM | POA: Diagnosis present

## 2024-06-18 DIAGNOSIS — Z833 Family history of diabetes mellitus: Secondary | ICD-10-CM | POA: Diagnosis not present

## 2024-06-18 DIAGNOSIS — R112 Nausea with vomiting, unspecified: Secondary | ICD-10-CM | POA: Diagnosis not present

## 2024-06-18 DIAGNOSIS — Z8719 Personal history of other diseases of the digestive system: Secondary | ICD-10-CM

## 2024-06-18 DIAGNOSIS — K760 Fatty (change of) liver, not elsewhere classified: Secondary | ICD-10-CM | POA: Diagnosis present

## 2024-06-18 DIAGNOSIS — Z8249 Family history of ischemic heart disease and other diseases of the circulatory system: Secondary | ICD-10-CM | POA: Diagnosis not present

## 2024-06-18 DIAGNOSIS — Z7985 Long-term (current) use of injectable non-insulin antidiabetic drugs: Secondary | ICD-10-CM | POA: Diagnosis not present

## 2024-06-18 DIAGNOSIS — A084 Viral intestinal infection, unspecified: Secondary | ICD-10-CM | POA: Diagnosis present

## 2024-06-18 DIAGNOSIS — Z823 Family history of stroke: Secondary | ICD-10-CM | POA: Diagnosis not present

## 2024-06-18 DIAGNOSIS — K5900 Constipation, unspecified: Secondary | ICD-10-CM | POA: Diagnosis present

## 2024-06-18 DIAGNOSIS — Z7951 Long term (current) use of inhaled steroids: Secondary | ICD-10-CM | POA: Diagnosis not present

## 2024-06-18 DIAGNOSIS — Z803 Family history of malignant neoplasm of breast: Secondary | ICD-10-CM | POA: Diagnosis not present

## 2024-06-18 LAB — GLUCOSE, CAPILLARY
Glucose-Capillary: 109 mg/dL — ABNORMAL HIGH (ref 70–99)
Glucose-Capillary: 116 mg/dL — ABNORMAL HIGH (ref 70–99)
Glucose-Capillary: 116 mg/dL — ABNORMAL HIGH (ref 70–99)
Glucose-Capillary: 123 mg/dL — ABNORMAL HIGH (ref 70–99)
Glucose-Capillary: 129 mg/dL — ABNORMAL HIGH (ref 70–99)
Glucose-Capillary: 137 mg/dL — ABNORMAL HIGH (ref 70–99)
Glucose-Capillary: 142 mg/dL — ABNORMAL HIGH (ref 70–99)

## 2024-06-18 LAB — COMPREHENSIVE METABOLIC PANEL WITH GFR
ALT: 38 U/L (ref 0–44)
AST: 38 U/L (ref 15–41)
Albumin: 3.7 g/dL (ref 3.5–5.0)
Alkaline Phosphatase: 72 U/L (ref 38–126)
Anion gap: 8 (ref 5–15)
BUN: 6 mg/dL (ref 6–20)
CO2: 24 mmol/L (ref 22–32)
Calcium: 8.4 mg/dL — ABNORMAL LOW (ref 8.9–10.3)
Chloride: 108 mmol/L (ref 98–111)
Creatinine, Ser: 0.77 mg/dL (ref 0.44–1.00)
GFR, Estimated: >60 mL/min (ref >60)
Glucose, Bld: 119 mg/dL — ABNORMAL HIGH (ref 70–99)
Potassium: 3.5 mmol/L (ref 3.5–5.1)
Sodium: 140 mmol/L (ref 135–145)
Total Bilirubin: 0.8 mg/dL (ref 0.0–1.2)
Total Protein: 7.4 g/dL (ref 6.5–8.1)

## 2024-06-18 LAB — CBC
HCT: 40 % (ref 36.0–46.0)
Hemoglobin: 12.6 g/dL (ref 12.0–15.0)
MCH: 27 pg (ref 26.0–34.0)
MCHC: 31.5 g/dL (ref 30.0–36.0)
MCV: 85.7 fL (ref 80.0–100.0)
Platelets: 275 10*3/uL (ref 150–400)
RBC: 4.67 MIL/uL (ref 3.87–5.11)
RDW: 15.7 % — ABNORMAL HIGH (ref 11.5–15.5)
WBC: 7 10*3/uL (ref 4.0–10.5)
nRBC: 0 % (ref 0.0–0.2)

## 2024-06-18 LAB — HEMOGLOBIN A1C
Hgb A1c MFr Bld: 6.2 % — ABNORMAL HIGH (ref 4.8–5.6)
Mean Plasma Glucose: 131 mg/dL

## 2024-06-18 LAB — MAGNESIUM: Magnesium: 2 mg/dL (ref 1.7–2.4)

## 2024-06-18 MED ORDER — AMLODIPINE BESYLATE 10 MG PO TABS
10.0000 mg | ORAL_TABLET | Freq: Every day | ORAL | Status: DC
  Administered 2024-06-18 – 2024-06-19 (×2): 10 mg via ORAL
  Filled 2024-06-18 (×2): qty 1

## 2024-06-18 MED ORDER — POTASSIUM CHLORIDE CRYS ER 10 MEQ PO TBCR
40.0000 meq | EXTENDED_RELEASE_TABLET | Freq: Once | ORAL | Status: AC
  Administered 2024-06-18: 40 meq via ORAL
  Filled 2024-06-18: qty 4

## 2024-06-18 MED ORDER — PANTOPRAZOLE SODIUM 40 MG PO TBEC
40.0000 mg | DELAYED_RELEASE_TABLET | Freq: Two times a day (BID) | ORAL | Status: DC
  Administered 2024-06-18 – 2024-06-19 (×2): 40 mg via ORAL
  Filled 2024-06-18 (×2): qty 1

## 2024-06-18 MED ORDER — SODIUM CHLORIDE 0.9 % IV SOLN: INTRAVENOUS | Status: DC

## 2024-06-18 NOTE — Evaluation (Signed)
 Physical Therapy Evaluation Patient Details Name: [name] MRN: [id number] DOB: [birth year] Today's Date: 06/18/2024  History of Present Illness  [name] is a 53 y.o. female admitted 06/17/24 with medical history significant of hypertension, diabetes mellitus type 2, osteoarthritis, fibromyalgia, lupus, depression, ADHD, hyperlipidemia presenting to the ED with a 3-day history of intractable nausea vomiting and upper abdominal/epigastric pain.  Clinical Impression  Pt admitted with above diagnosis. Pt agreeable to PT consult, states that she lives at home with her dtr who assists with home upkeep. She has RW at home, would benefit from adaptive equipment for bathroom, see OT note for recs. Pt able to amb x50ft with RW at CGA-supervision A, completes bed<>chair transfers at supervision A. Recommend PT 3 times per week at dc. Pt currently with functional limitations due to the deficits listed below (see PT Problem List). Pt will benefit from acute skilled PT to increase their independence and safety with mobility to allow discharge.           If plan is discharge home, recommend the following: Help with stairs or ramp for entrance;Assistance with cooking/housework;A little help with bathing/dressing/bathroom;A little help with walking and/or transfers   Can travel by private vehicle        Equipment Recommendations Other (comment) (see OT note for AE recommendations)  Recommendations for Other Services       Functional Status Assessment Patient has had a recent decline in their functional status and demonstrates the ability to make significant improvements in function in a reasonable and predictable amount of time.     Precautions / Restrictions Precautions Precautions: Fall Required Braces or Orthoses: Other Brace Other Brace: DARCO shoe R Restrictions Weight Bearing Restrictions Per Provider Order: No      Mobility  Bed Mobility Overal bed mobility:  Modified Independent             General bed mobility comments: inc time, bed rails, no physical assist    Transfers Overall transfer level: Needs assistance Equipment used: Rolling walker (2 wheels) Transfers: Sit to/from Stand, Bed to chair/wheelchair/BSC Sit to Stand: Supervision   Step pivot transfers: Supervision            Ambulation/Gait Ambulation/Gait assistance: Supervision Gait Distance (Feet): 50 Feet Assistive device: Rolling walker (2 wheels) Gait Pattern/deviations: Step-to pattern, Antalgic Gait velocity: dec     General Gait Details: Pt uses step to pattern with RW, able to complete 50 ft at sup A, good directional changes, no LOB, progressing activty tolerance  Stairs Stairs:  (pt demonstrates good strength on LLE adequate for stair negotitation with dtr able to assist, gait belt provided)          Wheelchair Mobility     Tilt Bed    Modified Rankin (Stroke Patients Only)       Balance Overall balance assessment: Needs assistance Sitting-balance support: Feet supported, No upper extremity supported Sitting balance-Leahy Scale: Good     Standing balance support: Single extremity supported Standing balance-Leahy Scale: Good                               Pertinent Vitals/Pain Pain Assessment Pain Assessment: Faces Faces Pain Scale: Hurts little more Pain Location: R medial foot with weigth bearing Pain Descriptors / Indicators: Aching, Discomfort Pain Intervention(s): Limited activity within patient's tolerance, Monitored during session, Repositioned, Ice applied    Home Living Family/patient expects to be discharged to:: Private residence  Living Arrangements: Children Available Help at Discharge: Available 24 hours/day Type of Home: Apartment Home Access: Stairs to enter Entrance Stairs-Rails: Right Entrance Stairs-Number of Steps: 5 (2 sets of 5 steps (up 5 then down 5))   Home Layout: One level Home  Equipment: Agricultural consultant (2 wheels);Other (comment) Additional Comments: knee scooter    Prior Function Prior Level of Function : Needs assist               ADLs Comments: dtr assists with household responsibilities     Extremity/Trunk Assessment   Upper Extremity Assessment Upper Extremity Assessment: Overall WFL for tasks assessed    Lower Extremity Assessment Lower Extremity Assessment: Generalized weakness;RLE deficits/detail RLE Deficits / Details: decreased strength and limited tolerance to prolonged activity       Communication   Communication Communication: No apparent difficulties    Cognition Arousal: Alert Behavior During Therapy: WFL for tasks assessed/performed   PT - Cognitive impairments: No apparent impairments                         Following commands: Intact       Cueing Cueing Techniques: Verbal cues, Gestural cues     General Comments General comments (skin integrity, edema, etc.): DARCO shoe RLE    Exercises     Assessment/Plan    PT Assessment Patient needs continued PT services  PT Problem List Decreased strength;Decreased balance;Decreased mobility;Decreased activity tolerance       PT Treatment Interventions DME instruction;Functional mobility training;Balance training;Patient/family education;Therapeutic activities;Gait training;Stair training;Therapeutic exercise    PT Goals (Current goals can be found in the Care Plan section)  Acute Rehab PT Goals Patient Stated Goal: improve activity tolerance PT Goal Formulation: With patient Time For Goal Achievement: 07/02/24 Potential to Achieve Goals: Good    Frequency Min 3X/week     Co-evaluation               AM-PAC PT 6 Clicks Mobility  Outcome Measure Help needed turning from your back to your side while in a flat bed without using bedrails?: A Little Help needed moving from lying on your back to sitting on the side of a flat bed without using  bedrails?: A Little Help needed moving to and from a bed to a chair (including a wheelchair)?: A Little Help needed standing up from a chair using your arms (e.g., wheelchair or bedside chair)?: A Little Help needed to walk in hospital room?: A Little Help needed climbing 3-5 steps with a railing? : A Little 6 Click Score: 18    End of Session Equipment Utilized During Treatment: Gait belt Activity Tolerance: Patient limited by pain Patient left: in chair Nurse Communication: Mobility status PT Visit Diagnosis: Difficulty in walking, not elsewhere classified (R26.2);Pain Pain - Right/Left: Right Pain - part of body: Ankle and joints of foot    Time: 1010-1045 PT Time Calculation (min) (ACUTE ONLY): 35 min   Charges:   PT Evaluation $PT Eval Low Complexity: 1 Low PT Treatments $Gait Training: 8-22 mins PT General Charges $$ ACUTE PT VISIT: 1 Visit         Stann, PT Acute Rehabilitation Services Office: [phone] 06/18/2024   Stann [name] 06/18/2024, 11:19 AM

## 2024-06-18 NOTE — TOC Initial Note (Signed)
 Transition of Care [facility]) - Initial/Assessment Note    Patient Details  Name: [name] MRN: [id number] Date of Birth: Dec 26, 1970  [facility]) CM/SW Contact:    Sheri ONEIDA Sharps, LCSW Phone Number: 06/18/2024, 2:46 PM  Clinical Narrative:                 Pt from home with dtr. Pt recommended for HHPT. HHPT setup w/ [facility]. DME 3 n1 ordered from [employer]. TOC following for additional dc needs.  Expected Discharge Plan: Home w Home Health Services Barriers to Discharge: Continued Medical Work up   Patient Goals and CMS Choice Patient states their goals for this hospitalization and ongoing recovery are:: return home CMS Medicare.gov Compare Post Acute Care list provided to::  (NA) Choice offered to / list presented to : NA [place] ownership interest in [facility].provided to::  (NA)    Expected Discharge Plan and Services In-house Referral: NA Discharge Planning Services: NA   Living arrangements for the past 2 months: Apartment                 DME Arranged: 3-N-1 DME Agency: [employer] Date DME Agency Contacted: 06/18/24 Time DME Agency Contacted: 1445 Representative spoke with at DME Agency: [place] HH Arranged: PT HH Agency: [facility] Date [facility] Agency Contacted: 06/18/24 Time HH Agency Contacted: 1445 Representative spoke with at [facility] Agency: Cindie  Prior Living Arrangements/Services Living arrangements for the past 2 months: Apartment Lives with:: Minor Children Patient language and need for interpreter reviewed:: Yes Do you feel safe going back to the place where you live?: Yes      Need for Family Participation in Patient Care: Yes (Comment) Care giver support system in place?: Yes (comment)   Criminal Activity/Legal Involvement Pertinent to Current Situation/Hospitalization: No - Comment as needed  Activities of Daily Living   ADL Screening (condition at time of admission) Independently performs  ADLs?: Yes (appropriate for developmental age) Is the patient deaf or have difficulty hearing?: No Does the patient have difficulty seeing, even when wearing glasses/contacts?: No Does the patient have difficulty concentrating, remembering, or making decisions?: No  Permission Sought/Granted                  Emotional Assessment Appearance:: Appears stated age Attitude/Demeanor/Rapport: Engaged Affect (typically observed): Accepting Orientation: : Oriented to Self, Oriented to Place, Oriented to Situation, Oriented to  Time Alcohol / Substance Use: Not Applicable Psych Involvement: No (comment)  Admission diagnosis:  Dehydration [E86.0] Nausea & vomiting [R11.2] Intractable nausea and vomiting [R11.2] Patient Active Problem List   Diagnosis Date Noted   Nausea & vomiting 06/17/2024   HTN (hypertension) 06/17/2024   Hyperlipidemia 06/17/2024   DM (diabetes mellitus), type 2 (HCC) 06/17/2024   Dehydration 06/17/2024   Constipation 06/17/2024   S/P bunionectomy 06/17/2024   Depression 06/17/2024   Regurgitation of food 01/23/2023   Fibromyalgia 12/25/2016   Other fatigue 12/25/2016   Primary insomnia 12/25/2016   Primary osteoarthritis of both knees 12/25/2016   ANA positive 12/25/2016   History of gastroesophageal reflux (GERD) 12/25/2016   History of anemia 12/25/2016   Trochanteric bursitis of both hips 12/25/2016   Anemia, iron deficiency 04/18/2015   Dysphagia, pharyngoesophageal phase 11/14/2011   Esophageal reflux 11/14/2011   PCP:  Sun, Vyvyan, MD Pharmacy:   [facility] [address], [place] - [address] [place] [address] Phone: [phone] Fax: [phone]  Social Drivers of Health (SDOH) Social History: SDOH Screenings   Food Insecurity: No Food Insecurity (06/17/2024)  Housing: Low Risk  (06/17/2024)  Transportation Needs: No Transportation Needs (06/17/2024)  Utilities: Not At Risk (06/17/2024)  Depression  (PHQ2-9): Low Risk  (07/10/2022)  Tobacco Use: Medium Risk (06/17/2024)   SDOH Interventions:     Readmission Risk Interventions     No data to display

## 2024-06-18 NOTE — Progress Notes (Signed)
 PROGRESS NOTE    [name]  FMW:[id number] DOB: September 01, 1971 DOA: 06/17/2024 PCP: Sun, Vyvyan, MD    Chief Complaint  Patient presents with   Abdominal Pain    Brief Narrative:  Patient pleasant 53 year old female history of hypertension, type 2 diabetes, osteoarthritis, fibromyalgia, lupus, depression, ADHD, hyperlipidemia presented to the ED 3-day history of intractable nausea vomiting upper abdominal/epigastric pain.  Patient noted to be dehydrated on admission.  Patient placed on IV fluids, supportive care, bowel rest.   Assessment & Plan:   Principal Problem:   Nausea & vomiting Active Problems:   Esophageal reflux   Anemia, iron deficiency   Fibromyalgia   History of gastroesophageal reflux (GERD)   HTN (hypertension)   Hyperlipidemia   DM (diabetes mellitus), type 2 (HCC)   Dehydration   Constipation   S/P bunionectomy   Depression  #1 nausea vomiting epigastric abdominal pain -??  Etiology. -??  Viral gastroenteritis versus antibiotic induced however patient does state tolerating oral antibiotics for 4 days prior to onset of her symptoms. - Patient seen in the ED  06/16/2024 and underwent CT abdomen and pelvis which was unremarkable except noting a fatty liver and diverticulosis without diverticulitis. - Lipase within normal limits. - Patient noted to have undergone upper endoscopy 07/04/2020 which showed a normal esophagus, congestive gastropathy, normal duodenum with biopsies showing positive H. pylori, chronic active gastritis. - Clinical improvement with no further nausea or vomiting.   - Patient tolerated trial of clears this morning.   - Advance diet to a full liquid diet and if tolerates will advance to a soft diet in the morning.   - Continue PPI every 12 hours, IV antiemetics, IV pain medication, IV fluids, supportive care.   2.  GERD - Transition from IV PPI to oral PPI.     3.  Type 2 diabetes mellitus - Hemoglobin A1c pending.  -CBG noted at  116 this morning. - Patient noted to be on Ozempic prior to admission which we will continue to hold for now.   - SSI.   4.  Dehydration - Improved with hydration.    5.  Status post bunionectomy - Patient noted to have a bunionectomy done 8 days prior to admission noted to have completed 4 days of oral antibiotics prior to onset of symptoms with intractable nausea and vomiting unable to keep anything down. - Continue IV Unasyn for 2 more days to complete an empiric 7-day course of antibiotic treatment. - Patient states saw podiatrist 1 day prior to admission and will be following up with podiatry in 2 weeks. - PT/OT. - Outpatient follow-up with podiatry.   6.  Constipation -Patient states has not had a bowel movement for over 2 weeks. - Patient noted to be on opiate pain medication per med rec likely drug induced. - Dulcolax suppository daily ordered however patient noted to be refusing. -Patient place on MiraLAX twice daily, senna: S twice daily with good results. -Outpatient follow-up.   7.  Sinus tachycardia -Likely secondary to problem #1 in the setting of dehydration. - EKG with no ischemic changes noted. - Improved with IV fluids and IV Lopressor.     8.  Hypertension -BP elevated with systolics in the 170s secondary to inability to keep medications down due to 3-day history of nausea vomiting and epigastric abdominal pain. - Continue Lopressor 5 mg IV every 8 hours.  - Resume home regimen Norvasc 10 mg daily.   - Continue to hold Cozaar.   9.  Depression/fibromyalgia - Continue home regimen Wellbutrin, Lexapro.   10.  History of lupus - Continue home regimen Plaquenil .       DVT prophylaxis: Lovenox Code Status: Full Family Communication: Updated patient.  No family at bedside. Disposition: Home when clinically improved, tolerating a solid diet hopefully in the next 24 hours.  Status is: Observation The patient remains OBS appropriate and will d/c before 2  midnights.   Consultants:  None  Procedures:  None  Antimicrobials:  Anti-infectives (From admission, onward)    Start     Dose/Rate Route Frequency Ordered Stop   06/18/24 1000  hydroxychloroquine  (PLAQUENIL ) tablet 200 mg       Note to Pharmacy: TAKE 1 TABLET BY MOUTH TWICE DAILY MONDAY THROUGH FRIDAY ONLY (NONE ON SATURDAY OR SUNDAY)     200 mg Oral Daily 06/17/24 1424     07 /02/25 1800  Ampicillin-Sulbactam (UNASYN) 3 g in sodium chloride  0.9 % 100 mL IVPB        3 g 200 mL/hr over 30 Minutes Intravenous Every 6 hours 06/17/24 1509 06/20/24 1759   06/17/24 1515  Ampicillin-Sulbactam (UNASYN) 3 g in sodium chloride  0.9 % 100 mL IVPB  Status:  Discontinued        3 g 200 mL/hr over 30 Minutes Intravenous Every 6 hours 06/17/24 1509 06/17/24 1509         Subjective: Patient laying in bed.  States she feels significantly better than she did on admission.  Denies any further nausea or vomiting.  No abdominal pain.  Denies any chest pain.  Tolerating clear liquids.  Objective: Vitals:   06/18/24 0004 06/18/24 0403 06/18/24 0600 06/18/24 0926  BP: (!) 165/98 (!) 166/88  (!) 171/101  Pulse: 92 (!) 102    Resp: 16 16 17    Temp: 98.4 F (36.9 C) 98.3 F (36.8 C)    TempSrc: Oral Oral    SpO2: 100% 99%    Weight:      Height:        Intake/Output Summary (Last 24 hours) at 06/18/2024 1026 Last data filed at 06/18/2024 0200 Gross per 24 hour  Intake 1406.18 ml  Output --  Net 1406.18 ml   Filed Weights   06/17/24 0534  Weight: 99.8 kg    Examination:  General exam: Appears calm and comfortable  Respiratory system: Clear to auscultation.  No wheezes, no crackles, no rhonchi.  Fair air movement.  Speaking in full sentences.  Respiratory effort normal. Cardiovascular system: S1 & S2 heard, RRR. No JVD, murmurs, rubs, gallops or clicks. No pedal edema. Gastrointestinal system: Abdomen is nondistended, soft and nontender. No organomegaly or masses felt. Normal bowel sounds  heard. Central nervous system: Alert and oriented. No focal neurological deficits. Extremities: Symmetric 5 x 5 power. Skin: No rashes, lesions or ulcers Psychiatry: Judgement and insight appear normal. Mood & affect appropriate.     Data Reviewed: I have personally reviewed following labs and imaging studies  CBC: Recent Labs  Lab 06/16/24 0010 06/17/24 0600 06/18/24 0527  WBC 9.2 7.7 7.0  NEUTROABS  --  3.6  --   HGB 15.0 13.7 12.6  HCT 45.5 42.9 40.0  MCV 82.4 84.4 85.7  PLT 436* 395 275    Basic Metabolic Panel: Recent Labs  Lab 06/16/24 0010 06/16/24 0453 06/17/24 0600 06/18/24 0527  NA 141 137 138 140  K 3.5 3.0* 3.6 3.5  CL 104 103 101 108  CO2 17* 22 19* 24  GLUCOSE 203* 111* 193*  119*  BUN 11 10 10 6   CREATININE 0.93 0.85 1.04* 0.77  CALCIUM  9.4 8.4* 9.3 8.4*  MG  --   --   --  2.0    GFR: Estimated Creatinine Clearance: 95.8 mL/min (by C-G formula based on SCr of 0.77 mg/dL).  Liver Function Tests: Recent Labs  Lab 06/16/24 0010 06/17/24 0600 06/18/24 0527  AST 39 37 38  ALT 34 35 38  ALKPHOS 94 84 72  BILITOT 1.0 1.2 0.8  PROT 8.6* 8.5* 7.4  ALBUMIN 4.5 4.3 3.7    CBG: Recent Labs  Lab 06/17/24 1644 06/17/24 1939 06/18/24 0003 06/18/24 0404 06/18/24 0731  GLUCAP 146* 136* 142* 129* 116*     No results found for this or any previous visit (from the past 240 hours).       Radiology Studies: DG Foot Complete Right Result Date: 06/17/2024 Please see detailed radiograph report in office note.       Scheduled Meds:  amLODipine  10 mg Oral Daily   amphetamine-dextroamphetamine  10 mg Oral Q breakfast   bisacodyl  10 mg Rectal Daily   buPROPion  150 mg Oral q morning   enoxaparin (LOVENOX) injection  40 mg Subcutaneous Q24H   escitalopram  20 mg Oral QHS   fluticasone  furoate-vilanterol  1 puff Inhalation Daily   hydroxychloroquine   200 mg Oral Daily   insulin  aspart  0-9 Units Subcutaneous Q4H   linaclotide   72 mcg  Oral QODAY   metoprolol tartrate  5 mg Intravenous Q8H   pantoprazole  (PROTONIX ) IV  40 mg Intravenous Q12H   polyethylene glycol  17 g Oral BID   senna-docusate  1 tablet Oral BID   sodium chloride  flush  3 mL Intravenous Q12H   Continuous Infusions:  sodium chloride  125 mL/hr at 06/18/24 0606   ampicillin-sulbactam (UNASYN) IV 3 g (06/18/24 0608)     LOS: 0 days    Time spent: 35 minutes    Toribio Hummer, MD [facility]   To contact the attending provider between 7A-7P or the covering provider during after hours 7P-7A, please log into the web site [email or web address] and access using universal [place] password for that web site. If you do not have the password, please call the hospital operator.  06/18/2024, 10:26 AM

## 2024-06-18 NOTE — Care Management Obs Status (Signed)
 MEDICARE OBSERVATION STATUS NOTIFICATION   Patient Details  Name: [name] MRN: [id number] Date of Birth: [birth year]   Medicare Observation Status Notification Given:  Yes    Sheri ONEIDA Sharps, LCSW 06/18/2024, 3:31 PM

## 2024-06-18 NOTE — Plan of Care (Signed)
  Problem: Metabolic: Goal: Ability to maintain appropriate glucose levels will improve 06/18/2024 0611 by Leontine Bone, RN Outcome: Progressing 06/18/2024 0611 by Leontine Bone, RN Outcome: Progressing   Problem: Clinical Measurements: Goal: Ability to maintain clinical measurements within normal limits will improve 06/18/2024 0611 by Leontine Bone, RN Outcome: Progressing 06/18/2024 0611 by Leontine Bone, RN Outcome: Progressing Goal: Will remain free from infection 06/18/2024 0611 by Leontine Bone, RN Outcome: Progressing 06/18/2024 0611 by Leontine Bone, RN Outcome: Progressing Goal: Diagnostic test results will improve 06/18/2024 0611 by Leontine Bone, RN Outcome: Progressing 06/18/2024 0611 by Leontine Bone, RN Outcome: Progressing Goal: Respiratory complications will improve 06/18/2024 0611 by Leontine Bone, RN Outcome: Progressing 06/18/2024 0611 by Leontine Bone, RN Outcome: Progressing Goal: Cardiovascular complication will be avoided 06/18/2024 0611 by Leontine Bone, RN Outcome: Progressing 06/18/2024 0611 by Leontine Bone, RN Outcome: Progressing   Problem: Elimination: Goal: Will not experience complications related to bowel motility Outcome: Progressing

## 2024-06-18 NOTE — Evaluation (Signed)
 Occupational Therapy Evaluation/Discharge Patient Details Name: [name] MRN: [id number] DOB: 04/03/1971 Today's Date: 06/18/2024   History of Present Illness   [name] is a 53 y.o. female admitted 06/17/24 with medical history significant of hypertension, diabetes mellitus type 2, osteoarthritis, fibromyalgia, lupus, depression, ADHD, hyperlipidemia presenting to the ED with a 3-day history of intractable nausea vomiting and upper abdominal/epigastric pain. Pt recently underwent R foot bunionectomy 06/09/24.     Clinical Impressions Patient evaluated by Occupational Therapy with no further acute OT needs identified. All education has been completed and the patient has no further questions. Pt/family education provided on bathroom DME (see below). Pt provided with handout for purchasing TTB online. Secure chat sent to case management/ SW for [facility] need. No follow-up Occupational Therapy needs. OT is signing off. Thank you for this referral.  Patient is confined to a single room and not able to walk the distance required to go the bathroom, or he/she is unable to safely negotiate stairs required to access the bathroom.  A 3in1 BSC will alleviate this problem       If plan is discharge home, recommend the following:   A little help with walking and/or transfers;A little help with bathing/dressing/bathroom;Help with stairs or ramp for entrance     Functional Status Assessment   Patient has had a recent decline in their functional status and demonstrates the ability to make significant improvements in function in a reasonable and predictable amount of time.     Equipment Recommendations   BSC/3in1;Tub/shower bench (Pt educated on how to purchase TTB online)      Precautions/Restrictions   Precautions Precautions: Fall Recall of Precautions/Restrictions: Intact Required Braces or Orthoses: Other Brace Other Brace: [employer] shoe R Restrictions Weight  Bearing Restrictions Per Provider Order: No RLE Weight Bearing Per Provider Order: Weight bearing as tolerated               Pertinent Vitals/Pain Pain Assessment Pain Assessment: Faces Faces Pain Scale: No hurt (While seated in recliner)     Extremity/Trunk Assessment Upper Extremity Assessment Upper Extremity Assessment: Overall WFL for tasks assessed   Lower Extremity Assessment Lower Extremity Assessment: Defer to PT evaluation        Communication Communication Communication: No apparent difficulties   Cognition Arousal: Alert Behavior During Therapy: WFL for tasks assessed/performed Cognition: No apparent impairments  Following commands: Intact       Cueing  General Comments   Cueing Techniques: Verbal cues  D[employer] shoe RLE           Home Living Family/patient expects to be discharged to:: Private residence Living Arrangements: Children (daughter) Available Help at Discharge: Available 24 hours/day;Family Type of Home: Apartment Home Access: Stairs to enter [employer] of Steps: 2 sets of 5 steps Entrance Stairs-Rails: Right Home Layout: One level     Bathroom Shower/Tub: Chief Strategy Officer: Standard (Pt reports that seat is lower than normal) Bathroom Accessibility: No   Home Equipment: Agricultural consultant (2 wheels);Other (comment) (knee scooter)   Additional Comments: knee scooter      Prior Functioning/Environment Prior Level of Function : Needs assist    ADLs Comments: Daughter assists with IADL tasks such as housekeeping tasks.    OT Problem List: Impaired balance (sitting and/or standing);Decreased knowledge of use of DME or AE        OT Goals(Current goals can be found in the care plan section)   Acute Rehab OT Goals OT Goal Formulation: All  assessment and education complete, DC therapy   OT Frequency:   1X visit       AM-PAC OT 6 Clicks Daily Activity     Outcome Measure Help from another  person eating meals?: None Help from another person taking care of personal grooming?: None Help from another person toileting, which includes using toliet, bedpan, or urinal?: A Little Help from another person bathing (including washing, rinsing, drying)?: A Little Help from another person to put on and taking off regular upper body clothing?: None Help from another person to put on and taking off regular lower body clothing?: A Little 6 Click Score: 21   End of Session    Activity Tolerance: Patient tolerated treatment well Patient left: in chair;with call bell/phone within reach;with chair alarm set;with family/visitor present  OT Visit Diagnosis: Muscle weakness (generalized) (M62.81)                Time: 1134-1150 OT Time Calculation (min): 16 min Charges:  OT General Charges $OT Visit: 1 Visit OT Evaluation $OT Eval Low Complexity: 1 Low  [employer], OTR/L,CBIS  Supplemental OT - [facility] and [facility] Secure Chat Preferred    Drakkar Medeiros, Leita BIRCH 06/18/2024, 12:05 PM

## 2024-06-19 DIAGNOSIS — I10 Essential (primary) hypertension: Secondary | ICD-10-CM | POA: Diagnosis not present

## 2024-06-19 DIAGNOSIS — R112 Nausea with vomiting, unspecified: Secondary | ICD-10-CM | POA: Diagnosis not present

## 2024-06-19 DIAGNOSIS — F32A Depression, unspecified: Secondary | ICD-10-CM | POA: Diagnosis not present

## 2024-06-19 DIAGNOSIS — E860 Dehydration: Secondary | ICD-10-CM | POA: Diagnosis not present

## 2024-06-19 LAB — BASIC METABOLIC PANEL WITH GFR
Anion gap: 14 (ref 5–15)
BUN: <5 mg/dL — ABNORMAL LOW (ref 6–20)
CO2: 22 mmol/L (ref 22–32)
Calcium: 8.8 mg/dL — ABNORMAL LOW (ref 8.9–10.3)
Chloride: 102 mmol/L (ref 98–111)
Creatinine, Ser: 0.74 mg/dL (ref 0.44–1.00)
GFR, Estimated: >60 mL/min (ref >60)
Glucose, Bld: 113 mg/dL — ABNORMAL HIGH (ref 70–99)
Potassium: 3.4 mmol/L — ABNORMAL LOW (ref 3.5–5.1)
Sodium: 138 mmol/L (ref 135–145)

## 2024-06-19 LAB — GLUCOSE, CAPILLARY
Glucose-Capillary: 110 mg/dL — ABNORMAL HIGH (ref 70–99)
Glucose-Capillary: 106 mg/dL — ABNORMAL HIGH (ref 70–99)
Glucose-Capillary: 113 mg/dL — ABNORMAL HIGH (ref 70–99)

## 2024-06-19 MED ORDER — ACETAMINOPHEN 325 MG PO TABS: 650.0000 mg | ORAL_TABLET | Freq: Four times a day (QID) | ORAL | Status: AC | PRN

## 2024-06-19 MED ORDER — POTASSIUM CHLORIDE CRYS ER 20 MEQ PO TBCR
40.0000 meq | EXTENDED_RELEASE_TABLET | Freq: Once | ORAL | Status: AC
  Administered 2024-06-19: 40 meq via ORAL
  Filled 2024-06-19: qty 2

## 2024-06-19 MED ORDER — POLYETHYLENE GLYCOL 3350 17 G PO PACK: 17.0000 g | PACK | Freq: Every day | ORAL | Status: AC | PRN

## 2024-06-19 MED ORDER — LOSARTAN POTASSIUM 50 MG PO TABS
50.0000 mg | ORAL_TABLET | Freq: Every day | ORAL | Status: DC
  Administered 2024-06-19: 50 mg via ORAL
  Filled 2024-06-19: qty 1

## 2024-06-19 NOTE — Plan of Care (Signed)
  Problem: Metabolic: Goal: Ability to maintain appropriate glucose levels will improve Outcome: Progressing   Problem: Tissue Perfusion: Goal: Adequacy of tissue perfusion will improve Outcome: Progressing   Problem: Clinical Measurements: Goal: Ability to maintain clinical measurements within normal limits will improve Outcome: Progressing Goal: Diagnostic test results will improve Outcome: Progressing Goal: Respiratory complications will improve Outcome: Progressing Goal: Cardiovascular complication will be avoided Outcome: Progressing   Problem: Elimination: Goal: Will not experience complications related to bowel motility Outcome: Progressing

## 2024-06-19 NOTE — Progress Notes (Incomplete)
 PROGRESS NOTE    [name]  FMW:[id number] DOB: [birth year] DOA: 06/17/2024 PCP: Sun, Vyvyan, MD    Chief Complaint  Patient presents with   Abdominal Pain    Brief Narrative:  Patient pleasant 52 year old female history of hypertension, type 2 diabetes, osteoarthritis, fibromyalgia, lupus, depression, ADHD, hyperlipidemia presented to the ED 3-day history of intractable nausea vomiting upper abdominal/epigastric pain.  Patient noted to be dehydrated on admission.  Patient placed on IV fluids, supportive care, bowel rest.   Assessment & Plan:   Principal Problem:   Nausea & vomiting Active Problems:   Esophageal reflux   Anemia, iron deficiency   Fibromyalgia   History of gastroesophageal reflux (GERD)   HTN (hypertension)   Hyperlipidemia   DM (diabetes mellitus), type 2 (HCC)   Dehydration   Constipation   S/P bunionectomy   Depression  #1 nausea vomiting epigastric abdominal pain -??  Etiology. -??  Viral gastroenteritis versus antibiotic induced however patient does state tolerating oral antibiotics for 4 days prior to onset of her symptoms. - Patient seen in the ED  06/16/2024 and underwent CT abdomen and pelvis which was unremarkable except noting a fatty liver and diverticulosis without diverticulitis. - Lipase within normal limits. - Patient noted to have undergone upper endoscopy 07/04/2020 which showed a normal esophagus, congestive gastropathy, normal duodenum with biopsies showing positive H. pylori, chronic active gastritis. - Clinical improvement with no further nausea or vomiting.   - Patient tolerated trial of clears this morning.   - Advance diet to a full liquid diet and if tolerates will advance to a soft diet in the morning.   - Continue PPI every 12 hours, IV antiemetics, IV pain medication, IV fluids, supportive care.   2.  GERD - Transition from IV PPI to oral PPI.     3.  Type 2 diabetes mellitus - Hemoglobin A1c pending.  -CBG noted at  116 this morning. - Patient noted to be on Ozempic prior to admission which we will continue to hold for now.   - SSI.   4.  Dehydration - Improved with hydration.    5.  Status post bunionectomy - Patient noted to have a bunionectomy done 8 days prior to admission noted to have completed 4 days of oral antibiotics prior to onset of symptoms with intractable nausea and vomiting unable to keep anything down. - Continue IV Unasyn  for 2 more days to complete an empiric 7-day course of antibiotic treatment. - Patient states saw podiatrist 1 day prior to admission and will be following up with podiatry in 2 weeks. - PT/OT. - Outpatient follow-up with podiatry.   6.  Constipation -Patient states has not had a bowel movement for over 2 weeks. - Patient noted to be on opiate pain medication per med rec likely drug induced. - Dulcolax suppository daily ordered however patient noted to be refusing. -Patient place on MiraLAX  twice daily, senna: S twice daily with good results. -Outpatient follow-up.   7.  Sinus tachycardia -Likely secondary to problem #1 in the setting of dehydration. - EKG with no ischemic changes noted. - Improved with IV fluids and IV Lopressor .     8.  Hypertension -BP elevated with systolics in the 170s secondary to inability to keep medications down due to 3-day history of nausea vomiting and epigastric abdominal pain. - Continue Lopressor  5 mg IV every 8 hours.  - Resume home regimen Norvasc  10 mg daily.   - Continue to hold Cozaar .   9.  Depression/fibromyalgia - Continue home regimen Wellbutrin , Lexapro .   10.  History of lupus - Continue home regimen Plaquenil .       DVT prophylaxis: Lovenox  Code Status: Full Family Communication: Updated patient.  No family at bedside. Disposition: Home when clinically improved, tolerating a solid diet hopefully in the next 24 hours.  Status is: Observation The patient remains OBS appropriate and will d/c before 2  midnights.   Consultants:  None  Procedures:  None  Antimicrobials:  Anti-infectives (From admission, onward)    Start     Dose/Rate Route Frequency Ordered Stop   06/18/24 1000  hydroxychloroquine  (PLAQUENIL ) tablet 200 mg       Note to Pharmacy: TAKE 1 TABLET BY MOUTH TWICE DAILY MONDAY THROUGH FRIDAY ONLY (NONE ON SATURDAY OR SUNDAY)     200 mg Oral Daily 06/17/24 1424     07 /02/25 1800  Ampicillin -Sulbactam (UNASYN ) 3 g in sodium chloride  0.9 % 100 mL IVPB        3 g 200 mL/hr over 30 Minutes Intravenous Every 6 hours 06/17/24 1509 06/20/24 1759   06/17/24 1515  Ampicillin -Sulbactam (UNASYN ) 3 g in sodium chloride  0.9 % 100 mL IVPB  Status:  Discontinued        3 g 200 mL/hr over 30 Minutes Intravenous Every 6 hours 06/17/24 1509 06/17/24 1509         Subjective: Patient laying in bed.  States she feels significantly better than she did on admission.  Denies any further nausea or vomiting.  No abdominal pain.  Denies any chest pain.  Tolerating clear liquids.  Objective: Vitals:   06/18/24 1938 06/19/24 0422 06/19/24 0640 06/19/24 0929  BP: (!) 164/93 (!) 171/87 (!) 152/88 (!) 151/79  Pulse: 72 71  79  Resp: 20 20    Temp: 97.9 F (36.6 C) 98 F (36.7 C)    TempSrc: Oral Oral    SpO2: 100% 100%    Weight:      Height:        Intake/Output Summary (Last 24 hours) at 06/19/2024 1121 Last data filed at 06/19/2024 0930 Gross per 24 hour  Intake 1829.04 ml  Output 0 ml  Net 1829.04 ml   Filed Weights   06/17/24 0534  Weight: 99.8 kg    Examination:  General exam: Appears calm and comfortable  Respiratory system: Clear to auscultation.  No wheezes, no crackles, no rhonchi.  Fair air movement.  Speaking in full sentences.  Respiratory effort normal. Cardiovascular system: S1 & S2 heard, RRR. No JVD, murmurs, rubs, gallops or clicks. No pedal edema. Gastrointestinal system: Abdomen is nondistended, soft and nontender. No organomegaly or masses felt. Normal bowel  sounds heard. Central nervous system: Alert and oriented. No focal neurological deficits. Extremities: Symmetric 5 x 5 power. Skin: No rashes, lesions or ulcers Psychiatry: Judgement and insight appear normal. Mood & affect appropriate.     Data Reviewed: I have personally reviewed following labs and imaging studies  CBC: Recent Labs  Lab 06/16/24 0010 06/17/24 0600 06/18/24 0527  WBC 9.2 7.7 7.0  NEUTROABS  --  3.6  --   HGB 15.0 13.7 12.6  HCT 45.5 42.9 40.0  MCV 82.4 84.4 85.7  PLT 436* 395 275    Basic Metabolic Panel: Recent Labs  Lab 06/16/24 0010 06/16/24 0453 06/17/24 0600 06/18/24 0527 06/19/24 0521  NA 141 137 138 140 138  K 3.5 3.0* 3.6 3.5 3.4*  CL 104 103 101 108 102  CO2 17* 22 19*  24 22  GLUCOSE 203* 111* 193* 119* 113*  BUN 11 10 10 6  <5*  CREATININE 0.93 0.85 1.04* 0.77 0.74  CALCIUM  9.4 8.4* 9.3 8.4* 8.8*  MG  --   --   --  2.0  --     GFR: Estimated Creatinine Clearance: 95.8 mL/min (by C-G formula based on SCr of 0.74 mg/dL).  Liver Function Tests: Recent Labs  Lab 06/16/24 0010 06/17/24 0600 06/18/24 0527  AST 39 37 38  ALT 34 35 38  ALKPHOS 94 84 72  BILITOT 1.0 1.2 0.8  PROT 8.6* 8.5* 7.4  ALBUMIN 4.5 4.3 3.7    CBG: Recent Labs  Lab 06/18/24 1658 06/18/24 1937 06/18/24 2327 06/19/24 0423 06/19/24 0751  GLUCAP 109* 137* 116* 110* 106*     No results found for this or any previous visit (from the past 240 hours).       Radiology Studies: No results found.       Scheduled Meds:  amLODipine   10 mg Oral Daily   amphetamine -dextroamphetamine   10 mg Oral Q breakfast   bisacodyl   10 mg Rectal Daily   buPROPion   150 mg Oral q morning   enoxaparin  (LOVENOX ) injection  40 mg Subcutaneous Q24H   escitalopram   20 mg Oral QHS   fluticasone  furoate-vilanterol  1 puff Inhalation Daily   hydroxychloroquine   200 mg Oral Daily   insulin  aspart  0-9 Units Subcutaneous Q4H   linaclotide   72 mcg Oral QODAY   losartan    50 mg Oral Daily   pantoprazole   40 mg Oral BID AC   polyethylene glycol  17 g Oral BID   senna-docusate  1 tablet Oral BID   sodium chloride  flush  3 mL Intravenous Q12H   Continuous Infusions:  ampicillin -sulbactam (UNASYN ) IV 3 g (06/19/24 0534)     LOS: 1 day    Time spent: 35 minutes    Toribio Hummer, MD [facility]   To contact the attending provider between 7A-7P or the covering provider during after hours 7P-7A, please log into the web site [email or web address] and access using universal [place] password for that web site. If you do not have the password, please call the hospital operator.  06/19/2024, 11:21 AM

## 2024-06-19 NOTE — Progress Notes (Signed)
 Mobility Specialist - Progress Note   06/19/24 1341  Mobility  Activity Ambulated with assistance in hallway  Level of Assistance Contact guard assist, steadying assist  Assistive Device Front wheel walker  Distance Ambulated (ft) 80 ft  RLE Weight Bearing Per Provider Order WBAT  Activity Response Tolerated well  Mobility Referral Yes  Mobility visit 1 Mobility  Mobility Specialist Start Time (ACUTE ONLY) 1318  Mobility Specialist Stop Time (ACUTE ONLY) 1339  Mobility Specialist Time Calculation (min) (ACUTE ONLY) 21 min   Pt received EOB and agreeable to mobility. Pt was minA from STS. Wheeled pt back to room d/t R foot pain. No other complaints during session. Pt to EOB after session with all needs met.    [facility]

## 2024-06-19 NOTE — Discharge Summary (Signed)
 Physician Discharge Summary  [name] FMW:[id number] DOB: [birth year] DOA: 06/17/2024  PCP: Sun, Vyvyan, MD  Admit date: 06/17/2024 Discharge date: 06/19/2024  Time spent: 60 minutes  Recommendations for Outpatient Follow-up:  Follow-up with Sun, Vyvyan, MD in 2 weeks.  On follow-up patient will need a basic metabolic profile done to follow-up on electrolytes and renal function.   Discharge Diagnoses:  Principal Problem:   Nausea & vomiting Active Problems:   Esophageal reflux   Anemia, iron deficiency   Fibromyalgia   History of gastroesophageal reflux (GERD)   HTN (hypertension)   Hyperlipidemia   DM (diabetes mellitus), type 2 (HCC)   Dehydration   Constipation   S/P bunionectomy   Depression   Discharge Condition: Stable and improved.  Diet recommendation: Carb modified  Filed Weights   06/17/24 0534  Weight: 99.8 kg    History of present illness:  [name] is a 53 y.o. female with medical history significant of hypertension, diabetes mellitus type 2, osteoarthritis, fibromyalgia, lupus, depression, ADHD, hyperlipidemia presenting to the ED with a 3-day history of intractable nausea vomiting and upper abdominal/epigastric pain.  Patient states had a bunionectomy done of her right foot 8 days prior to admission was placed on amoxicillin  which she was able to tolerate until 4 days prior to admission which she completed.  Patient stated had 1-1/2 more days of antibiotics to complete prior to her nausea and vomiting and inability to keep anything down.  Patient does endorse constipation and has not had a bowel movement for over 2 weeks.  Patient endorses some palpitations with some associated shortness of breath with resolution of shortness of breath.  Patient denies any fevers, no chills, no chest pain, no melena, no hematemesis, no hematochezia, no dysuria, no lightheadedness, no dizziness, no syncopal episodes, no diarrhea. Patient noted to have been  seen in the ED with similar symptoms 1 day prior to admission workup done reassuring and patient discharged home on antiemetics and hydrated.  Patient stated due to ongoing nausea and vomiting without any improvement return back to the ED on day of admission.   ED Course: Patient seen in the ED noted to have elevated blood pressures with systolics in the 170s, noted to be tachycardic with heart rates in the 120s, lactic acid noted at 0.8, urinalysis done nitrite negative leukocytes -0-5 WBCs.  Comprehensive metabolic profile done with a bicarb of 19, glucose of 193, creatinine 1.04, protein of 8.5, otherwise within normal limits.  CBC done unremarkable.  Lipase noted at 32.  Patient noted to have received IV Compazine  10 mg, IV Benadryl , IV Pepcid , 1 L IV fluid bolus, 10 mg of IV Reglan , 4 mg of IV morphine  however with noes significant clinical improvement with ongoing nausea and emesis hospitalist were called to admit the patient.  Hospital Course:  #1 nausea vomiting epigastric abdominal pain -??  Etiology. -??  Viral gastroenteritis versus antibiotic induced however patient does state tolerating oral antibiotics for 4 days prior to onset of her symptoms. - Patient seen in the ED  06/16/2024 and underwent CT abdomen and pelvis which was unremarkable except noting a fatty liver and diverticulosis without diverticulitis. - Lipase within normal limits. - Patient noted to have undergone upper endoscopy 07/04/2020 which showed a normal esophagus, congestive gastropathy, normal duodenum with biopsies showing positive H. pylori, chronic active gastritis. -Patient initially placed on bowel rest, IV antiemetics, IV pain medication, IV fluids. -Patient improved clinically and was started on clear liquids which she tolerated. -  Diet advance as tolerated from clear liquids to full liquid and subsequently a soft diet which patient tolerated. -Patient had no further nausea vomiting epigastric abdominal pain and will  be discharged home in stable and improved condition. -Outpatient follow-up with PCP.   2.  GERD - Patient initially on presentation was placed on IV PPI due to nausea and vomiting as patient improved and was able to tolerate oral intake IV PPI was transitioned to oral PPI.   - Patient be discharged back home on home regimen of omeprazole .  -Outpatient follow-up with PCP.      3.  Type 2 diabetes mellitus - Hemoglobin A1c noted at 6.2.  -Patient noted to be on Ozempic prior to admission which was held during the hospitalization. -Patient maintained on sliding scale insulin . -Outpatient follow-up with PCP.   4.  Dehydration - Resolved with hydration.   5.  Status post bunionectomy - Patient noted to have a bunionectomy done 8 days prior to admission noted to have completed 4 days of oral antibiotics prior to onset of symptoms with intractable nausea and vomiting unable to keep anything down. - Patient received 2 days of IV Unasyn  during the hospitalization to complete prior antibiotic course that patient was placed on prior to admission.  - Patient states saw podiatrist 1 day prior to admission and will be following up with podiatry in 2 weeks. -Patient seen by PT/OT during the hospitalization. -Outpatient follow-up with podiatry.   6.  Constipation -Patient states has not had a bowel movement for over 2 weeks. - Patient noted to be on opiate pain medication per med rec likely drug induced. - Dulcolax suppository daily ordered however patient noted to be refusing. -Patient placed on MiraLAX  twice daily, senna: S twice daily with good results. -Outpatient follow-up.   7.  Sinus tachycardia -Likely secondary to problem #1 in the setting of dehydration. - EKG with no ischemic changes noted. - Improved with IV fluids and IV Lopressor .   -Resolved by day of discharge.   8.  Hypertension -BP elevated with systolics in the 170s secondary to inability to keep medications down due to 3-day  history of nausea vomiting and epigastric abdominal pain on admission. - Patient was placed on Lopressor  5 mg IV 8 hours.   - Patient improved clinically, and no further nausea or vomiting and home regimen Norvasc  10 mg daily resumed.   - IV Lopressor  subsequently discontinued and patient placed back on home regimen Cozaar .   - Outpatient follow-up with PCP.    9.  Depression/fibromyalgia - Patient maintained on home regimen Wellbutrin , Lexapro .   10.  History of lupus - Patient maintained on home regimen Plaquenil .      Procedures: None  Consultations: None  Discharge Exam: Vitals:   06/19/24 0929 06/19/24 1349  BP: (!) 151/79 (!) 160/105  Pulse: 79 87  Resp:  20  Temp:  97.7 F (36.5 C)  SpO2:  100%    General: NAD Cardiovascular: RRR no murmurs rubs or gallops.  No JVD.  No lower extremity edema. Respiratory: Clear to auscultation bilaterally.  No wheezes, no crackles, no rhonchi.  Fair air movement.  Speaking in full sentences.  Discharge Instructions   Discharge Instructions     Diet Carb Modified   Complete by: As directed    Increase activity slowly   Complete by: As directed       Allergies as of 06/19/2024       Reactions   Lisinopril Cough  Prednisone Other (See Comments)   Rapid heartbeat   Methylphenidate Palpitations        Medication List     STOP taking these medications    Motegrity  2 MG Tabs Generic drug: Prucalopride Succinate    ondansetron  4 MG tablet Commonly known as: ZOFRAN        TAKE these medications    acetaminophen  325 MG tablet Commonly known as: TYLENOL  Take 2 tablets (650 mg total) by mouth every 6 (six) hours as needed for mild pain (pain score 1-3) or fever (or Fever >/= 101).   albuterol  108 (90 Base) MCG/ACT inhaler Commonly known as: VENTOLIN  HFA Inhale 2 puffs into the lungs in the morning, at noon, in the evening, and at bedtime.   amLODipine  10 MG tablet Commonly known as: NORVASC  Take 10 mg by  mouth daily.   amphetamine -dextroamphetamine  5 MG tablet Commonly known as: ADDERALL Take 10 mg by mouth daily with breakfast.   buPROPion  150 MG 24 hr tablet Commonly known as: WELLBUTRIN  XL Take 150 mg by mouth every morning.   diclofenac  Sodium 1 % Gel Commonly known as: VOLTAREN  Apply 2-4 grams to affected joint 4 times daily as needed. What changed:  how much to take how to take this when to take this reasons to take this additional instructions   escitalopram  20 MG tablet Commonly known as: LEXAPRO  Take 20 mg by mouth at bedtime.   ezetimibe 10 MG tablet Commonly known as: ZETIA Take 10 mg by mouth at bedtime.   fluticasone  50 MCG/ACT nasal spray Commonly known as: FLONASE  Place 1 spray into both nostrils daily. What changed:  when to take this reasons to take this   fluticasone -salmeterol 115-21 MCG/ACT inhaler Commonly known as: Advair HFA Inhale 2 puffs into the lungs 2 (two) times daily.   gabapentin 300 MG capsule Commonly known as: NEURONTIN Take 900 mg by mouth at bedtime.   HYDROcodone -acetaminophen  5-325 MG tablet Commonly known as: NORCO/VICODIN Take one to two tablets every six to eight hours as needed for pain.   hydroxychloroquine  200 MG tablet Commonly known as: PLAQUENIL  TAKE 1 TABLET BY MOUTH TWICE DAILY MONDAY THROUGH FRIDAY ONLY (NONE ON SATURDAY OR SUNDAY)   ipratropium 0.03 % nasal spray Commonly known as: ATROVENT  Place 2 sprays into both nostrils every 12 (twelve) hours as needed for rhinitis.   Linzess  72 MCG capsule Generic drug: linaclotide  Take 1 capsule (72 mcg total) by mouth every other day. What changed: when to take this   losartan  50 MG tablet Commonly known as: COZAAR  Take 50 mg by mouth daily.   metoCLOPramide  5 MG tablet Commonly known as: REGLAN  TAKE 1 TABLET (5 MG TOTAL) BY MOUTH EVERY 8 (EIGHT) HOURS AS NEEDED FOR NAUSEA.   omeprazole  20 MG capsule Commonly known as: PRILOSEC TAKE 2 CAPSULES EVERY DAY  (NEED MD APPOINTMENT) What changed: See the new instructions.   Ozempic (1 MG/DOSE) 4 MG/3ML Sopn Generic drug: Semaglutide (1 MG/DOSE) Inject 1 mg into the skin every Friday.   polyethylene glycol 17 g packet Commonly known as: MIRALAX  / GLYCOLAX  Take 17 g by mouth daily as needed.   tiZANidine  4 MG tablet Commonly known as: ZANAFLEX  TAKE 1 TABLET EVERY 8 HOURS AS NEEDED FOR MUSCLE SPASM(S) What changed: See the new instructions.   traMADol  50 MG tablet Commonly known as: ULTRAM  Take 50 mg by mouth 2 (two) times daily as needed (for pain).   valACYclovir 500 MG tablet Commonly known as: VALTREX Take 500 mg by mouth  2 (two) times daily as needed (for outbreaks).   zolpidem  5 MG tablet Commonly known as: AMBIEN  TAKE 1 TABLET (5 MG TOTAL) BY MOUTH AT BEDTIME AS NEEDED FOR SLEEP. What changed: when to take this               Durable Medical Equipment  (From admission, onward)           Start     Ordered   06/18/24 1422  For home use only DME Bedside commode  Once       Question:  Patient needs a bedside commode to treat with the following condition  Answer:  Debility   06/18/24 1421   06/18/24 1421  For home use only DME 3 n 1  Once        06/18/24 1420           Allergies  Allergen Reactions   Lisinopril Cough   Prednisone Other (See Comments)    Rapid heartbeat   Methylphenidate Palpitations    Follow-up Information     [facility] Follow up.   Specialty: Home Health Services Contact information: [address] [place] [address] [phone]         Sun, Vyvyan, MD. Schedule an appointment as soon as possible for a visit in 2 week(s).   Specialty: Family Medicine Contact information: [phone] [address] [place] [address] [phone]                  The results of significant diagnostics from this hospitalization (including imaging, microbiology, ancillary and laboratory) are listed  below for reference.    Significant Diagnostic Studies: DG Foot Complete Right Result Date: 06/17/2024 Please see detailed radiograph report in office note.  CT ABDOMEN PELVIS W CONTRAST Result Date: 06/16/2024 CLINICAL DATA:  Abdominal pain EXAM: CT ABDOMEN AND PELVIS WITH CONTRAST TECHNIQUE: Multidetector CT imaging of the abdomen and pelvis was performed using the standard protocol following bolus administration of intravenous contrast. RADIATION DOSE REDUCTION: This exam was performed according to the departmental dose-optimization program which includes automated exposure control, adjustment of the mA and/or kV according to patient size and/or use of iterative reconstruction technique. CONTRAST:  OMNIPAQUE  IOHEXOL  350 MG/ML SOLN COMPARISON:  05/26/2023 FINDINGS: Hepatobiliary: Fatty infiltration of the liver is noted. The gallbladder is within normal limits. Pancreas: Unremarkable. No pancreatic ductal dilatation or surrounding inflammatory changes. Spleen: Normal in size without focal abnormality. Adrenals/Urinary Tract: Adrenal glands are unremarkable. Kidneys demonstrate a normal enhancement pattern bilaterally. No renal calculi are seen. No obstructive changes are noted. The bladder is decompressed. Stomach/Bowel: No obstructive or inflammatory changes of the colon are seen. Mild diverticular change is noted without diverticulitis. The appendix is air-filled and limits. Small bowel and stomach are unremarkable. Vascular/Lymphatic: No significant vascular findings are present. No enlarged abdominal or pelvic lymph nodes. Reproductive: Uterus and bilateral adnexa are unremarkable. Other: No abdominal wall hernia or abnormality. No abdominopelvic ascites. Musculoskeletal: No acute or significant osseous findings. IMPRESSION: Fatty liver. Diverticulosis without diverticulitis. Electronically Signed   By: Oneil Devonshire M.D.   On: 06/16/2024 02:11   CT Angio Chest Pulmonary Embolism (PE) W or WO  Contrast Result Date: 06/16/2024 CLINICAL DATA:  Chest and abdominal pain EXAM: CT ANGIOGRAPHY CHEST WITH CONTRAST TECHNIQUE: Multidetector CT imaging of the chest was performed using the standard protocol during bolus administration of intravenous contrast. Multiplanar CT image reconstructions and MIPs were obtained to evaluate the vascular anatomy. RADIATION  DOSE REDUCTION: This exam was performed according to the departmental dose-optimization program which includes automated exposure control, adjustment of the mA and/or kV according to patient size and/or use of iterative reconstruction technique. CONTRAST:  OMNIPAQUE  IOHEXOL  350 MG/ML SOLN COMPARISON:  None Available. FINDINGS: Cardiovascular: Thoracic aorta its branches are within normal limits. No cardiac enlargement is seen. Pulmonary artery shows a normal branching pattern bilaterally. No filling defect to suggest pulmonary embolism is noted. Mediastinum/Nodes: Thoracic inlet is within normal limits. No hilar or mediastinal adenopathy is noted. The esophagus as visualized is within normal limits. Lungs/Pleura: Lungs are well aerated bilaterally. No focal infiltrate or effusion is noted. Tiny subpleural left upper lobe nodule is noted measuring 2-3 mm. No follow-up is recommended. Musculoskeletal: No acute bony abnormality noted. Review of the MIP images confirms the above findings. IMPRESSION: No evidence of pulmonary emboli. Tiny subpleural left upper lobe nodule. No follow-up is recommended. Electronically Signed   By: Oneil Devonshire M.D.   On: 06/16/2024 02:09    Microbiology: No results found for this or any previous visit (from the past 240 hours).   Labs: Basic Metabolic Panel: Recent Labs  Lab 06/16/24 0010 06/16/24 0453 06/17/24 0600 06/18/24 0527 06/19/24 0521  NA 141 137 138 140 138  K 3.5 3.0* 3.6 3.5 3.4*  CL 104 103 101 108 102  CO2 17* 22 19* 24 22  GLUCOSE 203* 111* 193* 119* 113*  BUN 11 10 10 6  <5*  CREATININE 0.93  0.85 1.04* 0.77 0.74  CALCIUM  9.4 8.4* 9.3 8.4* 8.8*  MG  --   --   --  2.0  --    Liver Function Tests: Recent Labs  Lab 06/16/24 0010 06/17/24 0600 06/18/24 0527  AST 39 37 38  ALT 34 35 38  ALKPHOS 94 84 72  BILITOT 1.0 1.2 0.8  PROT 8.6* 8.5* 7.4  ALBUMIN 4.5 4.3 3.7   Recent Labs  Lab 06/16/24 0010 06/17/24 0600  LIPASE 31 32   No results for input(s): AMMONIA in the last 168 hours. CBC: Recent Labs  Lab 06/16/24 0010 06/17/24 0600 06/18/24 0527  WBC 9.2 7.7 7.0  NEUTROABS  --  3.6  --   HGB 15.0 13.7 12.6  HCT 45.5 42.9 40.0  MCV 82.4 84.4 85.7  PLT 436* 395 275   Cardiac Enzymes: No results for input(s): CKTOTAL, CKMB, CKMBINDEX, TROPONINI in the last 168 hours. BNP: BNP (last 3 results) No results for input(s): BNP in the last 8760 hours.  ProBNP (last 3 results) No results for input(s): PROBNP in the last 8760 hours.  CBG: Recent Labs  Lab 06/18/24 1937 06/18/24 2327 06/19/24 0423 06/19/24 0751 06/19/24 1124  GLUCAP 137* 116* 110* 106* 113*       Signed:  Toribio Hummer MD.  [facility] 06/19/2024, 2:39 PM

## 2024-06-21 ENCOUNTER — Encounter: Payer: Self-pay | Admitting: Podiatry

## 2024-06-21 NOTE — Progress Notes (Signed)
 Subjective:  Patient ID: [name], female    DOB: 1971/04/03,  MRN: [id number]  [name] presents to clinic today for:  Chief Complaint  Patient presents with   Routine Post Op    1 DOS 6/24 RT BUNIONECTOMY W OSTEOTOMY OF 1ST MET/1ST PROXIMAL PHALAUX W/ INTERNAL FIXATION, ARTHRODESIS RT 2ND TOE W/FIXATION,RT 2ND MET HEAD CONDYLECTOMY, RT TAYLORS BUNIONECTOMY, 5TH HAMMER TOE CORRECTION RT (*take vitals)   Patient is 1 week postop after undergoing a bunionectomy with double osteotomy of the first ray, right second hammertoe correction with K wire fixation, right tailor's bunionectomy, right fifth hammertoe derotational arthroplasty.  Patient denies fever, chills, or night sweats.  She has experienced vomiting over the past 12-24 hours.  She was seen in the emergency department on 06/15/2024 and given Zofran .  She did not feel it had been working to this point.  She also was given a prescription for sublingual Zofran .  Patient denies chest pain or shortness of breath.  Patient denies calf pain.  Past Medical History:  Diagnosis Date   ADD (attention deficit disorder with hyperactivity)    Allergic rhinitis    Anxiety    Arthritis    Depression    Diabetes mellitus without complication (HCC)    Dysmenorrhea    Fibromyalgia    GERD (gastroesophageal reflux disease)    H. pylori infection    per patient    HLD (hyperlipidemia)    HTN (hypertension)    Insomnia    Irritable bowel syndrome    Restless leg syndrome    Sleep apnea     Past Surgical History:  Procedure Laterality Date   arthroplasty of toe Right 06/09/2024   derotational arthroplasty right 5th toe   BUNIONECTOMY Left 2008   DILATION AND CURETTAGE OF UTERUS  2004   EAR CYST EXCISION Left 1983   ESOPHAGEAL MANOMETRY N/A 01/02/2023   Procedure: ESOPHAGEAL MANOMETRY (EM);  Surgeon: Legrand Victory LITTIE DOUGLAS, MD;  Location: [facility];  Service: Gastroenterology;  Laterality: N/A;   HAMMER TOE  SURGERY Right 06/09/2024   2nd toe   KNEE ARTHROSCOPY Right    with knee cap adjustment   METATARSAL OSTEOTOMY WITH BUNIONECTOMY Right 06/09/2024   PH IMPEDANCE STUDY N/A 01/02/2023   Procedure: PH IMPEDANCE STUDY;  Surgeon: Legrand Victory LITTIE DOUGLAS, MD;  Location: [facility];  Service: Gastroenterology;  Laterality: N/A;   Tailor bunionectomy Right 06/09/2024   UPPER GASTROINTESTINAL ENDOSCOPY  06/29/2020    Allergies  Allergen Reactions   Lisinopril Cough   Prednisone Other (See Comments)    Rapid heartbeat   Methylphenidate Palpitations    Objective:  Vitals:   06/16/24 1159  BP: (!) 161/92  Pulse: (!) 114  Temp: 97.9 F (36.6 C)    Physical Examination: There are palpable pedal pulses to the surgical foot.  There is mild-moderate localized edema to the surgical area.  The incision is well-approximated and the sutures are holding well.  No active drainage is noted.  No clinical signs of infection are seen.  Toes remain in correct position.     Latest Ref Rng & Units 06/17/2024    5:00 PM  Hemoglobin A1C  Hemoglobin-A1c 4.8 - 5.6 % 6.2    Radiographic Examination (3 views, right foot): Hardware is intact in the first ray and second toe.  Toes are in corrected position although these are not true weightbearing AP and lateral views since patient was nonweightbearing for the x-rays today.  Assessment/Plan: 1. Post-operative state   2. Bunion   3. Hammertoe of right foot   4. Tailor's bunion of right foot     DG FOOT COMPLETE RIGHT  Discussed findings with the patient today.  Patient will continue to rest ice and elevate the foot.  If she continues to have nausea/vomiting that is not resolved with the prescription Zofran  that she states was prescribed upon discharge from the emergency room, I would recommend reevaluation via the ED since she is 1 week postop and diabetic.  They can provide emergency imaging and treatment of the abdomen/thorax as needed to rule out other  causative factors.  Follow-up as scheduled in 1 to 2 weeks for recheck and possible suture removal   Fotini Lemus DSABRA [place], DPM, FACFAS [facility]     [address] [place], [place] [address]                Office [phone]  Fax [phone]

## 2024-06-21 NOTE — Progress Notes (Signed)
 Pt discharged. AVS printed and given to patient. IV removed. Reviewed medications and follow up appointments. Education provided about diet and monitoring symptoms. Daughter at bedside. Pt discharged via wheelchair. All questions asked and answered.

## 2024-06-24 ENCOUNTER — Telehealth: Payer: Self-pay | Admitting: Podiatry

## 2024-06-24 MED ORDER — HYDROCODONE-ACETAMINOPHEN 5-325 MG PO TABS: ORAL_TABLET | ORAL | 0 refills | Status: AC

## 2024-06-24 NOTE — Telephone Encounter (Signed)
 Patient called requesting a refill of hydrocodone . She would like it sent to [facility] mail delivery.

## 2024-06-26 NOTE — Telephone Encounter (Signed)
 Left message on machine to notify the patient in regards to refill.

## 2024-06-30 ENCOUNTER — Ambulatory Visit (INDEPENDENT_AMBULATORY_CARE_PROVIDER_SITE_OTHER): Admitting: Podiatry

## 2024-06-30 DIAGNOSIS — Z9889 Other specified postprocedural states: Secondary | ICD-10-CM

## 2024-07-01 DIAGNOSIS — I10 Essential (primary) hypertension: Secondary | ICD-10-CM | POA: Diagnosis not present

## 2024-07-01 DIAGNOSIS — R109 Unspecified abdominal pain: Secondary | ICD-10-CM | POA: Diagnosis not present

## 2024-07-01 DIAGNOSIS — E1140 Type 2 diabetes mellitus with diabetic neuropathy, unspecified: Secondary | ICD-10-CM | POA: Diagnosis not present

## 2024-07-01 DIAGNOSIS — R112 Nausea with vomiting, unspecified: Secondary | ICD-10-CM | POA: Diagnosis not present

## 2024-07-02 ENCOUNTER — Encounter: Admitting: Podiatry

## 2024-07-02 NOTE — Progress Notes (Signed)
 Subjective:  Patient ID: [name], female    DOB: 02/16/1971,  MRN: [id number]  [name] presents to clinic today for:  Chief Complaint  Patient presents with   Routine Post Op    Some pain in bottom of foot. Some redness near the 2nd toe. No drainage. Sutures removed in entirety at today's visit.    Patient is 3 weeks postop after undergoing a right bunion, second hammertoe, fifth adductovarus toe and tailor's bunionectomy at [facility].  Patient denies fever, chills, night sweats, nausea/vomiting.  Patient denies chest pain or shortness of breath.  Patient denies calf pain.  As she was admitted to the hospital after her last appointment for the intractable nausea and vomiting.  According to the discharge summary it is still unclear what the cause of the symptoms were.  She had not had a bowel movement in 2 weeks, which would have began prior to surgery.  Her A1c was 6.2.  They are not sure if the cause was of viral.  She states that she is feeling much better now upon discharge.  Has no further nausea.  Her dressings were not changed in the hospital.  Denies any injury or excessive walking while at the hospital.  Past Medical History:  Diagnosis Date   ADD (attention deficit disorder with hyperactivity)    Allergic rhinitis    Anxiety    Arthritis    Depression    Diabetes mellitus without complication (HCC)    Dysmenorrhea    Fibromyalgia    GERD (gastroesophageal reflux disease)    H. pylori infection    per patient    HLD (hyperlipidemia)    HTN (hypertension)    Insomnia    Irritable bowel syndrome    Restless leg syndrome    Sleep apnea     Past Surgical History:  Procedure Laterality Date   arthroplasty of toe Right 06/09/2024   derotational arthroplasty right 5th toe   BUNIONECTOMY Left 2008   DILATION AND CURETTAGE OF UTERUS  2004   EAR CYST EXCISION Left 1983   ESOPHAGEAL MANOMETRY N/A 01/02/2023    Procedure: ESOPHAGEAL MANOMETRY (EM);  Surgeon: Legrand Victory LITTIE DOUGLAS, MD;  Location: [facility];  Service: Gastroenterology;  Laterality: N/A;   HAMMER TOE SURGERY Right 06/09/2024   2nd toe   KNEE ARTHROSCOPY Right    with knee cap adjustment   METATARSAL OSTEOTOMY WITH BUNIONECTOMY Right 06/09/2024   PH IMPEDANCE STUDY N/A 01/02/2023   Procedure: PH IMPEDANCE STUDY;  Surgeon: Legrand Victory LITTIE DOUGLAS, MD;  Location: [facility];  Service: Gastroenterology;  Laterality: N/A;   Tailor bunionectomy Right 06/09/2024   UPPER GASTROINTESTINAL ENDOSCOPY  06/29/2020    Allergies  Allergen Reactions   Lisinopril Cough   Prednisone Other (See Comments)    Rapid heartbeat   Methylphenidate Palpitations    Objective:  Physical Examination: There are palpable pedal pulses.  There is localized edema to the surgical areas.  The incisions are well-approximated and the sutures are holding well.  No active drainage is noted.  No clinical signs of infection are seen.  The K wire in the second toe appears in good position.  The toes are still in rectus position.     Latest Ref Rng & Units 06/17/2024    5:00 PM  Hemoglobin A1C  Hemoglobin-A1c 4.8 - 5.6 % 6.2    Assessment/Plan: 1. Post-operative state    The nurse did remove all of  the sutures today uneventfully.  These were replaced by Steri-Strips.  A new dry sterile dressing was applied after application of Xeroform gauze.  She will continue with the surgical shoe and perform minimal weightbearing as tolerated.  She is scheduled next week for x-ray and possible extraction of the second toe K wire.  She was instructed to take 1 hydrocodone  pill 1 to 2 hours prior to that appointment.  Continue with ice and elevation of the foot as well as rest.   Mikenzie Mccannon D. Hilliard Borges, DPM, FACFAS [facility]     [address] [place], [place] [address]                Office [phone]  Fax [phone]

## 2024-07-16 ENCOUNTER — Encounter: Admitting: Podiatry

## 2024-07-21 ENCOUNTER — Encounter: Admitting: Podiatry

## 2024-07-23 ENCOUNTER — Telehealth: Payer: Self-pay

## 2024-07-23 NOTE — Telephone Encounter (Signed)
 LM- patient needs to make over due followup appointment for refill has not been seen since 05/ 2024

## 2024-07-28 ENCOUNTER — Ambulatory Visit (INDEPENDENT_AMBULATORY_CARE_PROVIDER_SITE_OTHER)

## 2024-07-28 ENCOUNTER — Encounter: Payer: Self-pay | Admitting: Podiatry

## 2024-07-28 ENCOUNTER — Ambulatory Visit (INDEPENDENT_AMBULATORY_CARE_PROVIDER_SITE_OTHER): Admitting: Podiatry

## 2024-07-28 DIAGNOSIS — M21611 Bunion of right foot: Secondary | ICD-10-CM

## 2024-07-28 DIAGNOSIS — M2011 Hallux valgus (acquired), right foot: Secondary | ICD-10-CM | POA: Diagnosis not present

## 2024-07-28 DIAGNOSIS — M2041 Other hammer toe(s) (acquired), right foot: Secondary | ICD-10-CM

## 2024-07-28 DIAGNOSIS — M21621 Bunionette of right foot: Secondary | ICD-10-CM | POA: Diagnosis not present

## 2024-07-28 DIAGNOSIS — M205X1 Other deformities of toe(s) (acquired), right foot: Secondary | ICD-10-CM

## 2024-07-28 DIAGNOSIS — Z9889 Other specified postprocedural states: Secondary | ICD-10-CM

## 2024-07-28 NOTE — Progress Notes (Signed)
 Subjective:  Patient ID: [name], female    DOB: 06-Nov-1971,  MRN: [id number]  [name] presents to clinic today for:  Chief Complaint  Patient presents with   Routine Post Op    POV # 3 DOS 6/24 RT BUNIONECTOMY W OSTEOTOMY OF 1ST MET/1ST PROXIMAL PHALAUX W/ INTERNAL FIXATION, ARTHRODESIS RT 2ND TOE W/FIXATION,RT 2ND MET HEAD CONDYLECTOMY, RT TAYLORS BUNIONECTOMY, 5TH HAMMER TOE CORRECTION RT (*XR foot and pull K-wire, need pin extractor) Pt says she has minimal pain. Very itchy. Diabetic. A1c 6.2. no anti coag   Patient is 6 weeks postop after undergoing a right bunion correction, right second hammertoe correction, right fifth hammertoe and tailor's bunion correction.  Patient denies fever, chills, night sweats, nausea/vomiting.  Patient denies chest pain or shortness of breath.  Patient denies calf pain.  She has no pain at this time.  She did have to remove some of the dressing because her skin was getting extremely dry and itchy.  She had to push off this postop appointment due to vacation and the power going out at the office on her rescheduled date.  Past Medical History:  Diagnosis Date   ADD (attention deficit disorder with hyperactivity)    Allergic rhinitis    Anxiety    Arthritis    Depression    Diabetes mellitus without complication (HCC)    Dysmenorrhea    Fibromyalgia    GERD (gastroesophageal reflux disease)    H. pylori infection    per patient    HLD (hyperlipidemia)    HTN (hypertension)    Insomnia    Irritable bowel syndrome    Restless leg syndrome    Sleep apnea     Past Surgical History:  Procedure Laterality Date   arthroplasty of toe Right 06/09/2024   derotational arthroplasty right 5th toe   BUNIONECTOMY Left 2008   DILATION AND CURETTAGE OF UTERUS  2004   EAR CYST EXCISION Left 1983   ESOPHAGEAL MANOMETRY N/A 01/02/2023   Procedure: ESOPHAGEAL MANOMETRY (EM);  Surgeon: Legrand Victory LITTIE DOUGLAS, MD;  Location: [facility];  Service: Gastroenterology;  Laterality: N/A;   HAMMER TOE SURGERY Right 06/09/2024   2nd toe   KNEE ARTHROSCOPY Right    with knee cap adjustment   METATARSAL OSTEOTOMY WITH BUNIONECTOMY Right 06/09/2024   PH IMPEDANCE STUDY N/A 01/02/2023   Procedure: PH IMPEDANCE STUDY;  Surgeon: Legrand Victory LITTIE DOUGLAS, MD;  Location: [facility];  Service: Gastroenterology;  Laterality: N/A;   Tailor bunionectomy Right 06/09/2024   UPPER GASTROINTESTINAL ENDOSCOPY  06/29/2020    Allergies  Allergen Reactions   Lisinopril Cough   Prednisone Other (See Comments)    Rapid heartbeat   Methylphenidate Palpitations    Objective:  Physical Examination: There are palpable pedal pulses.  There is localized mild edema to the surgical area.  The incisions are well-approximated and coapted well.  No active drainage is noted.  No clinical signs of infection are seen.  The toes are in corrected position.     Latest Ref Rng & Units 06/17/2024    5:00 PM  Hemoglobin A1C  Hemoglobin-A1c 4.8 - 5.6 % 6.2    Radiographic Examination (3 views, right foot, 3 weightbearing views, 07/28/2024): The first MPJ is congruent.  The K wire is in place in the second toe with no bending or breakage of the wire.  The 1st, 2nd and 5th toes are in rectus position.  No erosions are noted.  The osteotomy  sites are showing good signs of radiographic healing.  Assessment/Plan: 1. Post-operative state   2. Bunion, right foot   3. Adductovarus rotation of toe, acquired, right   4. Tailor's bunion of right foot   5. Hammertoe of right foot   6. Hav (hallux abducto valgus), right     Reviewed the clinical and radiographic findings with patient today.  The K wire was extracted uneventfully from the right second toe today.  She tolerated this very well.  The Steri-Strips were removed as well and the skin was wiped clean.  Antibiotic ointment was applied to all incisions and the distal second toe area and a dry sterile dressing  applied.  She will remove this tomorrow and begin showering.  She was switched from her OrthoWedge shoe to a flat surgical shoe and can begin putting weight down on the foot.  Will reassess in 1 week and evaluate for PT  Return in about 1 week (around 08/04/2024) for post-op recheck / XR / Assess for PT.   Awanda CHARM Imperial, DPM, FACFAS [facility]     [address] [place], [place] [address]                Office [phone]  Fax [phone]

## 2024-08-03 ENCOUNTER — Other Ambulatory Visit: Payer: Self-pay | Admitting: Pulmonary Disease

## 2024-08-03 DIAGNOSIS — J4520 Mild intermittent asthma, uncomplicated: Secondary | ICD-10-CM

## 2024-08-03 NOTE — Telephone Encounter (Signed)
 Copied from CRM #[id number]. Topic: Clinical - Medication Refill >> Aug 03, 2024 11:43 AM Devaughn RAMAN wrote: Medication: albuterol  (VENTOLIN  HFA) 108 (90 Base) MCG/ACT inhaler AND fluticasone -salmeterol (ADVAIR HFA) 115-21 MCG/ACT inhaler  Has the patient contacted their pharmacy? Yes (Agent: If no, request that the patient contact the pharmacy for the refill. If patient does not wish to contact the pharmacy document the reason why and proceed with request.) (Agent: If yes, when and what did the pharmacy advise?)  This is the patient's preferred pharmacy:  [id number] [place], [place] - [address] [place] [address] Phone: [phone] Fax: [phone]  [facility] - [place], [place] - [address] [place] [address] Phone: [phone] Fax: [phone]  Is this the correct pharmacy for this prescription? Yes If no, delete pharmacy and type the correct one.   Has the prescription been filled recently?   Pharmacy technician Odis is unsure if the medication has been filled recently stated the patient is new to [facility] and it has never been filled by them.  Is the patient out of the medication?  Pharmacy technician is calling on patient behalf. He is unsure of how much medication the patient has left on hand  Has the patient been seen for an appointment in the last year OR does the patient have an upcoming appointment? No  Can we respond through MyChart? Yes  Ben-Pharmacy technician with [facility] called to refill medication on patient behalf.  Agent: Please be advised that Rx refills may take up to 3 business days. We ask that you follow-up with your pharmacy.

## 2024-08-10 ENCOUNTER — Encounter: Payer: Self-pay | Admitting: Podiatry

## 2024-08-10 ENCOUNTER — Ambulatory Visit (INDEPENDENT_AMBULATORY_CARE_PROVIDER_SITE_OTHER): Admitting: Podiatry

## 2024-08-10 ENCOUNTER — Ambulatory Visit (INDEPENDENT_AMBULATORY_CARE_PROVIDER_SITE_OTHER)

## 2024-08-10 DIAGNOSIS — M21611 Bunion of right foot: Secondary | ICD-10-CM | POA: Diagnosis not present

## 2024-08-10 DIAGNOSIS — M21621 Bunionette of right foot: Secondary | ICD-10-CM

## 2024-08-10 DIAGNOSIS — M2041 Other hammer toe(s) (acquired), right foot: Secondary | ICD-10-CM

## 2024-08-10 DIAGNOSIS — Z9889 Other specified postprocedural states: Secondary | ICD-10-CM

## 2024-08-10 NOTE — Progress Notes (Signed)
 Subjective:  Patient ID: [name], female    DOB: October 03, 1971,  MRN: [id number]  [name] presents to clinic today for:  Chief Complaint  Patient presents with   Routine Post Op    POV 4  DOS 6/24  Still has swelling foot pain 6.  Hot to the touch.   Patient is 8 weeks postop after undergoing a right bunionectomy with double osteotomy, right second toe arthrodesis and right tailor's bunionectomy with fifth hammertoe correction..  Patient denies fever, chills, night sweats, nausea/vomiting.  Patient denies chest pain or shortness of breath.  Patient denies calf pain.  She does feel like her neuropathy has gotten aggravated since the surgery.  She notes that she has a tendency to form keloids.  She is working on applying Mederma scar cream to the incision areas.  Past Medical History:  Diagnosis Date   ADD (attention deficit disorder with hyperactivity)    Allergic rhinitis    Anxiety    Arthritis    Depression    Diabetes mellitus without complication (HCC)    Dysmenorrhea    Fibromyalgia    GERD (gastroesophageal reflux disease)    H. pylori infection    per patient    HLD (hyperlipidemia)    HTN (hypertension)    Insomnia    Irritable bowel syndrome    Restless leg syndrome    Sleep apnea     Past Surgical History:  Procedure Laterality Date   arthroplasty of toe Right 06/09/2024   derotational arthroplasty right 5th toe   BUNIONECTOMY Left 2008   DILATION AND CURETTAGE OF UTERUS  2004   EAR CYST EXCISION Left 1983   ESOPHAGEAL MANOMETRY N/A 01/02/2023   Procedure: ESOPHAGEAL MANOMETRY (EM);  Surgeon: Legrand Victory LITTIE DOUGLAS, MD;  Location: [facility];  Service: Gastroenterology;  Laterality: N/A;   HAMMER TOE SURGERY Right 06/09/2024   2nd toe   KNEE ARTHROSCOPY Right    with knee cap adjustment   METATARSAL OSTEOTOMY WITH BUNIONECTOMY Right 06/09/2024   PH IMPEDANCE STUDY N/A 01/02/2023   Procedure: PH IMPEDANCE STUDY;  Surgeon: Legrand Victory LITTIE DOUGLAS, MD;  Location: [facility];  Service: Gastroenterology;  Laterality: N/A;   Tailor bunionectomy Right 06/09/2024   UPPER GASTROINTESTINAL ENDOSCOPY  06/29/2020    Allergies  Allergen Reactions   Lisinopril Cough   Prednisone Other (See Comments)    Rapid heartbeat   Methylphenidate Palpitations    Objective:  Physical Examination: There are palpable pedal pulses.  There is localized edema to the surgical area.  The incision is well-approximated and the scars have a cord-like appearance to it..  No active drainage is noted.  No clinical signs of infection are seen.  Decreased range of motion at the first MPJ secondary to adhesions around the first MPJ capsule and the dorsal scar contracture/adhesions.     Latest Ref Rng & Units 06/17/2024    5:00 PM  Hemoglobin A1C  Hemoglobin-A1c 4.8 - 5.6 % 6.2    Radiographic Examination (3 views, right foot, 08/10/2024): First MPJ joint space is within normal limits and the joint is congruent.  The hardware in the first metatarsal and first proximal phalanx are in good position with no signs of breakage or backing out.  The arthrodesis of the second toe PIPJ is holding well.  No evidence of erosive findings with the bones.  Assessment/Plan: 1. Post-operative state   2. Bunion, right foot   3. Hammertoe of right  foot   4. Tailor's bunion of right foot     AMB REFERRAL TO PHYSICAL THERAPY  Discussed findings with patient today.  X-rays were reviewed and she was shown how osteotomy sites of healed.  Informed patient that her scar does have a cord-like feel to them and is concerned about scar tissue.  She notes that she sometimes has a tendency to form keloids.  She is using scar cream on the area.  However the scars and deeper soft tissue adhesions around the especially at the first MPJ seem to be preventing some range of motion.  Recommend that she go to physical therapy for scar work and range of motion exercises.  She can begin  returning to normal shoe gear as tolerated at this time.  Recommend that she start massaging the Voltaren  gel across the incisions to decrease scar tissue and keloid formation.  Physical therapy can use her Voltaren  gel when performing the ultrasound as well.  Return in about 3 weeks (around 08/31/2024) for post-op recheck.   Awanda CHARM Imperial, DPM, FACFAS [facility]     [address] [place], [place] [address]                Office [phone]  Fax [phone]

## 2024-08-20 ENCOUNTER — Ambulatory Visit: Admitting: Physical Therapy

## 2024-08-24 NOTE — Therapy (Signed)
 OUTPATIENT PHYSICAL THERAPY LOWER EXTREMITY EVALUATION   Patient Name: [name] MRN: [id number] DOB:1971/05/22, 53 y.o., female Today's Date: 08/25/2024  END OF SESSION:   Past Medical History:  Diagnosis Date   ADD (attention deficit disorder with hyperactivity)    Allergic rhinitis    Anxiety    Arthritis    Depression    Diabetes mellitus without complication (HCC)    Dysmenorrhea    Fibromyalgia    GERD (gastroesophageal reflux disease)    H. pylori infection    per patient    HLD (hyperlipidemia)    HTN (hypertension)    Insomnia    Irritable bowel syndrome    Restless leg syndrome    Sleep apnea    Past Surgical History:  Procedure Laterality Date   arthroplasty of toe Right 06/09/2024   derotational arthroplasty right 5th toe   BUNIONECTOMY Left 2008   DILATION AND CURETTAGE OF UTERUS  2004   EAR CYST EXCISION Left 1983   ESOPHAGEAL MANOMETRY N/A 01/02/2023   Procedure: ESOPHAGEAL MANOMETRY (EM);  Surgeon: Legrand Victory LITTIE DOUGLAS, MD;  Location: [facility];  Service: Gastroenterology;  Laterality: N/A;   HAMMER TOE SURGERY Right 06/09/2024   2nd toe   KNEE ARTHROSCOPY Right    with knee cap adjustment   METATARSAL OSTEOTOMY WITH BUNIONECTOMY Right 06/09/2024   PH IMPEDANCE STUDY N/A 01/02/2023   Procedure: PH IMPEDANCE STUDY;  Surgeon: Legrand Victory LITTIE DOUGLAS, MD;  Location: [facility];  Service: Gastroenterology;  Laterality: N/A;   Tailor bunionectomy Right 06/09/2024   UPPER GASTROINTESTINAL ENDOSCOPY  06/29/2020   Patient Active Problem List   Diagnosis Date Noted   Nausea & vomiting 06/17/2024   HTN (hypertension) 06/17/2024   Hyperlipidemia 06/17/2024   DM (diabetes mellitus), type 2 (HCC) 06/17/2024   Dehydration 06/17/2024   Constipation 06/17/2024   S/P bunionectomy 06/17/2024   Depression 06/17/2024   Regurgitation of food 01/23/2023   Fibromyalgia 12/25/2016   Other fatigue 12/25/2016   Primary insomnia 12/25/2016   Primary  osteoarthritis of both knees 12/25/2016   ANA positive 12/25/2016   History of gastroesophageal reflux (GERD) 12/25/2016   History of anemia 12/25/2016   Trochanteric bursitis of both hips 12/25/2016   Anemia, iron deficiency 04/18/2015   Dysphagia, pharyngoesophageal phase 11/14/2011   Esophageal reflux 11/14/2011    PCP: Sun, Vyvyan, MD  REFERRING PROVIDER: Loel Awanda BIRCH, DPM  REFERRING DIAG:  M21.611 (ICD-10-CM) - Bunion, right foot  Z98.890 (ICD-10-CM) - Post-operative state  M20.41 (ICD-10-CM) - Hammertoe of right foot  M21.621 (ICD-10-CM) - Tailor's bunion of right foot    Rationale for Evaluation and Treatment: Rehabilitation  THERAPY DIAG:  Muscle weakness (generalized)  Pain in right ankle and joints of right foot  Stiffness of right foot, not elsewhere classified  PERTINENT HISTORY: Anemia, Gerd, HTN, DM2  WEIGHT BEARING RESTRICTIONS: No  FALLS:  Has patient fallen in last 6 months? No  LIVING ENVIRONMENT: Lives with: lives with their family Lives in: House/apartment Stairs: Yes: Internal: 12 steps; can reach both Has following equipment at home: None  OCCUPATION: not currently working   PRECAUTIONS: None ---------------------------------------------------------------------------------------------  SUBJECTIVE:   SUBJECTIVE STATEMENT: Eval statement 08/24/2024: had bunionectomy June 23rd 2025 , pain overall improving, still hurts in the heel and on bottom of the foot. Currently no surgical restrictions to her knowledge, just uncomfortable to put actual shoe on.   Pain 9/10 Numb around dorsal great toe, moderate swelling after walking  RED FLAGS: None   PLOF:  Independent  PATIENT GOALS:  Be able to walk for as long as she wants  NEXT MD VISIT: September 23rd, 2025 ---------------------------------------------------------------------------------------------  OBJECTIVE:  Note: Objective measures were completed at Evaluation unless otherwise  noted.  DIAGNOSTIC FINDINGS: most recent on 08/10/2024  PATIENT SURVEYS:  LEFS: 24/80  COGNITION: Overall cognitive status: Within functional limits for tasks assessed     SENSATION: Light touch: Impaired   EDEMA:  Minimal swelling around surgical site, reports moderate swelling after walking  MUSCLE LENGTH: Gastroc limited  POSTURE: No Significant postural limitations  PALPATION: Tenderness to great toe web space, dorsal metatarsal plate  LOWER EXTREMITY ROM:  Active ROM Right eval Left eval  Hip flexion    Hip extension    Hip abduction    Hip adduction    Hip internal rotation    Hip external rotation    Knee flexion    Great toe extension 10d WFL  Ankle dorsiflexion 5! 10  Ankle plantarflexion [facility] WFL  Ankle inversion [facility]  Ankle eversion Limited  WFL   (Blank rows = not tested)  ! Indicates pain with testing  LOWER EXTREMITY MMT:  MMT Right eval Left eval  Hip flexion    Hip extension    Hip abduction    Hip adduction    Hip internal rotation    Hip external rotation    Knee flexion    Knee extension    Ankle dorsiflexion 4+ 5  Ankle plantarflexion 3+ 4+  Ankle inversion 4 5  Ankle eversion 4 5   (Blank rows = not tested)  ! Indicates pain with testing  FUNCTIONAL TESTS:  SLS: L side:20s, R side: 10s in surgical shoe.   GAIT: Distance walked: 179ft Assistive device utilized: None Level of assistance: Complete Independence Comments: antalgic RLE                                                                                                                                [facility] Adult PT Treatment:                                                DATE: 08/24/2024 Self Care: Pt education, detailed below POC discussion    PATIENT EDUCATION:  Education details: Pt received education regarding HEP performance, ADL performance, functional activity tolerance, impairment education, appropriate performance of therapeutic activities.  Desensitization techniques Person educated: Patient Education method: Explanation, Demonstration, Tactile cues, Verbal cues, and Handouts Education comprehension: verbalized understanding and returned demonstration  HOME EXERCISE PROGRAM: Access Code: [email or web address] URL: [email or web address] Date: 08/25/2024 Prepared by: Mabel Kiang  Exercises - Single leg stance in corner  - 1-2 x daily - 7 x weekly - 2-3 sets - 1 reps - 53m hold - Big toe mobilization  - 1-3 x daily - 7 x weekly -  1 sets - 1-2 reps - 18m hold - Heel Raises with Counter Support  - 1 x daily - 4 x weekly - 2-3 sets - 12 reps - 2s hold ---------------------------------------------------------------------------------------------  ASSESSMENT:  CLINICAL IMPRESSION: Eval impression (08/24/2024): Pt. attended today's physical therapy session for evaluation of R bunionectomy. Pt has complaints of pain with dressing, WB, and limited ambulation/activity tolerance . Pt has notable deficits and would benefit on therapeutic focus on Ankle/foot/toe AROM, joint mobility, global foot/ankle strength, scar mobility, and pain management. Treatment performed today focused on pt education detailed in the objective. Pt demonstrated great understanding of education provided. required minimal v/t cues and no assistance for appropriate performance with today's activities. Pt requires the intervention of skilled outpatient physical therapy to address the aforementioned deficits and progress towards a functional level in line with therapeutic goals.   OBJECTIVE IMPAIRMENTS: Abnormal gait, decreased activity tolerance, decreased balance, difficulty walking, decreased ROM, decreased strength, hypomobility, increased edema, increased fascial restrictions, improper body mechanics, and pain.   ACTIVITY LIMITATIONS: standing, squatting, stairs, transfers, dressing, and locomotion level  PARTICIPATION LIMITATIONS: interpersonal relationship,  driving, shopping, community activity, and yard work  PERSONAL FACTORS: Fitness, Past/current experiences, Time since onset of injury/illness/exacerbation, and 3+ comorbidities: anemia, HTN, DM2 are also affecting patient's functional outcome.   REHAB POTENTIAL: Good  CLINICAL DECISION MAKING: Stable/uncomplicated  EVALUATION COMPLEXITY: Low   GOALS: Goals reviewed with patient? YES  SHORT TERM GOALS: Target date: 10/06/2024 Pt will be independent with administered HEP to demonstrate the competency necessary for long term managemnet of symptoms at home.  Baseline: Goal status: INITIAL  LONG TERM GOALS: Target date: 11/17/2024  Pt. Will achieve a LEFS score of 40/80 as to demonstrate improvement in self-perceived functional ability with daily activities.  Baseline: 24/80 Goal status: INITIAL  2.  Pt will improve Global foot/ankle strength to a 4+/5 to demonstrate improvement in strength for quality of motion and activity performance.  Baseline:  Goal status: INITIAL  3.  Pt will report pain levels improving during ADLs to be less than or equal to 4/10 as to demonstrate improved tolerance with daily functional activities such as driving and dressing.  Baseline:  Goal status: INITIAL  4.   Pt will independently ambulate 1051ft with no AD and less than 4/10 pain to demonstrate improved weightbearing tolerance, BLE strength, and functional capacity for community ambulation.  Baseline:  Goal status: INITIAL  --------------------------------------------------------------------------------------------- PLAN:  PT FREQUENCY: 1-2x/week  PT DURATION: 6 weeks  PLANNED INTERVENTIONS: 97110-Therapeutic exercises, 97530- Therapeutic activity, 97112- Neuromuscular re-education, 97535- Self Care, 02859- Manual therapy, [phone]- Gait training, Patient/Family education, Balance training, Stair training, Taping, Joint mobilization, Scar mobilization, DME instructions, Cryotherapy, and Moist  heat  PLAN FOR NEXT SESSION: Review HEP, Begin POC as detailed in the assessment   Mabel Kiang, PT, DPT 08/25/2024, 12:50 PM   Referring diagnosis?  M21.611 (ICD-10-CM) - Bunion, right foot  Z98.890 (ICD-10-CM) - Post-operative state  M20.41 (ICD-10-CM) - Hammertoe of right foot  M21.621 (ICD-10-CM) - Tailor's bunion of right foot   Treatment diagnosis? (if different than referring diagnosis) same What was this (referring dx) caused by? [x]  Surgery []  Fall []  Ongoing issue []  Arthritis []  Other: ____________  Laterality: [x]  Rt []  Lt []  Both  Check all possible CPT codes: 97110-Therapeutic exercises, 97530- Therapeutic activity, 97112- Neuromuscular re-education, 97535- Self Care, 02859- Manual therapy, [phone]- Gait training, Patient/Family education, Balance training, Stair training, Taping, Joint mobilization, Scar mobilization, DME instructions, Cryotherapy, and Moist heat

## 2024-08-25 ENCOUNTER — Ambulatory Visit: Attending: Podiatry | Admitting: Physical Therapy

## 2024-08-25 ENCOUNTER — Encounter: Payer: Self-pay | Admitting: Physical Therapy

## 2024-08-25 ENCOUNTER — Other Ambulatory Visit: Payer: Self-pay

## 2024-08-25 DIAGNOSIS — M6281 Muscle weakness (generalized): Secondary | ICD-10-CM | POA: Insufficient documentation

## 2024-08-25 DIAGNOSIS — M25571 Pain in right ankle and joints of right foot: Secondary | ICD-10-CM | POA: Insufficient documentation

## 2024-08-25 DIAGNOSIS — M25674 Stiffness of right foot, not elsewhere classified: Secondary | ICD-10-CM | POA: Insufficient documentation

## 2024-08-28 ENCOUNTER — Ambulatory Visit

## 2024-09-01 ENCOUNTER — Ambulatory Visit: Admitting: Physical Therapy

## 2024-09-02 ENCOUNTER — Other Ambulatory Visit: Payer: Self-pay | Admitting: Gastroenterology

## 2024-09-03 ENCOUNTER — Encounter

## 2024-09-07 ENCOUNTER — Encounter: Admitting: Podiatry

## 2024-09-08 ENCOUNTER — Ambulatory Visit

## 2024-09-08 NOTE — Progress Notes (Signed)
 Office Visit Note  Patient: [name]             Date of Birth: 1971/02/16           MRN: [id number]             PCP: Sun, Vyvyan, MD Referring: Sun, Vyvyan, MD Visit Date: 09/14/2024 Occupation: Data Unavailable  Subjective:  Fibromyalgia flare  History of Present Illness: [name] is a 53 y.o. female with a past medical history of undifferentiated connective tissue disease.  Patient was last seen in the office on 12/06/2023.  Patient was previously taking Plaquenil  200 mg 1 tablet by mouth twice daily Monday to Friday.  She has been off of Plaquenil  since June 2025.  Patient presents today experiencing symptoms of a fibromyalgia flare x 3 weeks.  She has been experiencing increased fatigue and myofascial pain.  She denies any joint swelling at this time.  She continues to experience intermittent sores in her nose and ears.  She has ongoing sicca symptoms. Patient was hospitalized in early July which she attributes to reflux as well as nausea and vomiting.  Patient states she was also experiencing constipation at that time.  Patient states that these GI symptoms have been better controlled. Patient continues to take tramadol  as needed for pain relief.  Patient states that her knee pain was better controlled while taking Plaquenil .  She would like to resume Plaquenil  as prescribed pending lab results.    Activities of Daily Living:  Patient reports morning stiffness for 1 hour.   Patient Reports nocturnal pain.  Difficulty dressing/grooming: Denies Difficulty climbing stairs: Reports Difficulty getting out of chair: Denies Difficulty using hands for taps, buttons, cutlery, and/or writing: Reports  Review of Systems  Constitutional:  Positive for fatigue.  HENT:  Positive for mouth dryness. Negative for mouth sores.   Eyes:  Negative for dryness.  Respiratory:  Positive for shortness of breath.   Cardiovascular:  Positive for chest pain and palpitations.   Gastrointestinal:  Negative for blood in stool, constipation and diarrhea.  Endocrine: Positive for increased urination.  Genitourinary:  Positive for involuntary urination.  Musculoskeletal:  Positive for joint pain, gait problem, joint pain, joint swelling, myalgias, muscle weakness, morning stiffness, muscle tenderness and myalgias.  Skin:  Positive for sensitivity to sunlight. Negative for color change, rash and hair loss.  Allergic/Immunologic: Negative for susceptible to infections.  Neurological:  Negative for dizziness and headaches.  Hematological:  Negative for swollen glands.  Psychiatric/Behavioral:  Positive for depressed mood and sleep disturbance. The patient is nervous/anxious.     PMFS History:  Patient Active Problem List   Diagnosis Date Noted   Nausea & vomiting 06/17/2024   HTN (hypertension) 06/17/2024   Hyperlipidemia 06/17/2024   DM (diabetes mellitus), type 2 (HCC) 06/17/2024   Dehydration 06/17/2024   Constipation 06/17/2024   S/P bunionectomy 06/17/2024   Depression 06/17/2024   Regurgitation of food 01/23/2023   Fibromyalgia 12/25/2016   Other fatigue 12/25/2016   Primary insomnia 12/25/2016   Primary osteoarthritis of both knees 12/25/2016   ANA positive 12/25/2016   History of gastroesophageal reflux (GERD) 12/25/2016   History of anemia 12/25/2016   Trochanteric bursitis of both hips 12/25/2016   Anemia, iron deficiency 04/18/2015   Dysphagia, pharyngoesophageal phase 11/14/2011   Esophageal reflux 11/14/2011    Past Medical History:  Diagnosis Date   ADD (attention deficit disorder with hyperactivity)    Allergic rhinitis    Anxiety  Arthritis    Depression    Diabetes mellitus without complication (HCC)    Dysmenorrhea    Fibromyalgia    GERD (gastroesophageal reflux disease)    H. pylori infection    per patient    HLD (hyperlipidemia)    HTN (hypertension)    Insomnia    Irritable bowel syndrome    Restless leg syndrome     Sleep apnea     Family History  Problem Relation Age of Onset   Breast cancer Maternal Grandmother    Diabetes Sister    Irritable bowel syndrome Sister    Heart disease Brother    Heart attack Brother    Stroke Mother    Heart disease Mother    Diabetes Sister    Irritable bowel syndrome Sister    Fibromyalgia Daughter    Colon cancer Neg Hx    Colon polyps Neg Hx    Esophageal cancer Neg Hx    Kidney disease Neg Hx    Gallbladder disease Neg Hx    Stomach cancer Neg Hx    Past Surgical History:  Procedure Laterality Date   arthroplasty of toe Right 06/09/2024   derotational arthroplasty right 5th toe   BUNIONECTOMY Left 2008   DILATION AND CURETTAGE OF UTERUS  2004   EAR CYST EXCISION Left 1983   ESOPHAGEAL MANOMETRY N/A 01/02/2023   Procedure: ESOPHAGEAL MANOMETRY (EM);  Surgeon: Legrand Victory LITTIE DOUGLAS, MD;  Location: [facility];  Service: Gastroenterology;  Laterality: N/A;   HAMMER TOE SURGERY Right 06/09/2024   2nd toe   KNEE ARTHROSCOPY Right    with knee cap adjustment   METATARSAL OSTEOTOMY WITH BUNIONECTOMY Right 06/09/2024   PH IMPEDANCE STUDY N/A 01/02/2023   Procedure: PH IMPEDANCE STUDY;  Surgeon: Legrand Victory LITTIE DOUGLAS, MD;  Location: [facility];  Service: Gastroenterology;  Laterality: N/A;   Tailor bunionectomy Right 06/09/2024   UPPER GASTROINTESTINAL ENDOSCOPY  06/29/2020   Social History   Tobacco Use   Smoking status: Former    Current packs/day: 0.00    Average packs/day: 0.1 packs/day for 12.0 years (1.2 ttl pk-yrs)    Types: Cigarettes    Start date: 12/27/1981    Quit date: 12/27/1993    Years since quitting: 30.7    Passive exposure: Never   Smokeless tobacco: Never  Vaping Use   Vaping status: Never Used  Substance Use Topics   Alcohol use: No   Drug use: No   Social History   Social History Narrative   Not on file     Immunization History  Administered Date(s) Administered   Fluzone Influenza virus vaccine,trivalent (IIV3),  split virus 09/01/2014, 09/26/2016, 10/03/2018   Influenza, Quadrivalent, Recombinant, Inj, Pf 09/21/2020, 09/12/2021   Influenza,inj,Quad PF,6+ Mos 10/03/2018   Influenza-Unspecified 09/12/2021   PFIZER(Purple Top)SARS-COV-2 Vaccination 03/11/2020, 04/01/2020, 12/08/2020   PNEUMOCOCCAL CONJUGATE-20 05/04/2022   Zoster, Live 05/04/2022     Objective: Vital Signs: BP (!) 153/89 (BP Location: Left Arm, Patient Position: Sitting, Cuff Size: Normal)   Pulse (!) 105   Temp 98.6 F (37 C)   Resp 16   Ht 5' 6 (1.676 m)   Wt 216 lb 6.4 oz (98.2 kg)   LMP 12/18/2019 (Approximate)   BMI 34.93 kg/m    Physical Exam Vitals and nursing note reviewed.  Constitutional:      Appearance: She is well-developed.  HENT:     Head: Normocephalic and atraumatic.  Eyes:     Conjunctiva/sclera: Conjunctivae normal.  Cardiovascular:  Rate and Rhythm: Normal rate and regular rhythm.     Heart sounds: Normal heart sounds.  Pulmonary:     Effort: Pulmonary effort is normal.     Breath sounds: Normal breath sounds.  Abdominal:     General: Bowel sounds are normal.     Palpations: Abdomen is soft.  Musculoskeletal:     Cervical back: Normal range of motion.  Lymphadenopathy:     Cervical: No cervical adenopathy.  Skin:    General: Skin is warm and dry.     Capillary Refill: Capillary refill takes less than 2 seconds.  Neurological:     Mental Status: She is alert and oriented to person, place, and time.  Psychiatric:        Behavior: Behavior normal.      Musculoskeletal Exam: Generalized hyperalgesia and positive tender points on exam.  C-spine, thoracic spine, lumbar spine have good range of motion.  Trapezius muscle tension and tenderness bilaterally.  No midline spinal tenderness.  No SI joint tenderness.  Shoulder joints, elbow joints, wrist joints, MCPs, PIPs, DIPs have good range of motion with no synovitis.  Complete fist formation bilaterally.  Hip joints have good range of motion  with no groin pain.  Knee joints have good range of motion no warmth or effusion.  Crepitus with range of motion of both knees.  Ankle joints have good range of motion no tenderness or joint swelling.  No evidence of Achilles tendinitis or plantar fasciitis.   CDAI Exam: CDAI Score: -- Patient Global: --; Provider Global: -- Swollen: --; Tender: -- Joint Exam 09/14/2024   No joint exam has been documented for this visit   There is currently no information documented on the homunculus. Go to the Rheumatology activity and complete the homunculus joint exam.  Investigation: No additional findings.  Imaging: No results found.   Recent Labs: Lab Results  Component Value Date   WBC 7.0 06/18/2024   HGB 12.6 06/18/2024   PLT 275 06/18/2024   NA 138 06/19/2024   K 3.4 (L) 06/19/2024   CL 102 06/19/2024   CO2 22 06/19/2024   GLUCOSE 113 (H) 06/19/2024   BUN <5 (L) 06/19/2024   CREATININE 0.74 06/19/2024   BILITOT 0.8 06/18/2024   ALKPHOS 72 06/18/2024   AST 38 06/18/2024   ALT 38 06/18/2024   PROT 7.4 06/18/2024   ALBUMIN 3.7 06/18/2024   CALCIUM  8.8 (L) 06/19/2024   GFRAA >60 12/28/2019    Speciality Comments: PLQ eye exam: 06/14/2023 normal @ [facility]. Follow up in 1 year.  Procedures:  No procedures performed Allergies: Lisinopril, Prednisone, and Methylphenidate    Assessment / Plan:     Visit Diagnoses: Undifferentiated connective tissue disease - 04/24/2023 : ANA 1: 80 NH, 1:1280 [place], centromere antibody positive, RNP positive-1.3, CRP WNL, Smith antibody neg, [place] antibodies neg, RF neg, anti-CCP neg: Patient has been off of Plaquenil  since June 2025 prior to undergoing surgical intervention followed by being admitted in early July due to reflux, nausea, and vomiting.  Patient presents today experiencing a flare of fibromyalgia.  She has been experiencing increased myofascial pain and fatigue for the past 3 weeks.  She has no synovitis on examination  today.  She continues to have occasional nasal ulcers and sores in both the ears.  She continues to have chronic sicca symptoms.  She has not had any recent rashes.  She has noticed increased skin dryness. She was last seen in the office  on 12/06/2023.  She was previously taking Plaquenil  200 mg 1 tablet by mouth twice daily Monday through Friday.  She was tolerating Plaquenil  without any side effects and would like to resume therapy.  Her knee joint pain improved while taking Plaquenil . Plan to obtain the following lab work today.  A new prescription for Plaquenil  be sent to the pharmacy pending lab results.  She is advised to notify us  if she develops any new or worsening symptoms.  She will follow-up in the office in 3 to 4 months or sooner if needed.  - Plan: CBC with Differential/Platelet, Protein / creatinine ratio, urine, ANA, Anti-DNA antibody, double-stranded, C3 and C4, Sedimentation rate, Comprehensive metabolic panel with GFR, RNP Antibody, Anti-Smith antibody  High risk medication use - Plaquenil : 200 mg 1 tablet by mouth daily Monday through Friday.  PLQ eye exam: 06/14/2023 normal @ [facility]. Follow up in 1 year.  Advised the patient to schedule an updated Plaquenil  eye examination. CBC and CMP updated on 06/18/24.  Orders for CBC and CMP were released today.  - Plan: CBC with Differential/Platelet, Comprehensive metabolic panel with GFR  Chronic pain of left knee: Chronic pain.  No warmth or effusion noted.  She will be resuming Plaquenil  as discussed above.  Primary osteoarthritis of both knees: Chronic pain. She has crepitus in both knees with range of motion.  Her knee joint pain was previously better controlled while taking Plaquenil .  No effusion was noted on examination today.  A refill of Plaquenil  be sent to the pharmacy pending lab results.  Trochanteric bursitis of both hips: Patient has tenderness palpation over bilateral trochanteric bursa.  Discussed the importance  of performing exercises daily.    H/O multiple pulmonary nodules - Dr. Kara. HRCT 05/17/2021 subpleural nodule left upper lobe.Chest CT 06/27/2022:New 5 mm part-solid pulmonary nodule LUL  Fibromyalgia: Patient presents today experiencing a fibromyalgia flare.  She has generalized hyperalgesia and positive tender points on exam.  She is also been experiencing increased fatigue.  She continues to take tramadol  as needed for pain relief.  She takes gabapentin 900 mg at bedtime.  Discussed the importance of regular exercise and good sleep hygiene.  Other fatigue: Increased fatigue for the past 3 weeks which she attributes to fibromyalgia flare.  Discussed the importance of regular exercise and good sleep hygiene.  Primary insomnia: She takes gabapentin 900 mg at bedtime.  Other medical conditions are listed as follows:  History of diabetes mellitus  Vitamin D  deficiency  Essential hypertension: Blood pressure was elevated today in the office and was rechecked prior to leaving.  Patient was advised to monitor blood pressure closely to reach out to PCP if blood pressure remains elevated.  History of gastroesophageal reflux (GERD)  History of anemia  Family history of morphea-Sister  Orders: Orders Placed This Encounter  Procedures   CBC with Differential/Platelet   Protein / creatinine ratio, urine   ANA   Anti-DNA antibody, double-stranded   C3 and C4   Sedimentation rate   Comprehensive metabolic panel with GFR   RNP Antibody   Anti-Smith antibody   No orders of the defined types were placed in this encounter.    Follow-Up Instructions: No follow-ups on file.   Olivia CHRISTELLA Craze, PA-C  Note - This record has been created using Dragon software.  Chart creation errors have been sought, but may not always  have been located. Such creation errors do not reflect on  the standard of medical care.

## 2024-09-10 ENCOUNTER — Ambulatory Visit: Admitting: Physical Therapy

## 2024-09-14 ENCOUNTER — Ambulatory Visit: Attending: Physician Assistant | Admitting: Physician Assistant

## 2024-09-14 ENCOUNTER — Encounter: Payer: Self-pay | Admitting: Physician Assistant

## 2024-09-14 VITALS — BP 153/89 | HR 105 | Temp 98.6°F | Resp 16 | Ht 66.0 in | Wt 216.4 lb

## 2024-09-14 DIAGNOSIS — I10 Essential (primary) hypertension: Secondary | ICD-10-CM

## 2024-09-14 DIAGNOSIS — M359 Systemic involvement of connective tissue, unspecified: Secondary | ICD-10-CM | POA: Diagnosis not present

## 2024-09-14 DIAGNOSIS — M7062 Trochanteric bursitis, left hip: Secondary | ICD-10-CM

## 2024-09-14 DIAGNOSIS — G8929 Other chronic pain: Secondary | ICD-10-CM

## 2024-09-14 DIAGNOSIS — F5101 Primary insomnia: Secondary | ICD-10-CM | POA: Diagnosis not present

## 2024-09-14 DIAGNOSIS — M25562 Pain in left knee: Secondary | ICD-10-CM

## 2024-09-14 DIAGNOSIS — M797 Fibromyalgia: Secondary | ICD-10-CM

## 2024-09-14 DIAGNOSIS — M170 Bilateral primary osteoarthritis of knee: Secondary | ICD-10-CM

## 2024-09-14 DIAGNOSIS — M7061 Trochanteric bursitis, right hip: Secondary | ICD-10-CM | POA: Diagnosis not present

## 2024-09-14 DIAGNOSIS — E559 Vitamin D deficiency, unspecified: Secondary | ICD-10-CM

## 2024-09-14 DIAGNOSIS — R5383 Other fatigue: Secondary | ICD-10-CM

## 2024-09-14 DIAGNOSIS — Z87898 Personal history of other specified conditions: Secondary | ICD-10-CM

## 2024-09-14 DIAGNOSIS — Z79899 Other long term (current) drug therapy: Secondary | ICD-10-CM | POA: Diagnosis not present

## 2024-09-14 DIAGNOSIS — Z840 Family history of diseases of the skin and subcutaneous tissue: Secondary | ICD-10-CM

## 2024-09-14 DIAGNOSIS — Z862 Personal history of diseases of the blood and blood-forming organs and certain disorders involving the immune mechanism: Secondary | ICD-10-CM

## 2024-09-14 DIAGNOSIS — Z8719 Personal history of other diseases of the digestive system: Secondary | ICD-10-CM

## 2024-09-14 DIAGNOSIS — Z8639 Personal history of other endocrine, nutritional and metabolic disease: Secondary | ICD-10-CM

## 2024-09-15 ENCOUNTER — Ambulatory Visit: Payer: Self-pay | Admitting: Physician Assistant

## 2024-09-15 DIAGNOSIS — M359 Systemic involvement of connective tissue, unspecified: Secondary | ICD-10-CM

## 2024-09-15 DIAGNOSIS — Z79899 Other long term (current) drug therapy: Secondary | ICD-10-CM

## 2024-09-15 MED ORDER — HYDROXYCHLOROQUINE SULFATE 200 MG PO TABS: ORAL_TABLET | ORAL | 0 refills | Status: DC

## 2024-09-15 NOTE — Progress Notes (Signed)
 ESR WNL Glucose is elevated-159, rest of CMP WNL CBC stable.  Ok to refill plaquenil 

## 2024-09-16 LAB — COMPREHENSIVE METABOLIC PANEL WITH GFR
AG Ratio: 1.2 (calc) (ref 1.0–2.5)
ALT: 20 U/L (ref 6–29)
AST: 15 U/L (ref 10–35)
Albumin: 4.3 g/dL (ref 3.6–5.1)
Alkaline phosphatase (APISO): 127 U/L (ref 37–153)
BUN: 8 mg/dL (ref 7–25)
CO2: 25 mmol/L (ref 20–32)
Calcium: 9.4 mg/dL (ref 8.6–10.4)
Chloride: 103 mmol/L (ref 98–110)
Creat: 0.93 mg/dL (ref 0.50–1.03)
Globulin: 3.7 g/dL (ref 1.9–3.7)
Glucose, Bld: 158 mg/dL — ABNORMAL HIGH (ref 65–99)
Potassium: 3.6 mmol/L (ref 3.5–5.3)
Sodium: 141 mmol/L (ref 135–146)
Total Bilirubin: 0.4 mg/dL (ref 0.2–1.2)
Total Protein: 8 g/dL (ref 6.1–8.1)
eGFR: 73 mL/min/1.73m2 (ref >=60)

## 2024-09-16 LAB — CBC WITH DIFFERENTIAL/PLATELET
Absolute Lymphocytes: 2939 {cells}/uL (ref 850–3900)
Absolute Monocytes: 474 {cells}/uL (ref 200–950)
Basophils Absolute: 32 {cells}/uL (ref 0–200)
Basophils Relative: 0.4 %
Eosinophils Absolute: 8 {cells}/uL — ABNORMAL LOW (ref 15–500)
Eosinophils Relative: 0.1 %
HCT: 43.3 % (ref 35.0–45.0)
Hemoglobin: 14.5 g/dL (ref 11.7–15.5)
MCH: 27.7 pg (ref 27.0–33.0)
MCHC: 33.5 g/dL (ref 32.0–36.0)
MCV: 82.8 fL (ref 80.0–100.0)
MPV: 8.8 fL (ref 7.5–12.5)
Monocytes Relative: 6 %
Neutro Abs: 4448 {cells}/uL (ref 1500–7800)
Neutrophils Relative %: 56.3 %
Platelets: 352 Thousand/uL (ref 140–400)
RBC: 5.23 Million/uL — ABNORMAL HIGH (ref 3.80–5.10)
RDW: 15.6 % — ABNORMAL HIGH (ref 11.0–15.0)
Total Lymphocyte: 37.2 %
WBC: 7.9 Thousand/uL (ref 3.8–10.8)

## 2024-09-16 LAB — ANTI-NUCLEAR AB-TITER (ANA TITER)
ANA TITER: 1:320 {titer} — ABNORMAL HIGH
ANA TITER: 1:40 {titer} — ABNORMAL HIGH
ANA Titer 1: 1:640 {titer} — ABNORMAL HIGH

## 2024-09-16 LAB — C3 AND C4
C3 Complement: 227 mg/dL — ABNORMAL HIGH (ref 83–193)
C4 Complement: 33 mg/dL (ref 15–57)

## 2024-09-16 LAB — ANTI-DNA ANTIBODY, DOUBLE-STRANDED: ds DNA Ab: <1 [IU]/mL

## 2024-09-16 LAB — RNP ANTIBODY: Ribonucleic Protein(ENA) Antibody, IgG: 1.5 AI — AB

## 2024-09-16 LAB — ANA: Anti Nuclear Antibody (ANA): POSITIVE — AB

## 2024-09-16 LAB — ANTI-SMITH ANTIBODY: ENA SM Ab Ser-aCnc: <1 AI

## 2024-09-16 LAB — SEDIMENTATION RATE: Sed Rate: 25 mm/h (ref 0–30)

## 2024-09-16 NOTE — Progress Notes (Signed)
 RNP remains positive.  dsDNA negative  Smith antibody negative

## 2024-09-17 NOTE — Progress Notes (Signed)
 ANA remains positive.

## 2024-09-21 ENCOUNTER — Encounter: Admitting: Podiatry

## 2024-09-28 DIAGNOSIS — Z79899 Other long term (current) drug therapy: Secondary | ICD-10-CM | POA: Diagnosis not present

## 2024-09-28 DIAGNOSIS — M359 Systemic involvement of connective tissue, unspecified: Secondary | ICD-10-CM | POA: Diagnosis not present

## 2024-09-28 DIAGNOSIS — Z1231 Encounter for screening mammogram for malignant neoplasm of breast: Secondary | ICD-10-CM | POA: Diagnosis not present

## 2024-09-29 LAB — PROTEIN / CREATININE RATIO, URINE
Creatinine, Urine: 193 mg/dL (ref 20–275)
Protein/Creat Ratio: 192 mg/g{creat} — ABNORMAL HIGH (ref 24–184)
Protein/Creatinine Ratio: 0.192 mg/mg{creat} — ABNORMAL HIGH (ref 0.024–0.184)
Total Protein, Urine: 37 mg/dL — ABNORMAL HIGH (ref 5–24)

## 2024-09-29 NOTE — Progress Notes (Signed)
 Urine protein creatinine ratio is elevated-recommend rechecking in 3-4 weeks.

## 2024-10-02 DIAGNOSIS — Z23 Encounter for immunization: Secondary | ICD-10-CM | POA: Diagnosis not present

## 2024-10-02 DIAGNOSIS — F902 Attention-deficit hyperactivity disorder, combined type: Secondary | ICD-10-CM | POA: Diagnosis not present

## 2024-10-02 DIAGNOSIS — E559 Vitamin D deficiency, unspecified: Secondary | ICD-10-CM | POA: Diagnosis not present

## 2024-10-02 DIAGNOSIS — M797 Fibromyalgia: Secondary | ICD-10-CM | POA: Diagnosis not present

## 2024-10-02 DIAGNOSIS — I10 Essential (primary) hypertension: Secondary | ICD-10-CM | POA: Diagnosis not present

## 2024-10-02 DIAGNOSIS — F331 Major depressive disorder, recurrent, moderate: Secondary | ICD-10-CM | POA: Diagnosis not present

## 2024-10-02 DIAGNOSIS — G4700 Insomnia, unspecified: Secondary | ICD-10-CM | POA: Diagnosis not present

## 2024-10-02 DIAGNOSIS — E1140 Type 2 diabetes mellitus with diabetic neuropathy, unspecified: Secondary | ICD-10-CM | POA: Diagnosis not present

## 2024-10-02 DIAGNOSIS — E7800 Pure hypercholesterolemia, unspecified: Secondary | ICD-10-CM | POA: Diagnosis not present

## 2024-10-02 DIAGNOSIS — Z0000 Encounter for general adult medical examination without abnormal findings: Secondary | ICD-10-CM | POA: Diagnosis not present

## 2024-10-13 ENCOUNTER — Ambulatory Visit (INDEPENDENT_AMBULATORY_CARE_PROVIDER_SITE_OTHER): Admitting: Podiatry

## 2024-10-13 DIAGNOSIS — M792 Neuralgia and neuritis, unspecified: Secondary | ICD-10-CM | POA: Diagnosis not present

## 2024-10-13 DIAGNOSIS — M79671 Pain in right foot: Secondary | ICD-10-CM | POA: Diagnosis not present

## 2024-10-13 DIAGNOSIS — L910 Hypertrophic scar: Secondary | ICD-10-CM

## 2024-10-13 MED ORDER — ONDANSETRON HCL 4 MG PO TABS: 4.0000 mg | ORAL_TABLET | Freq: Three times a day (TID) | ORAL | 0 refills | Status: AC | PRN

## 2024-10-13 NOTE — Progress Notes (Signed)
 Chief Complaint  Patient presents with   Routine Post Op    DOS 06/09/2024 A1C was 6.5 in Oct, No anti coag. Right foot bunion po. Reports that she is still having sharp pain across the top of the hallux and burning on the bottom of her foot, she also started that the toe is numb as well. I see no abnormal swelling.    Discussed the use of AI scribe software for clinical note transcription with the patient, who gave verbal consent to proceed. History of Present Illness [name] is a 53 year old female who presents with post-surgical scar tissue and associated pain.  Date of surgery was 06/09/2024.  She underwent a right bunionectomy with double osteotomy as well as a right second hammertoe and right fifth toe surgery.  She underwent surgery in early summer to late spring and has since experienced thickening across the scars. She manages this by massaging the area. She attended one physical therapy session at a local facility but was unable to receive ultrasound treatment as it was not offered.  She experiences sharp, knife-like pain that wakes her from sleep, describing it as feeling like 'something's sticking my skin.' This pain occurs in the area where most of the surgical work was done. Additionally, she experiences numbness on the top of the affected area and reports tightness along the scar.  She also reports nausea associated with the pain, for which she previously used Zofran  (ondansetron ) but has run out of the medication. She is currently taking gabapentin and tramadol  for pain management, although she notes that tramadol  is not effective for her fibromyalgia.  She has not used silicone scar sheets before. Her activity level is described as relaxed, and she experiences discomfort when the scar tissue is touched or rubbed.   Past Medical History:  Diagnosis Date   ADD (attention deficit disorder with hyperactivity)    Allergic rhinitis    Anxiety    Arthritis     Depression    Diabetes mellitus without complication (HCC)    Dysmenorrhea    Fibromyalgia    GERD (gastroesophageal reflux disease)    H. pylori infection    per patient    HLD (hyperlipidemia)    HTN (hypertension)    Insomnia    Irritable bowel syndrome    Restless leg syndrome    Sleep apnea    Past Surgical History:  Procedure Laterality Date   arthroplasty of toe Right 06/09/2024   derotational arthroplasty right 5th toe   BUNIONECTOMY Left 2008   DILATION AND CURETTAGE OF UTERUS  2004   EAR CYST EXCISION Left 1983   ESOPHAGEAL MANOMETRY N/A 01/02/2023   Procedure: ESOPHAGEAL MANOMETRY (EM);  Surgeon: Legrand Victory LITTIE DOUGLAS, MD;  Location: [facility];  Service: Gastroenterology;  Laterality: N/A;   HAMMER TOE SURGERY Right 06/09/2024   2nd toe   KNEE ARTHROSCOPY Right    with knee cap adjustment   METATARSAL OSTEOTOMY WITH BUNIONECTOMY Right 06/09/2024   PH IMPEDANCE STUDY N/A 01/02/2023   Procedure: PH IMPEDANCE STUDY;  Surgeon: Legrand Victory LITTIE DOUGLAS, MD;  Location: [facility];  Service: Gastroenterology;  Laterality: N/A;   Tailor bunionectomy Right 06/09/2024   UPPER GASTROINTESTINAL ENDOSCOPY  06/29/2020   Allergies  Allergen Reactions   Lisinopril Cough   Prednisone Other (See Comments)    Rapid heartbeat   Methylphenidate Palpitations   Physical Exam Palpable pedal pulse right foot.  Minimal to no localized edema to the right forefoot.  There  is hypertrophy of each incision/scar.  There is some deeper scar tissue/adhesions noted as there is some restriction on plantarflexion of the 1st, 2nd and 5th toes.  Decreased sensation to the dorsal aspect of the hallux just proximal to the eponychium.  Epicritic sensation is intact to the hallux toe pulp.  No crepitus noted with range of motion.  Pain on palpation noted to the dorsal first MPJ and dorsal second MPJ.  Toes are in corrected position and maintained.   Results   Assessment/Plan of Care: 1. Neuralgia   2.  Hypertrophic scar of skin   3. Pain in right foot      Meds ordered this encounter  Medications   ondansetron  (ZOFRAN ) 4 MG tablet    Sig: Take 1 tablet (4 mg total) by mouth every 8 (eight) hours as needed for nausea or vomiting.    Dispense:  20 tablet    Refill:  0   AMB REFERRAL TO PHYSICAL THERAPY  Assessment and Plan Assessment & Plan Right foot post-operative state following bunion and hammertoe repair with scar tissue formation and neuropathic pain Post-operative state with scar tissue formation and neuropathic pain. Scar tissue is causing tightness and binding, leading to neuropathic pain described as a knife or hot poker sensation, particularly at night. Numbness is present on the top of the foot. Current management includes massage and gabapentin, but tramadol  is ineffective. Ultrasound therapy is preferred over cortisone injections due to the length of the scars and potential discomfort. - Recommended silicone scar sheets for 30 days at bedtime to manage scar tissue. - Will arrange for physical therapy with ultrasound therapy to address scar tissue and neuropathic pain. - Continue gabapentin for neuropathic pain management. - Refilled ondansetron  for nausea management.  Nausea Associated with neuropathic pain episodes, particularly at night. - Refilled ondansetron  for nausea management.     Awanda CHARM Imperial, DPM, FACFAS [facility]     [address] [place], [place] [address]                Office [phone]  Fax [phone]

## 2024-10-19 ENCOUNTER — Encounter: Payer: Self-pay | Admitting: Radiology

## 2024-11-06 DIAGNOSIS — G720 Drug-induced myopathy: Secondary | ICD-10-CM | POA: Diagnosis not present

## 2024-11-06 DIAGNOSIS — Z79890 Hormone replacement therapy (postmenopausal): Secondary | ICD-10-CM | POA: Diagnosis not present

## 2024-11-06 DIAGNOSIS — E7800 Pure hypercholesterolemia, unspecified: Secondary | ICD-10-CM | POA: Diagnosis not present

## 2024-11-06 DIAGNOSIS — M797 Fibromyalgia: Secondary | ICD-10-CM | POA: Diagnosis not present

## 2024-11-06 DIAGNOSIS — N951 Menopausal and female climacteric states: Secondary | ICD-10-CM | POA: Diagnosis not present

## 2024-11-06 DIAGNOSIS — E1140 Type 2 diabetes mellitus with diabetic neuropathy, unspecified: Secondary | ICD-10-CM | POA: Diagnosis not present

## 2024-11-23 ENCOUNTER — Ambulatory Visit: Admitting: Podiatry

## 2024-12-17 ENCOUNTER — Other Ambulatory Visit: Payer: Self-pay | Admitting: Physician Assistant

## 2024-12-18 NOTE — Telephone Encounter (Signed)
 Last Fill: 09/15/2024  Eye exam: 06/14/2023 normal   Labs: 09/14/2024 ESR WNL Glucose is elevated-159, rest of CMP WNL CBC stable.  RNP remains positive.  dsDNA negative  Smith antibody negative  ANA remains positive  Urine protein creatinine ratio is elevated-recommend rechecking in 3-4 weeks.   Next Visit: 01/14/2025  Last Visit: 09/14/2024  IK:Lwipqqzmzwupjuzi connective tissue disease   Current Dose per office note 09/14/2024: Plaquenil : 200 mg 1 tablet by mouth daily Monday through Friday.   Contacted the patient and advised she is due to update her PLQ eye exam. Patient states she will schedule one for this month and call us  back with the date.   Okay to refill Plaquenil ?

## 2025-01-04 NOTE — Progress Notes (Unsigned)
 "  Office Visit Note  Patient: [name]             Date of Birth: Nov 24, 1971           MRN: [id number]             PCP: Sun, Vyvyan, MD Referring: Sun, Vyvyan, MD Visit Date: 01/14/2025 Occupation: Data Unavailable  Subjective:  No chief complaint on file.   History of Present Illness: [name] is a 54 y.o. female ***     Activities of Daily Living:  Patient reports morning stiffness for *** {minute/hour:19697}.   Patient {ACTIONS;DENIES/REPORTS:21021675::Denies} nocturnal pain.  Difficulty dressing/grooming: {ACTIONS;DENIES/REPORTS:21021675::Denies} Difficulty climbing stairs: {ACTIONS;DENIES/REPORTS:21021675::Denies} Difficulty getting out of chair: {ACTIONS;DENIES/REPORTS:21021675::Denies} Difficulty using hands for taps, buttons, cutlery, and/or writing: {ACTIONS;DENIES/REPORTS:21021675::Denies}  No Rheumatology ROS completed.   PMFS History:  Patient Active Problem List   Diagnosis Date Noted   Nausea & vomiting 06/17/2024   HTN (hypertension) 06/17/2024   Hyperlipidemia 06/17/2024   DM (diabetes mellitus), type 2 (HCC) 06/17/2024   Dehydration 06/17/2024   Constipation 06/17/2024   S/P bunionectomy 06/17/2024   Depression 06/17/2024   Regurgitation of food 01/23/2023   Fibromyalgia 12/25/2016   Other fatigue 12/25/2016   Primary insomnia 12/25/2016   Primary osteoarthritis of both knees 12/25/2016   ANA positive 12/25/2016   History of gastroesophageal reflux (GERD) 12/25/2016   History of anemia 12/25/2016   Trochanteric bursitis of both hips 12/25/2016   Anemia, iron deficiency 04/18/2015   Dysphagia, pharyngoesophageal phase 11/14/2011   Esophageal reflux 11/14/2011    Past Medical History:  Diagnosis Date   ADD (attention deficit disorder with hyperactivity)    Allergic rhinitis    Anxiety    Arthritis    Depression    Diabetes mellitus without complication (HCC)    Dysmenorrhea    Fibromyalgia    GERD  (gastroesophageal reflux disease)    H. pylori infection    per patient    HLD (hyperlipidemia)    HTN (hypertension)    Insomnia    Irritable bowel syndrome    Restless leg syndrome    Sleep apnea     Family History  Problem Relation Age of Onset   Breast cancer Maternal Grandmother    Diabetes Sister    Irritable bowel syndrome Sister    Heart disease Brother    Heart attack Brother    Stroke Mother    Heart disease Mother    Diabetes Sister    Irritable bowel syndrome Sister    Fibromyalgia Daughter    Colon cancer Neg Hx    Colon polyps Neg Hx    Esophageal cancer Neg Hx    Kidney disease Neg Hx    Gallbladder disease Neg Hx    Stomach cancer Neg Hx    Past Surgical History:  Procedure Laterality Date   arthroplasty of toe Right 06/09/2024   derotational arthroplasty right 5th toe   BUNIONECTOMY Left 2008   DILATION AND CURETTAGE OF UTERUS  2004   EAR CYST EXCISION Left 1983   ESOPHAGEAL MANOMETRY N/A 01/02/2023   Procedure: ESOPHAGEAL MANOMETRY (EM);  Surgeon: Legrand Victory LITTIE DOUGLAS, MD;  Location: [facility];  Service: Gastroenterology;  Laterality: N/A;   HAMMER TOE SURGERY Right 06/09/2024   2nd toe   KNEE ARTHROSCOPY Right    with knee cap adjustment   METATARSAL OSTEOTOMY WITH BUNIONECTOMY Right 06/09/2024   PH IMPEDANCE STUDY N/A 01/02/2023   Procedure: PH IMPEDANCE STUDY;  Surgeon: Legrand Victory LITTIE  III, MD;  Location: [facility];  Service: Gastroenterology;  Laterality: N/A;   Tailor bunionectomy Right 06/09/2024   UPPER GASTROINTESTINAL ENDOSCOPY  06/29/2020   Social History[1] Social History   Social History Narrative   Not on file     Immunization History  Administered Date(s) Administered   Fluzone Influenza virus vaccine,trivalent (IIV3), split virus 09/01/2014, 09/26/2016, 10/03/2018   Influenza, Quadrivalent, Recombinant, Inj, Pf 09/21/2020, 09/12/2021   Influenza,inj,Quad PF,6+ Mos 10/03/2018   Influenza-Unspecified 09/12/2021    PFIZER(Purple Top)SARS-COV-2 Vaccination 03/11/2020, 04/01/2020, 12/08/2020   PNEUMOCOCCAL CONJUGATE-20 05/04/2022   Zoster, Live 05/04/2022     Objective: Vital Signs: LMP 12/18/2019    Physical Exam   Musculoskeletal Exam: ***  CDAI Exam: CDAI Score: -- Patient Global: --; Provider Global: -- Swollen: --; Tender: -- Joint Exam 01/14/2025   No joint exam has been documented for this visit   There is currently no information documented on the homunculus. Go to the Rheumatology activity and complete the homunculus joint exam.  Investigation: No additional findings.  Imaging: No results found.  Recent Labs: Lab Results  Component Value Date   WBC 7.9 09/14/2024   HGB 14.5 09/14/2024   PLT 352 09/14/2024   NA 141 09/14/2024   K 3.6 09/14/2024   CL 103 09/14/2024   CO2 25 09/14/2024   GLUCOSE 158 (H) 09/14/2024   BUN 8 09/14/2024   CREATININE 0.93 09/14/2024   BILITOT 0.4 09/14/2024   ALKPHOS 72 06/18/2024   AST 15 09/14/2024   ALT 20 09/14/2024   PROT 8.0 09/14/2024   ALBUMIN 3.7 06/18/2024   CALCIUM  9.4 09/14/2024   GFRAA >60 12/28/2019    Speciality Comments: PLQ eye exam: 06/14/2023 normal @ [facility]. Follow up in 1 year.  Patient to schedule eye exam for this month and call the office back with the date.   Procedures:  No procedures performed Allergies: Lisinopril, Prednisone, and Methylphenidate   Assessment / Plan:     Visit Diagnoses: No diagnosis found.  Orders: No orders of the defined types were placed in this encounter.  No orders of the defined types were placed in this encounter.   Face-to-face time spent with patient was *** minutes. Greater than 50% of time was spent in counseling and coordination of care.  Follow-Up Instructions: No follow-ups on file.   [name], RT  Note - This record has been created using [employer].  Chart creation errors have been sought, but may not always  have been located. Such  creation errors do not reflect on  the standard of medical care.    [1]  Social History Tobacco Use   Smoking status: Former    Current packs/day: 0.00    Average packs/day: 0.1 packs/day for 12.0 years (1.2 ttl pk-yrs)    Types: Cigarettes    Start date: 12/27/1981    Quit date: 12/27/1993    Years since quitting: 31.0    Passive exposure: Never   Smokeless tobacco: Never  Vaping Use   Vaping status: Never Used  Substance Use Topics   Alcohol use: No   Drug use: No   "

## 2025-01-14 ENCOUNTER — Ambulatory Visit: Admitting: Physician Assistant

## 2025-01-14 DIAGNOSIS — R5383 Other fatigue: Secondary | ICD-10-CM

## 2025-01-14 DIAGNOSIS — G8929 Other chronic pain: Secondary | ICD-10-CM

## 2025-01-14 DIAGNOSIS — E559 Vitamin D deficiency, unspecified: Secondary | ICD-10-CM

## 2025-01-14 DIAGNOSIS — M797 Fibromyalgia: Secondary | ICD-10-CM

## 2025-01-14 DIAGNOSIS — Z87898 Personal history of other specified conditions: Secondary | ICD-10-CM

## 2025-01-14 DIAGNOSIS — F5101 Primary insomnia: Secondary | ICD-10-CM

## 2025-01-14 DIAGNOSIS — Z840 Family history of diseases of the skin and subcutaneous tissue: Secondary | ICD-10-CM

## 2025-01-14 DIAGNOSIS — M7061 Trochanteric bursitis, right hip: Secondary | ICD-10-CM

## 2025-01-14 DIAGNOSIS — Z8639 Personal history of other endocrine, nutritional and metabolic disease: Secondary | ICD-10-CM

## 2025-01-14 DIAGNOSIS — I10 Essential (primary) hypertension: Secondary | ICD-10-CM

## 2025-01-14 DIAGNOSIS — M359 Systemic involvement of connective tissue, unspecified: Secondary | ICD-10-CM

## 2025-01-14 DIAGNOSIS — Z8719 Personal history of other diseases of the digestive system: Secondary | ICD-10-CM

## 2025-01-14 DIAGNOSIS — Z79899 Other long term (current) drug therapy: Secondary | ICD-10-CM

## 2025-01-14 DIAGNOSIS — Z862 Personal history of diseases of the blood and blood-forming organs and certain disorders involving the immune mechanism: Secondary | ICD-10-CM

## 2025-01-14 DIAGNOSIS — M170 Bilateral primary osteoarthritis of knee: Secondary | ICD-10-CM

## 2025-03-01 ENCOUNTER — Ambulatory Visit: Admitting: Physician Assistant
